# Patient Record
Sex: Male | Born: 1950 | Race: White | Hispanic: No | Marital: Married | Smoking: Never smoker
Health system: Southern US, Community
[De-identification: ages and names within clinical notes are randomized; demographics above are authoritative.]

## PROBLEM LIST (undated history)

## (undated) DIAGNOSIS — G4730 Sleep apnea, unspecified: Secondary | ICD-10-CM

## (undated) DIAGNOSIS — E119 Type 2 diabetes mellitus without complications: Secondary | ICD-10-CM

## (undated) DIAGNOSIS — I2510 Atherosclerotic heart disease of native coronary artery without angina pectoris: Secondary | ICD-10-CM

## (undated) DIAGNOSIS — I10 Essential (primary) hypertension: Secondary | ICD-10-CM

## (undated) DIAGNOSIS — K5790 Diverticulosis of intestine, part unspecified, without perforation or abscess without bleeding: Secondary | ICD-10-CM

## (undated) DIAGNOSIS — Z87442 Personal history of urinary calculi: Secondary | ICD-10-CM

## (undated) DIAGNOSIS — I6529 Occlusion and stenosis of unspecified carotid artery: Secondary | ICD-10-CM

## (undated) DIAGNOSIS — E785 Hyperlipidemia, unspecified: Secondary | ICD-10-CM

## (undated) DIAGNOSIS — K219 Gastro-esophageal reflux disease without esophagitis: Secondary | ICD-10-CM

## (undated) DIAGNOSIS — N2889 Other specified disorders of kidney and ureter: Secondary | ICD-10-CM

## (undated) HISTORY — DX: Type 2 diabetes mellitus without complications: E11.9

## (undated) HISTORY — DX: Occlusion and stenosis of unspecified carotid artery: I65.29

## (undated) HISTORY — PX: EYE SURGERY: SHX253

## (undated) HISTORY — DX: Gastro-esophageal reflux disease without esophagitis: K21.9

## (undated) HISTORY — PX: OTHER SURGICAL HISTORY: SHX169

## (undated) HISTORY — DX: Sleep apnea, unspecified: G47.30

## (undated) HISTORY — DX: Essential (primary) hypertension: I10

## (undated) HISTORY — PX: TONSILLECTOMY: SUR1361

## (undated) HISTORY — DX: Hyperlipidemia, unspecified: E78.5

---

## 2009-11-15 ENCOUNTER — Ambulatory Visit: Payer: Self-pay | Admitting: Specialist

## 2009-11-20 ENCOUNTER — Ambulatory Visit: Payer: Self-pay | Admitting: Family Medicine

## 2014-02-14 DIAGNOSIS — E1165 Type 2 diabetes mellitus with hyperglycemia: Secondary | ICD-10-CM | POA: Insufficient documentation

## 2014-02-14 DIAGNOSIS — IMO0002 Reserved for concepts with insufficient information to code with codable children: Secondary | ICD-10-CM | POA: Insufficient documentation

## 2014-02-14 DIAGNOSIS — E119 Type 2 diabetes mellitus without complications: Secondary | ICD-10-CM | POA: Insufficient documentation

## 2014-02-14 DIAGNOSIS — E785 Hyperlipidemia, unspecified: Secondary | ICD-10-CM | POA: Insufficient documentation

## 2014-02-14 DIAGNOSIS — I10 Essential (primary) hypertension: Secondary | ICD-10-CM | POA: Insufficient documentation

## 2016-05-07 ENCOUNTER — Other Ambulatory Visit: Payer: Self-pay | Admitting: Otolaryngology

## 2016-05-07 ENCOUNTER — Ambulatory Visit
Admission: RE | Admit: 2016-05-07 | Discharge: 2016-05-07 | Disposition: A | Discharge status: Home / Self Care | Payer: Self-pay | Source: Ambulatory Visit | Attending: Otolaryngology | Admitting: Otolaryngology

## 2016-05-07 DIAGNOSIS — R918 Other nonspecific abnormal finding of lung field: Secondary | ICD-10-CM | POA: Insufficient documentation

## 2016-05-07 DIAGNOSIS — R059 Cough, unspecified: Secondary | ICD-10-CM

## 2016-05-07 DIAGNOSIS — R05 Cough: Secondary | ICD-10-CM | POA: Insufficient documentation

## 2017-03-25 ENCOUNTER — Ambulatory Visit (INDEPENDENT_AMBULATORY_CARE_PROVIDER_SITE_OTHER): Payer: Self-pay | Admitting: Urology

## 2017-03-25 ENCOUNTER — Encounter: Payer: Self-pay | Admitting: Urology

## 2017-03-25 VITALS — BP 191/87 | HR 81 | Ht 68.0 in | Wt 174.0 lb

## 2017-03-25 DIAGNOSIS — N50811 Right testicular pain: Secondary | ICD-10-CM

## 2017-03-25 DIAGNOSIS — Z87442 Personal history of urinary calculi: Secondary | ICD-10-CM

## 2017-03-25 DIAGNOSIS — R109 Unspecified abdominal pain: Secondary | ICD-10-CM

## 2017-03-25 LAB — URINALYSIS, COMPLETE
Bilirubin, UA: NEGATIVE
Glucose, UA: NEGATIVE
Ketones, UA: NEGATIVE
LEUKOCYTES UA: NEGATIVE
Nitrite, UA: NEGATIVE
PH UA: 5.5 (ref 5.0–7.5)
PROTEIN UA: NEGATIVE
RBC, UA: NEGATIVE
Specific Gravity, UA: 1.015 (ref 1.005–1.030)
Urobilinogen, Ur: 0.2 mg/dL (ref 0.2–1.0)

## 2017-03-25 NOTE — Progress Notes (Signed)
03/25/2017 4:26 PM   Marcus Huang 12/28/1950 [id number]  Referring provider: Sofie Hartigan, MD [name], [name] [address]  Chief Complaint  Patient presents with  . Nephrolithiasis    New patient    HPI: 66 year old male who presents today with right flank pain. He is a personal history of nephrolithiasis and over the past 6 months, has spontaneously passed 2 stones.  He passed one small stone in January and another one in May. Since stone in May, he continued to have intermittent right lower quadrant pain, and right testicular discomfort which comes and goes. He takes may have another stone.  Prior to this, his last stone episode was approximately 10 years ago. His previously followed by a urologist but never required surgical intervention for stones.  He has no recent imaging available.  He denies any urinary symptoms including no dysuria, gross hematuria, frequency, or urgency. No fevers or chills.  Today, his discomfort is minimal.   PMH: Past Medical History:  Diagnosis Date  . Diabetes ([place])   . GERD (gastroesophageal reflux disease)   . Hypertension   . Sleep apnea     Surgical History: Past Surgical History:  Procedure Laterality Date  . none      Home Medications:  Allergies as of 03/25/2017   Not on File     Medication List       Accurate as of 03/25/17  4:26 PM. Always use your most recent med list.          aspirin EC 81 MG tablet Take by mouth.   traMADol 50 MG tablet Commonly known as:  ULTRAM Take by mouth.   valsartan-hydrochlorothiazide 160-12.5 MG tablet Commonly known as:  DIOVAN-HCT Take by mouth.       Allergies: Allergies not on file  Family History: Family History  Problem Relation Age of Onset  . Prostate cancer Neg Hx   . Bladder Cancer Neg Hx     Social History:  reports that he has never smoked. He has never used smokeless tobacco. He reports that he does not drink alcohol or use  drugs.  ROS: UROLOGY Frequent Urination?: No Hard to postpone urination?: No Burning/pain with urination?: No Get up at night to urinate?: No Leakage of urine?: No Urine stream starts and stops?: No Trouble starting stream?: No Do you have to strain to urinate?: No Blood in urine?: No Urinary tract infection?: No Sexually transmitted disease?: No Injury to kidneys or bladder?: Yes Painful intercourse?: No Weak stream?: No Erection problems?: No Penile pain?: No  Gastrointestinal Nausea?: No Vomiting?: No Indigestion/heartburn?: No Diarrhea?: No Constipation?: No  Constitutional Fever: No Night sweats?: No Weight loss?: No Fatigue?: No  Skin Skin rash/lesions?: No Itching?: No  Eyes Blurred vision?: No Double vision?: No  Ears/Nose/Throat Sore throat?: No Sinus problems?: No  Hematologic/Lymphatic Swollen glands?: No Easy bruising?: No  Cardiovascular Leg swelling?: No Chest pain?: No  Respiratory Cough?: No Shortness of breath?: No  Endocrine Excessive thirst?: No  Musculoskeletal Back pain?: No Joint pain?: No  Neurological Headaches?: No Dizziness?: No  Psychologic Depression?: No Anxiety?: No  Physical Exam: BP (!) 191/87   Pulse 81   Ht 5\' 8"  (1.727 m)   Wt 174 lb (78.9 kg)   BMI 26.46 kg/m   Constitutional:  Alert and oriented, No acute distress. HEENT: [place] AT, moist mucus membranes.  Trachea midline, no masses. Cardiovascular: No clubbing, cyanosis, or edema. Respiratory: Normal respiratory effort, no increased work  of breathing. GI: Abdomen is soft, nontender, nondistended, no abdominal masses GU: No CVA tenderness.  Normal circumcised phallus.  Bilateral descended testicles, no masses, nontender.   Skin: No rashes, bruises or suspicious lesions. Neurologic: Grossly intact, no focal deficits, moving all 4 extremities. Psychiatric: Normal mood and affect.  Laboratory Data:   Urinalysis Results for orders placed or  performed in visit on 03/25/17  Urinalysis, Complete  Result Value Ref Range   Specific Gravity, UA 1.015 1.005 - 1.030   pH, UA 5.5 5.0 - 7.5   Color, UA Yellow Yellow   Appearance Ur Clear Clear   Leukocytes, UA Negative Negative   Protein, UA Negative Negative/Trace   Glucose, UA Negative Negative   Ketones, UA Negative Negative   RBC, UA Negative Negative   Bilirubin, UA Negative Negative   Urobilinogen, Ur 0.2 0.2 - 1.0 mg/dL   Nitrite, UA Negative Negative   Pertinent Imaging: n/a  Assessment & Plan:    1. History of nephrolithiasis Personal history of nephrolithiasis as above - Urinalysis, Complete - CT RENAL STONE STUDY; Future  2. Right flank pain Continued right flank pain with history of nephrolithiasis Suspect possible retained right ureteral stone Recommend cross-sectional imaging in the form of CT stone protocol to assess for any obstructing calculi as well as overall stone burden He is agreeable to this plan, will return in 2 weeks for follow-up study  3. Right testicular pain pathology on exam today, right suspect referred pain   Return in about 2 weeks (around 04/08/2017) for f/u CT scan.  Hollice Espy, MD  [facility] [address] [place], [place] [address] [phone]

## 2017-04-06 ENCOUNTER — Ambulatory Visit
Admission: RE | Admit: 2017-04-06 | Discharge: 2017-04-06 | Disposition: A | Discharge status: Home / Self Care | Payer: Self-pay | Source: Ambulatory Visit | Attending: Urology | Admitting: Urology

## 2017-04-06 DIAGNOSIS — I700 Atherosclerosis of aorta: Secondary | ICD-10-CM | POA: Insufficient documentation

## 2017-04-06 DIAGNOSIS — N200 Calculus of kidney: Secondary | ICD-10-CM | POA: Insufficient documentation

## 2017-04-06 DIAGNOSIS — K5730 Diverticulosis of large intestine without perforation or abscess without bleeding: Secondary | ICD-10-CM | POA: Insufficient documentation

## 2017-04-06 DIAGNOSIS — Z87442 Personal history of urinary calculi: Secondary | ICD-10-CM

## 2017-04-07 ENCOUNTER — Other Ambulatory Visit: Payer: Self-pay | Admitting: Urology

## 2017-04-07 ENCOUNTER — Telehealth: Payer: Self-pay | Admitting: Urology

## 2017-04-07 DIAGNOSIS — K4030 Unilateral inguinal hernia, with obstruction, without gangrene, not specified as recurrent: Secondary | ICD-10-CM

## 2017-04-07 NOTE — Telephone Encounter (Signed)
Pt called office left message on VM stating that he recently had a kidney CT ordered by Dr. Erlene Quan, He has no follow up appt to go over results, Pt is asking to get results before he leaves to go out of town tomorrow due to not having cell service where he is going. Pt wants to know if he can get his results today please.  Please advise. Thanks

## 2017-04-08 ENCOUNTER — Telehealth: Payer: Self-pay

## 2017-04-08 ENCOUNTER — Ambulatory Visit: Payer: Self-pay

## 2017-04-08 ENCOUNTER — Telehealth: Payer: Self-pay | Admitting: Urology

## 2017-04-08 NOTE — Addendum Note (Signed)
Addended by: Hollice Espy on: 04/08/2017 01:32 PM   Modules accepted: Orders

## 2017-04-08 NOTE — Telephone Encounter (Signed)
Dr. Erlene Quan addressed patient's questions on original  telephone encounter. Thanks.

## 2017-04-08 NOTE — Telephone Encounter (Signed)
Pt called and wanted to know if CT results were back.  He is also having symptoms of kidney stones.  Please give pt a call.

## 2017-04-08 NOTE — Telephone Encounter (Signed)
Discuss symptoms again with the patient. He continues to have right sided abdominal pain radiating to his scrotum. On CT scan, there was bilateral inguinal fat-containing hernias. He was offered referral to Gen. surgery for further evaluation. He would like to pursue this. Order platelets today for referral.  Hollice Espy, MD

## 2017-04-08 NOTE — Telephone Encounter (Signed)
Left message on machine. Unfortunately, I was not able to reach the patient yesterday as I was in surgery all day. Findings on the CT scan were discussed, no obvious kidney pathology.  He was advised to call back if he has any questions or concerns.  Hollice Espy, MD

## 2017-04-08 NOTE — Telephone Encounter (Signed)
Called pt. No answer.  Left vmail per DPR.  

## 2017-04-08 NOTE — Telephone Encounter (Signed)
-----   Message from Hollice Espy, MD sent at 04/08/2017  9:27 AM EDT ----- Please review stone analysis with patient.  98% calcium oxalate monohydrate, 2% calcium phosphate.  Hollice Espy, MD

## 2017-04-13 DIAGNOSIS — I6529 Occlusion and stenosis of unspecified carotid artery: Secondary | ICD-10-CM

## 2017-04-15 ENCOUNTER — Ambulatory Visit: Payer: Self-pay

## 2017-04-16 ENCOUNTER — Encounter: Payer: Self-pay | Admitting: Surgery

## 2017-04-16 ENCOUNTER — Ambulatory Visit (INDEPENDENT_AMBULATORY_CARE_PROVIDER_SITE_OTHER): Payer: Self-pay | Admitting: Surgery

## 2017-04-16 VITALS — BP 163/103 | HR 83 | Temp 97.8°F | Ht 68.0 in | Wt 174.2 lb

## 2017-04-16 DIAGNOSIS — K4020 Bilateral inguinal hernia, without obstruction or gangrene, not specified as recurrent: Secondary | ICD-10-CM

## 2017-04-16 DIAGNOSIS — K429 Umbilical hernia without obstruction or gangrene: Secondary | ICD-10-CM

## 2017-04-16 NOTE — Progress Notes (Signed)
Marcus Huang is an 66 y.o. male.   Chief Complaint: Inguinal hernia HPI: Patient had a recent inguinal hernia identified on CT scan. He describes fullness in the suprapubic area and somewhat pressure-like sensation in both testicles he is not noticing any bulges does not describe severe pain. He has never known that he had a hernia before. He has known kidney stones and has had multiple CT scans in the past. He denies nausea vomiting fevers or chills.  Past Medical History:  Diagnosis Date  . Carotid artery thrombosis   . Diabetes ([place])   . GERD (gastroesophageal reflux disease)   . Hyperlipidemia   . Hypertension   . Sleep apnea     Past Surgical History:  Procedure Laterality Date  . none    . TONSILLECTOMY      Family History  Problem Relation Age of Onset  . Hypertension Father   . Stroke Father   . Coronary artery disease Maternal Grandmother   . Stroke Maternal Grandmother   . Diabetes Mother   . Hypertension Mother   . Stroke Mother   . Stroke Paternal Grandmother   . Prostate cancer Neg Hx   . Bladder Cancer Neg Hx    Social History:  reports that he has never smoked. He has never used smokeless tobacco. He reports that he does not drink alcohol or use drugs. 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  Allergies: Not on File   (Not in a hospital admission)   Review of Systems:   Review of Systems  Constitutional: Negative.   HENT: Negative.   Eyes: Negative.   Respiratory: Negative.   Cardiovascular: Negative.   Gastrointestinal: Positive for abdominal pain. Negative for diarrhea, nausea and vomiting.  Genitourinary: Negative.   Musculoskeletal: Negative.   Skin: Negative.   Neurological: Negative.   Endo/Heme/Allergies: Negative.   Psychiatric/Behavioral: Negative.     Physical Exam:  Physical Exam  Constitutional: He is oriented to person, place, and time and well-developed, well-nourished, and in no distress. No distress.   HENT:  Head: Normocephalic and atraumatic.  Eyes: Pupils are equal, round, and reactive to light. Right eye exhibits no discharge. Left eye exhibits no discharge. No scleral icterus.  Neck: Normal range of motion.  Cardiovascular: Normal rate, regular rhythm and normal heart sounds.   Pulmonary/Chest: Effort normal and breath sounds normal. No respiratory distress. He has no wheezes. He has no rales.  Abdominal: Soft. He exhibits no distension. There is no tenderness. There is no rebound and no guarding.  Small nonreducible umbilical hernia  Genitourinary: Penis normal.  Genitourinary Comments: Testicles normal Patient is examined supine and standing and with Valsalva. Bilateral inguinal hernias are noted.  Musculoskeletal: Normal range of motion. He exhibits no edema or tenderness.  Lymphadenopathy:    He has no cervical adenopathy.  Neurological: He is alert and oriented to person, place, and time.  Skin: Skin is warm and dry. No rash noted. He is not diaphoretic. No erythema.  Psychiatric: Mood and affect normal.  Vitals reviewed.   Blood pressure (!) 163/103, pulse 83, temperature 97.8 F (36.6 C), temperature source Oral, height 5\' 8"  (1.727 m), weight 174 lb 3.2 oz (79 kg).    No results found for this or any previous visit (from the past 48 hour(s)). No results found.   Assessment/Plan CT confirms presence of bilateral inguinal hernias and an umbilical hernia.  Patient is overtly hypertensive at this time 163/103  Patient will require laparoscopic  preperitoneal repair of bilateral inguinal hernias for his symptoms and a concomitant umbilical herniorrhaphy. Rationale for offering this surgery was discussed with him as was the options of observation. His hypertension will need to be better controlled. We discussed the risk of bleeding infection conversion to an open procedure ischemic orchitis chronic pain syndrome neuroma and recurrence he understood and agreed with this  plan. Patient will see his primary care physician for adjustments of his medications and then plan surgery.  Florene Glen, MD, FACS

## 2017-04-16 NOTE — Patient Instructions (Addendum)
We have scheduled you for a Laparoscopic Bilateral Inguinal Herina and Umbilical Hernia Repair with Dr. Burt Knack on 05/12/17. Please see your blue sheet for further instructions.  We advised you to see your Primary Care Provider to help get your blood pressure down prior to surgery.

## 2017-04-17 ENCOUNTER — Encounter: Payer: Self-pay | Admitting: Surgery

## 2017-04-17 ENCOUNTER — Telehealth: Payer: Self-pay | Admitting: Surgery

## 2017-04-17 NOTE — Telephone Encounter (Signed)
I have called Patient to advise him of pre op date/time and sx date. No answer. When patient calls back please inform him of information below.   Sx: 05/12/17 with Dr Maeola Sarah bilateral inguinal hernia repair and umbilical hernia repair.  Pre op: 05/01/17 @ 9:45am--Office.   Patient made aware to call [phone], between 1-3:00pm the day before surgery, to find out what time to arrive.

## 2017-04-20 ENCOUNTER — Telehealth: Payer: Self-pay

## 2017-04-20 NOTE — Telephone Encounter (Signed)
Patient has called and all information has been given.   Patient has been advised of the 500.00 deposit for surgery. Patient will call and make payment prior to surgery.

## 2017-04-20 NOTE — Telephone Encounter (Signed)
Vascular Clearance faxed to Dr.Schnier at this time.

## 2017-04-25 ENCOUNTER — Encounter: Payer: Self-pay | Admitting: Surgery

## 2017-04-28 ENCOUNTER — Other Ambulatory Visit (INDEPENDENT_AMBULATORY_CARE_PROVIDER_SITE_OTHER): Payer: Self-pay | Admitting: Vascular Surgery

## 2017-04-28 ENCOUNTER — Ambulatory Visit (INDEPENDENT_AMBULATORY_CARE_PROVIDER_SITE_OTHER): Payer: Self-pay

## 2017-04-28 DIAGNOSIS — I6523 Occlusion and stenosis of bilateral carotid arteries: Secondary | ICD-10-CM

## 2017-05-01 ENCOUNTER — Telehealth (INDEPENDENT_AMBULATORY_CARE_PROVIDER_SITE_OTHER): Payer: Self-pay

## 2017-05-01 ENCOUNTER — Encounter
Admission: RE | Admit: 2017-05-01 | Discharge: 2017-05-01 | Disposition: A | Discharge status: Home / Self Care | Payer: Self-pay | Source: Ambulatory Visit | Attending: Surgery | Admitting: Surgery

## 2017-05-01 DIAGNOSIS — Z01812 Encounter for preprocedural laboratory examination: Secondary | ICD-10-CM | POA: Insufficient documentation

## 2017-05-01 DIAGNOSIS — Z01810 Encounter for preprocedural cardiovascular examination: Secondary | ICD-10-CM | POA: Insufficient documentation

## 2017-05-01 DIAGNOSIS — I10 Essential (primary) hypertension: Secondary | ICD-10-CM | POA: Insufficient documentation

## 2017-05-01 HISTORY — DX: Diverticulosis of intestine, part unspecified, without perforation or abscess without bleeding: K57.90

## 2017-05-01 HISTORY — DX: Personal history of urinary calculi: Z87.442

## 2017-05-01 LAB — COMPREHENSIVE METABOLIC PANEL
ALBUMIN: 4 g/dL (ref 3.5–5.0)
ALT: 15 U/L — AB (ref 17–63)
AST: 17 U/L (ref 15–41)
Alkaline Phosphatase: 54 U/L (ref 38–126)
Anion gap: 8 (ref 5–15)
BUN: 22 mg/dL — AB (ref 6–20)
CHLORIDE: 102 mmol/L (ref 101–111)
CO2: 30 mmol/L (ref 22–32)
CREATININE: 0.95 mg/dL (ref 0.61–1.24)
Calcium: 9.2 mg/dL (ref 8.9–10.3)
GFR calc Af Amer: >60 mL/min (ref >60)
GFR calc non Af Amer: >60 mL/min (ref >60)
GLUCOSE: 93 mg/dL (ref 65–99)
Potassium: 3.9 mmol/L (ref 3.5–5.1)
SODIUM: 140 mmol/L (ref 135–145)
Total Bilirubin: 0.7 mg/dL (ref 0.3–1.2)
Total Protein: 7 g/dL (ref 6.5–8.1)

## 2017-05-01 LAB — CBC WITH DIFFERENTIAL/PLATELET
BASOS ABS: 0 10*3/uL (ref 0–0.1)
Basophils Relative: 1 %
EOS ABS: 0.2 10*3/uL (ref 0–0.7)
EOS PCT: 3 %
HCT: 39.3 % — ABNORMAL LOW (ref 40.0–52.0)
Hemoglobin: 13.7 g/dL (ref 13.0–18.0)
LYMPHS PCT: 28 %
Lymphs Abs: 1.8 10*3/uL (ref 1.0–3.6)
MCH: 31 pg (ref 26.0–34.0)
MCHC: 34.9 g/dL (ref 32.0–36.0)
MCV: 88.7 fL (ref 80.0–100.0)
Monocytes Absolute: 0.5 10*3/uL (ref 0.2–1.0)
Monocytes Relative: 8 %
Neutro Abs: 3.7 10*3/uL (ref 1.4–6.5)
Neutrophils Relative %: 60 %
PLATELETS: 166 10*3/uL (ref 150–440)
RBC: 4.43 MIL/uL (ref 4.40–5.90)
RDW: 13.5 % (ref 11.5–14.5)
WBC: 6.2 10*3/uL (ref 3.8–10.6)

## 2017-05-01 NOTE — Patient Instructions (Signed)
Your procedure is scheduled on: May 12, 2017 [facility]) Report to Same Day Surgery 2nd floor medical mall [facility] Entrance-take elevator on left to 2nd floor.  Check in with surgery information desk.) To find out your arrival time please call [phone] between 1PM - 3PM on May 11, 2017 [facility] ) Remember: Instructions that are not followed completely may result in serious medical risk, up to and including death, or upon the discretion of your surgeon and anesthesiologist your surgery may need to be rescheduled.    _x___ 1. Do not eat food or drink liquids after midnight. No gum chewing or hard candies                                  __x__ 2. No Alcohol for 24 hours before or after surgery.   __x__3. No Smoking for 24 prior to surgery.   ____  4. Bring all medications with you on the day of surgery if instructed.    __x__ 5. Notify your doctor if there is any change in your medical condition     (cold, fever, infections).     Do not wear jewelry, make-up, hairpins, clips or nail polish.  Do not wear lotions, powders, or perfumes.   Do not shave 48 hours prior to surgery. Men may shave face and neck.  Do not bring valuables to the hospital.    [facility] is not responsible for any belongings or valuables.               Contacts, dentures or bridgework may not be worn into surgery.  Leave your suitcase in the car. After surgery it may be brought to your room.  For patients admitted to the hospital, discharge time is determined by your treatment team                    Patients discharged the day of surgery will not be allowed to drive home.  You will need someone to drive you home and stay with you the night of your procedure.    Please read over the following fact sheets that you were given:   [facility] Preparing for Surgery and or MRSA Information   ___ Take anti-hypertensive (unless it includes a diuretic), cardiac, seizure, asthma,     anti-reflux and psychiatric  medicines. These include:  1.   2.  3.  4.  5.  6.  ____Fleets enema or Magnesium Citrate as directed.   _x___ Use CHG Soap or sage wipes as directed on instruction sheet   ____ Use inhalers on the day of surgery and bring to hospital day of surgery  ____ Stop Metformin and Janumet 2 days prior to surgery.    ____ Take 1/2 of usual insulin dose the night before surgery and none on the morning surgery     _x___ Follow recommendations from Cardiologist, Pulmonologist or PCP regarding          stopping Aspirin, Coumadin, Plavix ,Eliquis, Effient, or Pradaxa, and Pletal. (STOP ASPIRIN ONE WEEK PRIOR TO SURGERY )  X____Stop Anti-inflammatories such as Advil, Aleve, Ibuprofen, Motrin, Naproxen, Naprosyn, Goodies powders or aspirin products. OK to take Tylenol OR TRAMADOL FOR PAIN IF NEEDED )   _x___ Stop supplements until after surgery.  But may continue Vitamin D, Vitamin B,and multivitamin  (PATIENT HAS STOPPED SUPPLEMENTS )   .   ____ Bring C-Pap to the hospital. (  NOT CURRENTLY USING C-PAP )

## 2017-05-01 NOTE — Telephone Encounter (Signed)
Patient called asking about his ultrasound results and I left a voicemail informing him that his results will be mail and if the doctor need him to come in we will get in touch to make a appointment

## 2017-05-05 ENCOUNTER — Telehealth: Payer: Self-pay

## 2017-05-06 NOTE — Telephone Encounter (Signed)
Medical clearance obtain and approved. Clearance can be found under media tab.

## 2017-05-12 ENCOUNTER — Ambulatory Visit: Payer: Self-pay | Admitting: Anesthesiology

## 2017-05-12 ENCOUNTER — Ambulatory Visit
Admission: RE | Admit: 2017-05-12 | Discharge: 2017-05-12 | Disposition: A | Discharge status: Home / Self Care | Payer: Self-pay | Source: Ambulatory Visit | Attending: Surgery | Admitting: Surgery

## 2017-05-12 ENCOUNTER — Encounter: Payer: Self-pay | Admitting: *Deleted

## 2017-05-12 ENCOUNTER — Encounter
Admission: RE | Disposition: A | Discharge status: Home / Self Care | Payer: Self-pay | Source: Ambulatory Visit | Attending: Surgery

## 2017-05-12 DIAGNOSIS — Z79899 Other long term (current) drug therapy: Secondary | ICD-10-CM | POA: Insufficient documentation

## 2017-05-12 DIAGNOSIS — K219 Gastro-esophageal reflux disease without esophagitis: Secondary | ICD-10-CM | POA: Insufficient documentation

## 2017-05-12 DIAGNOSIS — K429 Umbilical hernia without obstruction or gangrene: Secondary | ICD-10-CM

## 2017-05-12 DIAGNOSIS — G4730 Sleep apnea, unspecified: Secondary | ICD-10-CM | POA: Insufficient documentation

## 2017-05-12 DIAGNOSIS — K4020 Bilateral inguinal hernia, without obstruction or gangrene, not specified as recurrent: Secondary | ICD-10-CM

## 2017-05-12 DIAGNOSIS — Z7982 Long term (current) use of aspirin: Secondary | ICD-10-CM | POA: Insufficient documentation

## 2017-05-12 DIAGNOSIS — E1151 Type 2 diabetes mellitus with diabetic peripheral angiopathy without gangrene: Secondary | ICD-10-CM | POA: Insufficient documentation

## 2017-05-12 DIAGNOSIS — I10 Essential (primary) hypertension: Secondary | ICD-10-CM | POA: Insufficient documentation

## 2017-05-12 DIAGNOSIS — E785 Hyperlipidemia, unspecified: Secondary | ICD-10-CM | POA: Insufficient documentation

## 2017-05-12 HISTORY — PX: INGUINAL HERNIA REPAIR: SHX194

## 2017-05-12 LAB — GLUCOSE, CAPILLARY
Glucose-Capillary: 81 mg/dL (ref 65–99)
Glucose-Capillary: 95 mg/dL (ref 65–99)

## 2017-05-12 SURGERY — REPAIR, HERNIA, INGUINAL, BILATERAL, LAPAROSCOPIC
Anesthesia: General | Laterality: Bilateral | Wound class: Clean

## 2017-05-12 MED ORDER — SUGAMMADEX SODIUM 200 MG/2ML IV SOLN
INTRAVENOUS | Status: DC | PRN
  Administered 2017-05-12: 151.6 mg via INTRAVENOUS

## 2017-05-12 MED ORDER — ONDANSETRON HCL 4 MG/2ML IJ SOLN: 4.0000 mg | Freq: Once | INTRAMUSCULAR | Status: DC | PRN

## 2017-05-12 MED ORDER — ACETAMINOPHEN 10 MG/ML IV SOLN
INTRAVENOUS | Status: DC | PRN
  Administered 2017-05-12: 1000 mg via INTRAVENOUS

## 2017-05-12 MED ORDER — LACTATED RINGERS IV SOLN
INTRAVENOUS | Status: DC
  Administered 2017-05-12: 07:00:00 via INTRAVENOUS

## 2017-05-12 MED ORDER — HYDROCODONE-ACETAMINOPHEN 5-300 MG PO TABS: 1.0000 | ORAL_TABLET | ORAL | 0 refills | Status: DC | PRN

## 2017-05-12 MED ORDER — SODIUM CHLORIDE 0.9 % IJ SOLN
INTRAMUSCULAR | Status: AC
  Filled 2017-05-12: qty 10

## 2017-05-12 MED ORDER — ACETAMINOPHEN 10 MG/ML IV SOLN
INTRAVENOUS | Status: AC
  Filled 2017-05-12: qty 100

## 2017-05-12 MED ORDER — ROCURONIUM BROMIDE 50 MG/5ML IV SOLN
INTRAVENOUS | Status: AC
  Filled 2017-05-12: qty 1

## 2017-05-12 MED ORDER — FENTANYL CITRATE (PF) 100 MCG/2ML IJ SOLN
INTRAMUSCULAR | Status: AC
  Filled 2017-05-12: qty 2

## 2017-05-12 MED ORDER — PROPOFOL 10 MG/ML IV BOLUS
INTRAVENOUS | Status: AC
  Filled 2017-05-12: qty 20

## 2017-05-12 MED ORDER — BUPIVACAINE-EPINEPHRINE (PF) 0.25% -1:200000 IJ SOLN
INTRAMUSCULAR | Status: AC
  Filled 2017-05-12: qty 30

## 2017-05-12 MED ORDER — LIDOCAINE HCL (CARDIAC) 20 MG/ML IV SOLN
INTRAVENOUS | Status: DC | PRN
  Administered 2017-05-12: 50 mg via INTRAVENOUS

## 2017-05-12 MED ORDER — PROPOFOL 10 MG/ML IV BOLUS
INTRAVENOUS | Status: DC | PRN
  Administered 2017-05-12: 150 mg via INTRAVENOUS

## 2017-05-12 MED ORDER — SUGAMMADEX SODIUM 200 MG/2ML IV SOLN
INTRAVENOUS | Status: AC
  Filled 2017-05-12: qty 2

## 2017-05-12 MED ORDER — CHLORHEXIDINE GLUCONATE CLOTH 2 % EX PADS: 6.0000 | MEDICATED_PAD | Freq: Once | CUTANEOUS | Status: DC

## 2017-05-12 MED ORDER — FENTANYL CITRATE (PF) 100 MCG/2ML IJ SOLN
INTRAMUSCULAR | Status: DC | PRN
  Administered 2017-05-12: 100 ug via INTRAVENOUS
  Administered 2017-05-12: 50 ug via INTRAVENOUS

## 2017-05-12 MED ORDER — GLYCOPYRROLATE 0.2 MG/ML IJ SOLN
INTRAMUSCULAR | Status: AC
  Filled 2017-05-12: qty 1

## 2017-05-12 MED ORDER — HYDROCODONE-ACETAMINOPHEN 5-325 MG PO TABS
1.0000 | ORAL_TABLET | ORAL | Status: DC | PRN
  Administered 2017-05-12: 1 via ORAL

## 2017-05-12 MED ORDER — FENTANYL CITRATE (PF) 100 MCG/2ML IJ SOLN
25.0000 ug | INTRAMUSCULAR | Status: DC | PRN
  Administered 2017-05-12 (×4): 25 ug via INTRAVENOUS

## 2017-05-12 MED ORDER — ONDANSETRON HCL 4 MG/2ML IJ SOLN
INTRAMUSCULAR | Status: AC
  Filled 2017-05-12: qty 2

## 2017-05-12 MED ORDER — FAMOTIDINE 20 MG PO TABS
20.0000 mg | ORAL_TABLET | Freq: Once | ORAL | Status: AC
  Administered 2017-05-12: 20 mg via ORAL

## 2017-05-12 MED ORDER — HEPARIN SODIUM (PORCINE) 5000 UNIT/ML IJ SOLN
5000.0000 [IU] | Freq: Once | INTRAMUSCULAR | Status: AC
  Administered 2017-05-12: 5000 [IU] via SUBCUTANEOUS

## 2017-05-12 MED ORDER — ROCURONIUM BROMIDE 100 MG/10ML IV SOLN
INTRAVENOUS | Status: DC | PRN
  Administered 2017-05-12: 30 mg via INTRAVENOUS
  Administered 2017-05-12: 5 mg via INTRAVENOUS
  Administered 2017-05-12: 10 mg via INTRAVENOUS

## 2017-05-12 MED ORDER — SUCCINYLCHOLINE CHLORIDE 20 MG/ML IJ SOLN
INTRAMUSCULAR | Status: AC
  Filled 2017-05-12: qty 1

## 2017-05-12 MED ORDER — PHENYLEPHRINE HCL 10 MG/ML IJ SOLN
INTRAMUSCULAR | Status: AC
  Filled 2017-05-12: qty 1

## 2017-05-12 MED ORDER — ONDANSETRON HCL 4 MG/2ML IJ SOLN
INTRAMUSCULAR | Status: DC | PRN
  Administered 2017-05-12: 4 mg via INTRAVENOUS

## 2017-05-12 MED ORDER — CEFAZOLIN SODIUM-DEXTROSE 2-4 GM/100ML-% IV SOLN
INTRAVENOUS | Status: AC
  Filled 2017-05-12: qty 100

## 2017-05-12 MED ORDER — BUPIVACAINE-EPINEPHRINE (PF) 0.25% -1:200000 IJ SOLN
INTRAMUSCULAR | Status: DC | PRN
  Administered 2017-05-12: 30 mL via PERINEURAL

## 2017-05-12 MED ORDER — HEPARIN SODIUM (PORCINE) 5000 UNIT/ML IJ SOLN
INTRAMUSCULAR | Status: AC
  Filled 2017-05-12: qty 1

## 2017-05-12 MED ORDER — LIDOCAINE HCL (PF) 2 % IJ SOLN
INTRAMUSCULAR | Status: AC
  Filled 2017-05-12: qty 2

## 2017-05-12 MED ORDER — MIDAZOLAM HCL 2 MG/2ML IJ SOLN
INTRAMUSCULAR | Status: DC | PRN
  Administered 2017-05-12: 2 mg via INTRAVENOUS

## 2017-05-12 MED ORDER — EPHEDRINE SULFATE 50 MG/ML IJ SOLN
INTRAMUSCULAR | Status: AC
  Filled 2017-05-12: qty 1

## 2017-05-12 MED ORDER — HYDROCODONE-ACETAMINOPHEN 5-325 MG PO TABS
ORAL_TABLET | ORAL | Status: AC
  Filled 2017-05-12: qty 1

## 2017-05-12 MED ORDER — SUCCINYLCHOLINE CHLORIDE 20 MG/ML IJ SOLN
INTRAMUSCULAR | Status: DC | PRN
  Administered 2017-05-12: 100 mg via INTRAVENOUS

## 2017-05-12 MED ORDER — CEFAZOLIN SODIUM-DEXTROSE 2-4 GM/100ML-% IV SOLN
2.0000 g | INTRAVENOUS | Status: AC
  Administered 2017-05-12: 2 g via INTRAVENOUS

## 2017-05-12 MED ORDER — MIDAZOLAM HCL 2 MG/2ML IJ SOLN
INTRAMUSCULAR | Status: AC
  Filled 2017-05-12: qty 2

## 2017-05-12 MED ORDER — FENTANYL CITRATE (PF) 100 MCG/2ML IJ SOLN
INTRAMUSCULAR | Status: AC
  Administered 2017-05-12: 25 ug via INTRAVENOUS
  Filled 2017-05-12: qty 2

## 2017-05-12 MED ORDER — GLYCOPYRROLATE 0.2 MG/ML IJ SOLN
INTRAMUSCULAR | Status: DC | PRN
  Administered 2017-05-12: 0.2 mg via INTRAVENOUS

## 2017-05-12 MED ORDER — FAMOTIDINE 20 MG PO TABS
ORAL_TABLET | ORAL | Status: AC
  Filled 2017-05-12: qty 1

## 2017-05-12 SURGICAL SUPPLY — 37 items
ADHESIVE MASTISOL STRL (MISCELLANEOUS) ×3 IMPLANT
BLADE SURG SZ11 CARB STEEL (BLADE) ×3 IMPLANT
CHLORAPREP W/TINT 26ML (MISCELLANEOUS) ×3 IMPLANT
CLOSURE WOUND 1/2 X4 (GAUZE/BANDAGES/DRESSINGS) ×1
DISSECT BALLN SPACEMKR OVL PDB (BALLOONS)
DISSECT BALLN SPACEMKR RND PDB (MISCELLANEOUS)
DISSECTOR BALLN SPCMKR OVL PDB (BALLOONS) IMPLANT
DISSECTOR BALLN SPCMKR RND PDB (MISCELLANEOUS) IMPLANT
GAUZE SPONGE NON-WVN 2X2 STRL (MISCELLANEOUS) ×1 IMPLANT
GLOVE BIO SURGEON STRL SZ8 (GLOVE) ×3 IMPLANT
GOWN STRL REUS W/ TWL LRG LVL3 (GOWN DISPOSABLE) ×2 IMPLANT
GOWN STRL REUS W/TWL LRG LVL3 (GOWN DISPOSABLE) ×4
IRRIGATION STRYKERFLOW (MISCELLANEOUS) IMPLANT
IRRIGATOR STRYKERFLOW (MISCELLANEOUS)
LABEL OR SOLS (LABEL) ×3 IMPLANT
MESH HERNIA 4X6 PROLITE RECT (Mesh General) ×2 IMPLANT
MESH POLY 4X6 (Mesh General) ×4 IMPLANT
NDL SAFETY 22GX1.5 (NEEDLE) ×3 IMPLANT
NS IRRIG 500ML POUR BTL (IV SOLUTION) ×3 IMPLANT
PACK LAP CHOLECYSTECTOMY (MISCELLANEOUS) ×3 IMPLANT
PENCIL ELECTRO HAND CTR (MISCELLANEOUS) ×3 IMPLANT
SPONGE LAP 18X18 5 PK (GAUZE/BANDAGES/DRESSINGS) ×3 IMPLANT
SPONGE VERSALON 2X2 STRL (MISCELLANEOUS) ×2
STRIP CLOSURE SKIN 1/2X4 (GAUZE/BANDAGES/DRESSINGS) ×2 IMPLANT
SURGILUBE 2OZ TUBE FLIPTOP (MISCELLANEOUS) ×3 IMPLANT
SUT ETHIBOND 0 (SUTURE) ×3 IMPLANT
SUT ETHIBOND CT1 BRD #0 30IN (SUTURE) ×6 IMPLANT
SUT MNCRL 4-0 (SUTURE) ×4
SUT MNCRL 4-0 27XMFL (SUTURE) ×2
SUT VICRYL 0 AB UR-6 (SUTURE) ×3 IMPLANT
SUTURE MNCRL 4-0 27XMF (SUTURE) ×2 IMPLANT
TACKER 5MM HERNIA 3.5CML NAB (ENDOMECHANICALS) ×3 IMPLANT
TRAY FOLEY W/METER SILVER 16FR (SET/KITS/TRAYS/PACK) ×3 IMPLANT
TROCAR 5MM SINGLE VERSAONE (TROCAR) ×6 IMPLANT
TROCAR BALLN 10M OMST10SB SPAC (TROCAR) IMPLANT
TUBING INSUFFLATOR HI FLOW (MISCELLANEOUS) ×3 IMPLANT
WATER STERILE IRR 1000ML POUR (IV SOLUTION) ×3 IMPLANT

## 2017-05-12 NOTE — Anesthesia Postprocedure Evaluation (Signed)
Anesthesia Post Note  Patient: Marcus Huang  Procedure(s) Performed: Procedure(s) (LRB): LAPAROSCOPIC BILATERAL INGUINAL HERNIA REPAIR, umbilical hernia repair (Bilateral)  Patient location during evaluation: PACU Anesthesia Type: General Level of consciousness: awake and alert and oriented Pain management: pain level controlled Vital Signs Assessment: post-procedure vital signs reviewed and stable Respiratory status: spontaneous breathing Cardiovascular status: blood pressure returned to baseline Anesthetic complications: no     Last Vitals:  Vitals:   05/12/17 0923 05/12/17 1027  BP: 135/64 139/74  Pulse: (!) 59 (!) 59  Resp: 20 16  Temp: 36.5 C   SpO2: 97% 98%    Last Pain:  Vitals:   05/12/17 1027  TempSrc:   PainSc: 4                  Major [place]

## 2017-05-12 NOTE — Op Note (Signed)
Laparoscopic Inguinal Hernia Repair      Pre-operative Diagnosis: Bilateral Inguinal Hernia, umbilical hernia  Post-operative Diagnosis: Bilateral  Inguinal hernia, umbilical hernia  Procedure: Laparoscopic preperitoneal repair of bilateral inguinal hernia, and the umbilical hernia repair   Surgeon: Jerrol Banana. Burt Knack, MD FACS  Anesthesia: Gen. with endotracheal tube  Assistant: Surgical tech  Procedure Details  The patient was seen again in the Holding Room. The benefits, complications, treatment options, and expected outcomes were discussed with the patient. The risks of bleeding, infection, recurrence of symptoms, failure to resolve symptoms, recurrence of hernia, ischemic orchitis, chronic pain syndrome or neuroma, were discussed again. The likelihood of improving the patient's symptoms with return to their baseline status is good.  The patient and/or family concurred with the proposed plan, giving informed consent.  The patient was taken to Operating Room, identified and the procedure verified as Laparoscopic bilateral Inguinal Hernia Repair and umbilical hernia repair.   A Time Out was held and the above information confirmed.  Prior to the induction of general anesthesia, antibiotic prophylaxis was administered. VTE prophylaxis was in place. General endotracheal anesthesia was then administered and tolerated well. A Foley catheter was placed by the nursing staff. After the induction, the abdomen was prepped with Chloraprep and draped in the sterile fashion. The patient was positioned in the supine position.  Local anesthetic  was injected into the skin near the umbilicus and an incision made. The umbilical hernia was identified and dissected free and the fascial edges were cleaned. Figure-of-eight 0 Ethibonds were utilized to close the umbilical hernia.  An incision was made and dissection down to the right rectus fascia was performed. The fascia was incised and the muscle retracted  laterally. The Covidien dissecting balloon was placed followed by the structural balloon. The preperitoneal space was insufflated and under direct vision 2 midline 5 mm ports were placed.  Dissection was performed to delineate Mieko Kneebone's ligament and the lateral extent of dissection was determined. The nerve on the lateral abdominal wall was identified and kept in view at all times. The cord was skeletonized of the indirect sac and cord lipoma which was retracted cephalad. On the left side was a huge indirect sac which was dissected off of the cord structures and retracted cephalad. On the right was a small indirect sac as well. The floors were fairly strong.  Once this was complete, a laterally scissored Atrium mesh was placed into the preperitoneal space on each side. It was held in place with the Covidien tacking device avoiding the area of the nerve. Once assuring that the hernia was completely repaired and adequately covered, the preperitoneal space was desufflated under direct vision. There was no sign of peritoneal rent and no sign of bowel intrusion towards the   Once assuring that hemostasis was adequate the ports were removed and a figure-of-eight 0 Vicryl suture was placed at the fascial edges. The skin of the umbilicus was tacked to the fascia with figure-of-eight 30 Vicryls. Deep sutures of 30 Vicryls were placed. 4-0 subcuticular Monocryl was used at all skin edges. Steri-Strips and Mastisol and sterile dressings were placed.  Patient tolerated the procedure well. There were no complications. He was taken to the recovery room in stable condition to be discharged to the care of his family and follow-up in 10 days.    Findings: Bilateral inguinal hernias and small sub-centimeter umbilical hernia.  Chelsea Pedretti E. Burt Knack, MD, FACS

## 2017-05-12 NOTE — Anesthesia Preprocedure Evaluation (Signed)
Anesthesia Evaluation  Patient identified by MRN, date of birth, ID band Patient awake    Reviewed: Allergy & Precautions, NPO status , Patient's Chart, lab work & pertinent test results  Airway Mallampati: II  TM Distance: >3 FB     Dental  (+) Teeth Intact   Pulmonary sleep apnea ,    Pulmonary exam normal        Cardiovascular hypertension, Pt. on medications + Peripheral Vascular Disease  Normal cardiovascular exam     Neuro/Psych negative neurological ROS  negative psych ROS   GI/Hepatic Neg liver ROS, GERD  Medicated,  Endo/Other  diabetes, Well Controlled, Type 2  Renal/GU negative Renal ROS  negative genitourinary   Musculoskeletal negative musculoskeletal ROS (+)   Abdominal Normal abdominal exam  (+)   Peds negative pediatric ROS (+)  Hematology negative hematology ROS (+)   Anesthesia Other Findings   Reproductive/Obstetrics                             Anesthesia Physical Anesthesia Plan  ASA: III  Anesthesia Plan: General   Post-op Pain Management:    Induction: Intravenous  PONV Risk Score and Plan: 3 and Ondansetron, Dexamethasone, Midazolam and Propofol infusion  Airway Management Planned: Oral ETT  Additional Equipment:   Intra-op Plan:   Post-operative Plan: Extubation in OR  Informed Consent: I have reviewed the patients History and Physical, chart, labs and discussed the procedure including the risks, benefits and alternatives for the proposed anesthesia with the patient or authorized representative who has indicated his/her understanding and acceptance.   Dental advisory given  Plan Discussed with: CRNA and Surgeon  Anesthesia Plan Comments:         Anesthesia Quick Evaluation

## 2017-05-12 NOTE — Anesthesia Post-op Follow-up Note (Signed)
Anesthesia QCDR form completed.        

## 2017-05-12 NOTE — Progress Notes (Signed)
Preoperative Review   Patient is met in the preoperative holding area. The history is reviewed in the chart and with the patient. I personally reviewed the options and rationale as well as the risks of this procedure that have been previously discussed with the patient. All questions asked by the patient and/or family were answered to their satisfaction.  Patient agrees to proceed with this procedure at this time.  Meklit Cotta E Tyler Robidoux M.D. FACS  

## 2017-05-12 NOTE — Anesthesia Procedure Notes (Signed)
Procedure Name: Intubation Performed by: Lance Muss Pre-anesthesia Checklist: Patient identified, Patient being monitored, Timeout performed, Emergency Drugs available and Suction available Patient Re-evaluated:Patient Re-evaluated prior to induction Oxygen Delivery Method: Circle system utilized Preoxygenation: Pre-oxygenation with 100% oxygen Induction Type: IV induction Ventilation: Mask ventilation without difficulty Laryngoscope Size: Mac and 3 Grade View: Grade I Tube type: Oral Tube size: 7.5 mm Number of attempts: 1 Airway Equipment and Method: Stylet Placement Confirmation: ETT inserted through vocal cords under direct vision,  positive ETCO2 and breath sounds checked- equal and bilateral Secured at: 21 cm Tube secured with: Tape Dental Injury: Teeth and Oropharynx as per pre-operative assessment

## 2017-05-12 NOTE — Discharge Instructions (Signed)
AMBULATORY SURGERY  DISCHARGE INSTRUCTIONS   1) The drugs that you were given will stay in your system until tomorrow so for the next 24 hours you should not:  A) Drive an automobile B) Make any legal decisions C) Drink any alcoholic beverage   2) You may resume regular meals tomorrow.  Today it is better to start with liquids and gradually work up to solid foods.  You may eat anything you prefer, but it is better to start with liquids, then soup and crackers, and gradually work up to solid foods.   3) Please notify your doctor immediately if you have any unusual bleeding, trouble breathing, redness and pain at the surgery site, drainage, fever, or pain not relieved by medication. 4)   5) Additional Instructions:   ice for comfort    Remove dressing in 24 hours. May shower in 24 hours. Leave paper strips in place. Resume all home medications. Follow-up with Dr. Burt Knack in 10 days.

## 2017-05-12 NOTE — Transfer of Care (Signed)
Immediate Anesthesia Transfer of Care Note  Patient: Marcus Huang  Procedure(s) Performed: Procedure(s): LAPAROSCOPIC BILATERAL INGUINAL HERNIA REPAIR, umbilical hernia repair (Bilateral)  Patient Location: PACU  Anesthesia Type:General  Level of Consciousness: sedated and responds to stimulation  Airway & Oxygen Therapy: Patient Spontanous Breathing and Patient connected to face mask oxygen  Post-op Assessment: Report given to RN and Post -op Vital signs reviewed and stable  Post vital signs: Reviewed and stable  Last Vitals:  Vitals:   05/12/17 0616 05/12/17 0831  BP: (!) 170/96 (!) 158/84  Pulse: 74 82  Resp: 16 14  Temp: (!) 36.1 C   SpO2: 100% 100%    Last Pain:  Vitals:   05/12/17 0616  TempSrc: Tympanic  PainSc: 2          Complications: No apparent anesthesia complications

## 2017-05-13 ENCOUNTER — Telehealth: Payer: Self-pay

## 2017-05-13 ENCOUNTER — Encounter: Payer: Self-pay | Admitting: Surgery

## 2017-05-13 NOTE — Telephone Encounter (Signed)
Post-op call made to patient at this time. Spoke with patient. Post-op interview questions below.  1. How are you feeling?good 2. Is your pain controlled? yes  3. What are you doing for the pain? tramadol  4. Are you having any Nausea or Vomiting? no  5. Are you having any Fever or Chills? no  6. Are you having any Constipation or Diarrhea? constipation  7. Is there any Swelling or Bruising you are concerned about? no  8. Do you have any questions or concerns at this time? Discomfort when urinating due to catheter.    Discussion: Instructed to drink plenty of water to flush ureter, If no better or develops increase pain call office. Follow up 05/22/17 @ 9:30 with Dr.Cooper.

## 2017-05-22 ENCOUNTER — Ambulatory Visit (INDEPENDENT_AMBULATORY_CARE_PROVIDER_SITE_OTHER): Payer: Self-pay | Admitting: Surgery

## 2017-05-22 ENCOUNTER — Encounter: Payer: Self-pay | Admitting: Surgery

## 2017-05-22 VITALS — BP 195/105 | HR 94 | Temp 98.4°F | Wt 175.0 lb

## 2017-05-22 DIAGNOSIS — K429 Umbilical hernia without obstruction or gangrene: Secondary | ICD-10-CM

## 2017-05-22 DIAGNOSIS — K4020 Bilateral inguinal hernia, without obstruction or gangrene, not specified as recurrent: Secondary | ICD-10-CM

## 2017-05-22 MED ORDER — HYDROCODONE-ACETAMINOPHEN 5-300 MG PO TABS: 1.0000 | ORAL_TABLET | ORAL | 0 refills | Status: DC | PRN

## 2017-05-22 NOTE — Patient Instructions (Signed)

## 2017-05-22 NOTE — Progress Notes (Signed)
Outpatient postop visit  05/22/2017  Marcus Huang is an 66 y.o. male.    Procedure: Laparoscopic bilateral inguinal hernia repair and umbilical herniorrhaphy  CC: Minimal pain and swelling.  HPI: Patient had a laparoscopic bilateral inguinal hernia repair and umbilical herniorrhaphy concomitantly. Patient is experiencing minimal pain and swelling he states he did not have much bruising and use ice for the first few days. He is much better today than he has been all week. Denies nausea vomiting fevers or chills.  Medications reviewed.    Physical Exam:  There were no vitals taken for this visit.    PE: Minimal resolving ecchymosis minimal swelling nontender Steri-Strips still in place. Minimal eschar at the umbilical area.    Assessment/Plan:  Status post laparoscopic inguinal hernia repair bilateral and umbilical herniorrhaphy. Patient is doing quite well he has asked for a refill of his Vicodin and I will refill #20. He'll follow-up with [place] in 2 weeks.  Florene Glen, MD, FACS

## 2017-06-04 ENCOUNTER — Encounter: Payer: Self-pay | Admitting: Surgery

## 2017-06-04 ENCOUNTER — Ambulatory Visit (INDEPENDENT_AMBULATORY_CARE_PROVIDER_SITE_OTHER): Payer: Self-pay | Admitting: Surgery

## 2017-06-04 VITALS — BP 179/108 | HR 67 | Temp 97.8°F | Ht 68.0 in | Wt 174.6 lb

## 2017-06-04 DIAGNOSIS — K4020 Bilateral inguinal hernia, without obstruction or gangrene, not specified as recurrent: Secondary | ICD-10-CM

## 2017-06-04 DIAGNOSIS — K429 Umbilical hernia without obstruction or gangrene: Secondary | ICD-10-CM

## 2017-06-04 MED ORDER — TRAMADOL HCL 50 MG PO TABS: 50.0000 mg | ORAL_TABLET | Freq: Four times a day (QID) | ORAL | 0 refills | Status: DC | PRN

## 2017-06-04 NOTE — Progress Notes (Signed)
Outpatient postop visit  06/04/2017  Marcus Huang is an 66 y.o. male.    Procedure: Laparoscopic bilateral inguinal hernia repair, umbilical hernia repair  CC: Abdominal pain  HPI: This patient underwent a laparoscopic preperitoneal repair of bilateral inguinal hernias, he's also had an umbilical hernia. Patient describes mostly right groin pain and right testicular pain. He has no nausea vomiting. He is requesting additional pain medication but does not want something as strong as Vicodin and has requested tramadol.  Medications reviewed.    Physical Exam:  BP (!) 179/108   Pulse 67   Temp 97.8 F (36.6 C) (Oral)   Ht 5\' 8"  (1.727 m)   Wt 174 lb 9.6 oz (79.2 kg)   BMI 26.55 kg/m     PE: Resolving ecchymosis minimal tenderness in the right cord and testicle with minimal swelling. Left side is non-tender and umbilical hernia site is healing well without erythema.    Assessment/Plan:  Mild right sided ischemic orchitis as a likely cause of his increased pain on the right side. He states that it is better than it has been but he is requesting additional pain medications but not as strong as the Vicodin. I recommended filling a prescription for tramadol and see him back in 3 weeks. The pathophysiology of wound healing and orchitis was discussed with him.  Florene Glen, MD, FACS

## 2017-06-04 NOTE — Patient Instructions (Signed)
Please see your follow up appointment listed below.  °

## 2017-06-25 ENCOUNTER — Encounter: Payer: Self-pay | Admitting: Surgery

## 2017-06-25 ENCOUNTER — Ambulatory Visit (INDEPENDENT_AMBULATORY_CARE_PROVIDER_SITE_OTHER): Payer: Self-pay | Admitting: Surgery

## 2017-06-25 VITALS — BP 183/94 | HR 69 | Temp 98.0°F | Ht 68.0 in | Wt 174.4 lb

## 2017-06-25 DIAGNOSIS — K4020 Bilateral inguinal hernia, without obstruction or gangrene, not specified as recurrent: Secondary | ICD-10-CM

## 2017-06-25 DIAGNOSIS — K429 Umbilical hernia without obstruction or gangrene: Secondary | ICD-10-CM

## 2017-06-25 NOTE — Progress Notes (Signed)
Surgical Clinic Progress/Follow-up Note   HPI:  66 y.o. Male presents to clinic for post-op follow-up evaluation 6 weeks s/p laparoscopic repair of B/L inguinal hernias with mesh and umbilical hernia. Patient reports his Left groin and umbilicus never experienced pain, but he has continued to have Right groin pain and what he describes as a Right groin palpable "cord", both of which have slowly improved with his pain initially requiring frequent narcotics, followed by less frequent and pain more easily controlled with Tramadol, which he has not taken in > 1 week with resolution of Right testicular soreness. He otherwise denies fever/chills or burning with urination.  Review of Systems:  Constitutional: denies any other weight loss, fever, chills, or sweats  Eyes: denies any other vision changes, history of eye injury  ENT: denies sore throat, hearing problems  Respiratory: denies shortness of breath, wheezing  Cardiovascular: denies chest pain, palpitations  Gastrointestinal: abdominal pain, N/V, and bowel function as per HPI Musculoskeletal: denies any other joint pains or cramps  Skin: Denies any other rashes or skin discolorations  Neurological: denies any other headache, dizziness, weakness  Psychiatric: denies any other depression, anxiety  All other review of systems: otherwise negative   Vital Signs:  BP (!) 183/94   Pulse 69   Temp 98 F (36.7 C) (Oral)   Ht 5\' 8"  (1.727 m)   Wt 174 lb 6.4 oz (79.1 kg)   BMI 26.52 kg/m    Physical Exam:  Constitutional:  -- Normal body habitus  -- Awake, alert, and oriented x3  Eyes:  -- Pupils equally round and reactive to light  -- No scleral icterus  Ear, nose, throat:  -- No jugular venous distension  -- No nasal drainage, bleeding Pulmonary:  -- No crackles -- Equal breath sounds bilaterally -- Breathing non-labored at rest Cardiovascular:  -- S1, S2 present  -- No pericardial rubs  Gastrointestinal:  -- Soft, nontender,  non-distended, no guarding/rebound -- B/L testicles easily palpated in scrotum and NT with no erythema or echymosis, mildly tender Right groin fibrotic cord more prominent than NT Left spermatic cord, laparoscopic port sites well-healed/healing without erythema or drainage -- No other abdominal masses appreciated, pulsatile or otherwise  Musculoskeletal / Integumentary:  -- Wounds or skin discoloration: None appreciated except healed/healing post-surgical abdominal laparoscopic port sites  -- Extremities: B/L UE and LE FROM, hands and feet warm, no edema  Neurologic:  -- Motor function: intact and symmetric  -- Sensation: intact and symmetric   Assessment:  66 y.o. yo Male with a problem list including...  Patient Active Problem List   Diagnosis Date Noted  . Non-recurrent bilateral inguinal hernia without obstruction or gangrene   . Umbilical hernia without obstruction and without gangrene   . Carotid artery thrombosis   . Diabetes type 2, uncontrolled ([place]) 02/14/2014  . Hyperlipidemia, unspecified 02/14/2014  . Hypertension, essential, benign 02/14/2014    presents to clinic for post-op follow-up evaluation with Right groin pain continuing to slowly improve 6 weeks s/p laparoscopic repair of B/L inguinal hernias with mesh and umbilical hernia.  Plan:   - okay to gradually resume activities with decreasing restrictions, allow pain/discomfort to guide  - offered additional follow-up to confirm further improvement, patient expresses preference for prn  - return to clinic as needed, instructed to call office if any questions or concerns  All of the above recommendations were discussed with the patient, and all of patient's questions were answered to his expressed satisfaction.  -- Marilynne Drivers Rosana Hoes,  MD, [place]: [facility] Partner[facility] for exceptional care. Office: [phone]

## 2017-06-25 NOTE — Patient Instructions (Signed)
Please ive our office a call if you have any further questions or concerns that we may assist with.

## 2017-08-17 ENCOUNTER — Other Ambulatory Visit: Payer: Self-pay | Admitting: Surgery

## 2018-04-01 DIAGNOSIS — E7800 Pure hypercholesterolemia, unspecified: Secondary | ICD-10-CM | POA: Diagnosis not present

## 2018-04-01 DIAGNOSIS — E119 Type 2 diabetes mellitus without complications: Secondary | ICD-10-CM | POA: Diagnosis not present

## 2018-04-08 DIAGNOSIS — G4733 Obstructive sleep apnea (adult) (pediatric): Secondary | ICD-10-CM | POA: Diagnosis not present

## 2018-04-08 DIAGNOSIS — M5442 Lumbago with sciatica, left side: Secondary | ICD-10-CM | POA: Diagnosis not present

## 2018-04-08 DIAGNOSIS — E7800 Pure hypercholesterolemia, unspecified: Secondary | ICD-10-CM | POA: Diagnosis not present

## 2018-04-08 DIAGNOSIS — G8929 Other chronic pain: Secondary | ICD-10-CM | POA: Diagnosis not present

## 2018-04-08 DIAGNOSIS — E1165 Type 2 diabetes mellitus with hyperglycemia: Secondary | ICD-10-CM | POA: Diagnosis not present

## 2018-04-08 DIAGNOSIS — I10 Essential (primary) hypertension: Secondary | ICD-10-CM | POA: Diagnosis not present

## 2018-06-30 DIAGNOSIS — E7800 Pure hypercholesterolemia, unspecified: Secondary | ICD-10-CM | POA: Diagnosis not present

## 2018-06-30 DIAGNOSIS — E1165 Type 2 diabetes mellitus with hyperglycemia: Secondary | ICD-10-CM | POA: Diagnosis not present

## 2018-07-09 DIAGNOSIS — I10 Essential (primary) hypertension: Secondary | ICD-10-CM | POA: Diagnosis not present

## 2018-07-09 DIAGNOSIS — G4733 Obstructive sleep apnea (adult) (pediatric): Secondary | ICD-10-CM | POA: Diagnosis not present

## 2018-07-09 DIAGNOSIS — E7800 Pure hypercholesterolemia, unspecified: Secondary | ICD-10-CM | POA: Diagnosis not present

## 2018-07-09 DIAGNOSIS — Z0000 Encounter for general adult medical examination without abnormal findings: Secondary | ICD-10-CM | POA: Diagnosis not present

## 2018-07-09 DIAGNOSIS — M5442 Lumbago with sciatica, left side: Secondary | ICD-10-CM | POA: Diagnosis not present

## 2018-07-09 DIAGNOSIS — G8929 Other chronic pain: Secondary | ICD-10-CM | POA: Diagnosis not present

## 2018-07-09 DIAGNOSIS — E1165 Type 2 diabetes mellitus with hyperglycemia: Secondary | ICD-10-CM | POA: Diagnosis not present

## 2018-08-25 DIAGNOSIS — H6691 Otitis media, unspecified, right ear: Secondary | ICD-10-CM | POA: Diagnosis not present

## 2018-10-06 DIAGNOSIS — E7800 Pure hypercholesterolemia, unspecified: Secondary | ICD-10-CM | POA: Diagnosis not present

## 2018-10-06 DIAGNOSIS — E1165 Type 2 diabetes mellitus with hyperglycemia: Secondary | ICD-10-CM | POA: Diagnosis not present

## 2018-10-13 DIAGNOSIS — I10 Essential (primary) hypertension: Secondary | ICD-10-CM | POA: Diagnosis not present

## 2018-10-13 DIAGNOSIS — M5442 Lumbago with sciatica, left side: Secondary | ICD-10-CM | POA: Diagnosis not present

## 2018-10-13 DIAGNOSIS — E119 Type 2 diabetes mellitus without complications: Secondary | ICD-10-CM | POA: Diagnosis not present

## 2018-10-13 DIAGNOSIS — G8929 Other chronic pain: Secondary | ICD-10-CM | POA: Diagnosis not present

## 2018-10-13 DIAGNOSIS — G4733 Obstructive sleep apnea (adult) (pediatric): Secondary | ICD-10-CM | POA: Diagnosis not present

## 2018-10-13 DIAGNOSIS — E7800 Pure hypercholesterolemia, unspecified: Secondary | ICD-10-CM | POA: Diagnosis not present

## 2019-05-30 ENCOUNTER — Emergency Department: Payer: PPO

## 2019-05-30 ENCOUNTER — Other Ambulatory Visit: Payer: Self-pay

## 2019-05-30 ENCOUNTER — Observation Stay
Admission: EM | Admit: 2019-05-30 | Discharge: 2019-06-01 | Disposition: A | Discharge status: Home / Self Care | Payer: PPO | Attending: Internal Medicine | Admitting: Internal Medicine

## 2019-05-30 ENCOUNTER — Encounter: Payer: Self-pay | Admitting: Emergency Medicine

## 2019-05-30 DIAGNOSIS — I160 Hypertensive urgency: Secondary | ICD-10-CM | POA: Diagnosis not present

## 2019-05-30 DIAGNOSIS — R9431 Abnormal electrocardiogram [ECG] [EKG]: Secondary | ICD-10-CM | POA: Diagnosis not present

## 2019-05-30 DIAGNOSIS — K219 Gastro-esophageal reflux disease without esophagitis: Secondary | ICD-10-CM | POA: Insufficient documentation

## 2019-05-30 DIAGNOSIS — I6529 Occlusion and stenosis of unspecified carotid artery: Secondary | ICD-10-CM

## 2019-05-30 DIAGNOSIS — E785 Hyperlipidemia, unspecified: Secondary | ICD-10-CM | POA: Insufficient documentation

## 2019-05-30 DIAGNOSIS — Z20828 Contact with and (suspected) exposure to other viral communicable diseases: Secondary | ICD-10-CM | POA: Diagnosis not present

## 2019-05-30 DIAGNOSIS — Z8249 Family history of ischemic heart disease and other diseases of the circulatory system: Secondary | ICD-10-CM | POA: Diagnosis not present

## 2019-05-30 DIAGNOSIS — Z79899 Other long term (current) drug therapy: Secondary | ICD-10-CM | POA: Insufficient documentation

## 2019-05-30 DIAGNOSIS — E1151 Type 2 diabetes mellitus with diabetic peripheral angiopathy without gangrene: Secondary | ICD-10-CM | POA: Insufficient documentation

## 2019-05-30 DIAGNOSIS — Z7982 Long term (current) use of aspirin: Secondary | ICD-10-CM | POA: Diagnosis not present

## 2019-05-30 DIAGNOSIS — G4730 Sleep apnea, unspecified: Secondary | ICD-10-CM | POA: Insufficient documentation

## 2019-05-30 DIAGNOSIS — Z03818 Encounter for observation for suspected exposure to other biological agents ruled out: Secondary | ICD-10-CM | POA: Diagnosis not present

## 2019-05-30 DIAGNOSIS — I10 Essential (primary) hypertension: Secondary | ICD-10-CM | POA: Diagnosis not present

## 2019-05-30 DIAGNOSIS — R4020 Unspecified coma: Secondary | ICD-10-CM | POA: Diagnosis not present

## 2019-05-30 DIAGNOSIS — R002 Palpitations: Secondary | ICD-10-CM | POA: Diagnosis not present

## 2019-05-30 LAB — CBC
HCT: 44.3 % (ref 39.0–52.0)
Hemoglobin: 15.4 g/dL (ref 13.0–17.0)
MCH: 30.4 pg (ref 26.0–34.0)
MCHC: 34.8 g/dL (ref 30.0–36.0)
MCV: 87.4 fL (ref 80.0–100.0)
Platelets: 210 10*3/uL (ref 150–400)
RBC: 5.07 MIL/uL (ref 4.22–5.81)
RDW: 13.1 % (ref 11.5–15.5)
WBC: 6.3 10*3/uL (ref 4.0–10.5)
nRBC: 0 % (ref 0.0–0.2)

## 2019-05-30 LAB — BASIC METABOLIC PANEL
Anion gap: 13 (ref 5–15)
BUN: 20 mg/dL (ref 8–23)
CO2: 25 mmol/L (ref 22–32)
Calcium: 9.5 mg/dL (ref 8.9–10.3)
Chloride: 101 mmol/L (ref 98–111)
Creatinine, Ser: 1.04 mg/dL (ref 0.61–1.24)
GFR calc Af Amer: >60 mL/min (ref >60)
GFR calc non Af Amer: >60 mL/min (ref >60)
Glucose, Bld: 110 mg/dL — ABNORMAL HIGH (ref 70–99)
Potassium: 4 mmol/L (ref 3.5–5.1)
Sodium: 139 mmol/L (ref 135–145)

## 2019-05-30 LAB — TROPONIN I (HIGH SENSITIVITY)
Troponin I (High Sensitivity): 7 ng/L (ref <18)
Troponin I (High Sensitivity): 7 ng/L (ref <18)

## 2019-05-30 MED ORDER — LABETALOL HCL 5 MG/ML IV SOLN
5.0000 mg | Freq: Once | INTRAVENOUS | Status: AC
  Administered 2019-05-30: 21:00:00 5 mg via INTRAVENOUS
  Filled 2019-05-30: qty 4

## 2019-05-30 MED ORDER — AMLODIPINE BESYLATE 5 MG PO TABS
5.0000 mg | ORAL_TABLET | Freq: Once | ORAL | Status: AC
  Administered 2019-05-30: 5 mg via ORAL
  Filled 2019-05-30: qty 1

## 2019-05-30 MED ORDER — LABETALOL HCL 5 MG/ML IV SOLN
5.0000 mg | Freq: Once | INTRAVENOUS | Status: AC
  Administered 2019-05-30: 5 mg via INTRAVENOUS
  Filled 2019-05-30: qty 4

## 2019-05-30 MED ORDER — LABETALOL HCL 5 MG/ML IV SOLN
5.0000 mg | Freq: Once | INTRAVENOUS | Status: AC
  Administered 2019-05-30: 23:00:00 5 mg via INTRAVENOUS

## 2019-05-30 MED ORDER — LABETALOL HCL 5 MG/ML IV SOLN
10.0000 mg | Freq: Once | INTRAVENOUS | Status: AC
  Administered 2019-05-30: 10 mg via INTRAVENOUS
  Filled 2019-05-30: qty 4

## 2019-05-30 MED ORDER — HYDRALAZINE HCL 20 MG/ML IJ SOLN
10.0000 mg | Freq: Once | INTRAMUSCULAR | Status: AC
  Administered 2019-05-30: 10 mg via INTRAVENOUS
  Filled 2019-05-30: qty 1

## 2019-05-30 MED ORDER — ASPIRIN 81 MG PO CHEW
324.0000 mg | CHEWABLE_TABLET | Freq: Once | ORAL | Status: AC
Start: 2019-05-30 — End: 2019-05-30
  Administered 2019-05-30: 324 mg via ORAL
  Filled 2019-05-30: qty 4

## 2019-05-30 NOTE — ED Notes (Addendum)
Pt laying in bed speaking with this RN in NAD, A&Ox4. Reports feeling like his heart was racing starting last night. States this does not feel like palpitations, just fast HR. No chest pain, feels like BP is high

## 2019-05-30 NOTE — ED Notes (Signed)
Dr Robinson at bedside 

## 2019-05-30 NOTE — ED Triage Notes (Signed)
Here for feeling like heart beating fast.  Started last night and eased up but then started again this AM.  Unlabored. Ambulatory. Feels like heart beating "hard".  Has been taking his HTN meds.  Denies [facility]

## 2019-05-30 NOTE — ED Notes (Signed)
Pt ambulated to toilet without assistance, gait steady 

## 2019-05-30 NOTE — ED Provider Notes (Signed)
[facility] Emergency Department Provider Note    First MD Initiated Contact with Patient 05/30/19 2039     (approximate)  I have reviewed the triage vital signs and the nursing notes.   HISTORY  Chief Complaint Palpitations    HPI BON REASON is a 68 y.o. male close past medical history presents to the ER for evaluation of elevated blood pressure and general feeling of uneasiness.  Has not made any recent medication changes.  Denies any specific chest pain or pressure.  Does feel some discomfort in his head and throbbing.  No specific pain.  No blurry vision.  Is never felt like this before.    Past Medical History:  Diagnosis Date  . Carotid artery thrombosis    Left Carotid Artery  . Diabetes ([place])   . Diverticulosis   . GERD (gastroesophageal reflux disease)   . History of kidney stones   . Hyperlipidemia   . Hypertension   . Sleep apnea    DOES NOT USE C-PAP   Family History  Problem Relation Age of Onset  . Hypertension Father   . Stroke Father   . Coronary artery disease Maternal Grandmother   . Stroke Maternal Grandmother   . Diabetes Mother   . Hypertension Mother   . Stroke Mother   . Stroke Paternal Grandmother   . Prostate cancer Neg Hx   . Bladder Cancer Neg Hx    Past Surgical History:  Procedure Laterality Date  . EYE SURGERY Bilateral    LASIK EYE SURGERY  . INGUINAL HERNIA REPAIR Bilateral 05/12/2017   Procedure: LAPAROSCOPIC BILATERAL INGUINAL HERNIA REPAIR, umbilical hernia repair;  Surgeon: Florene Glen, MD;  Location: [facility] ORS;  Service: General;  Laterality: Bilateral;  . none    . TONSILLECTOMY     Patient Active Problem List   Diagnosis Date Noted  . Non-recurrent bilateral inguinal hernia without obstruction or gangrene   . Umbilical hernia without obstruction and without gangrene   . Carotid artery thrombosis   . Diabetes type 2, uncontrolled ([place]) 02/14/2014  . Hyperlipidemia, unspecified  02/14/2014  . Hypertension, essential, benign 02/14/2014      Prior to Admission medications   Medication Sig Start Date End Date Taking? Authorizing Provider  aspirin EC 81 MG tablet Take 81 mg by mouth daily at 12 noon.     [provider]  traMADol (ULTRAM) 50 MG tablet Take 1 tablet (50 mg total) by mouth every 6 (six) hours as needed. Patient not taking: Reported on 06/25/2017 06/04/17   Florene Glen, MD  valsartan-hydrochlorothiazide (DIOVAN-HCT) 160-12.5 MG tablet Take 1 tablet by mouth daily.  04/03/15   [provider]    Allergies Patient has no known allergies.    Social History Social History   Tobacco Use  . Smoking status: Never Smoker  . Smokeless tobacco: Never Used  Substance Use Topics  . Alcohol use: No  . Drug use: No    Review of Systems Patient denies headaches, rhinorrhea, blurry vision, numbness, shortness of breath, chest pain, edema, cough, abdominal pain, nausea, vomiting, diarrhea, dysuria, fevers, rashes or hallucinations unless otherwise stated above in HPI. ____________________________________________   PHYSICAL EXAM:  VITAL SIGNS: Vitals:   05/30/19 2200 05/30/19 2215  BP: (!) 181/93 (!) 177/91  Pulse: 69 70  Resp:    Temp:    SpO2: 97% 97%    Constitutional: Alert and oriented.  Eyes: Conjunctivae are normal.  Head: Atraumatic. Nose: No congestion/rhinnorhea.  Mouth/Throat: Mucous membranes are moist.   Neck: No stridor. Painless ROM.  Cardiovascular: Normal rate, regular rhythm. Grossly normal heart sounds.  Good peripheral circulation. Respiratory: Normal respiratory effort.  No retractions. Lungs CTAB. Gastrointestinal: Soft and nontender. No distention. No abdominal bruits. No CVA tenderness. Genitourinary:  Musculoskeletal: No lower extremity tenderness nor edema.  No joint effusions. Neurologic:  Normal speech and language. No gross focal neurologic deficits are appreciated. No facial droop Skin:   Skin is warm, dry and intact. No rash noted. Psychiatric: Mood and affect are normal. Speech and behavior are normal.  ____________________________________________   LABS (all labs ordered are listed, but only abnormal results are displayed)  Results for orders placed or performed during the hospital encounter of 05/30/19 (from the past 24 hour(s))  Basic metabolic panel     Status: Abnormal   Collection Time: 05/30/19  6:58 PM  Result Value Ref Range   Sodium 139 135 - 145 mmol/L   Potassium 4.0 3.5 - 5.1 mmol/L   Chloride 101 98 - 111 mmol/L   CO2 25 22 - 32 mmol/L   Glucose, Bld 110 (H) 70 - 99 mg/dL   BUN 20 8 - 23 mg/dL   Creatinine, Ser 1.04 0.61 - 1.24 mg/dL   Calcium 9.5 8.9 - 10.3 mg/dL   GFR calc non Af Amer >60 >60 mL/min   GFR calc Af Amer >60 >60 mL/min   Anion gap 13 5 - 15  CBC     Status: None   Collection Time: 05/30/19  6:58 PM  Result Value Ref Range   WBC 6.3 4.0 - 10.5 K/uL   RBC 5.07 4.22 - 5.81 MIL/uL   Hemoglobin 15.4 13.0 - 17.0 g/dL   HCT 44.3 39.0 - 52.0 %   MCV 87.4 80.0 - 100.0 fL   MCH 30.4 26.0 - 34.0 pg   MCHC 34.8 30.0 - 36.0 g/dL   RDW 13.1 11.5 - 15.5 %   Platelets 210 150 - 400 K/uL   nRBC 0.0 0.0 - 0.2 %  Troponin I (High Sensitivity)     Status: None   Collection Time: 05/30/19  6:58 PM  Result Value Ref Range   Troponin I (High Sensitivity) 7 <18 ng/L  Troponin I (High Sensitivity)     Status: None   Collection Time: 05/30/19  9:09 PM  Result Value Ref Range   Troponin I (High Sensitivity) 7 <18 ng/L   ____________________________________________  EKG My review and personal interpretation at Time: 18:55   Indication: palpitations  Rate: 99  Rhythm: sinus Axis: normal Other: nonspecific st abn, no stemi criteria ____________________________________________  RADIOLOGY  I personally reviewed all radiographic images ordered to evaluate for the above acute complaints and reviewed radiology reports and findings.  These findings  were personally discussed with the patient.  Please see medical record for radiology report.  ____________________________________________   PROCEDURES  Procedure(s) performed:  Procedures    Critical Care performed: no ____________________________________________   INITIAL IMPRESSION / ASSESSMENT AND PLAN / ED COURSE  Pertinent labs & imaging results that were available during my care of the patient were reviewed by me and considered in my medical decision making (see chart for details).   DDX: Hypertensive urgency, ACS, CHF, bleed, malignant hypertension, anxiety  RENNIE GALINATO is a 68 y.o. who presents to the ED with symptoms as described above.  Patient afebrile but very hypertensive.  Will give oral and IV antihypertensive medications.  Will order blood work for the  above differential.  The patient will be placed on continuous pulse oximetry and telemetry for monitoring.  Laboratory evaluation will be sent to evaluate for the above complaints.     Clinical Course as of May 29 2318  Mon May 30, 2019  2317 Patient with persistently elevated blood pressure.  CT imaging is reassuring.  Troponins are stable.  He is requiring several doses of IV antihypertensive medications given his age and risk factors will discuss with hospitalist for admission for further medical management.  Have discussed with the patient and available family all diagnostics and treatments performed thus far and all questions were answered to the best of my ability. The patient demonstrates understanding and agreement with plan.    [PR]    Clinical Course User Index [PR] Merlyn Lot, MD    The patient was evaluated in Emergency Department today for the symptoms described in the history of present illness. He/she was evaluated in the context of the global COVID-19 pandemic, which necessitated consideration that the patient might be at risk for infection with the SARS-CoV-2 virus that causes COVID-19.  Institutional protocols and algorithms that pertain to the evaluation of patients at risk for COVID-19 are in a state of rapid change based on information released by regulatory bodies including the CDC and federal and state organizations. These policies and algorithms were followed during the patient's care in the ED.  As part of my medical decision making, I reviewed the following data within the [facility] notes reviewed and incorporated, Labs reviewed, notes from prior ED visits and [place] Controlled Substance Database   ____________________________________________   FINAL CLINICAL IMPRESSION(S) / ED DIAGNOSES  Final diagnoses:  Hypertensive urgency      NEW MEDICATIONS STARTED DURING THIS VISIT:  New Prescriptions   No medications on file     Note:  This document was prepared using Dragon voice recognition software and may include unintentional dictation errors.    Merlyn Lot, MD 05/30/19 [phone]

## 2019-05-31 ENCOUNTER — Observation Stay: Payer: PPO

## 2019-05-31 ENCOUNTER — Other Ambulatory Visit: Payer: Self-pay

## 2019-05-31 DIAGNOSIS — E119 Type 2 diabetes mellitus without complications: Secondary | ICD-10-CM | POA: Diagnosis not present

## 2019-05-31 DIAGNOSIS — I160 Hypertensive urgency: Secondary | ICD-10-CM | POA: Diagnosis present

## 2019-05-31 DIAGNOSIS — I779 Disorder of arteries and arterioles, unspecified: Secondary | ICD-10-CM | POA: Diagnosis not present

## 2019-05-31 DIAGNOSIS — I739 Peripheral vascular disease, unspecified: Secondary | ICD-10-CM | POA: Diagnosis not present

## 2019-05-31 DIAGNOSIS — E785 Hyperlipidemia, unspecified: Secondary | ICD-10-CM | POA: Diagnosis not present

## 2019-05-31 LAB — SARS CORONAVIRUS 2 (TAT 6-24 HRS): SARS Coronavirus 2: NEGATIVE

## 2019-05-31 LAB — CBC
HCT: 40.8 % (ref 39.0–52.0)
Hemoglobin: 14.2 g/dL (ref 13.0–17.0)
MCH: 30 pg (ref 26.0–34.0)
MCHC: 34.8 g/dL (ref 30.0–36.0)
MCV: 86.3 fL (ref 80.0–100.0)
Platelets: 191 10*3/uL (ref 150–400)
RBC: 4.73 MIL/uL (ref 4.22–5.81)
RDW: 12.9 % (ref 11.5–15.5)
WBC: 6.9 10*3/uL (ref 4.0–10.5)
nRBC: 0 % (ref 0.0–0.2)

## 2019-05-31 LAB — BASIC METABOLIC PANEL
Anion gap: 12 (ref 5–15)
BUN: 17 mg/dL (ref 8–23)
CO2: 23 mmol/L (ref 22–32)
Calcium: 9 mg/dL (ref 8.9–10.3)
Chloride: 103 mmol/L (ref 98–111)
Creatinine, Ser: 0.89 mg/dL (ref 0.61–1.24)
GFR calc Af Amer: >60 mL/min (ref >60)
GFR calc non Af Amer: >60 mL/min (ref >60)
Glucose, Bld: 109 mg/dL — ABNORMAL HIGH (ref 70–99)
Potassium: 3.2 mmol/L — ABNORMAL LOW (ref 3.5–5.1)
Sodium: 138 mmol/L (ref 135–145)

## 2019-05-31 LAB — GLUCOSE, CAPILLARY
Glucose-Capillary: 122 mg/dL — ABNORMAL HIGH (ref 70–99)
Glucose-Capillary: 99 mg/dL (ref 70–99)

## 2019-05-31 MED ORDER — SODIUM CHLORIDE 0.9% FLUSH: 3.0000 mL | INTRAVENOUS | Status: DC | PRN

## 2019-05-31 MED ORDER — VALSARTAN-HYDROCHLOROTHIAZIDE 160-12.5 MG PO TABS: 1.0000 | ORAL_TABLET | Freq: Every day | ORAL | Status: DC

## 2019-05-31 MED ORDER — ACETAMINOPHEN 325 MG PO TABS: 650.0000 mg | ORAL_TABLET | Freq: Four times a day (QID) | ORAL | Status: DC | PRN

## 2019-05-31 MED ORDER — ROSUVASTATIN CALCIUM 5 MG PO TABS
5.0000 mg | ORAL_TABLET | Freq: Every day | ORAL | Status: DC
  Administered 2019-05-31 – 2019-06-01 (×2): 5 mg via ORAL
  Filled 2019-05-31 (×2): qty 1

## 2019-05-31 MED ORDER — HYDROCHLOROTHIAZIDE 12.5 MG PO CAPS
12.5000 mg | ORAL_CAPSULE | Freq: Every day | ORAL | Status: DC
  Administered 2019-05-31 – 2019-06-01 (×2): 12.5 mg via ORAL
  Filled 2019-05-31 (×2): qty 1

## 2019-05-31 MED ORDER — LABETALOL HCL 5 MG/ML IV SOLN
20.0000 mg | INTRAVENOUS | Status: DC | PRN
  Filled 2019-05-31: qty 4

## 2019-05-31 MED ORDER — MAGNESIUM HYDROXIDE 400 MG/5ML PO SUSP: 30.0000 mL | Freq: Every day | ORAL | Status: DC | PRN

## 2019-05-31 MED ORDER — LORAZEPAM 2 MG/ML IJ SOLN
INTRAMUSCULAR | Status: AC
  Filled 2019-05-31: qty 1

## 2019-05-31 MED ORDER — SODIUM CHLORIDE 0.9 % IV SOLN: 250.0000 mL | INTRAVENOUS | Status: DC | PRN

## 2019-05-31 MED ORDER — IRBESARTAN 150 MG PO TABS
150.0000 mg | ORAL_TABLET | Freq: Every day | ORAL | Status: DC
  Administered 2019-05-31 – 2019-06-01 (×2): 150 mg via ORAL
  Filled 2019-05-31 (×2): qty 1

## 2019-05-31 MED ORDER — ONDANSETRON HCL 4 MG/2ML IJ SOLN: 4.0000 mg | Freq: Four times a day (QID) | INTRAMUSCULAR | Status: DC | PRN

## 2019-05-31 MED ORDER — ONDANSETRON HCL 4 MG PO TABS
4.0000 mg | ORAL_TABLET | Freq: Four times a day (QID) | ORAL | Status: DC | PRN
  Filled 2019-05-31: qty 1

## 2019-05-31 MED ORDER — ACETAMINOPHEN 650 MG RE SUPP
650.0000 mg | Freq: Four times a day (QID) | RECTAL | Status: DC | PRN
  Filled 2019-05-31: qty 1

## 2019-05-31 MED ORDER — HYDRALAZINE HCL 20 MG/ML IJ SOLN
10.0000 mg | Freq: Four times a day (QID) | INTRAMUSCULAR | Status: DC | PRN
  Filled 2019-05-31: qty 0.5

## 2019-05-31 MED ORDER — ASPIRIN EC 81 MG PO TBEC
81.0000 mg | DELAYED_RELEASE_TABLET | Freq: Every day | ORAL | Status: DC
  Administered 2019-05-31 – 2019-06-01 (×2): 81 mg via ORAL
  Filled 2019-05-31 (×3): qty 1

## 2019-05-31 MED ORDER — TRAZODONE HCL 50 MG PO TABS
25.0000 mg | ORAL_TABLET | Freq: Every evening | ORAL | Status: DC | PRN
  Administered 2019-05-31: 25 mg via ORAL
  Filled 2019-05-31: qty 0.5
  Filled 2019-05-31: qty 1

## 2019-05-31 MED ORDER — LABETALOL HCL 5 MG/ML IV SOLN: 10.0000 mg | INTRAVENOUS | Status: DC | PRN

## 2019-05-31 MED ORDER — SODIUM CHLORIDE 0.9% FLUSH
3.0000 mL | Freq: Two times a day (BID) | INTRAVENOUS | Status: DC
  Administered 2019-05-31 – 2019-06-01 (×3): 3 mL via INTRAVENOUS

## 2019-05-31 MED ORDER — ENOXAPARIN SODIUM 40 MG/0.4ML ~~LOC~~ SOLN
40.0000 mg | Freq: Every day | SUBCUTANEOUS | Status: DC
  Administered 2019-05-31: 40 mg via SUBCUTANEOUS
  Filled 2019-05-31 (×2): qty 0.4

## 2019-05-31 MED ORDER — LORAZEPAM 2 MG/ML IJ SOLN
0.5000 mg | Freq: Once | INTRAMUSCULAR | Status: AC
  Administered 2019-05-31: 0.5 mg via INTRAVENOUS

## 2019-05-31 NOTE — ED Notes (Signed)
This RN to bedside at this time, pt resting in bed playing on phone. Introduced self to patient, apolgoized for and explained delay. Pt states understanding. Will continue to monitor for further patient needs. Call bell remains within reach, pt states understanding in use. C/o continued feeling of throbbing in his ears, Pt noted to have HR 83, NSR on monitor. Urinal emptied for patient at this time.

## 2019-05-31 NOTE — ED Notes (Signed)
This RN spoke with Dr. Posey Pronto regarding BP, per Dr. Posey Pronto give home meds first then reassess to give PRN BP meds. Labetolol not given due to patient's HR 63 when this RN secure chat with MD. PRN order for hydralazine also noted.

## 2019-05-31 NOTE — Care Management Obs Status (Signed)
[facility] NOTIFICATION   Patient Details  Name: Marcus Huang MRN: [id number] Date of Birth: 12/02/1950   Medicare Observation Status Notification Given:  Yes    Shade Flood, LCSW 05/31/2019, 2:29 PM

## 2019-05-31 NOTE — ED Notes (Signed)
Pt provided with breakfast tray at this time, hand sanitizer provided to patient to clean his hands prior to eating. Pt denies further needs. Will continue to monitor for further patient needs.

## 2019-05-31 NOTE — Progress Notes (Signed)
[place] [facility] [place] NAME: Marcus Huang    MR#:  [id number]  DATE OF BIRTH:  [birth year]  SUBJECTIVE:  patient came in with symptoms of palpitation on and off. He himself of his blood pressure meds for last six months. Denies any headache however has some woozy feeling when his blood pressure goes up. On admission his blood pressure was 213 over 118  feels much better now. No chest pain or shortness of breath.  REVIEW OF SYSTEMS:   Review of Systems  Constitutional: Negative for chills, fever and weight loss.  HENT: Negative for ear discharge, ear pain and nosebleeds.   Eyes: Negative for blurred vision, pain and discharge.  Respiratory: Negative for sputum production, shortness of breath, wheezing and stridor.   Cardiovascular: Negative for chest pain, palpitations, orthopnea and PND.  Gastrointestinal: Negative for abdominal pain, diarrhea, nausea and vomiting.  Genitourinary: Negative for frequency and urgency.  Musculoskeletal: Negative for back pain and joint pain.  Neurological: Negative for sensory change, speech change, focal weakness and weakness.  Psychiatric/Behavioral: Negative for depression and hallucinations. The patient is not nervous/anxious.    Tolerating Diet:yesTolerating PT: not needed  DRUG ALLERGIES:  No Known Allergies  VITALS:  Blood pressure (!) 157/76, pulse 72, temperature 97.8 F (36.6 C), temperature source Oral, resp. rate 18, height 5\' 8"  (1.727 m), weight 77 kg, SpO2 99 %.  PHYSICAL EXAMINATION:   Physical Exam  GENERAL:  68 y.o.-year-old patient lying in the bed with no acute distress.  EYES: Pupils equal, round, reactive to light and accommodation. No scleral icterus. Extraocular muscles intact.  HEENT: Head atraumatic, normocephalic. Oropharynx and nasopharynx clear.  NECK:  Supple, no jugular venous distention. No thyroid enlargement, no tenderness.  LUNGS: Normal breath sounds bilaterally,  no wheezing, rales, rhonchi. No use of accessory muscles of respiration.  CARDIOVASCULAR: S1, S2 normal. No murmurs, rubs, or gallops.  ABDOMEN: Soft, nontender, nondistended. Bowel sounds present. No organomegaly or mass.  EXTREMITIES: No cyanosis, clubbing or edema b/l.    NEUROLOGIC: Cranial nerves II through XII are intact. No focal Motor or sensory deficits b/l.   PSYCHIATRIC:  patient is alert and oriented x 3.  SKIN: No obvious rash, lesion, or ulcer.   LABORATORY PANEL:  CBC Recent Labs  Lab 05/31/19 0511  WBC 6.9  HGB 14.2  HCT 40.8  PLT 191    Chemistries  Recent Labs  Lab 05/31/19 0511  NA 138  K 3.2*  CL 103  CO2 23  GLUCOSE 109*  BUN 17  CREATININE 0.89  CALCIUM 9.0   Cardiac Enzymes No results for input(s): TROPONINI in the last 168 hours. RADIOLOGY:  Dg Chest 2 View  Result Date: 05/30/2019 CLINICAL DATA:  Palpitations and tachycardia. EXAM: CHEST - 2 VIEW COMPARISON:  None. FINDINGS: The heart size and mediastinal contours are within normal limits. Mild scarring is again seen in the right middle lobe. No evidence of pulmonary infiltrate or edema. No evidence of pleural effusion. The visualized skeletal structures are unremarkable. IMPRESSION: Stable exam.  No active cardiopulmonary disease. Electronically Signed   By: Marlaine Hind M.D.   On: 05/30/2019 19:17   Ct Head Wo Contrast  Result Date: 05/30/2019 CLINICAL DATA:  Altered LOC EXAM: CT HEAD WITHOUT CONTRAST TECHNIQUE: Contiguous axial images were obtained from the base of the skull through the vertex without intravenous contrast. COMPARISON:  None. FINDINGS: Brain: No acute territorial infarction, hemorrhage or intracranial mass. The ventricles are of  normal size Vascular: No hyperdense vessel or unexpected calcification. Skull: Normal. Negative for fracture or focal lesion. Sinuses/Orbits: No acute finding. Other: None IMPRESSION: Negative non contrasted CT appearance of the brain Electronically Signed    By: Donavan Foil M.D.   On: 05/30/2019 22:53   ASSESSMENT AND PLAN:   Marcus Huang  is a 68 y.o. Caucasian male with a known history of hypertension and PAD, who came to the ER with palpitations and chills and a feeling of fighting something. No paresthesias or focal muscle weakness or headache or nausea or vomiting or abdominal pain. No chest pain, dyspnea, cough or wheezing or sick exposure.He ran out of blood pressure medication for a month.  1. Hypertensive urgency. - will resumed on Diovan/hct -  on prn IV Hydralazine and Labetalol. -Blood pressure much better. CT head negative  2. Peripheral arterial disease.  - Continue aspirin and statin therapy.  3. Dyslipidemia.  -Statin will be resumed.  4. Type 2 diabetes mellitus. - Supplemental Novolog and hold metformin.  5. DVT prophylaxsis. SQ Lovenox.  Discussed with patient and wife patient discharge tomorrow if blood pressure continues to be stable  Case discussed with Care Management/Social Worker. Management plans discussed with the patient, family and they are in agreement.  CODE STATUS: full  DVT Prophylaxis: lovenox  TOTAL TIME TAKING CARE OF THIS PATIENT: *35* minutes.  >50% time spent on counselling and coordination of care  POSSIBLE D/C IN *1-2* DAYS, DEPENDING ON CLINICAL CONDITION.  Note: This dictation was prepared with Dragon dictation along with smaller phrase technology. Any transcriptional errors that result from this process are unintentional.  [name] M.D on 05/31/2019 at 3:15 PM  Between 7am to 6pm - Pager - [phone]  After 6pm go to [email or web address] - password [email or web address] [facility]  Office  [phone]  CC: Primary care physician; Sofie Hartigan, MDPatient ID: Marcus Huang, male   DOB: [birth year], 68 y.o.   MRN: [id number]

## 2019-05-31 NOTE — TOC Progression Note (Signed)
Received CM consult for medication needs. Reviewed pt's H&P where MD indicates pt was without his BP medication for a month. Met with pt today to assess for medication related needs. Pt informs this LCSW that he does not have any difficulty obtaining medications or with cost. Per pt, he has his BP medication at home but he wasn't taking it consistently. He states that he has a lot of the medicine left at home. Pt states that he is not concerned with being able to obtain/afford any new medicine that might be prescribed at dc.   Will clear the CM consult. Pt is not anticipating any other TOC needs for dc.

## 2019-05-31 NOTE — H&P (Signed)
[place] at [place] NAME: Marcus Huang    MR#:  [id number]  DATE OF BIRTH:  [birth year]  DATE OF ADMISSION:  05/30/2019  PRIMARY CARE PHYSICIAN: Sofie Hartigan, MD   REQUESTING/REFERRING PHYSICIAN: Merlyn Lot, MD  CHIEF COMPLAINT:   Chief Complaint  Patient presents with  . Palpitations    HISTORY OF PRESENT ILLNESS:  Marcus Huang  is a 69 y.o. Caucasian male with a known history of hypertension and PAD, who came to the ER with palpitations and chills and a feeling of fighting something. No paresthesias or focal muscle weakness or headache or nausea or vomiting or abdominal pain. No chest pain, dyspnea, cough or wheezing or sick exposure.He ran out of blood pressure medication for a month.  In the ER blood pressure was elevated  Up to 213/120. He was given Labetalol 5mg   IV x 3 and amlodipine 5 mg and 4 baby aspirin. Portable CXR and head CT scan were negative. EKG showed Normal sinus rhythm with T wave inversion inferiorly.  The patient was given IV Hydralazine and will be placed in observation in a medically monitored bed for further management.  PAST MEDICAL HISTORY:   Past Medical History:  Diagnosis Date  . Carotid artery thrombosis    Left Carotid Artery  . Diabetes ([place])   . Diverticulosis   . GERD (gastroesophageal reflux disease)   . History of kidney stones   . Hyperlipidemia   . Hypertension   . Sleep apnea    DOES NOT USE C-PAP    PAST SURGICAL HISTORY:   Past Surgical History:  Procedure Laterality Date  . EYE SURGERY Bilateral    LASIK EYE SURGERY  . INGUINAL HERNIA REPAIR Bilateral 05/12/2017   Procedure: LAPAROSCOPIC BILATERAL INGUINAL HERNIA REPAIR, umbilical hernia repair;  Surgeon: Florene Glen, MD;  Location: [facility] ORS;  Service: General;  Laterality: Bilateral;  . none    . TONSILLECTOMY      SOCIAL HISTORY:   Social History   Tobacco Use  . Smoking status: Never Smoker  .  Smokeless tobacco: Never Used  Substance Use Topics  . Alcohol use: No    FAMILY HISTORY:   Family History  Problem Relation Age of Onset  . Hypertension Father   . Stroke Father   . Coronary artery disease Maternal Grandmother   . Stroke Maternal Grandmother   . Diabetes Mother   . Hypertension Mother   . Stroke Mother   . Stroke Paternal Grandmother   . Prostate cancer Neg Hx   . Bladder Cancer Neg Hx     DRUG ALLERGIES:  No Known Allergies  REVIEW OF SYSTEMS:   ROS As per history of present illness. All pertinent systems were reviewed above. Constitutional,  HEENT, cardiovascular, respiratory, GI, GU, musculoskeletal, neuro, psychiatric, endocrine,  integumentary and hematologic systems were reviewed and are otherwise  negative/unremarkable except for positive findings mentioned above in the HPI.   MEDICATIONS AT HOME:   Prior to Admission medications   Medication Sig Start Date End Date Taking? Authorizing Provider  aspirin EC 81 MG tablet Take 81 mg by mouth daily at 12 noon.    Yes [provider]  losartan-hydrochlorothiazide (HYZAAR) 100-12.5 MG tablet Take 1 tablet by mouth daily. 10/13/18 10/13/19 Yes [provider]  rosuvastatin (CRESTOR) 5 MG tablet Take 1 tablet by mouth daily. 03/04/19   [provider]  traMADol (ULTRAM) 50 MG tablet Take 1 tablet (50 mg total)  by mouth every 6 (six) hours as needed. Patient not taking: Reported on 06/25/2017 06/04/17   Florene Glen, MD  valsartan-hydrochlorothiazide (DIOVAN-HCT) 160-12.5 MG tablet Take 1 tablet by mouth daily.  04/03/15   [provider]      VITAL SIGNS:  Blood pressure (!) 179/94, pulse 70, temperature 98.4 F (36.9 C), temperature source Oral, resp. rate 18, height 5\' 8"  (1.727 m), weight 76.7 kg, SpO2 99 %.  PHYSICAL EXAMINATION:  Physical Exam  GENERAL:  68 y.o.-year-old Caucasian patient lying in the bed with no acute distress.  EYES: Pupils equal, round,  reactive to light and accommodation. No scleral icterus. Extraocular muscles intact.  HEENT: Head atraumatic, normocephalic. Oropharynx and nasopharynx clear.  NECK:  Supple, no jugular venous distention. No thyroid enlargement, no tenderness.  LUNGS: Normal breath sounds bilaterally, no wheezing, rales,rhonchi or crepitation. No use of accessory muscles of respiration.  CARDIOVASCULAR: Regular rate and rhythm, S1, S2 normal. No murmurs, rubs, or gallops.  ABDOMEN: Soft, nondistended, nontender. Bowel sounds present. No organomegaly or mass.  EXTREMITIES: No pedal edema, cyanosis, or clubbing.  NEUROLOGIC: Cranial nerves II through XII are intact. Muscle strength 5/5 in all extremities. Sensation intact. Gait not checked.  PSYCHIATRIC: The patient is alert and oriented x 3.  Normal affect and good eye contact. SKIN: No obvious rash, lesion, or ulcer.   LABORATORY PANEL:   CBC Recent Labs  Lab 05/30/19 1858  WBC 6.3  HGB 15.4  HCT 44.3  PLT 210   ------------------------------------------------------------------------------------------------------------------  Chemistries  Recent Labs  Lab 05/30/19 1858  NA 139  K 4.0  CL 101  CO2 25  GLUCOSE 110*  BUN 20  CREATININE 1.04  CALCIUM 9.5   ------------------------------------------------------------------------------------------------------------------  Cardiac Enzymes No results for input(s): TROPONINI in the last 168 hours. ------------------------------------------------------------------------------------------------------------------  RADIOLOGY:  Dg Chest 2 View  Result Date: 05/30/2019 CLINICAL DATA:  Palpitations and tachycardia. EXAM: CHEST - 2 VIEW COMPARISON:  None. FINDINGS: The heart size and mediastinal contours are within normal limits. Mild scarring is again seen in the right middle lobe. No evidence of pulmonary infiltrate or edema. No evidence of pleural effusion. The visualized skeletal structures are  unremarkable. IMPRESSION: Stable exam.  No active cardiopulmonary disease. Electronically Signed   By: Marlaine Hind M.D.   On: 05/30/2019 19:17   Ct Head Wo Contrast  Result Date: 05/30/2019 CLINICAL DATA:  Altered LOC EXAM: CT HEAD WITHOUT CONTRAST TECHNIQUE: Contiguous axial images were obtained from the base of the skull through the vertex without intravenous contrast. COMPARISON:  None. FINDINGS: Brain: No acute territorial infarction, hemorrhage or intracranial mass. The ventricles are of normal size Vascular: No hyperdense vessel or unexpected calcification. Skull: Normal. Negative for fracture or focal lesion. Sinuses/Orbits: No acute finding. Other: None IMPRESSION: Negative non contrasted CT appearance of the brain Electronically Signed   By: Donavan Foil M.D.   On: 05/30/2019 22:53      IMPRESSION AND PLAN:   1. Hypertensive urgency. - Will admit to observation monitored bed. - He will resumed on Diovan/hct - Will place him on prn IV Hydralazine and Labetalol.  2. Peripheral arterial disease.  - Continue aspirin and statin therapy.  3. Dyslipidemia.  -Statin will be resumed.  4. Type 2 diabetes mellitus. - Supplemental Novolog and hold metformin.  5. DVT prophylaxsis. SQ Lovenox.    All the records are reviewed and case discussed with ED provider. The plan of care was discussed in details with the patient (and family). I  answered all questions. The patient agreed to proceed with the above mentioned plan. Further management will depend upon hospital course.   CODE STATUS: Full code  TOTAL TIME TAKING CARE OF THIS PATIENT: 50 minutes.    Christel Mormon M.D on 05/31/2019 at 12:31 AM  Pager - [phone]  After 6pm go to [email or web address] - Proofreader  [facility]  Office  [phone]  CC: Primary care physician; Sofie Hartigan, MD   Note: This dictation was prepared with Dragon dictation along with smaller phrase technology.  Any transcriptional errors that result from this process are unintentional.

## 2019-06-01 ENCOUNTER — Telehealth (INDEPENDENT_AMBULATORY_CARE_PROVIDER_SITE_OTHER): Payer: Self-pay | Admitting: Vascular Surgery

## 2019-06-01 DIAGNOSIS — I739 Peripheral vascular disease, unspecified: Secondary | ICD-10-CM | POA: Diagnosis not present

## 2019-06-01 DIAGNOSIS — E785 Hyperlipidemia, unspecified: Secondary | ICD-10-CM | POA: Diagnosis not present

## 2019-06-01 DIAGNOSIS — E119 Type 2 diabetes mellitus without complications: Secondary | ICD-10-CM | POA: Diagnosis not present

## 2019-06-01 DIAGNOSIS — I160 Hypertensive urgency: Secondary | ICD-10-CM | POA: Diagnosis not present

## 2019-06-01 LAB — BASIC METABOLIC PANEL
Anion gap: 11 (ref 5–15)
BUN: 19 mg/dL (ref 8–23)
CO2: 26 mmol/L (ref 22–32)
Calcium: 9.2 mg/dL (ref 8.9–10.3)
Chloride: 101 mmol/L (ref 98–111)
Creatinine, Ser: 1.03 mg/dL (ref 0.61–1.24)
GFR calc Af Amer: >60 mL/min (ref >60)
GFR calc non Af Amer: >60 mL/min (ref >60)
Glucose, Bld: 104 mg/dL — ABNORMAL HIGH (ref 70–99)
Potassium: 3.5 mmol/L (ref 3.5–5.1)
Sodium: 138 mmol/L (ref 135–145)

## 2019-06-01 LAB — HIV ANTIBODY (ROUTINE TESTING W REFLEX): HIV Screen 4th Generation wRfx: NONREACTIVE

## 2019-06-01 MED ORDER — IRBESARTAN 150 MG PO TABS: 150.0000 mg | ORAL_TABLET | Freq: Every day | ORAL | Status: DC

## 2019-06-01 MED ORDER — VALSARTAN-HYDROCHLOROTHIAZIDE 160-25 MG PO TABS: 1.0000 | ORAL_TABLET | Freq: Every day | ORAL | 11 refills | Status: DC

## 2019-06-01 MED ORDER — HYDROCHLOROTHIAZIDE 25 MG PO TABS: 25.0000 mg | ORAL_TABLET | Freq: Every day | ORAL | Status: DC

## 2019-06-01 NOTE — Progress Notes (Signed)
Discharge instructions explained to pt/ verbalized an understanding/ iv and tele removed/ will transport off unit when ride arrives.  

## 2019-06-01 NOTE — Telephone Encounter (Signed)
Patient had ultrasounds in our office in 2018 but did not see a provider. He has not been seen in our office by a provider since 2017.  Please advise. AS, CMA

## 2019-06-01 NOTE — Telephone Encounter (Signed)
Based on the ultrasound in 2018 he had minimal stenosis, it was 1-39%, so very little.  The ultrasound at the hospital had very similar numbers so it is likely that it is still minimal.  We would typically follow this patient every two years with follow up ultrasound.  At this time there is nothing to do based on the studies at the hospital.  If the patient has any further questions he can be scheduled to come in for an office visit.  No studies necessary.

## 2019-06-01 NOTE — Discharge Instructions (Signed)
Keep log of you BP a home

## 2019-06-01 NOTE — Discharge Summary (Signed)
[facility] NAME: Marcus Huang    MR#:  [id number]  DATE OF BIRTH:  Sep 11, 1951  DATE OF ADMISSION:  05/30/2019 ADMITTING PHYSICIAN: Christel Mormon, MD  DATE OF DISCHARGE: 06/01/2019  PRIMARY CARE PHYSICIAN: Sofie Hartigan, MD    ADMISSION DIAGNOSIS:  Hypertensive urgency [I16.0]  DISCHARGE DIAGNOSIS:  Hypertensive urgency--Resolved  SECONDARY DIAGNOSIS:   Past Medical History:  Diagnosis Date  . Carotid artery thrombosis    Left Carotid Artery  . Diabetes ([place])   . Diverticulosis   . GERD (gastroesophageal reflux disease)   . History of kidney stones   . Hyperlipidemia   . Hypertension   . Sleep apnea    DOES NOT USE C-PAP    HOSPITAL COURSE:  Cinnamon a68 y.o.Caucasianmalewith a known history of hypertension and PAD, who came to the ER with palpitations and chills and a feeling of fighting something. No paresthesias or focal muscle weakness or headache or nausea or vomiting or abdominal pain. No chest pain, dyspnea, cough or wheezing or sick exposure.He ran out of blood pressure medication for a month.  1. Hypertensive urgency. - will resumed on Diovan/hct 160-25 mg qd -pt will keep log of BP at home and low salt diet.  -  on prn IV Hydralazine -Blood pressure much better. CT head negative  2. Peripheral arterial disease.  - Continue aspirin and statin therapy. -US carotid doppler ok--pt follows with Dr Delana Meyer  3. Dyslipidemia.  -Statin will be resumed.  4. Type 2 diabetes mellitus. - Supplemental Novolog and hold metformin--since sugars are well controlled and last A1c per patient was 5.5%  5. DVT prophylaxsis. SQ Lovenox.  D/c home  CONSULTS OBTAINED:    DRUG ALLERGIES:  No Known Allergies  DISCHARGE MEDICATIONS:   Allergies as of 06/01/2019   No Known Allergies     Medication List    STOP taking these medications   losartan-hydrochlorothiazide 100-12.5 MG  tablet Commonly known as: HYZAAR   traMADol 50 MG tablet Commonly known as: Ultram   valsartan-hydrochlorothiazide 160-12.5 MG tablet Commonly known as: DIOVAN-HCT Replaced by: valsartan-hydrochlorothiazide 160-25 MG tablet     TAKE these medications   aspirin EC 81 MG tablet Take 81 mg by mouth daily at 12 noon.   rosuvastatin 5 MG tablet Commonly known as: CRESTOR Take 1 tablet by mouth daily.   valsartan-hydrochlorothiazide 160-25 MG tablet Commonly known as: DIOVAN-HCT Take 1 tablet by mouth daily. Replaces: valsartan-hydrochlorothiazide 160-12.5 MG tablet       If you experience worsening of your admission symptoms, develop shortness of breath, life threatening emergency, suicidal or homicidal thoughts you must seek medical attention immediately by calling 911 or calling your MD immediately  if symptoms less severe.  You Must read complete instructions/literature along with all the possible adverse reactions/side effects for all the Medicines you take and that have been prescribed to you. Take any new Medicines after you have completely understood and accept all the possible adverse reactions/side effects.   Please note  You were cared for by a hospitalist during your hospital stay. If you have any questions about your discharge medications or the care you received while you were in the hospital after you are discharged, you can call the unit and asked to speak with the hospitalist on call if the hospitalist that took care of you is not available. Once you are discharged, your primary care physician will handle any further medical issues. Please note that  NO REFILLS for any discharge medications will be authorized once you are discharged, as it is imperative that you return to your primary care physician (or establish a relationship with a primary care physician if you do not have one) for your aftercare needs so that they can reassess your need for medications and monitor your  lab values. Today   SUBJECTIVE   Feels good  VITAL SIGNS:  Blood pressure (!) 157/84, pulse 77, temperature 97.6 F (36.4 C), temperature source Oral, resp. rate 20, height 5\' 8"  (1.727 m), weight 77 kg, SpO2 98 %.  I/O:    Intake/Output Summary (Last 24 hours) at 06/01/2019 1035 Last data filed at 06/01/2019 0935 Gross per 24 hour  Intake 243 ml  Output 400 ml  Net -157 ml    PHYSICAL EXAMINATION:  GENERAL:  68 y.o.-year-old patient lying in the bed with no acute distress.  EYES: Pupils equal, round, reactive to light and accommodation. No scleral icterus. Extraocular muscles intact.  HEENT: Head atraumatic, normocephalic. Oropharynx and nasopharynx clear.  NECK:  Supple, no jugular venous distention. No thyroid enlargement, no tenderness.  LUNGS: Normal breath sounds bilaterally, no wheezing, rales,rhonchi or crepitation. No use of accessory muscles of respiration.  CARDIOVASCULAR: S1, S2 normal. No murmurs, rubs, or gallops.  ABDOMEN: Soft, non-tender, non-distended. Bowel sounds present. No organomegaly or mass.  EXTREMITIES: No pedal edema, cyanosis, or clubbing.  NEUROLOGIC: Cranial nerves II through XII are intact. Muscle strength 5/5 in all extremities. Sensation intact. Gait not checked.  PSYCHIATRIC: The patient is alert and oriented x 3.  SKIN: No obvious rash, lesion, or ulcer.   DATA REVIEW:   CBC  Recent Labs  Lab 05/31/19 0511  WBC 6.9  HGB 14.2  HCT 40.8  PLT 191    Chemistries  Recent Labs  Lab 06/01/19 0822  NA 138  K 3.5  CL 101  CO2 26  GLUCOSE 104*  BUN 19  CREATININE 1.03  CALCIUM 9.2    Microbiology Results   Recent Results (from the past 240 hour(s))  SARS CORONAVIRUS 2 (TAT 6-24 HRS) Nasopharyngeal Nasopharyngeal Swab     Status: None   Collection Time: 05/30/19 10:51 PM   Specimen: Nasopharyngeal Swab  Result Value Ref Range Status   SARS Coronavirus 2 NEGATIVE NEGATIVE Final    Comment: (NOTE) SARS-CoV-2 target nucleic  acids are NOT DETECTED. The SARS-CoV-2 RNA is generally detectable in upper and lower respiratory specimens during the acute phase of infection. Negative results do not preclude SARS-CoV-2 infection, do not rule out co-infections with other pathogens, and should not be used as the sole basis for treatment or other patient management decisions. Negative results must be combined with clinical observations, patient history, and epidemiological information. The expected result is Negative. Fact Sheet for Patients: SugarRoll.be Fact Sheet for Healthcare Providers: https://www.woods-mathews.com/ This test is not yet approved or cleared by the [place] FDA and  has been authorized for detection and/or diagnosis of SARS-CoV-2 by FDA under an Emergency Use Authorization (EUA). This EUA will remain  in effect (meaning this test can be used) for the duration of the COVID-19 declaration under Section 56 4(b)(1) of the Act, 21 U.S.C. section 360bbb-3(b)(1), unless the authorization is terminated or revoked sooner. Performed at [facility], [place] [address] [place], [place] 01/02/1951     RADIOLOGY:  Dg Chest 2 View  Result Date: 05/30/2019 CLINICAL DATA:  Palpitations and tachycardia. EXAM: CHEST - 2 VIEW COMPARISON:  None. FINDINGS: The heart  size and mediastinal contours are within normal limits. Mild scarring is again seen in the right middle lobe. No evidence of pulmonary infiltrate or edema. No evidence of pleural effusion. The visualized skeletal structures are unremarkable. IMPRESSION: Stable exam.  No active cardiopulmonary disease. Electronically Signed   By: Marlaine Hind M.D.   On: 05/30/2019 19:17   Ct Head Wo Contrast  Result Date: 05/30/2019 CLINICAL DATA:  Altered LOC EXAM: CT HEAD WITHOUT CONTRAST TECHNIQUE: Contiguous axial images were obtained from the base of the skull through the vertex without intravenous contrast. COMPARISON:   None. FINDINGS: Brain: No acute territorial infarction, hemorrhage or intracranial mass. The ventricles are of normal size Vascular: No hyperdense vessel or unexpected calcification. Skull: Normal. Negative for fracture or focal lesion. Sinuses/Orbits: No acute finding. Other: None IMPRESSION: Negative non contrasted CT appearance of the brain Electronically Signed   By: Donavan Foil M.D.   On: 05/30/2019 22:53   US Carotid Bilateral  Result Date: 05/31/2019 CLINICAL DATA:  68 year old male with carotid artery disease. EXAM: BILATERAL CAROTID DUPLEX ULTRASOUND TECHNIQUE: Pearline Cables scale imaging, color Doppler and duplex ultrasound were performed of bilateral carotid and vertebral arteries in the neck. COMPARISON:  None. FINDINGS: Criteria: Quantification of carotid stenosis is based on velocity parameters that correlate the residual internal carotid diameter with NASCET-based stenosis levels, using the diameter of the distal internal carotid lumen as the denominator for stenosis measurement. The following velocity measurements were obtained: RIGHT ICA: 122/33 cm/sec CCA: 123456 cm/sec SYSTOLIC ICA/CCA RATIO:  1.3 ECA:  150 cm/sec LEFT ICA: 125/29 cm/sec CCA: 0000000 cm/sec SYSTOLIC ICA/CCA RATIO:  1.0 ECA:  142 cm/sec RIGHT CAROTID ARTERY: No significant atherosclerotic plaque or evidence of stenosis. RIGHT VERTEBRAL ARTERY:  Patent with normal antegrade flow. LEFT CAROTID ARTERY: No significant atherosclerotic plaque or stenosis. LEFT VERTEBRAL ARTERY:  Patent with normal antegrade flow. IMPRESSION: No significant atherosclerotic plaque or stenosis in either internal carotid artery. Signed, Criselda Peaches, MD, [facility] Electronically Signed   By: Jacqulynn Cadet M.D.   On: 05/31/2019 18:46     CODE STATUS:     Code Status Orders  (From admission, onward)         Start     Ordered   05/31/19 0031  Full code  Continuous     05/31/19  0030        Code Status History    This patient has a current code status but no historical code status.   Advance Care Planning Activity    Advance Directive Documentation     Most Recent Value  Type of Advance Directive  Healthcare Power of Attorney  Pre-existing out of facility DNR order (yellow form or pink MOST form)  -  "MOST" Form in Place?  -      TOTAL TIME TAKING CARE OF THIS PATIENT: *40* minutes.    [name] M.D on 06/01/2019 at 10:35 AM  Between 7am to 6pm - Pager - [phone] After 6pm go to [email or web address] - password [email or web address] [facility]  Office  [phone]  CC: Primary care physician; Sofie Hartigan, MD

## 2019-06-02 DIAGNOSIS — Z09 Encounter for follow-up examination after completed treatment for conditions other than malignant neoplasm: Secondary | ICD-10-CM | POA: Diagnosis not present

## 2019-06-02 DIAGNOSIS — Z125 Encounter for screening for malignant neoplasm of prostate: Secondary | ICD-10-CM | POA: Diagnosis not present

## 2019-06-02 DIAGNOSIS — E119 Type 2 diabetes mellitus without complications: Secondary | ICD-10-CM | POA: Diagnosis not present

## 2019-06-02 DIAGNOSIS — E785 Hyperlipidemia, unspecified: Secondary | ICD-10-CM | POA: Diagnosis not present

## 2019-06-02 DIAGNOSIS — K429 Umbilical hernia without obstruction or gangrene: Secondary | ICD-10-CM

## 2019-06-02 DIAGNOSIS — I700 Atherosclerosis of aorta: Secondary | ICD-10-CM | POA: Insufficient documentation

## 2019-06-02 DIAGNOSIS — I160 Hypertensive urgency: Secondary | ICD-10-CM | POA: Diagnosis not present

## 2019-06-02 DIAGNOSIS — I6529 Occlusion and stenosis of unspecified carotid artery: Secondary | ICD-10-CM | POA: Insufficient documentation

## 2019-06-02 DIAGNOSIS — I10 Essential (primary) hypertension: Secondary | ICD-10-CM | POA: Diagnosis not present

## 2019-06-02 NOTE — Telephone Encounter (Signed)
Patient has been made aware of the below and stated he would call if he needed any further apts. AS, CMA

## 2019-06-06 DIAGNOSIS — I160 Hypertensive urgency: Secondary | ICD-10-CM | POA: Diagnosis not present

## 2019-06-07 ENCOUNTER — Other Ambulatory Visit: Payer: Self-pay | Admitting: Gerontology

## 2019-06-07 DIAGNOSIS — I160 Hypertensive urgency: Secondary | ICD-10-CM

## 2019-06-10 ENCOUNTER — Ambulatory Visit
Admission: RE | Admit: 2019-06-10 | Discharge: 2019-06-10 | Disposition: A | Discharge status: Home / Self Care | Payer: PPO | Source: Ambulatory Visit | Attending: Gerontology | Admitting: Gerontology

## 2019-06-10 ENCOUNTER — Other Ambulatory Visit: Payer: Self-pay

## 2019-06-10 DIAGNOSIS — I701 Atherosclerosis of renal artery: Secondary | ICD-10-CM | POA: Insufficient documentation

## 2019-06-10 DIAGNOSIS — Z7982 Long term (current) use of aspirin: Secondary | ICD-10-CM | POA: Insufficient documentation

## 2019-06-10 DIAGNOSIS — E785 Hyperlipidemia, unspecified: Secondary | ICD-10-CM | POA: Diagnosis not present

## 2019-06-10 DIAGNOSIS — Z87442 Personal history of urinary calculi: Secondary | ICD-10-CM | POA: Insufficient documentation

## 2019-06-10 DIAGNOSIS — I10 Essential (primary) hypertension: Secondary | ICD-10-CM | POA: Insufficient documentation

## 2019-06-10 DIAGNOSIS — I160 Hypertensive urgency: Secondary | ICD-10-CM | POA: Insufficient documentation

## 2019-06-10 DIAGNOSIS — Z79899 Other long term (current) drug therapy: Secondary | ICD-10-CM | POA: Insufficient documentation

## 2019-06-10 DIAGNOSIS — E1151 Type 2 diabetes mellitus with diabetic peripheral angiopathy without gangrene: Secondary | ICD-10-CM | POA: Insufficient documentation

## 2019-06-13 DIAGNOSIS — R0602 Shortness of breath: Secondary | ICD-10-CM | POA: Diagnosis not present

## 2019-06-13 DIAGNOSIS — R002 Palpitations: Secondary | ICD-10-CM | POA: Diagnosis not present

## 2019-06-13 DIAGNOSIS — I10 Essential (primary) hypertension: Secondary | ICD-10-CM | POA: Diagnosis not present

## 2019-06-21 DIAGNOSIS — R0602 Shortness of breath: Secondary | ICD-10-CM | POA: Diagnosis not present

## 2019-06-23 ENCOUNTER — Ambulatory Visit (INDEPENDENT_AMBULATORY_CARE_PROVIDER_SITE_OTHER): Payer: PPO | Admitting: Vascular Surgery

## 2019-06-23 ENCOUNTER — Other Ambulatory Visit: Payer: Self-pay

## 2019-06-23 ENCOUNTER — Encounter (INDEPENDENT_AMBULATORY_CARE_PROVIDER_SITE_OTHER): Payer: Self-pay | Admitting: Vascular Surgery

## 2019-06-23 VITALS — BP 165/78 | HR 80 | Resp 12 | Ht 68.0 in | Wt 175.0 lb

## 2019-06-23 DIAGNOSIS — E119 Type 2 diabetes mellitus without complications: Secondary | ICD-10-CM

## 2019-06-23 DIAGNOSIS — R0989 Other specified symptoms and signs involving the circulatory and respiratory systems: Secondary | ICD-10-CM | POA: Diagnosis not present

## 2019-06-23 DIAGNOSIS — E782 Mixed hyperlipidemia: Secondary | ICD-10-CM | POA: Diagnosis not present

## 2019-06-23 DIAGNOSIS — I10 Essential (primary) hypertension: Secondary | ICD-10-CM | POA: Diagnosis not present

## 2019-06-23 NOTE — Progress Notes (Signed)
MRN : [id number]  Marcus Huang is a 68 y.o. (April 19, 1951) male who presents with chief complaint of  Chief Complaint  Patient presents with   Follow-up  .  History of Present Illness:  The patient returns to the office for followup and review of the noninvasive studies regarding renal vascular hypertension and renal artery stenosis. There have been no interval changes in the patient's blood pressure control.  He denies any major changes in is medications.  The patient denies headache or flushing.  No flank or unusual back pain.    There have been no significant changes to the patient's overall health care.  No interval shortening of the patient's walking distance or new symptoms consistent with claudication.  The patient denies the  development of rest pain symptoms. No new ulcers or wounds have occurred since the last visit.  The patient denies amaurosis fugax or recent TIA symptoms. There are no recent neurological changes noted. The patient denies history of DVT, PE or superficial thrombophlebitis. The patient denies recent episodes of angina or shortness of breath.     Current Meds  Medication Sig   azelastine (ASTELIN) 0.1 % nasal spray Place into the nose. PRN   cloNIDine (CATAPRES) 0.1 MG tablet Take by mouth.    Past Medical History:  Diagnosis Date   Carotid artery thrombosis    Left Carotid Artery   Diabetes (HCC)    Diverticulosis    GERD (gastroesophageal reflux disease)    History of kidney stones    Hyperlipidemia    Hypertension    Sleep apnea    DOES NOT USE C-PAP    Past Surgical History:  Procedure Laterality Date   EYE SURGERY Bilateral    LASIK EYE SURGERY   INGUINAL HERNIA REPAIR Bilateral 05/12/2017   Procedure: LAPAROSCOPIC BILATERAL INGUINAL HERNIA REPAIR, umbilical hernia repair;  Surgeon: Florene Glen, MD;  Location: [facility] ORS;  Service: General;  Laterality: Bilateral;   none     TONSILLECTOMY      Social  History Social History   Tobacco Use   Smoking status: Never Smoker   Smokeless tobacco: Never Used  Substance Use Topics   Alcohol use: No   Drug use: No    Family History Family History  Problem Relation Age of Onset   Hypertension Father    Stroke Father    Coronary artery disease Maternal Grandmother    Stroke Maternal Grandmother    Diabetes Mother    Hypertension Mother    Stroke Mother    Stroke Paternal Grandmother    Prostate cancer Neg Hx    Bladder Cancer Neg Hx     No Known Allergies   REVIEW OF SYSTEMS (Negative unless checked)  Constitutional: [] Weight loss  [] Fever  [] Chills Cardiac: [] Chest pain   [] Chest pressure   [] Palpitations   [] Shortness of breath when laying flat   [] Shortness of breath with exertion. Vascular:  [] Pain in legs with walking   [] Pain in legs at rest  [] History of DVT   [] Phlebitis   [] Swelling in legs   [] Varicose veins   [] Non-healing ulcers Pulmonary:   [] Uses home oxygen   [] Productive cough   [] Hemoptysis   [] Wheeze  [] COPD   [] Asthma Neurologic:  [] Dizziness   [] Seizures   [] History of stroke   [] History of TIA  [] Aphasia   [] Vissual changes   [] Weakness or numbness in arm   [] Weakness or numbness in leg Musculoskeletal:   [] Joint swelling   []   Joint pain   [] Low back pain Hematologic:  [] Easy bruising  [] Easy bleeding   [] Hypercoagulable state   [] Anemic Gastrointestinal:  [] Diarrhea   [] Vomiting  [] Gastroesophageal reflux/heartburn   [] Difficulty swallowing. Genitourinary:  [] Chronic kidney disease   [] Difficult urination  [] Frequent urination   [] Blood in urine Skin:  [] Rashes   [] Ulcers  Psychological:  [] History of anxiety   []  History of major depression.  Physical Examination  Vitals:   06/23/19 1504  BP: (!) 165/78  Pulse: 80  Resp: 12  Weight: 175 lb (79.4 kg)  Height: 5\' 8"  (1.727 m)   Body mass index is 26.61 kg/m. Gen: WD/WN, NAD Head: [place]/AT, No temporalis wasting.  Ear/Nose/Throat: Hearing  grossly intact, nares w/o erythema or drainage Eyes: PER, EOMI, sclera nonicteric.  Neck: Supple, no large masses.   Pulmonary:  Good air movement, no audible wheezing bilaterally, no use of accessory muscles.  Cardiac: RRR, no JVD Vascular:  Vessel Right Left  Radial Palpable Palpable  Gastrointestinal: Non-distended. No guarding/no peritoneal signs.  Musculoskeletal: M/S 5/5 throughout.  No deformity or atrophy.  Neurologic: CN 2-12 intact. Symmetrical.  Speech is fluent. Motor exam as listed above. Psychiatric: Judgment intact, Mood & affect appropriate for pt's clinical situation. Dermatologic: No rashes or ulcers noted.  No changes consistent with cellulitis. Lymph : No lichenification or skin changes of chronic lymphedema.  CBC Lab Results  Component Value Date   WBC 6.9 05/31/2019   HGB 14.2 05/31/2019   HCT 40.8 05/31/2019   MCV 86.3 05/31/2019   PLT 191 05/31/2019    BMET    Component Value Date/Time   NA 138 06/01/2019 0822   K 3.5 06/01/2019 0822   CL 101 06/01/2019 0822   CO2 26 06/01/2019 0822   GLUCOSE 104 (H) 06/01/2019 0822   BUN 19 06/01/2019 0822   CREATININE 1.03 06/01/2019 0822   CALCIUM 9.2 06/01/2019 0822   GFRNONAA >60 06/01/2019 0822   GFRAA >60 06/01/2019 0822   CrCl cannot be calculated (Patient's most recent lab result is older than the maximum 21 days allowed.).  COAG No results found for: INR, PROTIME  Radiology Dg Chest 2 View  Result Date: 05/30/2019 CLINICAL DATA:  Palpitations and tachycardia. EXAM: CHEST - 2 VIEW COMPARISON:  None. FINDINGS: The heart size and mediastinal contours are within normal limits. Mild scarring is again seen in the right middle lobe. No evidence of pulmonary infiltrate or edema. No evidence of pleural effusion. The visualized skeletal structures are unremarkable. IMPRESSION: Stable exam.  No active cardiopulmonary disease. Electronically Signed   By: Marlaine Hind M.D.   On: 05/30/2019 19:17   Ct Head Wo  Contrast  Result Date: 05/30/2019 CLINICAL DATA:  Altered LOC EXAM: CT HEAD WITHOUT CONTRAST TECHNIQUE: Contiguous axial images were obtained from the base of the skull through the vertex without intravenous contrast. COMPARISON:  None. FINDINGS: Brain: No acute territorial infarction, hemorrhage or intracranial mass. The ventricles are of normal size Vascular: No hyperdense vessel or unexpected calcification. Skull: Normal. Negative for fracture or focal lesion. Sinuses/Orbits: No acute finding. Other: None IMPRESSION: Negative non contrasted CT appearance of the brain Electronically Signed   By: Donavan Foil M.D.   On: 05/30/2019 22:53   US Carotid Bilateral  Result Date: 05/31/2019 CLINICAL DATA:  68 year old male with carotid artery disease. EXAM: BILATERAL CAROTID DUPLEX ULTRASOUND TECHNIQUE: Pearline Cables scale imaging, color Doppler and duplex ultrasound were performed of bilateral carotid and vertebral arteries in the neck. COMPARISON:  None. FINDINGS: Criteria:  Quantification of carotid stenosis is based on velocity parameters that correlate the residual internal carotid diameter with NASCET-based stenosis levels, using the diameter of the distal internal carotid lumen as the denominator for stenosis measurement. The following velocity measurements were obtained: RIGHT ICA: 122/33 cm/sec CCA: 123456 cm/sec SYSTOLIC ICA/CCA RATIO:  1.3 ECA:  150 cm/sec LEFT ICA: 125/29 cm/sec CCA: 0000000 cm/sec SYSTOLIC ICA/CCA RATIO:  1.0 ECA:  142 cm/sec RIGHT CAROTID ARTERY: No significant atherosclerotic plaque or evidence of stenosis. RIGHT VERTEBRAL ARTERY:  Patent with normal antegrade flow. LEFT CAROTID ARTERY: No significant atherosclerotic plaque or stenosis. LEFT VERTEBRAL ARTERY:  Patent with normal antegrade flow. IMPRESSION: No significant atherosclerotic plaque or stenosis in either internal carotid artery. Signed, Criselda Peaches, MD, [place] [facility]  Radiology Electronically Signed   By: Jacqulynn Cadet M.D.   On: 05/31/2019 18:46   US Renal Artery Duplex Complete  Result Date: 06/10/2019 CLINICAL DATA:  68 year old male with a history of hypertensive urgency EXAM: RENAL/URINARY TRACT ULTRASOUND RENAL DUPLEX DOPPLER ULTRASOUND COMPARISON:  CT April 06, 2017 FINDINGS: Right Kidney: Length: 10.3 cm. Echogenicity within normal limits. No mass or hydronephrosis visualized. Left Kidney: Length: 10.9 cm. Echogenicity within normal limits. No mass or hydronephrosis visualized. Bladder: Urinary bladder unremarkable with bilateral jets visualized RENAL DUPLEX ULTRASOUND Right Renal Artery Velocities: Origin:  156 cm/sec Mid:  140 cm/sec [place]:  141 cm/sec Interlobar:  28 cm/sec Arcuate:  23 cm/sec Left Renal Artery Velocities: Origin:  245 cm/sec Mid:  174 cm/sec Hilum:  159 cm/sec Interlobar:  23 cm/sec Arcuate:  15 cm/sec Aortic Velocity:  160 cm/sec Right Renal-Aortic Ratios: Origin: 1.0 Mid:  0.9 Hilum: 0.9 Interlobar: 0.2 Arcuate: 0.1 Left Renal-Aortic Ratios: Origin: 1.5 Mid: 1.1 Hilum: 1.0 Interlobar: 0.1 Arcuate: 0.1 IMPRESSION: No evidence of hydronephrosis. Directed duplex of the right renal artery demonstrates no evidence of significant stenosis. Directed duplex of the left renal artery demonstrates evidence of developing stenosis at the origin. Signed, Dulcy Fanny. Dellia Nims, RPVI [facility] Radiology Electronically Signed   By: Corrie Mckusick D.O.   On: 06/10/2019 16:28     Assessment/Plan 1. Malignant hypertension Given the poor control of his hypertension the Patient should undergo CT angiography of the carotid arteries to define the degree of stenosis of the renal arteries bilaterally and the anatomic suitability for surgery vs. intervention.  If the patient does indeed need surgery cardiac clearance will be required, once cleared the patient will be scheduled for surgery.  The risks, benefits  and alternative therapies were reviewed in detail with the patient.  All questions were answered.  The patient agrees to proceed with imaging.  Continue antiplatelet therapy as prescribed. Continue management of CAD, HTN and Hyperlipidemia. Healthy heart diet, encouraged exercise at least 4 times per week.    2. Type 2 diabetes mellitus without complication, without long-term current use of insulin (HCC) Continue hypoglycemic medications as already ordered, these medications have been reviewed and there are no changes at this time.  Hgb A1C to be monitored as already arranged by primary service   3. Mixed hyperlipidemia Continue statin as ordered and reviewed, no changes at this time   4. Other specified symptoms and signs involving the circulatory and respiratory systems See #1 - CT Angio Abd/Pel w/ and/or w/o; Future  Hortencia Pilar, MD  06/23/2019 3:12 PM

## 2019-06-27 DIAGNOSIS — R002 Palpitations: Secondary | ICD-10-CM | POA: Diagnosis not present

## 2019-07-01 ENCOUNTER — Other Ambulatory Visit: Payer: Self-pay

## 2019-07-01 ENCOUNTER — Telehealth (INDEPENDENT_AMBULATORY_CARE_PROVIDER_SITE_OTHER): Payer: Self-pay | Admitting: Vascular Surgery

## 2019-07-01 ENCOUNTER — Ambulatory Visit
Admission: RE | Admit: 2019-07-01 | Discharge: 2019-07-01 | Disposition: A | Discharge status: Home / Self Care | Payer: PPO | Source: Ambulatory Visit | Attending: Vascular Surgery | Admitting: Vascular Surgery

## 2019-07-01 DIAGNOSIS — N2889 Other specified disorders of kidney and ureter: Secondary | ICD-10-CM | POA: Diagnosis not present

## 2019-07-01 DIAGNOSIS — R0989 Other specified symptoms and signs involving the circulatory and respiratory systems: Secondary | ICD-10-CM | POA: Diagnosis not present

## 2019-07-01 DIAGNOSIS — I10 Essential (primary) hypertension: Secondary | ICD-10-CM | POA: Diagnosis not present

## 2019-07-01 DIAGNOSIS — I700 Atherosclerosis of aorta: Secondary | ICD-10-CM | POA: Diagnosis not present

## 2019-07-01 MED ORDER — IOHEXOL 350 MG/ML SOLN
80.0000 mL | Freq: Once | INTRAVENOUS | Status: AC | PRN
  Administered 2019-07-01: 10:00:00 80 mL via INTRAVENOUS

## 2019-07-01 NOTE — Telephone Encounter (Signed)
Can you please contact patient to schedule follow up visit with [facility] for review of CT results-per fallon. AS, CMA

## 2019-07-04 DIAGNOSIS — G4733 Obstructive sleep apnea (adult) (pediatric): Secondary | ICD-10-CM | POA: Diagnosis not present

## 2019-07-04 DIAGNOSIS — E119 Type 2 diabetes mellitus without complications: Secondary | ICD-10-CM | POA: Diagnosis not present

## 2019-07-04 DIAGNOSIS — G8929 Other chronic pain: Secondary | ICD-10-CM | POA: Diagnosis not present

## 2019-07-04 DIAGNOSIS — M5442 Lumbago with sciatica, left side: Secondary | ICD-10-CM | POA: Diagnosis not present

## 2019-07-04 DIAGNOSIS — E785 Hyperlipidemia, unspecified: Secondary | ICD-10-CM | POA: Diagnosis not present

## 2019-07-04 DIAGNOSIS — Z0000 Encounter for general adult medical examination without abnormal findings: Secondary | ICD-10-CM | POA: Diagnosis not present

## 2019-07-04 DIAGNOSIS — Z1211 Encounter for screening for malignant neoplasm of colon: Secondary | ICD-10-CM | POA: Diagnosis not present

## 2019-07-04 DIAGNOSIS — I10 Essential (primary) hypertension: Secondary | ICD-10-CM | POA: Diagnosis not present

## 2019-07-05 DIAGNOSIS — G4733 Obstructive sleep apnea (adult) (pediatric): Secondary | ICD-10-CM | POA: Diagnosis not present

## 2019-07-06 ENCOUNTER — Telehealth: Payer: Self-pay | Admitting: Gastroenterology

## 2019-07-06 DIAGNOSIS — Z1211 Encounter for screening for malignant neoplasm of colon: Secondary | ICD-10-CM

## 2019-07-06 NOTE — Progress Notes (Signed)
MRN : [id number]  Marcus Huang is a 68 y.o. (Jan 07, 1951) male who presents with chief complaint of No chief complaint on file. Marland Kitchen  History of Present Illness:   The patient is seen for follow up evaluation of renal artery stenosis status post CT angiogram. CT scan was done 07/01/2019. Patient reports that the test went well with no problems or complications.   The patient denies interval abdominal pain or symptoms.   The patient is taking enteric-coated aspirin 81 mg daily.  There is no history of migraine headaches. There is no history of seizures.  The patient has a history of coronary artery disease, no recent episodes of angina or shortness of breath. The patient denies PAD or claudication symptoms. There is a history of hyperlipidemia which is being treated with a statin.    CT angiogram is reviewed by me personally and shows the renal arteries are widely patent with minimal calcified plaque.  No aneurysm identified.  Left renal mass is noted    No outpatient medications have been marked as taking for the 07/07/19 encounter (Appointment) with Delana Meyer, Dolores Lory, MD.    Past Medical History:  Diagnosis Date  . Carotid artery thrombosis    Left Carotid Artery  . Diabetes ([place])   . Diverticulosis   . GERD (gastroesophageal reflux disease)   . History of kidney stones   . Hyperlipidemia   . Hypertension   . Sleep apnea    DOES NOT USE C-PAP    Past Surgical History:  Procedure Laterality Date  . EYE SURGERY Bilateral    LASIK EYE SURGERY  . INGUINAL HERNIA REPAIR Bilateral 05/12/2017   Procedure: LAPAROSCOPIC BILATERAL INGUINAL HERNIA REPAIR, umbilical hernia repair;  Surgeon: Florene Glen, MD;  Location: [facility] ORS;  Service: General;  Laterality: Bilateral;  . none    . TONSILLECTOMY      Social History Social History   Tobacco Use  . Smoking status: Never Smoker  . Smokeless tobacco: Never Used  Substance Use Topics  . Alcohol use: No  . Drug use: No     Family History Family History  Problem Relation Age of Onset  . Hypertension Father   . Stroke Father   . Coronary artery disease Maternal Grandmother   . Stroke Maternal Grandmother   . Diabetes Mother   . Hypertension Mother   . Stroke Mother   . Stroke Paternal Grandmother   . Prostate cancer Neg Hx   . Bladder Cancer Neg Hx     No Known Allergies   REVIEW OF SYSTEMS (Negative unless checked)  Constitutional: [] Weight loss  [] Fever  [] Chills Cardiac: [] Chest pain   [] Chest pressure   [] Palpitations   [] Shortness of breath when laying flat   [] Shortness of breath with exertion. Vascular:  [] Pain in legs with walking   [] Pain in legs at rest  [] History of DVT   [] Phlebitis   [] Swelling in legs   [] Varicose veins   [] Non-healing ulcers Pulmonary:   [] Uses home oxygen   [] Productive cough   [] Hemoptysis   [] Wheeze  [] COPD   [] Asthma Neurologic:  [] Dizziness   [] Seizures   [] History of stroke   [] History of TIA  [] Aphasia   [] Vissual changes   [] Weakness or numbness in arm   [] Weakness or numbness in leg Musculoskeletal:   [] Joint swelling   [] Joint pain   [] Low back pain Hematologic:  [] Easy bruising  [] Easy bleeding   [] Hypercoagulable state   [] Anemic Gastrointestinal:  [] Diarrhea   []   Vomiting  [] Gastroesophageal reflux/heartburn   [] Difficulty swallowing. Genitourinary:  [] Chronic kidney disease   [] Difficult urination  [] Frequent urination   [] Blood in urine Skin:  [] Rashes   [] Ulcers  Psychological:  [] History of anxiety   []  History of major depression.  Physical Examination  There were no vitals filed for this visit. There is no height or weight on file to calculate BMI. Gen: WD/WN, NAD Head: [place]/AT, No temporalis wasting.  Ear/Nose/Throat: Hearing grossly intact, nares w/o erythema or drainage Eyes: PER, EOMI, sclera nonicteric.  Neck: Supple, no large masses.   Pulmonary:  Good air movement, no audible wheezing bilaterally, no use of accessory muscles.  Cardiac:  RRR, no JVD Vascular:  No abdominal bruits Vessel Right Left  Radial Palpable Palpable  Gastrointestinal: Non-distended. No guarding/no peritoneal signs.  Musculoskeletal: M/S 5/5 throughout.  No deformity or atrophy.  Neurologic: CN 2-12 intact. Symmetrical.  Speech is fluent. Motor exam as listed above. Psychiatric: Judgment intact, Mood & affect appropriate for pt's clinical situation. Dermatologic: No rashes or ulcers noted.  No changes consistent with cellulitis. Lymph : No lichenification or skin changes of chronic lymphedema.  CBC Lab Results  Component Value Date   WBC 6.9 05/31/2019   HGB 14.2 05/31/2019   HCT 40.8 05/31/2019   MCV 86.3 05/31/2019   PLT 191 05/31/2019    BMET    Component Value Date/Time   NA 138 06/01/2019 0822   K 3.5 06/01/2019 0822   CL 101 06/01/2019 0822   CO2 26 06/01/2019 0822   GLUCOSE 104 (H) 06/01/2019 0822   BUN 19 06/01/2019 0822   CREATININE 1.03 06/01/2019 0822   CALCIUM 9.2 06/01/2019 0822   GFRNONAA >60 06/01/2019 0822   GFRAA >60 06/01/2019 0822   CrCl cannot be calculated (Patient's most recent lab result is older than the maximum 21 days allowed.).  COAG No results found for: INR, PROTIME  Radiology US Renal Artery Duplex Complete  Result Date: 06/10/2019 CLINICAL DATA:  68 year old male with a history of hypertensive urgency EXAM: RENAL/URINARY TRACT ULTRASOUND RENAL DUPLEX DOPPLER ULTRASOUND COMPARISON:  CT April 06, 2017 FINDINGS: Right Kidney: Length: 10.3 cm. Echogenicity within normal limits. No mass or hydronephrosis visualized. Left Kidney: Length: 10.9 cm. Echogenicity within normal limits. No mass or hydronephrosis visualized. Bladder: Urinary bladder unremarkable with bilateral jets visualized RENAL DUPLEX ULTRASOUND Right Renal Artery Velocities: Origin:  156 cm/sec Mid:  140 cm/sec Hilum:  141 cm/sec Interlobar:  28 cm/sec Arcuate:  23 cm/sec Left Renal Artery Velocities: Origin:  245 cm/sec Mid:  174 cm/sec Hilum:   159 cm/sec Interlobar:  23 cm/sec Arcuate:  15 cm/sec Aortic Velocity:  160 cm/sec Right Renal-Aortic Ratios: Origin: 1.0 Mid:  0.9 Hilum: 0.9 Interlobar: 0.2 Arcuate: 0.1 Left Renal-Aortic Ratios: Origin: 1.5 Mid: 1.1 Hilum: 1.0 Interlobar: 0.1 Arcuate: 0.1 IMPRESSION: No evidence of hydronephrosis. Directed duplex of the right renal artery demonstrates no evidence of significant stenosis. Directed duplex of the left renal artery demonstrates evidence of developing stenosis at the origin. Signed, Dulcy Fanny. Dellia Nims, RPVI [facility] Radiology Electronically Signed   By: Corrie Mckusick D.O.   On: 06/10/2019 16:28   Ct Angio Abd/pel W/ And/or W/o  Result Date: 07/01/2019 CLINICAL DATA:  Malignant hypertension. Suspected left-sided renal artery stenosis on renal Doppler ultrasound performed 06/10/2019 EXAM: CTA ABDOMEN AND PELVIS WITHOUT AND WITH CONTRAST TECHNIQUE: Multidetector CT imaging of the abdomen and pelvis was performed using the standard protocol during bolus administration of intravenous  contrast. Multiplanar reconstructed images and MIPs were obtained and reviewed to evaluate the vascular anatomy. CONTRAST:  2mL OMNIPAQUE IOHEXOL 350 MG/ML SOLN COMPARISON:  Renal Doppler ultrasound-06/10/2019; noncontrast CT abdomen and pelvis - 04/06/2017; 11/15/2009 FINDINGS: VASCULAR Aorta: Minimal amount of mixed calcified and noncalcified atherosclerotic plaque within the normal caliber abdominal aorta, not resulting in a hemodynamically significant stenosis. No evidence of abdominal aortic dissection or periaortic stranding. Celiac: Widely patent without hemodynamically significant narrowing. Conventional branching pattern. SMA: Widely patent without hemodynamically significant narrowing. Conventional branching pattern. The distal tributaries the SMA appear widely patent without discrete intraluminal filling defect suggest distal embolism. Renals: Solitary  bilaterally. There is a minimal amount of mixed calcified and noncalcified atherosclerotic plaque involving the undersurface of the left renal artery (coronal image 84, series 9), not resulting in a hemodynamically significant stenosis. The right renal artery is widely patent without hemodynamically significant narrowing. No discrete areas of vessel irregularity to suggest FMD. IMA: Remains patent. Inflow: The bilateral common and external iliac arteries are tortuous though of normal caliber and widely patent without hemodynamically significant narrowing. The bilateral internal iliac arteries are of normal caliber and widely patent. Proximal Outflow: The bilateral common and imaged portions of the bilateral deep and superficial femoral arteries are widely patent without hemodynamically significant narrowing. Veins: The IVC and pelvic venous systems appear widely patent. Review of the MIP images confirms the above findings. _________________________________________________________ NON-VASCULAR Lower chest: Limited visualization of the lower thorax demonstrates minimal right basilar ground-glass atelectasis. Minimal subsegmental axis within the inferior segment of the lingula. No discrete focal airspace opacities. No pleural effusion. Normal heart size.  No pericardial effusion. Hepatobiliary: Normal hepatic contour. There is a punctate (subcentimeter) hypoattenuating lesion within the subcapsular aspect of the liver adjacent to the gallbladder fossa (image 24, series 11), which is too small to accurately characterize though favored to represent a hepatic cyst. No discrete worrisome hepatic lesions. Normal appearance of the gallbladder given degree distention. No radiopaque gallstones. No intra or extrahepatic biliary ductal dilatation. Note is made of a small periampullary duodenal diverticulum. No ascites. Pancreas: Normal appearance of the pancreas. Spleen: Normal appearance of the spleen. Adrenals/Urinary Tract:  There is symmetric enhancement of the bilateral kidneys. No definite renal stones on this postcontrast examination. Note is made of an approximately 1.7 x 1.5 x 1.5 cm complex potentially enhancing lesion within the posteroinferior pole of the left kidney (coronal image 86, series 7; image 87, series 13; sagittal image 142, series 14), worrisome for a small renal cell carcinoma. No discrete left-sided renal lesions. There is a minimal amount of grossly symmetric likely age and body habitus related perinephric stranding. No urine obstruction. Normal appearance the bilateral adrenal glands. Normal appearance of the urinary bladder given degree of distention. Stomach/Bowel: The sigmoid colon is noted to be redundant. Scattered minimal colonic diverticulosis without evidence of superimposed acute diverticulitis. Moderate colonic stool burden without evidence of enteric obstruction. Normal appearance of the terminal ileum and appendix. Note is made of a small periampullary duodenal diverticulum. No discrete areas of bowel wall thickening. No hiatal hernia. No pneumoperitoneum, pneumatosis or portal venous gas. Lymphatic: Scattered mesenteric lymph nodes clustered within the right lower abdominal quadrant are numerous though individually not enlarged by size criteria with index pericecal lymph node measuring 0.5 cm in greatest short axis diameter (image 68, series 4)., presumably reactive in etiology. No bulky retroperitoneal mesenteric, pelvic or inguinal lymphadenopathy. Reproductive: Borderline prostatomegaly. No free fluid the pelvic cul-de-sac. Other: Post bilateral inguinal hernia repair without  definitive evidence of recurrence. Musculoskeletal: No acute or aggressive osseous abnormalities. Stigmata of DISH within the thoracic spine. Mild-to-moderate the level lumbar spine DDD, worse at L2-L3 with disc space height loss, endplate irregularity and sclerosis. Mild degenerative change the bilateral hips with joint  space loss, subchondral sclerosis osteophytosis. IMPRESSION: VASCULAR 1. Minimal amount of atherosclerotic plaque within a normal caliber abdominal aorta. Aortic Atherosclerosis (ICD10-I70.0). 2. No morphologic evidence of renal artery stenosis with special attention paid to the left renal artery. No evidence of asymmetric renal atrophy or delayed enhancement. NON-VASCULAR 1. Approximately 1.7 cm potentially enhancing lesion involving the posteroinferior aspect of the left kidney (not seen on preceding renal Doppler ultrasound or remote noncontrast abdominal CT scans) which cannot be characterized as a simple renal cyst and appears worrisome for a renal cell carcinoma. Further evaluation with abdominal MRI is advised. These results will be called to the ordering clinician or representative by the Radiologist Assistant, and communication documented in the PACS or zVision Dashboard. Electronically Signed   By: Sandi Mariscal M.D.   On: 07/01/2019 10:06     Assessment/Plan 1. Hypertensive urgency Continue antihypertensive medications as already ordered, these medications have been reviewed and there are no changes at this time.  There is no evidence of renal artery stenosis.  2. Aortic atherosclerosis ([place]) Recommend:  I do not find evidence of life style limiting vascular disease. The patient specifically denies life style limitation.  Previous noninvasive studies including ABI's of the legs do not identify critical vascular problems.  The patient should continue walking and begin a more formal exercise program. The patient should continue his antiplatelet therapy and aggressive treatment of the lipid abnormalities.  The patient should begin wearing graduated compression socks 15-20 mmHg strength to control her mild edema.  Patient will follow-up with me on a PRN basis   3. Renal mass I discussed this with the patient and have set up a Urology consult  4. Uncontrolled type 2 diabetes mellitus  with hyperglycemia ([place]) Continue hypoglycemic medications as already ordered, these medications have been reviewed and there are no changes at this time.  Hgb A1C to be monitored as already arranged by primary service   5. Mixed hyperlipidemia Continue statin as ordered and reviewed, no changes at this time     Hortencia Pilar, MD  07/06/2019 8:33 PM

## 2019-07-06 NOTE — Telephone Encounter (Signed)
Patient returning call to [facility] to schedule a colonoscopy.

## 2019-07-07 ENCOUNTER — Ambulatory Visit (INDEPENDENT_AMBULATORY_CARE_PROVIDER_SITE_OTHER): Payer: PPO | Admitting: Vascular Surgery

## 2019-07-07 ENCOUNTER — Other Ambulatory Visit: Payer: Self-pay

## 2019-07-07 ENCOUNTER — Encounter (INDEPENDENT_AMBULATORY_CARE_PROVIDER_SITE_OTHER): Payer: Self-pay | Admitting: Vascular Surgery

## 2019-07-07 VITALS — BP 177/84 | HR 60 | Resp 16 | Wt 176.0 lb

## 2019-07-07 DIAGNOSIS — I160 Hypertensive urgency: Secondary | ICD-10-CM

## 2019-07-07 DIAGNOSIS — N2889 Other specified disorders of kidney and ureter: Secondary | ICD-10-CM

## 2019-07-07 DIAGNOSIS — Z1211 Encounter for screening for malignant neoplasm of colon: Secondary | ICD-10-CM

## 2019-07-07 DIAGNOSIS — E1165 Type 2 diabetes mellitus with hyperglycemia: Secondary | ICD-10-CM | POA: Diagnosis not present

## 2019-07-07 DIAGNOSIS — I700 Atherosclerosis of aorta: Secondary | ICD-10-CM

## 2019-07-07 DIAGNOSIS — E782 Mixed hyperlipidemia: Secondary | ICD-10-CM | POA: Diagnosis not present

## 2019-07-07 MED ORDER — NA SULFATE-K SULFATE-MG SULF 17.5-3.13-1.6 GM/177ML PO SOLN: 1.0000 | Freq: Once | ORAL | 0 refills | Status: AC

## 2019-07-07 NOTE — Telephone Encounter (Signed)
Gastroenterology Pre-Procedure Review  Request Date: Monday 08/08/19 Requesting Physician: Dr. Allen Norris  PATIENT REVIEW QUESTIONS: The patient responded to the following health history questions as indicated:    1. Are you having any GI issues? yes (Constipation-pt states med induced) 2. Do you have a personal history of Polyps? no 3. Do you have a family history of Colon Cancer or Polyps? no 4. Diabetes Mellitus? yes (controlled with diet) 5. Joint replacements in the past 12 months?no 6. Major health problems in the past 3 months?hypertensive episode Sept 14th 7. Any artificial heart valves, MVP, or defibrillator?no    MEDICATIONS & ALLERGIES:    Patient reports the following regarding taking any anticoagulation/antiplatelet therapy:   Plavix, Coumadin, Eliquis, Xarelto, Lovenox, Pradaxa, Brilinta, or Effient? no Aspirin? yes (81mg  daily)  Patient confirms/reports the following medications:  Current Outpatient Medications  Medication Sig Dispense Refill  . aspirin EC 81 MG tablet Take 81 mg by mouth daily at 12 noon.     Marland Kitchen azelastine (ASTELIN) 0.1 % nasal spray Place into the nose. PRN    . cloNIDine (CATAPRES) 0.1 MG tablet Take by mouth.    . diltiazem (CARDIZEM CD) 120 MG 24 hr capsule     . rosuvastatin (CRESTOR) 5 MG tablet Take 1 tablet by mouth daily.    . valsartan-hydrochlorothiazide (DIOVAN-HCT) 160-25 MG tablet Take 1 tablet by mouth daily. 30 tablet 11   No current facility-administered medications for this visit.     Patient confirms/reports the following allergies:  No Known Allergies  No orders of the defined types were placed in this encounter.   AUTHORIZATION INFORMATION Primary Insurance: 1D#: Group #:  Secondary Insurance: 1D#: Group #:  SCHEDULE INFORMATION: Date: Monday 08/08/19 Time: Location:[facility]

## 2019-07-08 ENCOUNTER — Encounter (INDEPENDENT_AMBULATORY_CARE_PROVIDER_SITE_OTHER): Payer: Self-pay | Admitting: Vascular Surgery

## 2019-07-10 ENCOUNTER — Encounter: Payer: Self-pay | Admitting: Urology

## 2019-07-10 DIAGNOSIS — N2889 Other specified disorders of kidney and ureter: Secondary | ICD-10-CM | POA: Insufficient documentation

## 2019-07-12 DIAGNOSIS — N2889 Other specified disorders of kidney and ureter: Secondary | ICD-10-CM | POA: Diagnosis not present

## 2019-07-12 DIAGNOSIS — E785 Hyperlipidemia, unspecified: Secondary | ICD-10-CM | POA: Diagnosis not present

## 2019-07-12 DIAGNOSIS — R002 Palpitations: Secondary | ICD-10-CM | POA: Diagnosis not present

## 2019-07-12 DIAGNOSIS — I160 Hypertensive urgency: Secondary | ICD-10-CM | POA: Diagnosis not present

## 2019-07-12 DIAGNOSIS — E119 Type 2 diabetes mellitus without complications: Secondary | ICD-10-CM | POA: Diagnosis not present

## 2019-07-15 ENCOUNTER — Ambulatory Visit (INDEPENDENT_AMBULATORY_CARE_PROVIDER_SITE_OTHER): Payer: PPO | Admitting: Urology

## 2019-07-15 ENCOUNTER — Encounter: Payer: Self-pay | Admitting: Urology

## 2019-07-15 ENCOUNTER — Other Ambulatory Visit: Payer: Self-pay

## 2019-07-15 VITALS — BP 207/93 | HR 79 | Ht 68.0 in | Wt 172.3 lb

## 2019-07-15 DIAGNOSIS — N486 Induration penis plastica: Secondary | ICD-10-CM | POA: Diagnosis not present

## 2019-07-15 DIAGNOSIS — N2889 Other specified disorders of kidney and ureter: Secondary | ICD-10-CM | POA: Diagnosis not present

## 2019-07-16 ENCOUNTER — Encounter: Payer: Self-pay | Admitting: Urology

## 2019-07-16 DIAGNOSIS — N486 Induration penis plastica: Secondary | ICD-10-CM | POA: Insufficient documentation

## 2019-07-16 NOTE — Progress Notes (Signed)
07/15/2019 12:54 PM   [name] [birth year] [id number]  Referring provider: Sofie Hartigan, MD [place],  Marcus Huang [id number]  Chief Complaint  Patient presents with   Establish Care    penile growth     HPI: Marcus Huang is a 68 y.o. male seen in consultation at the request of Dr. Ellison Hughs for an abnormality of the penile shaft.  Approximately 2 months ago he noted a firm area at the base of his penis.  It is not tender and he denies pain or curvature with erections.  The area has not changed in size.  He has no voiding complaints.  Denies prior history of urologic problems.  He was also having labile hypertension at the time of the referral and was briefly hospitalized in mid September.  Duplex ultrasound of the renal arteries showed no evidence of stenosis.  He had a follow-up with Dr. Delana Meyer who ordered a CT angio abdomen/pelvis which incidentally discovered a 1.7 x 1.5 x 1.5 cm complex lesion of the left inferior pole posteriorly that was potentially enhancing.  PMH: Past Medical History:  Diagnosis Date   Carotid artery thrombosis    Left Carotid Artery   Diabetes ([facility])    Diverticulosis    GERD (gastroesophageal reflux disease)    History of kidney stones    Hyperlipidemia    Hypertension    Sleep apnea    DOES NOT USE C-PAP    Surgical History: Past Surgical History:  Procedure Laterality Date   EYE SURGERY Bilateral    LASIK EYE SURGERY   INGUINAL HERNIA REPAIR Bilateral 05/12/2017   Procedure: LAPAROSCOPIC BILATERAL INGUINAL HERNIA REPAIR, umbilical hernia repair;  Surgeon: Florene Glen, MD;  Location: [facility] ORS;  Service: General;  Laterality: Bilateral;   none     TONSILLECTOMY      Home Medications:  Allergies as of 07/15/2019   No Known Allergies     Medication List       Accurate as of July 15, 2019 11:59 PM. If you have any questions, ask your nurse or doctor.        aspirin EC 81 MG tablet Take 81 mg  by mouth daily at 12 noon.   azelastine 0.1 % nasal spray Commonly known as: ASTELIN Place into the nose. PRN   cloNIDine 0.1 MG tablet Commonly known as: CATAPRES Take by mouth.   diltiazem 120 MG 24 hr capsule Commonly known as: CARDIZEM CD   losartan-hydrochlorothiazide 100-12.5 MG tablet Commonly known as: HYZAAR Take by mouth.   rosuvastatin 5 MG tablet Commonly known as: CRESTOR Take 1 tablet by mouth daily.   Suprep Bowel Prep Kit 17.5-3.13-1.6 GM/177ML Soln Generic drug: Na Sulfate-K Sulfate-Mg Sulf   valsartan-hydrochlorothiazide 160-25 MG tablet Commonly known as: DIOVAN-HCT Take 1 tablet by mouth daily.       Allergies: No Known Allergies  Family History: Family History  Problem Relation Age of Onset   Hypertension Father    Stroke Father    Coronary artery disease Maternal Grandmother    Stroke Maternal Grandmother    Diabetes Mother    Hypertension Mother    Stroke Mother    Stroke Paternal Grandmother    Prostate cancer Neg Hx    Bladder Cancer Neg Hx     Social History:  reports that he has never smoked. He has never used smokeless tobacco. He reports that he does not drink alcohol or use drugs.  ROS: UROLOGY Frequent Urination?:  No Hard to postpone urination?: No Burning/pain with urination?: No Get up at night to urinate?: No Leakage of urine?: No Urine stream starts and stops?: No Trouble starting stream?: No Do you have to strain to urinate?: No Blood in urine?: No Urinary tract infection?: No Sexually transmitted disease?: No Injury to kidneys or bladder?: No Painful intercourse?: No Weak stream?: No Erection problems?: No Penile pain?: No  Gastrointestinal Nausea?: No Vomiting?: No Indigestion/heartburn?: No Diarrhea?: No Constipation?: No  Constitutional Fever: No Night sweats?: No Weight loss?: No Fatigue?: No  Skin Skin rash/lesions?: No Itching?: No  Eyes Blurred vision?: No Double vision?:  No  Ears/Nose/Throat Sore throat?: No Sinus problems?: No  Hematologic/Lymphatic Swollen glands?: No Easy bruising?: No  Cardiovascular Leg swelling?: No Chest pain?: No  Respiratory Cough?: No Shortness of breath?: No  Endocrine Excessive thirst?: No  Musculoskeletal Back pain?: No Joint pain?: No  Neurological Headaches?: No Dizziness?: No  Psychologic Depression?: No Anxiety?: No  Physical Exam: BP (!) 207/93 (BP Location: Left Arm, Patient Position: Sitting, Cuff Size: Normal)    Pulse 79    Ht 5' 8" (1.727 m)    Wt 172 lb 4.8 oz (78.2 kg)    BMI 26.20 kg/m   Constitutional:  Alert and oriented, No acute distress. HEENT: [place] AT, moist mucus membranes.  Trachea midline, no masses. Cardiovascular: No clubbing, cyanosis, or edema. Respiratory: Normal respiratory effort, no increased work of breathing. GI: Abdomen is soft, nontender, nondistended, no abdominal masses GU: Phallus circumcised.  There is an ~1 cm left dorsal plaque at the proximal shaft.  Nontender. Lymph: No cervical or inguinal lymphadenopathy. Skin: No rashes, bruises or suspicious lesions. Neurologic: Grossly intact, no focal deficits, moving all 4 extremities. Psychiatric: Normal mood and affect.   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  He is presently asymptomatic and will monitor.  - Incidental left renal mass Suspicious for a small enhancing renal mass statistically a probable renal cell carcinoma.  We discussed current management of small renal masses including surveillance, surgical excision and percutaneous ablation as well as renal mass biopsy for risk stratification.  A renal mass protocol MRI was ordered and he will be notified with results and further discussion of management options.   Abbie Sons, [place] [address], [place], [place] [address] [phone]

## 2019-07-18 DIAGNOSIS — G4733 Obstructive sleep apnea (adult) (pediatric): Secondary | ICD-10-CM | POA: Diagnosis not present

## 2019-07-18 DIAGNOSIS — I10 Essential (primary) hypertension: Secondary | ICD-10-CM | POA: Diagnosis not present

## 2019-07-18 DIAGNOSIS — E669 Obesity, unspecified: Secondary | ICD-10-CM | POA: Diagnosis not present

## 2019-07-20 ENCOUNTER — Telehealth: Payer: Self-pay

## 2019-07-20 NOTE — Telephone Encounter (Signed)
Returned patients call regarding colonoscopy date.  LVM informing him that from what I see he is still on for Monday 11/30 at [facility] with Dr. Allen Norris.    Thanks [employer]

## 2019-07-22 DIAGNOSIS — H2513 Age-related nuclear cataract, bilateral: Secondary | ICD-10-CM | POA: Diagnosis not present

## 2019-07-28 ENCOUNTER — Other Ambulatory Visit: Payer: Self-pay

## 2019-07-28 ENCOUNTER — Ambulatory Visit
Admission: RE | Admit: 2019-07-28 | Discharge: 2019-07-28 | Disposition: A | Discharge status: Home / Self Care | Payer: PPO | Source: Ambulatory Visit | Attending: Urology | Admitting: Urology

## 2019-07-28 DIAGNOSIS — N2889 Other specified disorders of kidney and ureter: Secondary | ICD-10-CM | POA: Diagnosis not present

## 2019-07-28 DIAGNOSIS — N289 Disorder of kidney and ureter, unspecified: Secondary | ICD-10-CM | POA: Diagnosis not present

## 2019-07-28 LAB — POCT I-STAT CREATININE: Creatinine, Ser: 1 mg/dL (ref 0.61–1.24)

## 2019-07-28 MED ORDER — GADOBUTROL 1 MMOL/ML IV SOLN
7.0000 mL | Freq: Once | INTRAVENOUS | Status: AC | PRN
  Administered 2019-07-28: 09:00:00 7 mL via INTRAVENOUS

## 2019-08-02 ENCOUNTER — Encounter: Payer: Self-pay | Admitting: Urology

## 2019-08-04 ENCOUNTER — Other Ambulatory Visit: Payer: Self-pay | Admitting: Urology

## 2019-08-04 DIAGNOSIS — N2889 Other specified disorders of kidney and ureter: Secondary | ICD-10-CM

## 2019-08-08 ENCOUNTER — Other Ambulatory Visit: Payer: Self-pay

## 2019-08-08 ENCOUNTER — Encounter: Payer: Self-pay | Admitting: *Deleted

## 2019-08-10 NOTE — Discharge Instructions (Signed)

## 2019-08-12 ENCOUNTER — Other Ambulatory Visit
Admission: RE | Admit: 2019-08-12 | Discharge: 2019-08-12 | Disposition: A | Discharge status: Home / Self Care | Payer: PPO | Source: Ambulatory Visit | Attending: Gastroenterology | Admitting: Gastroenterology

## 2019-08-12 DIAGNOSIS — Z01812 Encounter for preprocedural laboratory examination: Secondary | ICD-10-CM | POA: Insufficient documentation

## 2019-08-12 DIAGNOSIS — Z20828 Contact with and (suspected) exposure to other viral communicable diseases: Secondary | ICD-10-CM | POA: Diagnosis not present

## 2019-08-13 LAB — SARS CORONAVIRUS 2 (TAT 6-24 HRS): SARS Coronavirus 2: NEGATIVE

## 2019-08-15 ENCOUNTER — Ambulatory Visit: Payer: PPO | Admitting: Anesthesiology

## 2019-08-15 ENCOUNTER — Other Ambulatory Visit: Payer: Self-pay

## 2019-08-15 ENCOUNTER — Ambulatory Visit
Admission: RE | Admit: 2019-08-15 | Discharge: 2019-08-15 | Disposition: A | Discharge status: Home / Self Care | Payer: PPO | Attending: Gastroenterology | Admitting: Gastroenterology

## 2019-08-15 ENCOUNTER — Encounter
Admission: RE | Disposition: A | Discharge status: Home / Self Care | Payer: Self-pay | Source: Home / Self Care | Attending: Gastroenterology

## 2019-08-15 DIAGNOSIS — G4730 Sleep apnea, unspecified: Secondary | ICD-10-CM | POA: Insufficient documentation

## 2019-08-15 DIAGNOSIS — Z1211 Encounter for screening for malignant neoplasm of colon: Secondary | ICD-10-CM | POA: Diagnosis not present

## 2019-08-15 DIAGNOSIS — Z79899 Other long term (current) drug therapy: Secondary | ICD-10-CM | POA: Insufficient documentation

## 2019-08-15 DIAGNOSIS — Z7982 Long term (current) use of aspirin: Secondary | ICD-10-CM | POA: Insufficient documentation

## 2019-08-15 DIAGNOSIS — I10 Essential (primary) hypertension: Secondary | ICD-10-CM | POA: Diagnosis not present

## 2019-08-15 DIAGNOSIS — E785 Hyperlipidemia, unspecified: Secondary | ICD-10-CM | POA: Insufficient documentation

## 2019-08-15 DIAGNOSIS — Z8249 Family history of ischemic heart disease and other diseases of the circulatory system: Secondary | ICD-10-CM | POA: Diagnosis not present

## 2019-08-15 DIAGNOSIS — E119 Type 2 diabetes mellitus without complications: Secondary | ICD-10-CM | POA: Insufficient documentation

## 2019-08-15 HISTORY — PX: COLONOSCOPY WITH PROPOFOL: SHX5780

## 2019-08-15 HISTORY — DX: Other specified disorders of kidney and ureter: N28.89

## 2019-08-15 SURGERY — COLONOSCOPY WITH PROPOFOL
Anesthesia: General | Site: Rectum

## 2019-08-15 MED ORDER — LIDOCAINE HCL (CARDIAC) PF 100 MG/5ML IV SOSY
PREFILLED_SYRINGE | INTRAVENOUS | Status: DC | PRN
  Administered 2019-08-15: 30 mg via INTRAVENOUS

## 2019-08-15 MED ORDER — STERILE WATER FOR IRRIGATION IR SOLN
Status: DC | PRN
  Administered 2019-08-15: 50 mL

## 2019-08-15 MED ORDER — LACTATED RINGERS IV SOLN
10.0000 mL/h | INTRAVENOUS | Status: DC
  Administered 2019-08-15: 10 mL/h via INTRAVENOUS

## 2019-08-15 MED ORDER — PROPOFOL 10 MG/ML IV BOLUS
INTRAVENOUS | Status: DC | PRN
  Administered 2019-08-15: 50 mg via INTRAVENOUS
  Administered 2019-08-15: 100 mg via INTRAVENOUS
  Administered 2019-08-15 (×4): 50 mg via INTRAVENOUS

## 2019-08-15 MED ORDER — ACETAMINOPHEN 325 MG PO TABS: 325.0000 mg | ORAL_TABLET | Freq: Once | ORAL | Status: DC

## 2019-08-15 MED ORDER — ACETAMINOPHEN 160 MG/5ML PO SOLN: 325.0000 mg | Freq: Once | ORAL | Status: DC

## 2019-08-15 SURGICAL SUPPLY — 5 items
CANISTER SUCT 1200ML W/VALVE (MISCELLANEOUS) ×3 IMPLANT
GOWN CVR UNV OPN BCK APRN NK (MISCELLANEOUS) ×2 IMPLANT
GOWN ISOL THUMB LOOP REG UNIV (MISCELLANEOUS) ×4
KIT ENDO PROCEDURE OLY (KITS) ×3 IMPLANT
WATER STERILE IRR 250ML POUR (IV SOLUTION) ×3 IMPLANT

## 2019-08-15 NOTE — Op Note (Signed)
[facility] Gastroenterology Patient Name: Marcus Huang Procedure Date: 08/15/2019 10:47 AM MRN: [id number] Account #: [id number] Date of Birth: [birth year] Admit Type: Outpatient Age: 68 Room: [facility] OR ROOM 01 Gender: Male Note Status: Finalized Procedure:             Colonoscopy Indications:           Screening for colorectal malignant neoplasm Providers:             Lucilla Lame MD, MD Referring MD:          Sofie Hartigan (Referring MD) Medicines:             Propofol per Anesthesia Complications:         No immediate complications. Procedure:             Pre-Anesthesia Assessment:                        - Prior to the procedure, a History and Physical was                         performed, and patient medications and allergies were                         reviewed. The patient's tolerance of previous                         anesthesia was also reviewed. The risks and benefits                         of the procedure and the sedation options and risks                         were discussed with the patient. All questions were                         answered, and informed consent was obtained. Prior                         Anticoagulants: The patient has taken no previous                         anticoagulant or antiplatelet agents. ASA Grade                         Assessment: II - A patient with mild systemic disease.                         After reviewing the risks and benefits, the patient                         was deemed in satisfactory condition to undergo the                         procedure.                        After obtaining informed consent, the colonoscope was  passed under direct vision. Throughout the procedure,                         the patient's blood pressure, pulse, and oxygen                         saturations were monitored continuously. The was                         introduced through the anus and  advanced to the the                         cecum, identified by appendiceal orifice and ileocecal                         valve. The colonoscopy was performed without                         difficulty. The patient tolerated the procedure well.                         The quality of the bowel preparation was excellent. Findings:      The perianal and digital rectal examinations were normal.      The entire examined colon appeared normal. Impression:            - The entire examined colon is normal.                        - No specimens collected. Recommendation:        - Discharge patient to home.                        - Resume previous diet.                        - Continue present medications. Procedure Code(s):     --- Professional ---                        [phone], Colonoscopy, flexible; diagnostic, including                         collection of specimen(s) by brushing or washing, when                         performed (separate procedure) Diagnosis Code(s):     --- Professional ---                        Z12.11, Encounter for screening for malignant neoplasm                         of colon CPT copyright 2019 American Medical Association. All rights reserved. The codes documented in this report are preliminary and upon coder review may  be revised to meet current compliance requirements. Lucilla Lame MD, MD 08/15/2019 11:07:49 AM This report has been signed electronically. Number of Addenda: 0 Note Initiated On: 08/15/2019 10:47 AM Scope Withdrawal Time: 0 hours 6 minutes 16 seconds  Total Procedure Duration: 0 hours 10 minutes 12 seconds  Estimated Blood  Loss:  Estimated blood loss: none. Estimated blood loss: none.      [facility]

## 2019-08-15 NOTE — Anesthesia Preprocedure Evaluation (Signed)
Anesthesia Evaluation  Patient identified by MRN, date of birth, ID band Patient awake    Reviewed: Allergy & Precautions, H&P , NPO status , Patient's Chart, lab work & pertinent test results  Airway Mallampati: II  TM Distance: >3 FB Neck ROM: full    Dental no notable dental hx.    Pulmonary sleep apnea and Continuous Positive Airway Pressure Ventilation ,    Pulmonary exam normal breath sounds clear to auscultation       Cardiovascular hypertension, Normal cardiovascular exam Rhythm:regular Rate:Normal     Neuro/Psych    GI/Hepatic GERD  ,  Endo/Other  diabetes  Renal/GU      Musculoskeletal   Abdominal   Peds  Hematology   Anesthesia Other Findings   Reproductive/Obstetrics                             Anesthesia Physical Anesthesia Plan  ASA: II  Anesthesia Plan: General   Post-op Pain Management:    Induction: Intravenous  PONV Risk Score and Plan: 2 and Propofol infusion, Treatment may vary due to age or medical condition and TIVA  Airway Management Planned: Natural Airway  Additional Equipment:   Intra-op Plan:   Post-operative Plan:   Informed Consent: I have reviewed the patients History and Physical, chart, labs and discussed the procedure including the risks, benefits and alternatives for the proposed anesthesia with the patient or authorized representative who has indicated his/her understanding and acceptance.       Plan Discussed with: CRNA  Anesthesia Plan Comments:         Anesthesia Quick Evaluation

## 2019-08-15 NOTE — H&P (Signed)
Lucilla Lame, MD [address] [place],  [address] Phone: [phone] Fax : [phone]  Primary Care Physician:  Sofie Hartigan, MD Primary Gastroenterologist:  Dr. Allen Norris  Pre-Procedure History & Physical: HPI:  Marcus Huang is a 68 y.o. male is here for a screening colonoscopy.   Past Medical History:  Diagnosis Date  . Carotid artery thrombosis    Left Carotid Artery  . Diabetes ([place])   . Diverticulosis   . GERD (gastroesophageal reflux disease)   . History of kidney stones   . Hyperlipidemia   . Hypertension   . Renal mass   . Sleep apnea    C-PAP    Past Surgical History:  Procedure Laterality Date  . EYE SURGERY Bilateral    LASIK EYE SURGERY  . INGUINAL HERNIA REPAIR Bilateral 05/12/2017   Procedure: LAPAROSCOPIC BILATERAL INGUINAL HERNIA REPAIR, umbilical hernia repair;  Surgeon: Florene Glen, MD;  Location: [facility] ORS;  Service: General;  Laterality: Bilateral;  . none    . TONSILLECTOMY      Prior to Admission medications   Medication Sig Start Date End Date Taking? Authorizing Provider  aspirin EC 81 MG tablet Take 81 mg by mouth daily at 12 noon.    Yes [provider]  cloNIDine (CATAPRES) 0.1 MG tablet Take by mouth. 06/03/19 06/02/20 Yes [provider]  diltiazem (CARDIZEM CD) 120 MG 24 hr capsule  06/08/19  Yes [provider]  losartan-hydrochlorothiazide (HYZAAR) 100-12.5 MG tablet Take by mouth. 07/12/19 07/11/20 Yes [provider]  Multiple Vitamin (MULTIVITAMIN) tablet Take 1 tablet by mouth daily.   Yes [provider]  rosuvastatin (CRESTOR) 5 MG tablet Take 1 tablet by mouth daily. 03/04/19  Yes [provider]  [place] KIT 17.5-3.13-1.6 GM/177ML SOLN  07/07/19  Yes [provider]  VITAMIN D PO Take by mouth daily.   Yes [provider]  azelastine (ASTELIN) 0.1 % nasal spray Place into the nose. PRN 08/04/18   [provider]   valsartan-hydrochlorothiazide (DIOVAN-HCT) 160-25 MG tablet Take 1 tablet by mouth daily. Patient not taking: Reported on 08/08/2019 06/01/19 05/31/20  [name], MD    Allergies as of 07/07/2019  . (No Known Allergies)    Family History  Problem Relation Age of Onset  . Hypertension Father   . Stroke Father   . Coronary artery disease Maternal Grandmother   . Stroke Maternal Grandmother   . Diabetes Mother   . Hypertension Mother   . Stroke Mother   . Stroke Paternal Grandmother   . Prostate cancer Neg Hx   . Bladder Cancer Neg Hx     Social History   Socioeconomic History  . Marital status: Married    Spouse name: Not on file  . Number of children: Not on file  . Years of education: Not on file  . Highest education level: Not on file  Occupational History  . Not on file  Social Needs  . Financial resource strain: Not on file  . Food insecurity    Worry: Not on file    Inability: Not on file  . Transportation needs    Medical: Not on file    Non-medical: Not on file  Tobacco Use  . Smoking status: Never Smoker  . Smokeless tobacco: Never Used  Substance and Sexual Activity  . Alcohol use: No  . Drug use: No  . Sexual activity: Yes    Birth control/protection: None  Lifestyle  .  Physical activity    Days per week: Not on file    Minutes per session: Not on file  . Stress: Not on file  Relationships  . Social Herbalist on phone: Not on file    Gets together: Not on file    Attends religious service: Not on file    Active member of club or organization: Not on file    Attends meetings of clubs or organizations: Not on file    Relationship status: Not on file  . Intimate partner violence    Fear of current or ex partner: Not on file    Emotionally abused: Not on file    Physically abused: Not on file    Forced sexual activity: Not on file  Other Topics Concern  . Not on file  Social History Narrative  . Not on file    Review of  Systems: See HPI, otherwise negative ROS  Physical Exam: BP (!) 179/90   Pulse 84   Temp 97.9 F (36.6 C) (Temporal)   Resp 16   Ht 5' 8" (1.727 m)   Wt 77 kg   SpO2 100%   BMI 25.82 kg/m  General:   Alert,  pleasant and cooperative in NAD Head:  Normocephalic and atraumatic. Neck:  Supple; no masses or thyromegaly. Lungs:  Clear throughout to auscultation.    Heart:  Regular rate and rhythm. Abdomen:  Soft, nontender and nondistended. Normal bowel sounds, without guarding, and without rebound.   Neurologic:  Alert and  oriented x4;  grossly normal neurologically.  Impression/Plan: Marcus Huang is now here to undergo a screening colonoscopy.  Risks, benefits, and alternatives regarding colonoscopy have been reviewed with the patient.  Questions have been answered.  All parties agreeable.

## 2019-08-15 NOTE — Transfer of Care (Signed)
Immediate Anesthesia Transfer of Care Note  Patient: Marcus Huang  Procedure(s) Performed: COLONOSCOPY WITH PROPOFOL (N/A Rectum)  Patient Location: PACU  Anesthesia Type: General  Level of Consciousness: awake, alert  and patient cooperative  Airway and Oxygen Therapy: Patient Spontanous Breathing and Patient connected to supplemental oxygen  Post-op Assessment: Post-op Vital signs reviewed, Patient's Cardiovascular Status Stable, Respiratory Function Stable, Patent Airway and No signs of Nausea or vomiting  Post-op Vital Signs: Reviewed and stable  Complications: No apparent anesthesia complications

## 2019-08-15 NOTE — Anesthesia Postprocedure Evaluation (Signed)
Anesthesia Post Note  Patient: Marcus Huang  Procedure(s) Performed: COLONOSCOPY WITH PROPOFOL (N/A Rectum)     Patient location during evaluation: PACU Anesthesia Type: General Level of consciousness: awake and alert and oriented Pain management: satisfactory to patient Vital Signs Assessment: post-procedure vital signs reviewed and stable Respiratory status: spontaneous breathing, nonlabored ventilation and respiratory function stable Cardiovascular status: blood pressure returned to baseline and stable Postop Assessment: Adequate PO intake and No signs of nausea or vomiting Anesthetic complications: no    Raliegh Ip

## 2019-08-22 DIAGNOSIS — I10 Essential (primary) hypertension: Secondary | ICD-10-CM | POA: Diagnosis not present

## 2019-08-25 DIAGNOSIS — N2889 Other specified disorders of kidney and ureter: Secondary | ICD-10-CM | POA: Diagnosis not present

## 2019-08-25 DIAGNOSIS — I10 Essential (primary) hypertension: Secondary | ICD-10-CM | POA: Diagnosis not present

## 2019-10-11 DIAGNOSIS — G4733 Obstructive sleep apnea (adult) (pediatric): Secondary | ICD-10-CM | POA: Diagnosis not present

## 2019-11-11 DIAGNOSIS — G4733 Obstructive sleep apnea (adult) (pediatric): Secondary | ICD-10-CM | POA: Diagnosis not present

## 2019-12-01 ENCOUNTER — Other Ambulatory Visit: Payer: Self-pay

## 2019-12-01 ENCOUNTER — Ambulatory Visit
Admission: RE | Admit: 2019-12-01 | Discharge: 2019-12-01 | Disposition: A | Discharge status: Home / Self Care | Payer: PPO | Source: Ambulatory Visit | Attending: Urology | Admitting: Urology

## 2019-12-01 DIAGNOSIS — N2889 Other specified disorders of kidney and ureter: Secondary | ICD-10-CM | POA: Insufficient documentation

## 2019-12-01 DIAGNOSIS — K7689 Other specified diseases of liver: Secondary | ICD-10-CM | POA: Diagnosis not present

## 2019-12-01 LAB — POCT I-STAT CREATININE: Creatinine, Ser: 0.9 mg/dL (ref 0.61–1.24)

## 2019-12-01 MED ORDER — GADOBUTROL 1 MMOL/ML IV SOLN
7.0000 mL | Freq: Once | INTRAVENOUS | Status: AC | PRN
  Administered 2019-12-01: 7 mL via INTRAVENOUS

## 2019-12-02 ENCOUNTER — Telehealth: Payer: Self-pay | Admitting: Urology

## 2019-12-02 ENCOUNTER — Ambulatory Visit: Payer: PPO

## 2019-12-02 DIAGNOSIS — N2889 Other specified disorders of kidney and ureter: Secondary | ICD-10-CM

## 2019-12-02 NOTE — Telephone Encounter (Signed)
Marcus Huang has a 88mm left lower pole enhancing cystic renal mass and has elected active surveillance.  MRI performed yesterday shows stable appearance at 16 mm.  We discussed these results and again reviewed the options of continued surveillance versus surgical management.  He would like to continue surveillance for now.  We will plan on a follow-up MRI with office visit in 6 months

## 2019-12-09 DIAGNOSIS — G4733 Obstructive sleep apnea (adult) (pediatric): Secondary | ICD-10-CM | POA: Diagnosis not present

## 2019-12-29 DIAGNOSIS — E785 Hyperlipidemia, unspecified: Secondary | ICD-10-CM | POA: Diagnosis not present

## 2019-12-29 DIAGNOSIS — I10 Essential (primary) hypertension: Secondary | ICD-10-CM | POA: Diagnosis not present

## 2019-12-29 DIAGNOSIS — E119 Type 2 diabetes mellitus without complications: Secondary | ICD-10-CM | POA: Diagnosis not present

## 2020-01-04 DIAGNOSIS — G4733 Obstructive sleep apnea (adult) (pediatric): Secondary | ICD-10-CM | POA: Diagnosis not present

## 2020-01-05 DIAGNOSIS — E119 Type 2 diabetes mellitus without complications: Secondary | ICD-10-CM | POA: Diagnosis not present

## 2020-01-05 DIAGNOSIS — M5442 Lumbago with sciatica, left side: Secondary | ICD-10-CM | POA: Diagnosis not present

## 2020-01-05 DIAGNOSIS — E785 Hyperlipidemia, unspecified: Secondary | ICD-10-CM | POA: Diagnosis not present

## 2020-01-05 DIAGNOSIS — I10 Essential (primary) hypertension: Secondary | ICD-10-CM | POA: Diagnosis not present

## 2020-01-05 DIAGNOSIS — I700 Atherosclerosis of aorta: Secondary | ICD-10-CM | POA: Diagnosis not present

## 2020-01-05 DIAGNOSIS — G4733 Obstructive sleep apnea (adult) (pediatric): Secondary | ICD-10-CM | POA: Diagnosis not present

## 2020-01-05 DIAGNOSIS — G8929 Other chronic pain: Secondary | ICD-10-CM | POA: Diagnosis not present

## 2020-01-09 DIAGNOSIS — G4733 Obstructive sleep apnea (adult) (pediatric): Secondary | ICD-10-CM | POA: Diagnosis not present

## 2020-02-08 DIAGNOSIS — G4733 Obstructive sleep apnea (adult) (pediatric): Secondary | ICD-10-CM | POA: Diagnosis not present

## 2020-03-10 DIAGNOSIS — G4733 Obstructive sleep apnea (adult) (pediatric): Secondary | ICD-10-CM | POA: Diagnosis not present

## 2020-03-14 DIAGNOSIS — I6529 Occlusion and stenosis of unspecified carotid artery: Secondary | ICD-10-CM | POA: Diagnosis not present

## 2020-03-14 DIAGNOSIS — I160 Hypertensive urgency: Secondary | ICD-10-CM | POA: Diagnosis not present

## 2020-03-14 DIAGNOSIS — Z1211 Encounter for screening for malignant neoplasm of colon: Secondary | ICD-10-CM | POA: Insufficient documentation

## 2020-03-14 DIAGNOSIS — I10 Essential (primary) hypertension: Secondary | ICD-10-CM | POA: Diagnosis not present

## 2020-03-14 DIAGNOSIS — I700 Atherosclerosis of aorta: Secondary | ICD-10-CM | POA: Diagnosis not present

## 2020-03-14 DIAGNOSIS — R002 Palpitations: Secondary | ICD-10-CM | POA: Diagnosis not present

## 2020-03-14 DIAGNOSIS — E785 Hyperlipidemia, unspecified: Secondary | ICD-10-CM | POA: Diagnosis not present

## 2020-04-02 DIAGNOSIS — R002 Palpitations: Secondary | ICD-10-CM | POA: Diagnosis not present

## 2020-04-09 DIAGNOSIS — G4733 Obstructive sleep apnea (adult) (pediatric): Secondary | ICD-10-CM | POA: Diagnosis not present

## 2020-04-18 IMAGING — MR MR ABDOMEN WO/W CM
17 series · 48 of 48 positions shown · IV contrast (7ml Gadavist)
Comparison: CT 07/01/2019

CLINICAL DATA: Indeterminate renal lesion identified on CT. MRI
recommended further evaluation.

EXAM:
MRI ABDOMEN WITHOUT AND WITH CONTRAST
TECHNIQUE: Multiplanar multisequence MR imaging of the abdomen was performed
both before and after the administration of intravenous contrast.
CONTRAST:  7mL GADAVIST GADOBUTROL 1 MMOL/ML IV SOLN

[Series 3: T2 · coronal · 6.0mm · 1.19mm/px · 2 of 33 slices shown (1 of 2)]
[im 1/33]
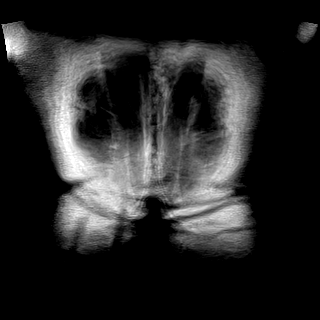
[im 33/33]
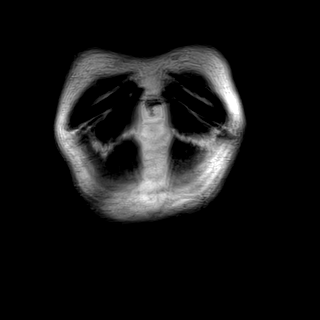

[Series 5: T2 · axial · 6.5mm · 1.19mm/px · z∈[-106,+167]mm · 2 of 36 slices shown (2 of 2)]
[im 1/36]
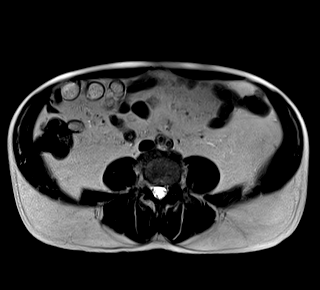
[im 36/36]
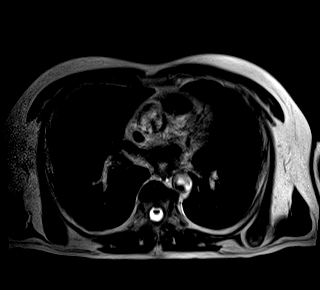

[Series 7: T2 fat-sat · axial · 6.5mm · 1.19mm/px · z∈[-106,+167]mm · 2 of 36 slices shown]
[im 1/36]
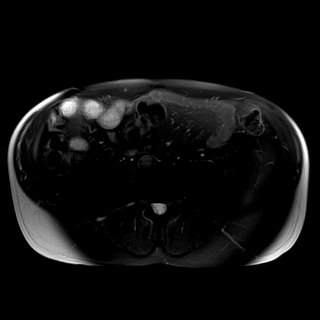
[im 36/36]
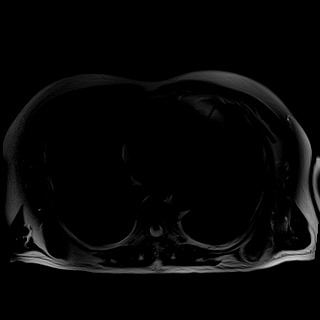

[Series 8: ax dwi_tracew · axial · 6.5mm · 1.42mm/px · z∈[-106,+167]mm · 5 of 108 slices shown]
[im 1/108]
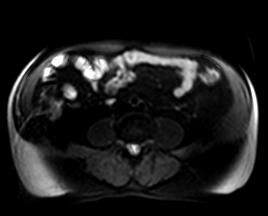
[im 27/108]
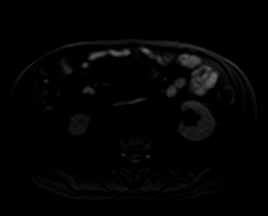
[im 54/108]
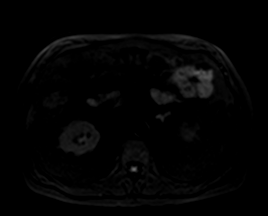
[im 81/108]
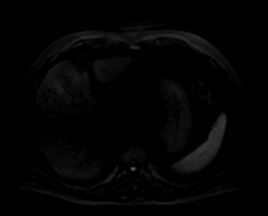
[im 108/108]
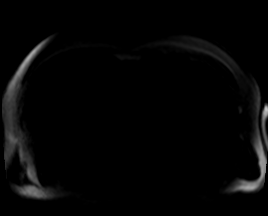

[Series 9: ax dwi_adc · axial · 6.5mm · 1.42mm/px · z∈[-106,+167]mm · 2 of 36 slices shown]
[im 1/36]
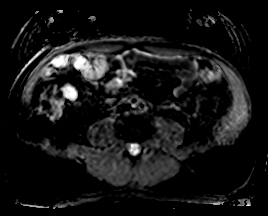
[im 36/36]
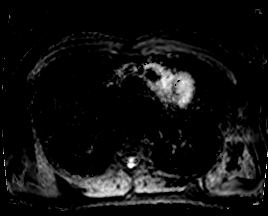

[Series 11: T1 · axial · 6.5mm · 0.74mm/px · z∈[-106,+167]mm · 4 of 72 slices shown]
[im 1/72]
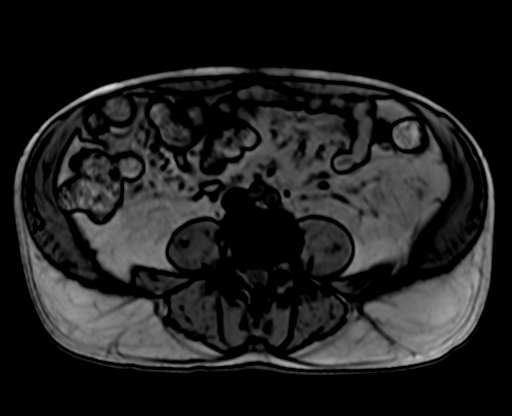
[im 24/72]
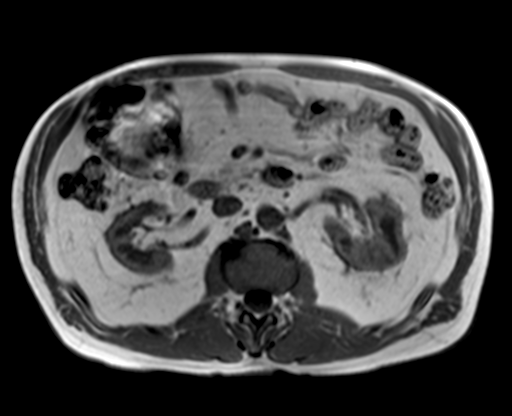
[im 48/72]
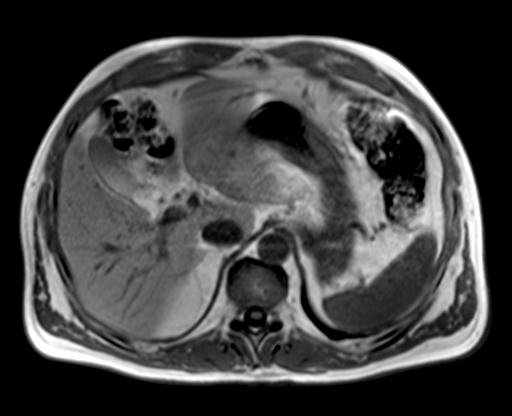
[im 72/72]
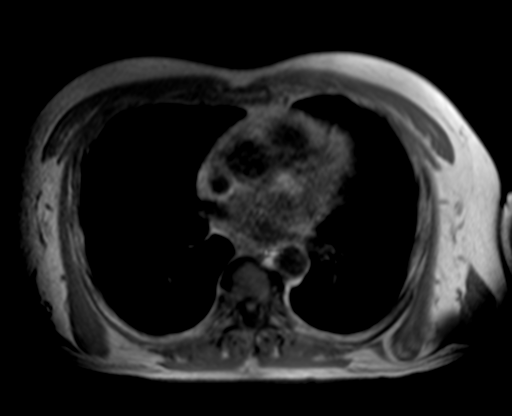

[Series 12: bSSFP · axial · 6.5mm · 0.74mm/px · 1 of 36 slices shown]
[im 1/36]
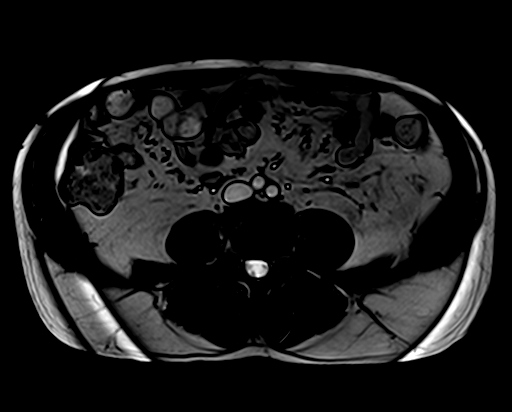

[Series 13: T1 dynamic fat-sat · axial · non-contrast · 3.0mm · 1.19mm/px · z∈[-92,+169]mm · 3 of 88 slices shown (1 of 5)]
[im 1/88]
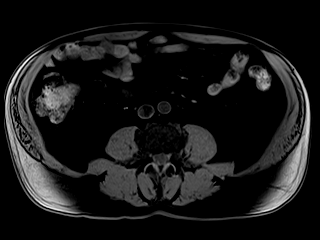
[im 44/88]
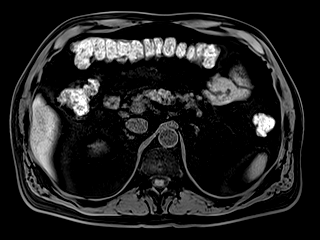
[im 88/88]
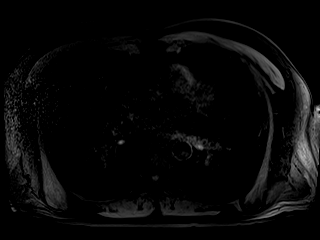

[Series 14: T1 dynamic fat-sat post-contrast · axial · 3.0mm · 1.19mm/px · z∈[-92,+169]mm · 3 of 88 slices shown (1 of 4)]
[im 1/88]
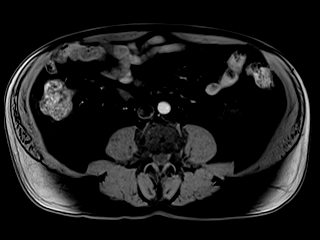
[im 44/88]
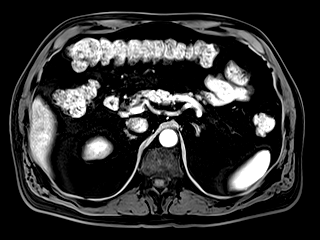
[im 88/88]
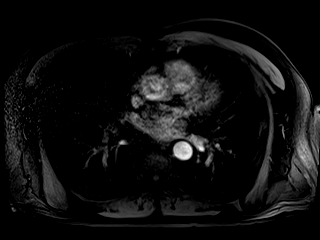

[Series 15: T1 dynamic fat-sat · axial · 3.0mm · 1.19mm/px · z∈[-92,+169]mm · 3 of 88 slices shown (2 of 5)]
[im 1/88]
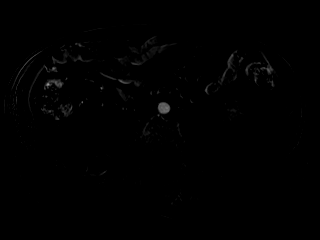
[im 44/88]
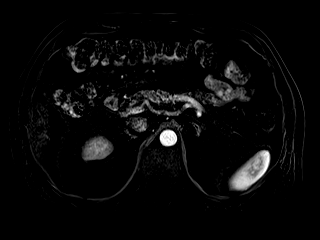
[im 88/88]
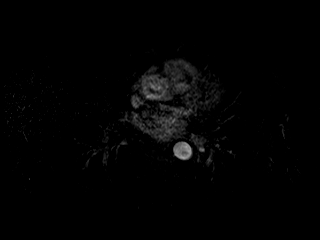

[Series 16: T1 dynamic fat-sat post-contrast · axial · 3.0mm · 1.19mm/px · z∈[-92,+169]mm · 3 of 88 slices shown (2 of 4)]
[im 1/88]
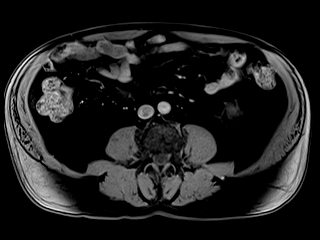
[im 44/88]
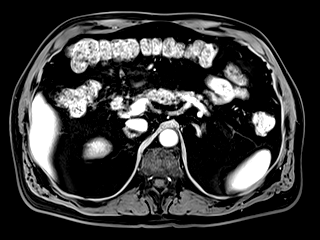
[im 88/88]
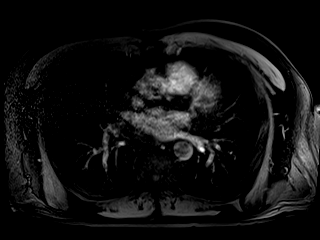

[Series 17: T1 dynamic fat-sat · axial · 3.0mm · 1.19mm/px · z∈[-92,+169]mm · 3 of 88 slices shown (3 of 5)]
[im 1/88]
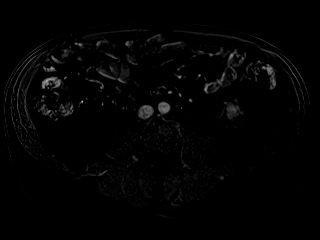
[im 44/88]
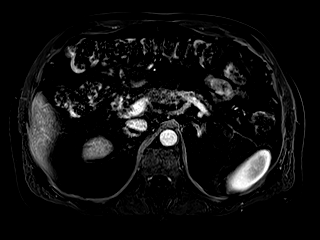
[im 88/88]
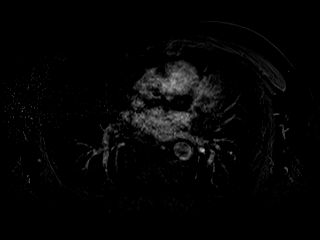

[Series 18: T1 dynamic fat-sat post-contrast · axial · 3.0mm · 1.19mm/px · z∈[-92,+169]mm · 3 of 88 slices shown (3 of 4)]
[im 1/88]
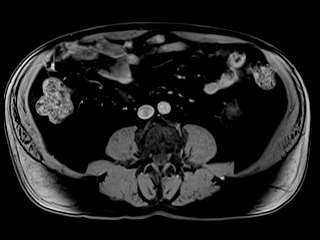
[im 44/88]
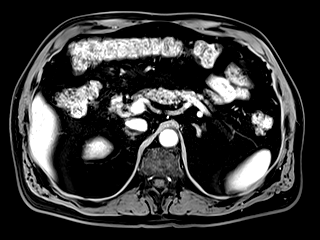
[im 88/88]
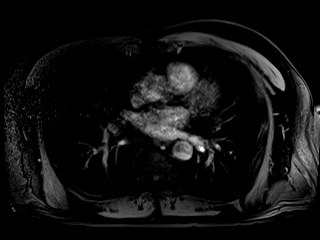

[Series 19: T1 dynamic fat-sat · axial · 3.0mm · 1.19mm/px · z∈[-92,+169]mm · 3 of 88 slices shown (4 of 5)]
[im 1/88]
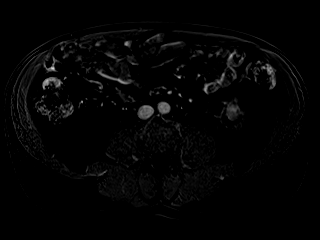
[im 44/88]
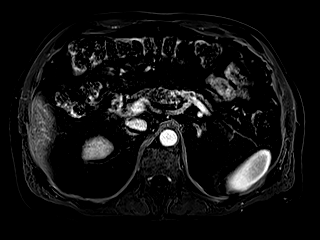
[im 88/88]
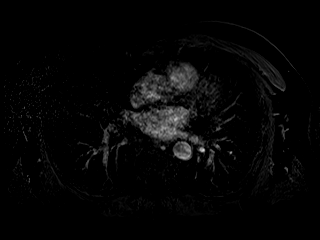

[Series 20: T1 dynamic post-contrast · coronal · 3.0mm · 1.31mm/px · 3 of 72 slices shown]
[im 1/72]
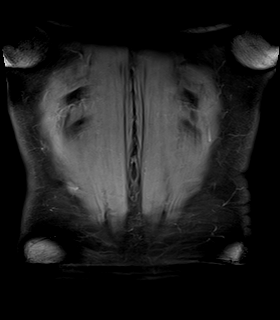
[im 36/72]
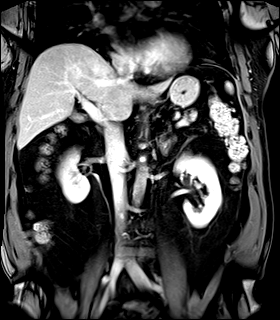
[im 72/72]
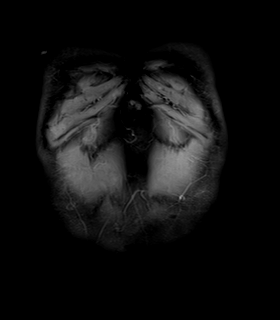

[Series 21: T1 dynamic fat-sat post-contrast · axial · 3.0mm · 1.19mm/px · z∈[-92,+169]mm · 3 of 88 slices shown (4 of 4)]
[im 1/88]
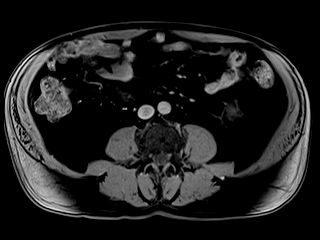
[im 44/88]
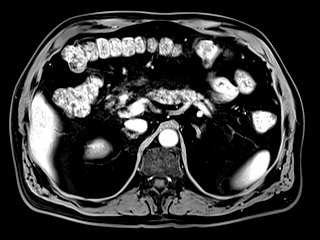
[im 88/88]
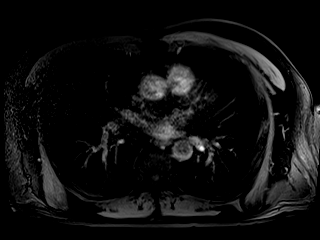

[Series 22: T1 dynamic fat-sat · axial · 3.0mm · 1.19mm/px · z∈[-92,+169]mm · 3 of 88 slices shown (5 of 5)]
[im 1/88]
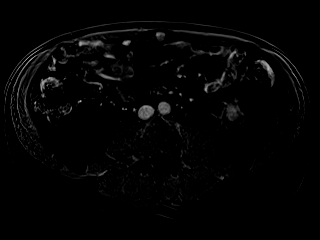
[im 44/88]
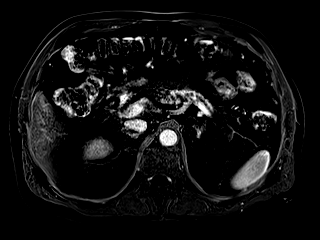
[im 88/88]
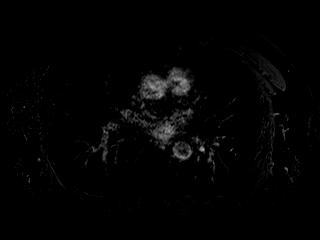

[48 of 48 positions shown; findings below may reference images not displayed]

FINDINGS: Lower chest:  Lung bases are clear.

Hepatobiliary: No focal hepatic lesion. Gallbladder are normal.
Common bile duct normal.

Pancreas: Normal pancreatic parenchymal intensity. No ductal
dilatation or inflammation.

Spleen: Normal spleen.

Adrenals/urinary tract: Adrenal glands are normal.

Partially exophytic from the lower pole of the LEFT kidney is a
round lesion which is mixed signal intensity on T2 weighted imaging
measuring 1.2 x 1.7 cm (image 30/series 5). This lesion is
hypointense on precontrast T1 weighted imaging and demonstrates a
septated enhancement on early postcontrast series (series 14).

No additional renal lesions are present

Stomach/Bowel: Stomach and limited of the small bowel is
unremarkable

Vascular/Lymphatic: Abdominal aortic normal caliber. No
retroperitoneal periportal lymphadenopathy.

Musculoskeletal: No aggressive osseous lesion
IMPRESSION: 1. Enhancing renal lesion partially exophytic from the lower pole of
the LEFT kidney has imaging characteristics consistent with renal
neoplasm. Findings suggestive of clear cell renal cell carcinoma.
2. Normal MRI of the abdomen otherwise.

## 2020-04-27 DIAGNOSIS — E119 Type 2 diabetes mellitus without complications: Secondary | ICD-10-CM | POA: Diagnosis not present

## 2020-04-27 DIAGNOSIS — I700 Atherosclerosis of aorta: Secondary | ICD-10-CM | POA: Diagnosis not present

## 2020-04-27 DIAGNOSIS — I10 Essential (primary) hypertension: Secondary | ICD-10-CM | POA: Diagnosis not present

## 2020-04-27 DIAGNOSIS — R002 Palpitations: Secondary | ICD-10-CM | POA: Diagnosis not present

## 2020-04-27 DIAGNOSIS — E785 Hyperlipidemia, unspecified: Secondary | ICD-10-CM | POA: Diagnosis not present

## 2020-05-10 DIAGNOSIS — G4733 Obstructive sleep apnea (adult) (pediatric): Secondary | ICD-10-CM | POA: Diagnosis not present

## 2020-05-16 ENCOUNTER — Encounter: Payer: Self-pay | Admitting: Urology

## 2020-05-22 ENCOUNTER — Other Ambulatory Visit: Payer: Self-pay | Admitting: Urology

## 2020-06-10 DIAGNOSIS — G4733 Obstructive sleep apnea (adult) (pediatric): Secondary | ICD-10-CM | POA: Diagnosis not present

## 2020-06-13 ENCOUNTER — Ambulatory Visit: Payer: PPO | Admitting: Urology

## 2020-06-20 ENCOUNTER — Ambulatory Visit: Payer: PPO | Admitting: Urology

## 2020-07-03 DIAGNOSIS — E119 Type 2 diabetes mellitus without complications: Secondary | ICD-10-CM | POA: Diagnosis not present

## 2020-07-03 DIAGNOSIS — E785 Hyperlipidemia, unspecified: Secondary | ICD-10-CM | POA: Diagnosis not present

## 2020-07-06 DIAGNOSIS — E119 Type 2 diabetes mellitus without complications: Secondary | ICD-10-CM | POA: Diagnosis not present

## 2020-07-06 DIAGNOSIS — G4733 Obstructive sleep apnea (adult) (pediatric): Secondary | ICD-10-CM | POA: Diagnosis not present

## 2020-07-06 DIAGNOSIS — E785 Hyperlipidemia, unspecified: Secondary | ICD-10-CM | POA: Diagnosis not present

## 2020-07-06 DIAGNOSIS — G8929 Other chronic pain: Secondary | ICD-10-CM | POA: Diagnosis not present

## 2020-07-06 DIAGNOSIS — M5442 Lumbago with sciatica, left side: Secondary | ICD-10-CM | POA: Diagnosis not present

## 2020-07-06 DIAGNOSIS — I700 Atherosclerosis of aorta: Secondary | ICD-10-CM | POA: Diagnosis not present

## 2020-07-06 DIAGNOSIS — I10 Essential (primary) hypertension: Secondary | ICD-10-CM | POA: Diagnosis not present

## 2020-07-10 DIAGNOSIS — G4733 Obstructive sleep apnea (adult) (pediatric): Secondary | ICD-10-CM | POA: Diagnosis not present

## 2020-07-12 ENCOUNTER — Other Ambulatory Visit: Payer: Self-pay

## 2020-07-12 ENCOUNTER — Ambulatory Visit
Admission: RE | Admit: 2020-07-12 | Discharge: 2020-07-12 | Disposition: A | Discharge status: Home / Self Care | Payer: PPO | Source: Ambulatory Visit | Attending: Urology | Admitting: Urology

## 2020-07-12 DIAGNOSIS — D49512 Neoplasm of unspecified behavior of left kidney: Secondary | ICD-10-CM | POA: Diagnosis not present

## 2020-07-12 DIAGNOSIS — N2889 Other specified disorders of kidney and ureter: Secondary | ICD-10-CM | POA: Insufficient documentation

## 2020-07-12 DIAGNOSIS — N281 Cyst of kidney, acquired: Secondary | ICD-10-CM | POA: Diagnosis not present

## 2020-07-18 NOTE — Progress Notes (Addendum)
07/19/2020 12:58 PM   Marcus Huang 1950/10/27 [id number]  Referring provider: Sofie Hartigan, MD [address] [place],  [place] [address] Chief Complaint  Patient presents with  . Other    Urologic history: 1.  Incidental left renal mass  -1.7 x 1.5 x 1.5 cm lesion left inferior pole on CT 06/2019 -MRI 07/2020.2 x 1.7 cm enhancing lesion -Elected surveillance  2.  Peyronie's disease -Palpable plaque, minimal curvature  3.  History nephrolithiasis   HPI: Marcus Huang is a 69 y.o. male who returns for a 1 year follow up of an incidental left renal mass.   -Since last visit doing well and denies flank, abdominal pain -No dysuria or gross hematuria -Noncontrast MRI 07/12/2020 with stable 1.7 x 1.5 x 1.4 cm left lower pole renal mass   -No penile curvature, notes occasional mild pain with achieving an erection -Denies ED  -Since last visit he notes occasional right hemiscrotal pain; he has chronic right back pain and notes the scrotal pain is worse when his back is hurting  PMH: Past Medical History:  Diagnosis Date  . Carotid artery thrombosis    Left Carotid Artery  . Diabetes ([place])   . Diverticulosis   . GERD (gastroesophageal reflux disease)   . History of kidney stones   . Hyperlipidemia   . Hypertension   . Renal mass   . Sleep apnea    C-PAP    Surgical History: Past Surgical History:  Procedure Laterality Date  . COLONOSCOPY WITH PROPOFOL N/A 08/15/2019   Procedure: COLONOSCOPY WITH PROPOFOL;  Surgeon: Lucilla Lame, MD;  Location: [place];  Service: Endoscopy;  Laterality: N/A;  sleep apnea  . EYE SURGERY Bilateral    LASIK EYE SURGERY  . INGUINAL HERNIA REPAIR Bilateral 05/12/2017   Procedure: LAPAROSCOPIC BILATERAL INGUINAL HERNIA REPAIR, umbilical hernia repair;  Surgeon: Florene Glen, MD;  Location: [facility] ORS;  Service: General;  Laterality: Bilateral;  . none    . TONSILLECTOMY      Home Medications:  Allergies as of  07/19/2020   No Known Allergies     Medication List       Accurate as of July 19, 2020 12:58 PM. If you have any questions, ask your nurse or doctor.        aspirin EC 81 MG tablet Take 81 mg by mouth daily at 12 noon.   azelastine 0.1 % nasal spray Commonly known as: ASTELIN Place into the nose. PRN   cloNIDine 0.1 MG tablet Commonly known as: CATAPRES Take by mouth.   diltiazem 120 MG 24 hr capsule Commonly known as: CARDIZEM CD   multivitamin tablet Take 1 tablet by mouth daily.   rosuvastatin 5 MG tablet Commonly known as: CRESTOR Take 1 tablet by mouth daily.   Suprep Bowel Prep Kit 17.5-3.13-1.6 GM/177ML Soln Generic drug: Na Sulfate-K Sulfate-Mg Sulf   VITAMIN D PO Take by mouth daily.       Allergies: No Known Allergies  Family History: Family History  Problem Relation Age of Onset  . Hypertension Father   . Stroke Father   . Coronary artery disease Maternal Grandmother   . Stroke Maternal Grandmother   . Diabetes Mother   . Hypertension Mother   . Stroke Mother   . Stroke Paternal Grandmother   . Prostate cancer Neg Hx   . Bladder Cancer Neg Hx     Social History:  reports that he has never smoked. He has never  used smokeless tobacco. He reports that he does not drink alcohol and does not use drugs.   Physical Exam: BP (!) 199/96   Pulse 76   Ht 5' 8"  (1.727 m)   Wt 175 lb (79.4 kg)   BMI 26.61 kg/m   Constitutional:  Alert and oriented, No acute distress. HEENT: [place] AT, moist mucus membranes.  Trachea midline, no masses. Cardiovascular: No clubbing, cyanosis, or edema. Respiratory: Normal respiratory effort, no increased work of breathing. GU: Testes descended bilaterally without masses or tenderness.  No palpable hernia Skin: No rashes, bruises or suspicious lesions. Neurologic: Grossly intact, no focal deficits, moving all 4 extremities. Psychiatric: Normal mood and affect.  Laboratory Data:  Lab Results  Component Value  Date   CREATININE 0.90 12/01/2019    Pertinent Imaging: CLINICAL DATA:  69 year old male with history of left renal mass. Follow-up study.  EXAM: MRI ABDOMEN WITHOUT CONTRAST  TECHNIQUE: Multiplanar multisequence MR imaging was performed without the administration of intravenous contrast.  COMPARISON:  Abdominal MRI 12/01/2019.  FINDINGS: Lower chest: Unremarkable.  Hepatobiliary: A few scattered subcentimeter T1 hypointense, T2 hyperintense lesions are again noted in the liver, incompletely characterized on today's noncontrast examination, but statistically likely to represent tiny cysts and/or biliary hamartomas. No other larger more suspicious hepatic lesions are noted on today's noncontrast examination. No intra or extrahepatic biliary ductal dilatation. Gallbladder is normal in appearance.  Pancreas: No pancreatic mass. No pancreatic ductal dilatation. No pancreatic or peripancreatic fluid collections or inflammatory changes.  Spleen:  Unremarkable.  Adrenals/Urinary Tract: In the lower pole of the left kidney posteriorly (axial image 31 of series 13 and coronal image 12 of series 2) there is a 1.7 x 1.5 x 1.4 cm T1 hypointense, heterogeneously T2 hyperintense lesion with internal septations, grossly stable in size compared to the prior examination. Right kidney and bilateral adrenal glands are otherwise normal in appearance. No hydroureteronephrosis in the visualized portions of the abdomen.  Stomach/Bowel: Visualized portions are unremarkable.  Vascular/Lymphatic: No aneurysm identified in the visualized abdominal vasculature. No lymphadenopathy noted in the abdomen.  Other: No significant volume of ascites noted in the visualized portions of the peritoneal cavity.  Musculoskeletal: No aggressive appearing osseous lesions are noted in the visualized portions of the skeleton.  IMPRESSION: 1. Small cystic lesion with internal septations in the  lower pole of the left kidney, previously characterized as a cystic neoplasm, grossly stable on today's noncontrast examination. No definite signs of metastatic disease in the abdomen.   Electronically Signed   By: Vinnie Langton M.D.   On: 07/12/2020 08:28  I have personally reviewed the images.     Assessment & Plan:    1. Incidental left renal mass   No significant change  Desires to continue surveillance  Will schedule renal ultrasound 9 months     2. Peyronie's disease  Mild pain in curvature  3.  Right hemiscrotal pain  New problem  No abnormalities on exam  Pain worse with exacerbation of his right low back pain and this most likely is neuropathic in etiology  4. Prostate cancer screening  We discussed AUA prostate cancer screening recommendations of annually between the ages of 51-69  I offered to draw his PSA today however he states he is due to have blood work drawn with his PCP in January and will request it be done at that time   [place] [address] [place], [place] [address] [phone]  I, Selena Batten, am  acting as a scribe for Dr. Nicki Reaper C. Messiah Rovira,  I have reviewed the above documentation for accuracy and completeness, and I agree with the above.    Abbie Sons, MD

## 2020-07-19 ENCOUNTER — Encounter: Payer: Self-pay | Admitting: Urology

## 2020-07-19 ENCOUNTER — Ambulatory Visit: Payer: PPO | Admitting: Urology

## 2020-07-19 ENCOUNTER — Other Ambulatory Visit: Payer: Self-pay

## 2020-07-19 VITALS — BP 199/96 | HR 76 | Ht 68.0 in | Wt 175.0 lb

## 2020-07-19 DIAGNOSIS — N486 Induration penis plastica: Secondary | ICD-10-CM | POA: Diagnosis not present

## 2020-07-19 DIAGNOSIS — N5082 Scrotal pain: Secondary | ICD-10-CM

## 2020-07-19 DIAGNOSIS — N2889 Other specified disorders of kidney and ureter: Secondary | ICD-10-CM | POA: Diagnosis not present

## 2020-08-10 DIAGNOSIS — G4733 Obstructive sleep apnea (adult) (pediatric): Secondary | ICD-10-CM | POA: Diagnosis not present

## 2020-08-16 DIAGNOSIS — M1812 Unilateral primary osteoarthritis of first carpometacarpal joint, left hand: Secondary | ICD-10-CM | POA: Diagnosis not present

## 2020-08-16 DIAGNOSIS — M25532 Pain in left wrist: Secondary | ICD-10-CM | POA: Diagnosis not present

## 2020-08-16 DIAGNOSIS — M654 Radial styloid tenosynovitis [de Quervain]: Secondary | ICD-10-CM | POA: Diagnosis not present

## 2020-09-09 DIAGNOSIS — G4733 Obstructive sleep apnea (adult) (pediatric): Secondary | ICD-10-CM | POA: Diagnosis not present

## 2020-10-08 DIAGNOSIS — E119 Type 2 diabetes mellitus without complications: Secondary | ICD-10-CM | POA: Diagnosis not present

## 2020-10-08 DIAGNOSIS — E785 Hyperlipidemia, unspecified: Secondary | ICD-10-CM | POA: Diagnosis not present

## 2020-10-08 DIAGNOSIS — I160 Hypertensive urgency: Secondary | ICD-10-CM | POA: Diagnosis not present

## 2020-10-08 DIAGNOSIS — Z125 Encounter for screening for malignant neoplasm of prostate: Secondary | ICD-10-CM | POA: Diagnosis not present

## 2020-10-10 DIAGNOSIS — G4733 Obstructive sleep apnea (adult) (pediatric): Secondary | ICD-10-CM | POA: Diagnosis not present

## 2020-10-13 ENCOUNTER — Encounter: Payer: Self-pay | Admitting: Urology

## 2020-10-16 ENCOUNTER — Other Ambulatory Visit: Payer: Self-pay | Admitting: Urology

## 2020-10-16 DIAGNOSIS — N5082 Scrotal pain: Secondary | ICD-10-CM

## 2020-11-02 DIAGNOSIS — I700 Atherosclerosis of aorta: Secondary | ICD-10-CM | POA: Diagnosis not present

## 2020-11-02 DIAGNOSIS — I10 Essential (primary) hypertension: Secondary | ICD-10-CM | POA: Diagnosis not present

## 2020-11-02 DIAGNOSIS — R002 Palpitations: Secondary | ICD-10-CM | POA: Diagnosis not present

## 2020-11-02 DIAGNOSIS — E785 Hyperlipidemia, unspecified: Secondary | ICD-10-CM | POA: Diagnosis not present

## 2020-11-10 DIAGNOSIS — G4733 Obstructive sleep apnea (adult) (pediatric): Secondary | ICD-10-CM | POA: Diagnosis not present

## 2021-01-07 DIAGNOSIS — E119 Type 2 diabetes mellitus without complications: Secondary | ICD-10-CM | POA: Diagnosis not present

## 2021-01-07 DIAGNOSIS — I10 Essential (primary) hypertension: Secondary | ICD-10-CM | POA: Diagnosis not present

## 2021-01-08 DIAGNOSIS — I700 Atherosclerosis of aorta: Secondary | ICD-10-CM | POA: Diagnosis not present

## 2021-01-08 DIAGNOSIS — E1165 Type 2 diabetes mellitus with hyperglycemia: Secondary | ICD-10-CM | POA: Diagnosis not present

## 2021-01-08 DIAGNOSIS — G8929 Other chronic pain: Secondary | ICD-10-CM | POA: Diagnosis not present

## 2021-01-08 DIAGNOSIS — M5442 Lumbago with sciatica, left side: Secondary | ICD-10-CM | POA: Diagnosis not present

## 2021-01-08 DIAGNOSIS — G4733 Obstructive sleep apnea (adult) (pediatric): Secondary | ICD-10-CM | POA: Diagnosis not present

## 2021-01-08 DIAGNOSIS — Z0000 Encounter for general adult medical examination without abnormal findings: Secondary | ICD-10-CM | POA: Diagnosis not present

## 2021-01-08 DIAGNOSIS — E785 Hyperlipidemia, unspecified: Secondary | ICD-10-CM | POA: Diagnosis not present

## 2021-01-08 DIAGNOSIS — I10 Essential (primary) hypertension: Secondary | ICD-10-CM | POA: Diagnosis not present

## 2021-01-08 DIAGNOSIS — Z23 Encounter for immunization: Secondary | ICD-10-CM | POA: Diagnosis not present

## 2021-04-12 ENCOUNTER — Other Ambulatory Visit: Payer: Self-pay

## 2021-04-12 ENCOUNTER — Ambulatory Visit
Admission: RE | Admit: 2021-04-12 | Discharge: 2021-04-12 | Disposition: A | Discharge status: Home / Self Care | Payer: PPO | Source: Ambulatory Visit | Attending: Urology | Admitting: Urology

## 2021-04-12 DIAGNOSIS — N2889 Other specified disorders of kidney and ureter: Secondary | ICD-10-CM

## 2021-04-16 ENCOUNTER — Encounter: Payer: Self-pay | Admitting: Urology

## 2021-04-17 ENCOUNTER — Other Ambulatory Visit: Payer: Self-pay | Admitting: Urology

## 2021-04-17 DIAGNOSIS — N2889 Other specified disorders of kidney and ureter: Secondary | ICD-10-CM

## 2021-04-18 ENCOUNTER — Ambulatory Visit: Payer: Self-pay | Admitting: Urology

## 2021-06-12 DIAGNOSIS — E785 Hyperlipidemia, unspecified: Secondary | ICD-10-CM | POA: Diagnosis not present

## 2021-06-12 DIAGNOSIS — E1165 Type 2 diabetes mellitus with hyperglycemia: Secondary | ICD-10-CM | POA: Diagnosis not present

## 2021-06-19 DIAGNOSIS — G4733 Obstructive sleep apnea (adult) (pediatric): Secondary | ICD-10-CM | POA: Diagnosis not present

## 2021-06-19 DIAGNOSIS — I700 Atherosclerosis of aorta: Secondary | ICD-10-CM | POA: Diagnosis not present

## 2021-06-19 DIAGNOSIS — E785 Hyperlipidemia, unspecified: Secondary | ICD-10-CM | POA: Diagnosis not present

## 2021-06-19 DIAGNOSIS — M5442 Lumbago with sciatica, left side: Secondary | ICD-10-CM | POA: Diagnosis not present

## 2021-06-19 DIAGNOSIS — G8929 Other chronic pain: Secondary | ICD-10-CM | POA: Diagnosis not present

## 2021-06-19 DIAGNOSIS — I10 Essential (primary) hypertension: Secondary | ICD-10-CM | POA: Diagnosis not present

## 2021-06-19 DIAGNOSIS — E1165 Type 2 diabetes mellitus with hyperglycemia: Secondary | ICD-10-CM | POA: Diagnosis not present

## 2021-07-25 ENCOUNTER — Ambulatory Visit
Admission: RE | Admit: 2021-07-25 | Discharge: 2021-07-25 | Disposition: A | Discharge status: Home / Self Care | Payer: PPO | Source: Ambulatory Visit | Attending: Urology | Admitting: Urology

## 2021-07-25 DIAGNOSIS — N2889 Other specified disorders of kidney and ureter: Secondary | ICD-10-CM | POA: Insufficient documentation

## 2021-07-25 DIAGNOSIS — I700 Atherosclerosis of aorta: Secondary | ICD-10-CM | POA: Diagnosis not present

## 2021-07-25 DIAGNOSIS — K314 Gastric diverticulum: Secondary | ICD-10-CM | POA: Diagnosis not present

## 2021-07-25 MED ORDER — GADOBUTROL 1 MMOL/ML IV SOLN
7.0000 mL | Freq: Once | INTRAVENOUS | Status: AC | PRN
  Administered 2021-07-25: 7 mL via INTRAVENOUS

## 2021-07-26 ENCOUNTER — Telehealth: Payer: Self-pay | Admitting: Urology

## 2021-07-26 ENCOUNTER — Encounter: Payer: Self-pay | Admitting: *Deleted

## 2021-07-26 DIAGNOSIS — N2889 Other specified disorders of kidney and ureter: Secondary | ICD-10-CM

## 2021-07-26 NOTE — Telephone Encounter (Signed)
No significant increased change of the left lower pole cystic mass.  Recommend 1 year follow-up office visit with MRI prior.  Order was entered

## 2021-07-29 ENCOUNTER — Other Ambulatory Visit: Payer: Self-pay | Admitting: Urology

## 2021-07-29 DIAGNOSIS — N281 Cyst of kidney, acquired: Secondary | ICD-10-CM

## 2021-11-14 DIAGNOSIS — E1165 Type 2 diabetes mellitus with hyperglycemia: Secondary | ICD-10-CM | POA: Diagnosis not present

## 2021-11-14 DIAGNOSIS — E785 Hyperlipidemia, unspecified: Secondary | ICD-10-CM | POA: Diagnosis not present

## 2021-11-21 DIAGNOSIS — I700 Atherosclerosis of aorta: Secondary | ICD-10-CM | POA: Diagnosis not present

## 2021-11-21 DIAGNOSIS — Z125 Encounter for screening for malignant neoplasm of prostate: Secondary | ICD-10-CM | POA: Diagnosis not present

## 2021-11-21 DIAGNOSIS — G4733 Obstructive sleep apnea (adult) (pediatric): Secondary | ICD-10-CM | POA: Diagnosis not present

## 2021-11-21 DIAGNOSIS — E1165 Type 2 diabetes mellitus with hyperglycemia: Secondary | ICD-10-CM | POA: Diagnosis not present

## 2021-11-21 DIAGNOSIS — G8929 Other chronic pain: Secondary | ICD-10-CM | POA: Diagnosis not present

## 2021-11-21 DIAGNOSIS — I10 Essential (primary) hypertension: Secondary | ICD-10-CM | POA: Diagnosis not present

## 2021-11-21 DIAGNOSIS — E785 Hyperlipidemia, unspecified: Secondary | ICD-10-CM | POA: Diagnosis not present

## 2021-11-21 DIAGNOSIS — M5442 Lumbago with sciatica, left side: Secondary | ICD-10-CM | POA: Diagnosis not present

## 2021-12-27 ENCOUNTER — Other Ambulatory Visit: Payer: Self-pay

## 2021-12-27 ENCOUNTER — Emergency Department
Admission: EM | Admit: 2021-12-27 | Discharge: 2021-12-27 | Disposition: A | Discharge status: Home / Self Care | Payer: PPO | Attending: Orthopedic Surgery | Admitting: Orthopedic Surgery

## 2021-12-27 DIAGNOSIS — S61214A Laceration without foreign body of right ring finger without damage to nail, initial encounter: Secondary | ICD-10-CM | POA: Diagnosis not present

## 2021-12-27 DIAGNOSIS — Z7982 Long term (current) use of aspirin: Secondary | ICD-10-CM | POA: Diagnosis not present

## 2021-12-27 DIAGNOSIS — W312XXA Contact with powered woodworking and forming machines, initial encounter: Secondary | ICD-10-CM | POA: Diagnosis not present

## 2021-12-27 DIAGNOSIS — S6991XA Unspecified injury of right wrist, hand and finger(s), initial encounter: Secondary | ICD-10-CM | POA: Diagnosis present

## 2021-12-27 DIAGNOSIS — Z23 Encounter for immunization: Secondary | ICD-10-CM | POA: Insufficient documentation

## 2021-12-27 MED ORDER — LIDOCAINE HCL (PF) 1 % IJ SOLN
5.0000 mL | Freq: Once | INTRAMUSCULAR | Status: AC
  Administered 2021-12-27: 5 mL via INTRADERMAL
  Filled 2021-12-27: qty 5

## 2021-12-27 MED ORDER — TETANUS-DIPHTH-ACELL PERTUSSIS 5-2.5-18.5 LF-MCG/0.5 IM SUSY
0.5000 mL | PREFILLED_SYRINGE | Freq: Once | INTRAMUSCULAR | Status: AC
  Administered 2021-12-27: 0.5 mL via INTRAMUSCULAR
  Filled 2021-12-27: qty 0.5

## 2021-12-27 NOTE — ED Triage Notes (Signed)
Pt with laceration noted to mid right ring finger from wood. Pt appears in no acute distress, bleeding controlled, cms intact.  ?

## 2021-12-27 NOTE — ED Provider Notes (Addendum)
?[facility] ?Provider Note ? ? ?CSN: [id number] ?Arrival date & time: 12/27/21  2005 ? ?  ? ?History ? ?Chief Complaint  ?Patient presents with  ? Finger Injury  ? ? ?Marcus Huang is a 71 y.o. male.  Presents to the emergency department for evaluation of right ring finger injury.  Patient suffered a laceration to the dorsal aspect of the right ring finger proximal phalanx, he is able to perform full flexion extension.  No pain.  He feels confident that there is no bony injury only soft tissue injury as he is not having any pain to touch along the proximal phalanx.  His tetanus is not up-to-date, will update today.  Patient states laceration occurred from a piece of wood kicking back on a table saw.  Injury occurred just prior to arrival. ? ?HPI ? ?  ? ?Home Medications ?Prior to Admission medications   ?Medication Sig Start Date End Date Taking? Authorizing Provider  ?aspirin EC 81 MG tablet Take 81 mg by mouth daily at 12 noon.     [provider]  ?azelastine (ASTELIN) 0.1 % nasal spray Place into the nose. PRN 08/04/18   [provider]  ?Multiple Vitamin (MULTIVITAMIN) tablet Take 1 tablet by mouth daily.    [provider]  ?rosuvastatin (CRESTOR) 5 MG tablet Take 1 tablet by mouth daily. 03/04/19   [provider]  ?[place] KIT 17.5-3.13-1.6 GM/177ML SOLN  07/07/19   [provider]  ?VITAMIN D PO Take by mouth daily.    [provider]  ?   ? ?Allergies    ?Patient has no known allergies.   ? ?Review of Systems   ?Review of Systems ? ?Physical Exam ?Updated Vital Signs ?BP (!) 180/100 (BP Location: Left Arm)   Pulse 95   Temp 98.6 ?F (37 ?C) (Oral)   Resp 16   Ht 5' 8"  (1.727 m)   Wt 81.6 kg   SpO2 100%   BMI 27.37 kg/m?  ?Physical Exam ?Constitutional:   ?   Appearance: He is well-developed.  ?HENT:  ?   Head: Normocephalic and atraumatic.  ?Eyes:  ?   Conjunctiva/sclera: Conjunctivae normal.   ?Cardiovascular:  ?   Rate and Rhythm: Normal rate.  ?Pulmonary:  ?   Effort: Pulmonary effort is normal. No respiratory distress.  ?Musculoskeletal:     ?   General: Normal range of motion.  ?   Cervical back: Normal range of motion.  ?   Comments: Right ring finger with full active flexion and extension.  2 cm laceration transverse along the proximal phalanx dorsally, no visible palpable foreign body or tendon deficit.  ?Skin: ?   General: Skin is warm.  ?   Findings: No rash.  ?Neurological:  ?   Mental Status: He is alert and oriented to person, place, and time.  ?Psychiatric:     ?   Mood and Affect: Mood normal.     ?   Behavior: Behavior normal.     ?   Thought Content: Thought content normal.     ?   Judgment: Judgment normal.  ? ? ?ED Results / Procedures / Treatments   ?Labs ?(all labs ordered are listed, but only abnormal results are displayed) ?Labs Reviewed - No data to display ? ?EKG ?None ? ?Radiology ?No results found. ? ?Procedures ?Marland Kitchen.Laceration Repair ? ?Date/Time: 12/27/2021 11:07 PM ?Performed by: Duanne Guess, PA-C ?Authorized by: Duanne Guess, PA-C  ? ?  Consent:  ?  Consent obtained:  Verbal ?  Consent given by:  Patient ?Universal protocol:  ?  Patient identity confirmed:  Verbally with patient ?Anesthesia:  ?  Anesthesia method:  Nerve block ?  Block needle gauge:  25 G ?  Block anesthetic:  Lidocaine 1% w/o epi ?  Block injection procedure:  Anatomic landmarks identified and introduced needle ?  Block outcome:  Anesthesia achieved ?Laceration details:  ?  Location:  Finger ?  Finger location:  R ring finger ?  Length (cm):  2 ?  Depth (mm):  3 ?Pre-procedure details:  ?  Preparation:  Patient was prepped and draped in usual sterile fashion ?Treatment:  ?  Area cleansed with:  Saline and povidone-iodine ?  Amount of cleaning:  Standard ?  Irrigation method:  Pressure wash ?Skin repair:  ?  Repair method:  Sutures ?  Suture size:  5-0 ?  Suture material:  Nylon ?  Suture technique:   Simple interrupted ?  Number of sutures:  4 ?Approximation:  ?  Approximation:  Close ?Repair type:  ?  Repair type:  Simple ?Post-procedure details:  ?  Dressing:  Adhesive bandage ?  Procedure completion:  Tolerated well, no immediate complications  ? ? ?Medications Ordered in ED ?Medications  ?lidocaine (PF) (XYLOCAINE) 1 % injection 5 mL (has no administration in time range)  ?Tdap (BOOSTRIX) injection 0.5 mL (0.5 mLs Intramuscular Given 12/27/21 2202)  ? ? ?ED Course/ Medical Decision Making/ A&P ?  ?                        ?Medical Decision Making ?Risk ?Prescription drug management. ? ? ?71 year old male with laceration to the dorsal aspect of the right ring finger proximal phalanx.  No visible or palpable foreign body.  No tendon deficits noted.  No bony tenderness to palpation, patient feels confident that there is no fracture so we elected not to proceed with any x-rays today.  Tetanus is updated.  Laceration was thoroughly irrigated cleansed and repaired with number four 5-0 nylon sutures.  Patient will follow-up in 12 days for suture ?Final Clinical Impression(s) / ED Diagnoses ?Final diagnoses:  ?Laceration of right ring finger without foreign body without damage to nail, initial encounter  ? ? ?Rx / DC Orders ?ED Discharge Orders   ? ? None  ? ?  ? ? ?  ?Duanne Guess, PA-C ?12/27/21 2309 ? ?  ?Duanne Guess, PA-C ?12/27/21 2309 ? ?  ?Blake Divine, MD ?12/31/21 1612 ? ?

## 2021-12-27 NOTE — Discharge Instructions (Signed)
Please keep laceration site clean.  You may shower and get wet but do not submerge underwater.  Cleanse laceration daily with alcohol and keep covered with a Band-Aid during the day.  Follow-up with primary care provider, walk-in clinic and 12 to 14 days for suture removal.  Return for any increasing pain swelling redness or drainage ?

## 2021-12-27 NOTE — ED Notes (Signed)
See triage note. Pt in with lac to finger on R hand from wood that flew back and hit him while he was using a table saw. Dried blood noted at site. Pt can move injured finger appropriately and finger is warm.  ?

## 2022-02-14 DIAGNOSIS — E1165 Type 2 diabetes mellitus with hyperglycemia: Secondary | ICD-10-CM | POA: Diagnosis not present

## 2022-02-14 DIAGNOSIS — Z125 Encounter for screening for malignant neoplasm of prostate: Secondary | ICD-10-CM | POA: Diagnosis not present

## 2022-02-14 DIAGNOSIS — E785 Hyperlipidemia, unspecified: Secondary | ICD-10-CM | POA: Diagnosis not present

## 2022-02-24 DIAGNOSIS — E1165 Type 2 diabetes mellitus with hyperglycemia: Secondary | ICD-10-CM | POA: Diagnosis not present

## 2022-02-24 DIAGNOSIS — G8929 Other chronic pain: Secondary | ICD-10-CM | POA: Diagnosis not present

## 2022-02-24 DIAGNOSIS — M5442 Lumbago with sciatica, left side: Secondary | ICD-10-CM | POA: Diagnosis not present

## 2022-02-24 DIAGNOSIS — I700 Atherosclerosis of aorta: Secondary | ICD-10-CM | POA: Diagnosis not present

## 2022-02-24 DIAGNOSIS — Z0000 Encounter for general adult medical examination without abnormal findings: Secondary | ICD-10-CM | POA: Diagnosis not present

## 2022-02-24 DIAGNOSIS — E785 Hyperlipidemia, unspecified: Secondary | ICD-10-CM | POA: Diagnosis not present

## 2022-02-24 DIAGNOSIS — I10 Essential (primary) hypertension: Secondary | ICD-10-CM | POA: Diagnosis not present

## 2022-02-24 DIAGNOSIS — G4733 Obstructive sleep apnea (adult) (pediatric): Secondary | ICD-10-CM | POA: Diagnosis not present

## 2022-03-31 DIAGNOSIS — E1165 Type 2 diabetes mellitus with hyperglycemia: Secondary | ICD-10-CM | POA: Diagnosis not present

## 2022-03-31 DIAGNOSIS — I700 Atherosclerosis of aorta: Secondary | ICD-10-CM | POA: Diagnosis not present

## 2022-03-31 DIAGNOSIS — I10 Essential (primary) hypertension: Secondary | ICD-10-CM | POA: Diagnosis not present

## 2022-03-31 DIAGNOSIS — G4733 Obstructive sleep apnea (adult) (pediatric): Secondary | ICD-10-CM | POA: Diagnosis not present

## 2022-03-31 DIAGNOSIS — E785 Hyperlipidemia, unspecified: Secondary | ICD-10-CM | POA: Diagnosis not present

## 2022-06-27 DIAGNOSIS — E1165 Type 2 diabetes mellitus with hyperglycemia: Secondary | ICD-10-CM | POA: Diagnosis not present

## 2022-06-27 DIAGNOSIS — E785 Hyperlipidemia, unspecified: Secondary | ICD-10-CM | POA: Diagnosis not present

## 2022-07-03 DIAGNOSIS — E785 Hyperlipidemia, unspecified: Secondary | ICD-10-CM | POA: Diagnosis not present

## 2022-07-03 DIAGNOSIS — E1165 Type 2 diabetes mellitus with hyperglycemia: Secondary | ICD-10-CM | POA: Diagnosis not present

## 2022-07-03 DIAGNOSIS — M5442 Lumbago with sciatica, left side: Secondary | ICD-10-CM | POA: Diagnosis not present

## 2022-07-03 DIAGNOSIS — I700 Atherosclerosis of aorta: Secondary | ICD-10-CM | POA: Diagnosis not present

## 2022-07-03 DIAGNOSIS — G8929 Other chronic pain: Secondary | ICD-10-CM | POA: Diagnosis not present

## 2022-07-03 DIAGNOSIS — G4733 Obstructive sleep apnea (adult) (pediatric): Secondary | ICD-10-CM | POA: Diagnosis not present

## 2022-07-03 DIAGNOSIS — I10 Essential (primary) hypertension: Secondary | ICD-10-CM | POA: Diagnosis not present

## 2022-07-15 ENCOUNTER — Emergency Department: Payer: PPO

## 2022-07-15 ENCOUNTER — Emergency Department
Admission: EM | Admit: 2022-07-15 | Discharge: 2022-07-15 | Disposition: A | Discharge status: Home / Self Care | Payer: PPO | Attending: Emergency Medicine | Admitting: Emergency Medicine

## 2022-07-15 ENCOUNTER — Encounter: Payer: Self-pay | Admitting: Emergency Medicine

## 2022-07-15 ENCOUNTER — Other Ambulatory Visit: Payer: Self-pay

## 2022-07-15 DIAGNOSIS — I10 Essential (primary) hypertension: Secondary | ICD-10-CM | POA: Insufficient documentation

## 2022-07-15 DIAGNOSIS — I6602 Occlusion and stenosis of left middle cerebral artery: Secondary | ICD-10-CM | POA: Insufficient documentation

## 2022-07-15 DIAGNOSIS — I639 Cerebral infarction, unspecified: Secondary | ICD-10-CM | POA: Diagnosis not present

## 2022-07-15 DIAGNOSIS — Z7902 Long term (current) use of antithrombotics/antiplatelets: Secondary | ICD-10-CM | POA: Diagnosis not present

## 2022-07-15 DIAGNOSIS — E119 Type 2 diabetes mellitus without complications: Secondary | ICD-10-CM | POA: Diagnosis not present

## 2022-07-15 DIAGNOSIS — R519 Headache, unspecified: Secondary | ICD-10-CM | POA: Diagnosis present

## 2022-07-15 DIAGNOSIS — Z7982 Long term (current) use of aspirin: Secondary | ICD-10-CM | POA: Insufficient documentation

## 2022-07-15 DIAGNOSIS — R002 Palpitations: Secondary | ICD-10-CM | POA: Diagnosis not present

## 2022-07-15 DIAGNOSIS — I6523 Occlusion and stenosis of bilateral carotid arteries: Secondary | ICD-10-CM | POA: Diagnosis not present

## 2022-07-15 LAB — CBC
HCT: 42.5 % (ref 39.0–52.0)
Hemoglobin: 14.8 g/dL (ref 13.0–17.0)
MCH: 29.9 pg (ref 26.0–34.0)
MCHC: 34.8 g/dL (ref 30.0–36.0)
MCV: 85.9 fL (ref 80.0–100.0)
Platelets: 189 10*3/uL (ref 150–400)
RBC: 4.95 MIL/uL (ref 4.22–5.81)
RDW: 12.7 % (ref 11.5–15.5)
WBC: 5.3 10*3/uL (ref 4.0–10.5)
nRBC: 0 % (ref 0.0–0.2)

## 2022-07-15 LAB — BASIC METABOLIC PANEL
Anion gap: 7 (ref 5–15)
BUN: 24 mg/dL — ABNORMAL HIGH (ref 8–23)
CO2: 29 mmol/L (ref 22–32)
Calcium: 8.9 mg/dL (ref 8.9–10.3)
Chloride: 103 mmol/L (ref 98–111)
Creatinine, Ser: 1.06 mg/dL (ref 0.61–1.24)
GFR, Estimated: >60 mL/min (ref >60)
Glucose, Bld: 122 mg/dL — ABNORMAL HIGH (ref 70–99)
Potassium: 3.3 mmol/L — ABNORMAL LOW (ref 3.5–5.1)
Sodium: 139 mmol/L (ref 135–145)

## 2022-07-15 LAB — TROPONIN I (HIGH SENSITIVITY): Troponin I (High Sensitivity): 9 ng/L (ref <18)

## 2022-07-15 MED ORDER — ASPIRIN EC 81 MG PO TBEC: 81.0000 mg | DELAYED_RELEASE_TABLET | Freq: Every day | ORAL | 2 refills | Status: DC

## 2022-07-15 MED ORDER — ROSUVASTATIN CALCIUM 40 MG PO TABS: 40.0000 mg | ORAL_TABLET | Freq: Every day | ORAL | 1 refills | Status: DC

## 2022-07-15 MED ORDER — IOHEXOL 350 MG/ML SOLN
75.0000 mL | Freq: Once | INTRAVENOUS | Status: AC | PRN
Start: 2022-07-15 — End: 2022-07-15
  Administered 2022-07-15: 75 mL via INTRAVENOUS

## 2022-07-15 MED ORDER — ATORVASTATIN CALCIUM 40 MG PO TABS: 40.0000 mg | ORAL_TABLET | Freq: Every day | ORAL | 11 refills | Status: DC

## 2022-07-15 MED ORDER — CLOPIDOGREL BISULFATE 75 MG PO TABS: 75.0000 mg | ORAL_TABLET | Freq: Every day | ORAL | 11 refills | Status: AC

## 2022-07-15 NOTE — ED Provider Notes (Signed)
[facility] Provider Note    Event Date/Time   First MD Initiated Contact with Patient 07/15/22 [phone]     (approximate)   History   Palpitations   HPI  Marcus Huang is a 71 y.o. male with a history of diabetes, carotid artery thrombosis, hyperlipidemia, hypertension, sleep apnea who presents with complaints of palpitations, and pulsation in the left side of his neck and head.  Patient reports over the years he has had intermittent palpitations when he wakes up, he has worn Holter monitor before which demonstrated PVCs and PACs per review of medical records from August 2021.  He reports what is a new today is that he is having pulsating sensation in the left side of his neck and in his head.  He states that he can hear his blood "whooshing "in his ear.  He reports he is never had this before.  Does have a history of high blood pressure as well.  Complains of mild headache.  Does report a history of a carotid artery thrombosis     Physical Exam   Triage Vital Signs: ED Triage Vitals  Enc Vitals Group     BP 07/15/22 0629 (!) 203/104     Pulse Rate 07/15/22 0629 87     Resp 07/15/22 0629 18     Temp 07/15/22 0629 97.9 F (36.6 C)     Temp Source 07/15/22 0629 Oral     SpO2 07/15/22 0629 97 %     Weight 07/15/22 0630 79.8 kg (176 lb)     Height 07/15/22 0630 1.727 m ('5\' 8"'$ )     Head Circumference --      Peak Flow --      Pain Score --      Pain Loc --      Pain Edu? --      Excl. in [place]? --     Most recent vital signs: Vitals:   07/15/22 0642 07/15/22 0936  BP: (!) 178/110 (!) 170/100  Pulse:  80  Resp:  18  Temp:    SpO2:  98%     General: Awake, no distress.  CV:  Good peripheral perfusion.  Regular rate and rhythm, no new murmurs Resp:  Normal effort.  Abd:  No distention.  Other:  No bruit heard over the left carotid.  Neurologically intact   ED Results / Procedures / Treatments   Labs (all labs ordered are listed, but only  abnormal results are displayed) Labs Reviewed  BASIC METABOLIC PANEL - Abnormal; Notable for the following components:      Result Value   Potassium 3.3 (*)    Glucose, Bld 122 (*)    BUN 24 (*)    All other components within normal limits  CBC  TROPONIN I (HIGH SENSITIVITY)     EKG  ED ECG REPORT I, Lavonia Drafts, the attending physician, personally viewed and interpreted this ECG.  Date: 07/15/2022  Rhythm: normal sinus rhythm QRS Axis: normal Intervals: normal ST/T Wave abnormalities: normal Narrative Interpretation: no evidence of acute ischemia    RADIOLOGY CT angio of the head and neck MRI negative for acute stroke   PROCEDURES:  Critical Care performed:   Procedures   MEDICATIONS ORDERED IN ED: Medications  iohexol (OMNIPAQUE) 350 MG/ML injection 75 mL (75 mLs Intravenous Contrast Given 07/15/22 0758)     IMPRESSION / MDM / ASSESSMENT AND PLAN / ED COURSE  I reviewed the triage vital signs and the nursing  notes. Patient's presentation is most consistent with acute presentation with potential threat to life or bodily function.  Patient presents with reports of palpitations, and headache with pulsation as described above.  Differential includes arrhythmia, PVCs, PACs, V. tach.  EKG is overall quite reassuring, this appears to be in line with what he has had before  However the pulsation is new.  Given his history of carotid artery stenosis, will obtain CT angio of the head and neck  Contacted by radiology and notified of borderline critical stenosis.  Discussed with Dr. Leonel Ramsay of neurology who notes likely need for increased Crestor, dual platelet agents but also recommends getting an MRI to evaluate for possible CVA distally  Discussed MRI results with Dr. Leonel Ramsay of neurology, no acute infarcts, he recommends dual platelets, increasing statin, outpatient follow-up with neurology, discussed this with patient he agrees with this plan.       FINAL CLINICAL IMPRESSION(S) / ED DIAGNOSES   Final diagnoses:  Middle cerebral artery stenosis, left     Rx / DC Orders   ED Discharge Orders          Ordered    aspirin EC 81 MG tablet  Daily        07/15/22 0953    clopidogrel (PLAVIX) 75 MG tablet  Daily        07/15/22 0953    rosuvastatin (CRESTOR) 40 MG tablet  Daily,   Status:  Discontinued        07/15/22 0953    atorvastatin (LIPITOR) 40 MG tablet  Daily        07/15/22 1000             Note:  This document was prepared using Dragon voice recognition software and may include unintentional dictation errors.   Lavonia Drafts, MD 07/15/22 1124

## 2022-07-15 NOTE — ED Triage Notes (Signed)
Patient ambulatory to triage with steady gait, without difficulty or distress noted; pt reports awakening with palpitations, heart racing; st last several days has had sensation of same; denies any accomp symptoms

## 2022-07-18 DIAGNOSIS — R002 Palpitations: Secondary | ICD-10-CM | POA: Diagnosis not present

## 2022-07-18 DIAGNOSIS — I10 Essential (primary) hypertension: Secondary | ICD-10-CM | POA: Diagnosis not present

## 2022-07-18 DIAGNOSIS — E119 Type 2 diabetes mellitus without complications: Secondary | ICD-10-CM | POA: Diagnosis not present

## 2022-07-18 DIAGNOSIS — I700 Atherosclerosis of aorta: Secondary | ICD-10-CM | POA: Diagnosis not present

## 2022-07-18 DIAGNOSIS — I160 Hypertensive urgency: Secondary | ICD-10-CM | POA: Diagnosis not present

## 2022-07-18 DIAGNOSIS — I6522 Occlusion and stenosis of left carotid artery: Secondary | ICD-10-CM | POA: Diagnosis not present

## 2022-07-22 DIAGNOSIS — E785 Hyperlipidemia, unspecified: Secondary | ICD-10-CM | POA: Diagnosis not present

## 2022-07-22 DIAGNOSIS — I10 Essential (primary) hypertension: Secondary | ICD-10-CM | POA: Diagnosis not present

## 2022-07-22 DIAGNOSIS — R002 Palpitations: Secondary | ICD-10-CM | POA: Diagnosis not present

## 2022-07-22 DIAGNOSIS — E119 Type 2 diabetes mellitus without complications: Secondary | ICD-10-CM | POA: Diagnosis not present

## 2022-07-23 ENCOUNTER — Other Ambulatory Visit: Payer: Self-pay | Admitting: Cardiology

## 2022-07-23 DIAGNOSIS — R002 Palpitations: Secondary | ICD-10-CM

## 2022-07-24 ENCOUNTER — Encounter (HOSPITAL_COMMUNITY): Payer: Self-pay

## 2022-07-24 ENCOUNTER — Other Ambulatory Visit (HOSPITAL_COMMUNITY): Payer: Self-pay | Admitting: *Deleted

## 2022-07-24 ENCOUNTER — Telehealth (HOSPITAL_COMMUNITY): Payer: Self-pay | Admitting: *Deleted

## 2022-07-24 MED ORDER — METOPROLOL TARTRATE 100 MG PO TABS: ORAL_TABLET | ORAL | 0 refills | Status: DC

## 2022-07-24 NOTE — Telephone Encounter (Signed)
Attempted to call patient regarding upcoming cardiac CT appointment. °Left message on voicemail with name and callback number ° °Fidel Caggiano RN Navigator Cardiac Imaging °[facility] °[phone] Office °[phone] Cell ° °

## 2022-07-24 NOTE — Telephone Encounter (Signed)
Patient returning call regarding upcoming cardiac imaging study; pt verbalizes understanding of appt date/time, parking situation and where to check in,  medications ordered, and verified current allergies; name and call back number provided for further questions should they arise  Marcus Clement RN Navigator Cardiac Imaging [facility] [phone] office [phone] cell  Patient to take '100mg'$  metoprolol tartrate two hours prior to his cardiac CT scan.

## 2022-07-25 ENCOUNTER — Telehealth: Payer: Self-pay | Admitting: Family Medicine

## 2022-07-25 DIAGNOSIS — N281 Cyst of kidney, acquired: Secondary | ICD-10-CM

## 2022-07-25 DIAGNOSIS — E785 Hyperlipidemia, unspecified: Secondary | ICD-10-CM | POA: Diagnosis not present

## 2022-07-25 DIAGNOSIS — G8929 Other chronic pain: Secondary | ICD-10-CM | POA: Diagnosis not present

## 2022-07-25 DIAGNOSIS — I10 Essential (primary) hypertension: Secondary | ICD-10-CM | POA: Diagnosis not present

## 2022-07-25 DIAGNOSIS — I700 Atherosclerosis of aorta: Secondary | ICD-10-CM | POA: Diagnosis not present

## 2022-07-25 DIAGNOSIS — E1165 Type 2 diabetes mellitus with hyperglycemia: Secondary | ICD-10-CM | POA: Diagnosis not present

## 2022-07-25 NOTE — Telephone Encounter (Signed)
I called patient to cancel his appointment because he has not done the MRI and he called imaging and they cannot get him in before the order expires. He will need a new order.

## 2022-07-28 ENCOUNTER — Ambulatory Visit: Payer: PPO | Admitting: Urology

## 2022-07-28 ENCOUNTER — Other Ambulatory Visit: Payer: Self-pay | Admitting: Cardiology

## 2022-07-28 ENCOUNTER — Ambulatory Visit
Admission: RE | Admit: 2022-07-28 | Discharge: 2022-07-28 | Disposition: A | Discharge status: Home / Self Care | Payer: PPO | Source: Ambulatory Visit | Attending: Cardiology | Admitting: Cardiology

## 2022-07-28 DIAGNOSIS — R002 Palpitations: Secondary | ICD-10-CM | POA: Diagnosis not present

## 2022-07-28 DIAGNOSIS — I700 Atherosclerosis of aorta: Secondary | ICD-10-CM | POA: Insufficient documentation

## 2022-07-28 DIAGNOSIS — I2510 Atherosclerotic heart disease of native coronary artery without angina pectoris: Secondary | ICD-10-CM | POA: Insufficient documentation

## 2022-07-28 DIAGNOSIS — E222 Syndrome of inappropriate secretion of antidiuretic hormone: Secondary | ICD-10-CM | POA: Diagnosis not present

## 2022-07-28 DIAGNOSIS — R931 Abnormal findings on diagnostic imaging of heart and coronary circulation: Secondary | ICD-10-CM | POA: Insufficient documentation

## 2022-07-28 MED ORDER — NITROGLYCERIN 0.4 MG SL SUBL
SUBLINGUAL_TABLET | SUBLINGUAL | Status: AC
  Filled 2022-07-28: qty 1

## 2022-07-28 MED ORDER — NITROGLYCERIN 0.4 MG SL SUBL
0.8000 mg | SUBLINGUAL_TABLET | Freq: Once | SUBLINGUAL | Status: AC
  Administered 2022-07-28: 0.8 mg via SUBLINGUAL

## 2022-07-28 MED ORDER — IOHEXOL 350 MG/ML SOLN
100.0000 mL | Freq: Once | INTRAVENOUS | Status: AC | PRN
  Administered 2022-07-28: 100 mL via INTRAVENOUS

## 2022-07-28 NOTE — Progress Notes (Signed)
Patient tolerated procedure well. Ambulate w/o difficulty. Denies any lightheadedness or being dizzy. Pt denies any pain at this time. Sitting in chair, drinking water provided. P is encouraged to drink additional water throughout the day and reason explained to patient. Patient verbalized understanding and all questions answered. ABC intact. No further needs at this time. Discharge from procedure area w/o issues.  °

## 2022-07-29 ENCOUNTER — Telehealth: Payer: Self-pay

## 2022-07-29 NOTE — Telephone Encounter (Signed)
     Patient  visit on 10/31  at [facility]  Have you been able to follow up with your primary care physician? Yes   The patient was or was not able to obtain any needed medicine or equipment. Yes   Are there diet recommendations that you are having difficulty following? Na   Patient expresses understanding of discharge instructions and education provided has no other needs at this time.   Yes     [place], [facility], Care Management  [phone] [address], [place], [place] [address] Phone: [phone] Email: [email or web address]

## 2022-08-04 ENCOUNTER — Ambulatory Visit (INDEPENDENT_AMBULATORY_CARE_PROVIDER_SITE_OTHER): Payer: PPO | Admitting: Internal Medicine

## 2022-08-04 VITALS — BP 158/85 | HR 62 | Resp 14 | Ht 68.0 in | Wt 175.0 lb

## 2022-08-04 DIAGNOSIS — Z7189 Other specified counseling: Secondary | ICD-10-CM | POA: Diagnosis not present

## 2022-08-04 DIAGNOSIS — G4733 Obstructive sleep apnea (adult) (pediatric): Secondary | ICD-10-CM

## 2022-08-04 NOTE — Patient Instructions (Signed)

## 2022-08-04 NOTE — Progress Notes (Signed)
[facility] [place], [place] [address]  Pulmonary Sleep Medicine   Office Visit Note  Patient Name: Marcus Huang DOB: 10-Mar-1951 MRN [id number]    Chief Complaint: Obstructive Sleep Apnea visit  Brief History:  Marcus Huang is seen today for an initial evaluation for sleep apnea on CPAP at 6 cmh20. The patient has a 7 year history of sleep apnea. Patient is not using PAP nightly.  He has been sleeping in a recliner with a mouth piece.  The patient feels not rested after sleeping with PAP.  The patient reports not benefiting from PAP use. Reported sleepiness is  improved and the Epworth Sleepiness Score is 6 out of 24. The patient does take naps daily for about an hour. The patient complains of the following: feels like the CPAP is not working properly, he stated that "it does not wake him up anymore".  He has had some issue with dryness and mask leak.  The compliance download shows 48% compliance with an average use time of 3 hours 16 minutes. The AHI is 6.4.  The patient does not complain of limb movements disrupting sleep. He is undergoing cardiac evaluation for coronary artery disease and palpitations.    ROS  General: (-) fever, (-) chills, (-) night sweat Nose and Sinuses: (-) nasal stuffiness or itchiness, (-) postnasal drip, (-) nosebleeds, (-) sinus trouble. Mouth and Throat: (-) sore throat, (-) hoarseness. Neck: (-) swollen glands, (-) enlarged thyroid, (-) neck pain. Respiratory: - cough, - shortness of breath, - wheezing. Neurologic: - numbness, - tingling. Psychiatric: - anxiety, - depression   Current Medication: Outpatient Encounter Medications as of 08/04/2022  Medication Sig   amLODipine (NORVASC) 2.5 MG tablet Take by mouth.   losartan-hydrochlorothiazide (HYZAAR) 50-12.5 MG tablet Take 1 tablet by mouth 2 (two) times daily.   pioglitazone (ACTOS) 30 MG tablet Take by mouth.   pravastatin (PRAVACHOL) 40 MG tablet Take 1 tablet by mouth at  bedtime.   PREVIDENT 5000 SENSITIVE 1.1-5 % GEL Place onto teeth as directed.   traMADol (ULTRAM) 50 MG tablet TAKE 1 TABLET(50 MG) BY MOUTH EVERY 6 HOURS AS NEEDED FOR PAIN   aspirin EC 81 MG tablet Take 1 tablet (81 mg total) by mouth daily at 12 noon.   azelastine (ASTELIN) 0.1 % nasal spray Place into the nose. PRN   clopidogrel (PLAVIX) 75 MG tablet Take 1 tablet (75 mg total) by mouth daily.   Multiple Vitamin (MULTIVITAMIN) tablet Take 1 tablet by mouth daily.   VITAMIN D PO Take by mouth daily.   [DISCONTINUED] metoprolol tartrate (LOPRESSOR) 100 MG tablet Take tablet (147m) TWO hours prior to your cardiac CT scan.   [DISCONTINUED] SUPREP BOWEL PREP KIT 17.5-3.13-1.6 GM/177ML SOLN    No facility-administered encounter medications on file as of 08/04/2022.    Surgical History: Past Surgical History:  Procedure Laterality Date   COLONOSCOPY WITH PROPOFOL N/A 08/15/2019   Procedure: COLONOSCOPY WITH PROPOFOL;  Surgeon: WLucilla Lame MD;  [facility]  Service: Endoscopy;  Laterality: N/A;  sleep apnea   EYE SURGERY Bilateral    LASIK EYE SURGERY   INGUINAL HERNIA REPAIR Bilateral 05/12/2017   Procedure: LAPAROSCOPIC BILATERAL INGUINAL HERNIA REPAIR, umbilical hernia repair;  Surgeon: CFlorene Glen MD;  Location: [facility];  Service: General;  Laterality: Bilateral;   none     TONSILLECTOMY      Medical History: Past Medical History:  Diagnosis Date   Carotid artery thrombosis  Left Carotid Artery   Diabetes (HCC)    Diverticulosis    GERD (gastroesophageal reflux disease)    History of kidney stones    Hyperlipidemia    Hypertension    Renal mass    Sleep apnea    C-PAP    Family History: Non contributory to the present illness  Social History: Social History   Socioeconomic History   Marital status: Married    Spouse name: Not on file   Number of children: Not on file   Years of education: Not on file   Highest education level: Not  on file  Occupational History   Not on file  Tobacco Use   Smoking status: Never   Smokeless tobacco: Never  Vaping Use   Vaping Use: Never used  Substance and Sexual Activity   Alcohol use: No   Drug use: No   Sexual activity: Yes    Birth control/protection: None  Other Topics Concern   Not on file  Social History Narrative   Not on file   Social Determinants of Health   Financial Resource Strain: Not on file  Food Insecurity: Not on file  Transportation Needs: Not on file  Physical Activity: Not on file  Stress: Not on file  Social Connections: Not on file  Intimate Partner Violence: Not on file    Vital Signs: Blood pressure (!) 158/85, pulse 62, resp. rate 14, height _0  (1.727 m), weight 175 lb (79.4 kg), SpO2 97 %. Body mass index is 26.61 kg/m.    Examination: General Appearance: The patient is well-developed, well-nourished, and in no distress. Neck Circumference: 40 cm Skin: Gross inspection of skin unremarkable. Head: normocephalic, no gross deformities. Eyes: no gross deformities noted. ENT: ears appear grossly normal Neurologic: Alert and oriented. No involuntary movements.  STOP BANG RISK ASSESSMENT S (snore) Have you been told that you snore?     NO   T (tired) Are you often tired, fatigued, or sleepy during the day?   YES  O (obstruction) Do you stop breathing, choke, or gasp during sleep? NO   P (pressure) Do you have or are you being treated for high blood pressure? YES   B (BMI) Is your body index greater than 35 kg/m? NO   A (age) Are you 30 years old or older? YES   N (neck) Do you have a neck circumference greater than 16 inches?   YES   G (gender) Are you a male? YES   TOTAL STOP/BANG "YES" ANSWERS 5       A STOP-Bang score of 2 or less is considered low risk, and a score of 5 or more is high risk for having either moderate or severe OSA. For people who score 3 or 4, doctors may need to perform further assessment to determine  how likely they are to have OSA.         EPWORTH SLEEPINESS SCALE:  Scale:  (0)= no chance of dozing; (1)= slight chance of dozing; (2)= moderate chance of dozing; (3)= high chance of dozing  Chance  Situtation    Sitting and reading: 1    Watching TV: 0    Sitting Inactive in public: 0    As a passenger in car: 1      Lying down to rest: 3    Sitting and talking: 0    Sitting quielty after lunch: 1    In a car, stopped in traffic: 0   TOTAL SCORE:  6 out of 24    SLEEP STUDIES:  HST (06/01/15)  AHI 20.2, min SPO2 67%   CPAP COMPLIANCE DATA:  Date Range: 07/30/21 - 07/29/22  Average Daily Use: 3 hours 16 minutes  Median Use: 5 hours 21 minutes  Compliance for > 4 Hours: 177 days  AHI: 6.4 respiratory events per hour  Days Used: 253/365  Mask Leak: 5.0  95th Percentile Pressure: 6 cmh20         LABS: Recent Results (from the past 2160 hour(s))  CBC     Status: None   Collection Time: 07/15/22  6:37 AM  Result Value Ref Range   WBC 5.3 4.0 - 10.5 K/uL   RBC 4.95 4.22 - 5.81 MIL/uL   Hemoglobin 14.8 13.0 - 17.0 g/dL   HCT 42.5 39.0 - 52.0 %   MCV 85.9 80.0 - 100.0 fL   MCH 29.9 26.0 - 34.0 pg   MCHC 34.8 30.0 - 36.0 g/dL   RDW 12.7 11.5 - 15.5 %   Platelets 189 150 - 400 K/uL   nRBC 0.0 0.0 - 0.2 %    Comment: Performed at [facility], [address] [place], [place] [address]  Basic metabolic panel     Status: Abnormal   Collection Time: 07/15/22  6:37 AM  Result Value Ref Range   Sodium 139 135 - 145 mmol/L   Potassium 3.3 (L) 3.5 - 5.1 mmol/L   Chloride 103 98 - 111 mmol/L   CO2 29 22 - 32 mmol/L   Glucose, Bld 122 (H) 70 - 99 mg/dL    Comment: Glucose reference range applies only to samples taken after fasting for at least 8 hours.   BUN 24 (H) 8 - 23 mg/dL   Creatinine, Ser 1.06 0.61 - 1.24 mg/dL   Calcium 8.9 8.9 - 10.3 mg/dL   GFR, Estimated >60 >60 mL/min    Comment: (NOTE) Calculated using the CKD-EPI  Creatinine Equation (2021)    Anion gap 7 5 - 15    Comment: Performed at [facility], [place], [place] [address]  Troponin I (High Sensitivity)     Status: None   Collection Time: 07/15/22  6:37 AM  Result Value Ref Range   Troponin I (High Sensitivity) 9 <18 ng/L    Comment: (NOTE) Elevated high sensitivity troponin I (hsTnI) values and significant  changes across serial measurements may suggest ACS but many other  chronic and acute conditions are known to elevate hsTnI results.  Refer to the "Links" section for chest pain algorithms and additional  guidance. Performed at [facility], [address] [place], [place] [address]     Radiology: CT CORONARY FFR DATA PREP & FLUID ANALYSIS  Result Date: 07/29/2022 EXAM: CT FFR ANALYSIS CLINICAL DATA:  Abnormal CCTA FINDINGS: FFRct analysis was performed on the original cardiac CT angiogram dataset. Diagrammatic representation of the FFRct analysis is provided in a separate PDF document in PACS. This dictation was created using the PDF document and an interactive 3D model of the results. 3D model is not available in the EMR/PACS. Normal FFR range is >0.80. 1. Left Main:  No significant stenosis. 2. LAD: significant stenosis in the distal LAD.  FFRct 0.64 3. LCX: significant stenosis in the proximal LCx.  FFRct 0.73 4. RCA: significant stenosis in the distal RCA/proximal PDA. FFRct 0.68 IMPRESSION: 1. CT FFR analysis showed significant stenosis in the distal LAD, RCA and proximal LCx. 2.  Cardiac catheterization recommended.  Electronically Signed   By: Kate Sable M.D.   On: 07/29/2022 08:26   CT CORONARY MORPH W/CTA COR W/SCORE W/CA W/CM &/OR WO/CM  Addendum Date: 07/28/2022   ADDENDUM REPORT: 07/28/2022 16:31 EXAM: OVER-READ INTERPRETATION  CT CHEST The following report is an over-read performed by radiologist Dr. Nettie Elm [facility] on 07/28/2022. This over-read does not  include interpretation of cardiac or coronary anatomy or pathology. The coronary calcium score/coronary CTA interpretation by the cardiologist is attached. COMPARISON:  None. FINDINGS: Vascular: There are no significant non-cardiac vascular findings. Mild atherosclerotic calcification of the descending thoracic aorta. Mediastinum/Nodes: There are no enlarged lymph nodes.The visualized esophagus demonstrates no significant findings. Lungs/Pleura: Clear lungs. No pneumothorax or pleural effusion. Upper abdomen: No acute abnormality. Musculoskeletal/Chest wall: No chest wall abnormality. No acute or significant osseous findings. IMPRESSION: 1. No significant extracardiac findings. 2.  Aortic Atherosclerosis (ICD10-I70.0). Electronically Signed   By: Titus Dubin M.D.   On: 07/28/2022 16:31   Result Date: 07/28/2022 CLINICAL DATA:  Chest pain EXAM: Cardiac/Coronary  CTA TECHNIQUE: The patient was scanned on a Siemens Somatom go.Top scanner. : A prospective scan was triggered in the ascending thoracic aorta. Axial non-contrast 3 mm slices were carried out through the heart. The data set was analyzed on a dedicated work station and scored using the [place]. Gantry rotation speed was 330 msecs and collimation was .6 mm. 171m of metoprolol and 0.8 mg of sl NTG was given. The 3D data set was reconstructed in 5% intervals of the 60-95 % of the R-R cycle. Diastolic phases were analyzed on a dedicated work station using MPR, MIP and VRT modes. The patient received 100 cc of contrast. FINDINGS: Aorta: Normal size. Mild descending aorta calcifications. No dissection. Aortic Valve:  Trileaflet.  No calcifications. Coronary Arteries:  Normal coronary origin.  Right dominance. RCA is a dominant artery that gives rise to PDA and PLA. There is non calcified plaque in the mid segment causing severe stenosis (>70%). Left main gives rise to LAD and LCX arteries. LAD has non calcified plaque distally causing severe stenosis  (>75%). There is non calcified plaque proximally causing mild stenosis (25-49%). LCX is a non-dominant artery that gives rise to two branches. There is calcified and noncalcified plaque proximally causing mild stenosis (25-49%). Other findings: Normal pulmonary vein drainage into the left atrium. Normal left atrial appendage without a thrombus. Normal size of the pulmonary artery. IMPRESSION: 1. Coronary calcium score of 218. This was 59th percentile for age and sex matched control. 2. Normal coronary origin with right dominance. 3. Severe stenosis in the mid RCA and distal LAD (>70%). 4. Mild stenosis in the proximal LAD and LCx (25-49%). 5. CAD-RADS 4 Severe stenosis. (70-99% or > 50% left main). Cardiac catheterization is recommended. Consider symptom-guided anti-ischemic pharmacotherapy as well as risk factor modification per guideline directed care. 6. Additional analysis with CT FFR will be submitted and reported separately. Electronically Signed: By: BKate SableM.D. On: 07/28/2022 15:39    No results found.  CT CORONARY FFR DATA PREP & FLUID ANALYSIS  Result Date: 07/29/2022 EXAM: CT FFR ANALYSIS CLINICAL DATA:  Abnormal CCTA FINDINGS: FFRct analysis was performed on the original cardiac CT angiogram dataset. Diagrammatic representation of the FFRct analysis is provided in a separate PDF document in PACS. This dictation was created using the PDF document and an interactive 3D model of the results. 3D model is not available in the EMR/PACS. Normal FFR range is >0.80. 1. Left Main:  No significant stenosis. 2. LAD: significant stenosis in the distal LAD.  FFRct 0.64 3. LCX: significant stenosis in the proximal LCx.  FFRct 0.73 4. RCA: significant stenosis in the distal RCA/proximal PDA. FFRct 0.68 IMPRESSION: 1. CT FFR analysis showed significant stenosis in the distal LAD, RCA and proximal LCx. 2.  Cardiac catheterization recommended. Electronically Signed   By: Kate Sable M.D.   On:  07/29/2022 08:26   CT CORONARY MORPH W/CTA COR W/SCORE W/CA W/CM &/OR WO/CM  Addendum Date: 07/28/2022   ADDENDUM REPORT: 07/28/2022 16:31 EXAM: OVER-READ INTERPRETATION  CT CHEST The following report is an over-read performed by radiologist Dr. Nettie Elm [facility] on 07/28/2022. This over-read does not include interpretation of cardiac or coronary anatomy or pathology. The coronary calcium score/coronary CTA interpretation by the cardiologist is attached. COMPARISON:  None. FINDINGS: Vascular: There are no significant non-cardiac vascular findings. Mild atherosclerotic calcification of the descending thoracic aorta. Mediastinum/Nodes: There are no enlarged lymph nodes.The visualized esophagus demonstrates no significant findings. Lungs/Pleura: Clear lungs. No pneumothorax or pleural effusion. Upper abdomen: No acute abnormality. Musculoskeletal/Chest wall: No chest wall abnormality. No acute or significant osseous findings. IMPRESSION: 1. No significant extracardiac findings. 2.  Aortic Atherosclerosis (ICD10-I70.0). Electronically Signed   By: Titus Dubin M.D.   On: 07/28/2022 16:31   Result Date: 07/28/2022 CLINICAL DATA:  Chest pain EXAM: Cardiac/Coronary  CTA TECHNIQUE: The patient was scanned on a Siemens Somatom go.Top scanner. : A prospective scan was triggered in the ascending thoracic aorta. Axial non-contrast 3 mm slices were carried out through the heart. The data set was analyzed on a dedicated work station and scored using the [place]. Gantry rotation speed was 330 msecs and collimation was .6 mm. 142m of metoprolol and 0.8 mg of sl NTG was given. The 3D data set was reconstructed in 5% intervals of the 60-95 % of the R-R cycle. Diastolic phases were analyzed on a dedicated work station using MPR, MIP and VRT modes. The patient received 100 cc of contrast. FINDINGS: Aorta: Normal size. Mild descending aorta calcifications. No dissection. Aortic Valve:   Trileaflet.  No calcifications. Coronary Arteries:  Normal coronary origin.  Right dominance. RCA is a dominant artery that gives rise to PDA and PLA. There is non calcified plaque in the mid segment causing severe stenosis (>70%). Left main gives rise to LAD and LCX arteries. LAD has non calcified plaque distally causing severe stenosis (>75%). There is non calcified plaque proximally causing mild stenosis (25-49%). LCX is a non-dominant artery that gives rise to two branches. There is calcified and noncalcified plaque proximally causing mild stenosis (25-49%). Other findings: Normal pulmonary vein drainage into the left atrium. Normal left atrial appendage without a thrombus. Normal size of the pulmonary artery. IMPRESSION: 1. Coronary calcium score of 218. This was 59th percentile for age and sex matched control. 2. Normal coronary origin with right dominance. 3. Severe stenosis in the mid RCA and distal LAD (>70%). 4. Mild stenosis in the proximal LAD and LCx (25-49%). 5. CAD-RADS 4 Severe stenosis. (70-99% or > 50% left main). Cardiac catheterization is recommended. Consider symptom-guided anti-ischemic pharmacotherapy as well as risk factor modification per guideline directed care. 6. Additional analysis with CT FFR will be submitted and reported separately. Electronically Signed: By: BKate SableM.D. On: 07/28/2022 15:39   MR BRAIN WO CONTRAST  Addendum Date: 07/15/2022   ADDENDUM REPORT: 07/15/2022 09:49 ADDENDUM: On further review, the DWI surrounding the right perforator infarct does not have  definite ADC correlate. Therefore this might relate to artifact (T2 shine through) or subacute infarct. This was discussed with Dr. Leonel Ramsay via telephone at the time of addendum. Electronically Signed   By: Margaretha Sheffield M.D.   On: 07/15/2022 09:49   Result Date: 07/15/2022 CLINICAL DATA:  Carotid artery stenosis evaluate for cva given m1 stenosis EXAM: MRI HEAD WITHOUT CONTRAST TECHNIQUE:  Multiplanar, multiecho pulse sequences of the brain and surrounding structures were obtained without intravenous contrast. COMPARISON:  CTA head from the same day. 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 Mild surrounding restricted diffusion is suggestive of superimposed acute or subacute peri-infarct ischemia. Additional remote infarct in the left basal ganglia. No mass lesion, acute hemorrhage or hydrocephalus. No extra-axial fluid collection. Vascular: Major arterial flow voids are maintained at the skull base. Skull and upper cervical spine: Normal marrow signal. Sinuses/Orbits: Posterior left ethmoid air cell opacification left maxillary sinus retention cyst. No acute orbital findings. Other: No mastoid effusions. 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 Mild surrounding restricted diffusion is suggestive of superimposed acute or subacute peri-infarct ischemia. Electronically Signed: By: Margaretha Sheffield M.D. On: 07/15/2022 09:35   CT ANGIO HEAD NECK W WO CM  Result Date: 07/15/2022 CLINICAL DATA:  Dizziness, persistent/recurrent, cardiac or vascular cause suspected EXAM: CT ANGIOGRAPHY HEAD AND NECK TECHNIQUE: Multidetector CT imaging of the head and neck was performed using the standard protocol during bolus administration of intravenous contrast. Multiplanar CT image reconstructions and MIPs were obtained to evaluate the vascular anatomy. Carotid stenosis measurements (when applicable) are obtained utilizing NASCET criteria, using the distal internal carotid diameter as the denominator. RADIATION DOSE REDUCTION: This exam was performed according to the departmental dose-optimization program which includes automated exposure control, adjustment of the mA and/or kV according to patient size and/or use of iterative reconstruction technique. CONTRAST:  6m OMNIPAQUE IOHEXOL 350 MG/ML SOLN COMPARISON:  CTA Head 05/30/2019. FINDINGS: CT HEAD FINDINGS  Brain: Remote appearing bilateral basal ganglia infarcts, new since 2020. no evidence of acute large vascular territory infarct, acute hemorrhage, mass lesion or midline shift. Vascular: No hyperdense vessel identified. Skull: No acute fracture. Sinuses/Orbits: Opacified left posterior ethmoid air cells, otherwise, clear sinuses. No acute orbital findings. Other: No mastoid effusions. Review of the MIP images confirms the above findings CTA NECK FINDINGS Aortic arch: Great vessel origins are patent without significant stenosis. Right carotid system: No evidence of dissection, stenosis (50% or greater), or occlusion. Left carotid system: No evidence of dissection, stenosis (50% or greater), or occlusion. Atherosclerosis without greater than 50% stenosis. Vertebral arteries: Codominant. No evidence of dissection, stenosis (50% or greater), or occlusion. Skeleton: No acute findings.  Multilevel degenerative change Other neck: No acute findings. Upper chest: Visualized lung apices are clear. Review of the MIP images confirms the above findings CTA HEAD FINDINGS Anterior circulation: Bilateral intracranial ICAs are patent with mild-to-moderate bilateral paraclinoid ICA stenosis. Age indeterminate critical stenosis (nearly occlusive) of the left M1 MCA with asymmetrically diminished/thready more distal left MCA vessel opacification. Posterior circulation: Bilateral intradural vertebral arteries and basilar artery are patent. 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 Right MCA and bilateral ACAs are patent. Hypoplastic right A1 ACA, likely congenital. Moderate right P2 PCA. Venous sinuses: As permitted by contrast timing, patent. Anatomic variants: Detailed above. Review of the MIP images confirms the above findings IMPRESSION: CT head: 1. No evidence of acute intracranial abnormality. An MRI could provide more sensitive evaluation for acute infarct. 2. Remote  appearing bilateral basal ganglia infarcts, new  since 2020. CTA: 1. Age indeterminate critical stenosis (nearly occlusive) of the left M1 MCA with asymmetrically diminished/thready more distal left MCA vessel opacification. 2. Moderate to severe distal basilar artery. 3. Moderate right P2 PCA stenosis. 4. Mild-to-moderate bilateral paraclinoid ICA stenosis. Findings discussed with Dr. Corky Downs via telephone at 8:21 AM. Electronically Signed   By: Margaretha Sheffield M.D.   On: 07/15/2022 08:24      Assessment and Plan: Patient Active Problem List   Diagnosis Date Noted   Special screening for malignant neoplasms, colon    Peyronie's disease 07/16/2019   Renal mass 07/10/2019   Heart palpitations 06/27/2019   Carotid artery stenosis 06/07/3006   Umbilical hernia without obstruction and without gangrene 06/02/2019   Aortic atherosclerosis ([place]) 06/02/2019   Hypertensive urgency 05/31/2019   Non-recurrent bilateral inguinal hernia without obstruction or gangrene    Diabetes type 2, uncontrolled 02/14/2014   Hyperlipidemia, unspecified 02/14/2014   Malignant hypertension 02/14/2014   Type 2 diabetes mellitus without complication, without long-term current use of insulin ([place]) 02/14/2014   Hyperlipidemia 02/14/2014   1. OSA on CPAP The patient does tolerate PAP and reports some  benefit from PAP use. He is having poorly controlled apnea recently- his AHI is elevated to 6.4 over the last year, and patient reports that he does not feel the same as he had in the past on treatment. He finds himself awakening with palpitations. I have recommended cpap titration. The patient was reminded how to clean equipment and advised to replace supplies routinely. The patient was also counselled on weight loss. The compliance is poor. The AHI is 6.4.   OSA on cpap- not controlled. Recommend cpap titration. F/u 30 d after setup.    2. CPAP use counseling CPAP Counseling: had a lengthy discussion with the patient regarding the importance of PAP therapy in  management of the sleep apnea. Patient appears to understand the risk factor reduction and also understands the risks associated with untreated sleep apnea. Patient will try to make a good faith effort to remain compliant with therapy. Also instructed the patient on proper cleaning of the device including the water must be changed daily if possible and use of distilled water is preferred. Patient understands that the machine should be regularly cleaned with appropriate recommended cleaning solutions that do not damage the PAP machine for example given white vinegar and water rinses. Other methods such as ozone treatment may not be as good as these simple methods to achieve cleaning.     .   General Counseling: I have discussed the findings of the evaluation and examination with Marcus Huang.  I have also discussed any further diagnostic evaluation thatmay be needed or ordered today. Marcus Huang verbalizes understanding of the findings of todays visit. We also reviewed his medications today and discussed drug interactions and side effects including but not limited excessive drowsiness and altered mental states. We also discussed that there is always a risk not just to him but also people around him. he has been encouraged to call the office with any questions or concerns that should arise related to todays visit.  No orders of the defined types were placed in this encounter.       I have personally obtained a history, examined the patient, evaluated laboratory and imaging results, formulated the assessment and plan and placed orders. This patient was seen today by Tressie Ellis, PA-C in collaboration with Dr. Devona Konig.   Allyne Gee,  MD [facility] Diplomate ABMS Pulmonary Critical Care Medicine and Sleep Medicine

## 2022-08-05 ENCOUNTER — Encounter: Payer: Self-pay | Admitting: Cardiology

## 2022-08-05 ENCOUNTER — Other Ambulatory Visit: Payer: Self-pay

## 2022-08-05 ENCOUNTER — Observation Stay
Admission: RE | Admit: 2022-08-05 | Discharge: 2022-08-06 | Disposition: A | Discharge status: Home / Self Care | Payer: PPO | Attending: Cardiology | Admitting: Cardiology

## 2022-08-05 ENCOUNTER — Encounter
Admission: RE | Disposition: A | Discharge status: Home / Self Care | Payer: Self-pay | Source: Home / Self Care | Attending: Cardiology

## 2022-08-05 DIAGNOSIS — I2510 Atherosclerotic heart disease of native coronary artery without angina pectoris: Principal | ICD-10-CM | POA: Insufficient documentation

## 2022-08-05 DIAGNOSIS — Z7982 Long term (current) use of aspirin: Secondary | ICD-10-CM | POA: Diagnosis not present

## 2022-08-05 DIAGNOSIS — I25119 Atherosclerotic heart disease of native coronary artery with unspecified angina pectoris: Secondary | ICD-10-CM | POA: Diagnosis not present

## 2022-08-05 DIAGNOSIS — Z01818 Encounter for other preprocedural examination: Secondary | ICD-10-CM

## 2022-08-05 DIAGNOSIS — Z79899 Other long term (current) drug therapy: Secondary | ICD-10-CM | POA: Diagnosis not present

## 2022-08-05 DIAGNOSIS — E119 Type 2 diabetes mellitus without complications: Secondary | ICD-10-CM | POA: Diagnosis not present

## 2022-08-05 DIAGNOSIS — I10 Essential (primary) hypertension: Secondary | ICD-10-CM | POA: Insufficient documentation

## 2022-08-05 DIAGNOSIS — R002 Palpitations: Secondary | ICD-10-CM | POA: Diagnosis present

## 2022-08-05 DIAGNOSIS — Z7902 Long term (current) use of antithrombotics/antiplatelets: Secondary | ICD-10-CM | POA: Diagnosis not present

## 2022-08-05 DIAGNOSIS — R9389 Abnormal findings on diagnostic imaging of other specified body structures: Secondary | ICD-10-CM

## 2022-08-05 HISTORY — PX: CORONARY STENT INTERVENTION: CATH118234

## 2022-08-05 HISTORY — PX: LEFT HEART CATH AND CORONARY ANGIOGRAPHY: CATH118249

## 2022-08-05 LAB — POCT ACTIVATED CLOTTING TIME
Activated Clotting Time: 0 seconds
Activated Clotting Time: 444 seconds
Activated Clotting Time: 347 seconds

## 2022-08-05 LAB — GLUCOSE, CAPILLARY: Glucose-Capillary: 120 mg/dL — ABNORMAL HIGH (ref 70–99)

## 2022-08-05 SURGERY — LEFT HEART CATH AND CORONARY ANGIOGRAPHY
Anesthesia: Moderate Sedation

## 2022-08-05 MED ORDER — HEPARIN SODIUM (PORCINE) 1000 UNIT/ML IJ SOLN
INTRAMUSCULAR | Status: AC
  Filled 2022-08-05: qty 10

## 2022-08-05 MED ORDER — HEPARIN (PORCINE) IN NACL 1000-0.9 UT/500ML-% IV SOLN
INTRAVENOUS | Status: DC | PRN
  Administered 2022-08-05 (×3): 500 mL

## 2022-08-05 MED ORDER — SODIUM CHLORIDE 0.9 % IV SOLN: 250.0000 mL | INTRAVENOUS | Status: DC | PRN

## 2022-08-05 MED ORDER — METOPROLOL TARTRATE 5 MG/5ML IV SOLN
INTRAVENOUS | Status: DC | PRN
  Administered 2022-08-05 (×3): 2.5 mg via INTRAVENOUS

## 2022-08-05 MED ORDER — VERAPAMIL HCL 2.5 MG/ML IV SOLN
INTRAVENOUS | Status: AC
  Filled 2022-08-05: qty 2

## 2022-08-05 MED ORDER — CLOPIDOGREL BISULFATE 75 MG PO TABS
ORAL_TABLET | ORAL | Status: DC | PRN
  Administered 2022-08-05: 600 mg via ORAL

## 2022-08-05 MED ORDER — HEPARIN SODIUM (PORCINE) 1000 UNIT/ML IJ SOLN
INTRAMUSCULAR | Status: DC | PRN
  Administered 2022-08-05: 4000 [IU] via INTRAVENOUS
  Administered 2022-08-05: 8000 [IU] via INTRAVENOUS

## 2022-08-05 MED ORDER — ATORVASTATIN CALCIUM 80 MG PO TABS: 80.0000 mg | ORAL_TABLET | Freq: Every day | ORAL | Status: DC

## 2022-08-05 MED ORDER — HYDROCHLOROTHIAZIDE 12.5 MG PO TABS: 12.5000 mg | ORAL_TABLET | Freq: Every day | ORAL | Status: DC

## 2022-08-05 MED ORDER — SODIUM CHLORIDE 0.9% FLUSH
3.0000 mL | Freq: Two times a day (BID) | INTRAVENOUS | Status: DC
  Administered 2022-08-05: 3 mL via INTRAVENOUS

## 2022-08-05 MED ORDER — LABETALOL HCL 5 MG/ML IV SOLN: 10.0000 mg | INTRAVENOUS | Status: AC | PRN

## 2022-08-05 MED ORDER — METOPROLOL TARTRATE 5 MG/5ML IV SOLN
INTRAVENOUS | Status: AC
  Filled 2022-08-05: qty 5

## 2022-08-05 MED ORDER — MIDAZOLAM HCL 2 MG/2ML IJ SOLN
INTRAMUSCULAR | Status: DC | PRN
  Administered 2022-08-05 (×2): 1 mg via INTRAVENOUS

## 2022-08-05 MED ORDER — FENTANYL CITRATE (PF) 100 MCG/2ML IJ SOLN
INTRAMUSCULAR | Status: DC | PRN
  Administered 2022-08-05 (×2): 25 ug via INTRAVENOUS

## 2022-08-05 MED ORDER — PIOGLITAZONE HCL 30 MG PO TABS
30.0000 mg | ORAL_TABLET | Freq: Every day | ORAL | Status: DC
  Administered 2022-08-05: 30 mg via ORAL
  Filled 2022-08-05 (×2): qty 1

## 2022-08-05 MED ORDER — METOPROLOL TARTRATE 50 MG PO TABS
25.0000 mg | ORAL_TABLET | Freq: Two times a day (BID) | ORAL | Status: DC
  Administered 2022-08-05: 25 mg via ORAL
  Filled 2022-08-05 (×2): qty 1

## 2022-08-05 MED ORDER — ONDANSETRON HCL 4 MG/2ML IJ SOLN: 4.0000 mg | Freq: Four times a day (QID) | INTRAMUSCULAR | Status: DC | PRN

## 2022-08-05 MED ORDER — SODIUM CHLORIDE 0.9% FLUSH: 3.0000 mL | INTRAVENOUS | Status: DC | PRN

## 2022-08-05 MED ORDER — ASPIRIN 81 MG PO CHEW: 81.0000 mg | CHEWABLE_TABLET | ORAL | Status: DC

## 2022-08-05 MED ORDER — HYDROCHLOROTHIAZIDE 25 MG PO TABS
25.0000 mg | ORAL_TABLET | Freq: Every day | ORAL | Status: DC
  Administered 2022-08-05: 25 mg via ORAL
  Filled 2022-08-05: qty 1

## 2022-08-05 MED ORDER — SODIUM CHLORIDE 0.9 % WEIGHT BASED INFUSION
1.0000 mL/kg/h | INTRAVENOUS | Status: AC
  Administered 2022-08-05: 1 mL/kg/h via INTRAVENOUS

## 2022-08-05 MED ORDER — SODIUM CHLORIDE 0.9 % WEIGHT BASED INFUSION
3.0000 mL/kg/h | INTRAVENOUS | Status: AC
  Administered 2022-08-05: 3 mL/kg/h via INTRAVENOUS

## 2022-08-05 MED ORDER — ASPIRIN 81 MG PO CHEW
81.0000 mg | CHEWABLE_TABLET | Freq: Every day | ORAL | Status: DC
  Administered 2022-08-06: 81 mg via ORAL
  Filled 2022-08-05: qty 1

## 2022-08-05 MED ORDER — NITROGLYCERIN 1 MG/10 ML FOR IR/CATH LAB
INTRA_ARTERIAL | Status: DC | PRN
  Administered 2022-08-05: 200 ug via INTRACORONARY

## 2022-08-05 MED ORDER — SIMVASTATIN 40 MG PO TABS: 40.0000 mg | ORAL_TABLET | Freq: Every day | ORAL | Status: DC

## 2022-08-05 MED ORDER — FENTANYL CITRATE (PF) 100 MCG/2ML IJ SOLN
INTRAMUSCULAR | Status: AC
  Filled 2022-08-05: qty 2

## 2022-08-05 MED ORDER — IOHEXOL 300 MG/ML  SOLN
INTRAMUSCULAR | Status: DC | PRN
  Administered 2022-08-05: 272 mL

## 2022-08-05 MED ORDER — CLOPIDOGREL BISULFATE 75 MG PO TABS
75.0000 mg | ORAL_TABLET | Freq: Every day | ORAL | Status: DC
  Administered 2022-08-06: 75 mg via ORAL
  Filled 2022-08-05: qty 1

## 2022-08-05 MED ORDER — HYDRALAZINE HCL 20 MG/ML IJ SOLN: 10.0000 mg | INTRAMUSCULAR | Status: AC | PRN

## 2022-08-05 MED ORDER — SODIUM CHLORIDE 0.9% FLUSH
3.0000 mL | Freq: Two times a day (BID) | INTRAVENOUS | Status: DC
  Administered 2022-08-05 (×2): 3 mL via INTRAVENOUS

## 2022-08-05 MED ORDER — ASPIRIN 81 MG PO CHEW
CHEWABLE_TABLET | ORAL | Status: AC
  Filled 2022-08-05: qty 3

## 2022-08-05 MED ORDER — ACETAMINOPHEN 325 MG PO TABS: 650.0000 mg | ORAL_TABLET | ORAL | Status: DC | PRN

## 2022-08-05 MED ORDER — MIDAZOLAM HCL 2 MG/2ML IJ SOLN
INTRAMUSCULAR | Status: AC
  Filled 2022-08-05: qty 2

## 2022-08-05 MED ORDER — ATORVASTATIN CALCIUM 80 MG PO TABS
80.0000 mg | ORAL_TABLET | Freq: Every day | ORAL | Status: DC
  Administered 2022-08-05: 80 mg via ORAL
  Filled 2022-08-05: qty 1

## 2022-08-05 MED ORDER — AMLODIPINE BESYLATE 5 MG PO TABS: 5.0000 mg | ORAL_TABLET | Freq: Every day | ORAL | Status: DC

## 2022-08-05 MED ORDER — CLOPIDOGREL BISULFATE 75 MG PO TABS
ORAL_TABLET | ORAL | Status: AC
  Filled 2022-08-05: qty 8

## 2022-08-05 MED ORDER — VERAPAMIL HCL 2.5 MG/ML IV SOLN
INTRAVENOUS | Status: DC | PRN
  Administered 2022-08-05 (×2): 2.5 mg via INTRA_ARTERIAL

## 2022-08-05 MED ORDER — ASPIRIN 81 MG PO CHEW
CHEWABLE_TABLET | ORAL | Status: DC | PRN
  Administered 2022-08-05: 243 mg via ORAL

## 2022-08-05 MED ORDER — AMLODIPINE BESYLATE 5 MG PO TABS
2.5000 mg | ORAL_TABLET | Freq: Two times a day (BID) | ORAL | Status: DC
  Administered 2022-08-05 – 2022-08-06 (×2): 2.5 mg via ORAL
  Filled 2022-08-05 (×2): qty 1

## 2022-08-05 MED ORDER — HEPARIN (PORCINE) IN NACL 1000-0.9 UT/500ML-% IV SOLN
INTRAVENOUS | Status: AC
  Filled 2022-08-05: qty 1000

## 2022-08-05 MED ORDER — LOSARTAN POTASSIUM 50 MG PO TABS: 50.0000 mg | ORAL_TABLET | Freq: Every day | ORAL | Status: DC

## 2022-08-05 MED ORDER — SODIUM CHLORIDE 0.9 % WEIGHT BASED INFUSION
1.0000 mL/kg/h | INTRAVENOUS | Status: DC
  Administered 2022-08-05: 1 mL/kg/h via INTRAVENOUS

## 2022-08-05 SURGICAL SUPPLY — 26 items
BALLN TREK RX 2.25X15 (BALLOONS) ×1
BALLN ~~LOC~~ TREK NEO RX 2.5X12 (BALLOONS) ×1
BALLN ~~LOC~~ TREK NEO RX 3.0X15 (BALLOONS) ×1
BALLOON TREK RX 2.25X15 (BALLOONS) IMPLANT
BALLOON ~~LOC~~ TREK NEO RX 2.5X12 (BALLOONS) IMPLANT
BALLOON ~~LOC~~ TREK NEO RX 3.0X15 (BALLOONS) IMPLANT
CATH 5FR JL3.5 JR4 ANG PIG MP (CATHETERS) IMPLANT
CATH VISTA GUIDE 6FR JR4 (CATHETERS) IMPLANT
CATH VISTA GUIDE 6FR JR4 SH (CATHETERS) IMPLANT
DEVICE RAD TR BAND REGULAR (VASCULAR PRODUCTS) IMPLANT
DRAPE BRACHIAL (DRAPES) IMPLANT
GLIDESHEATH SLEND SS 6F .021 (SHEATH) IMPLANT
GUIDEWIRE INQWIRE 1.5J.035X260 (WIRE) IMPLANT
INQWIRE 1.5J .035X260CM (WIRE) ×1
KIT ENCORE 26 ADVANTAGE (KITS) IMPLANT
KIT SYRINGE INJ CVI SPIKEX1 (MISCELLANEOUS) IMPLANT
PACK CARDIAC CATH (CUSTOM PROCEDURE TRAY) ×1 IMPLANT
PROTECTION STATION PRESSURIZED (MISCELLANEOUS) ×1
SET ATX SIMPLICITY (MISCELLANEOUS) IMPLANT
STATION PROTECTION PRESSURIZED (MISCELLANEOUS) IMPLANT
STENT ONYX FRONTIER 2.25X15 (Permanent Stent) IMPLANT
STENT ONYX FRONTIER 2.75X26 (Permanent Stent) IMPLANT
TUBING CIL FLEX 10 FLL-RA (TUBING) IMPLANT
WIRE ASAHI PROWATER 180CM (WIRE) IMPLANT
WIRE HITORQ VERSACORE ST 145CM (WIRE) IMPLANT
WIRE RUNTHROUGH .014X180CM (WIRE) IMPLANT

## 2022-08-05 NOTE — Progress Notes (Signed)
Pt reported left eye blurry vision, no other deficits detected, (and palpitations - but no arrhythmia's noted) MD and team messaged, at 1130 pt reports blurriness resolving --- team messaged  Fam at bedside

## 2022-08-05 NOTE — Progress Notes (Signed)
Pt arrived to room 255 via bed from specials post-cath. Received report from [place], Therapist, sports. Daughter, Dorian Pod, at bedside. VSS. Will continue to monitor.

## 2022-08-05 NOTE — Progress Notes (Signed)
Fam at bedside, pt eating and drinking

## 2022-08-05 NOTE — Progress Notes (Signed)
Pt c/o "feeling like my heart is flipping". States he had this before "and that's when all of the tests on my heart started". Pt on telemetry NSR HR in 80s. BP 173/79. No PRNs available. Isaias Cowman, MD aware.

## 2022-08-06 ENCOUNTER — Encounter: Payer: Self-pay | Admitting: Cardiology

## 2022-08-06 DIAGNOSIS — I2510 Atherosclerotic heart disease of native coronary artery without angina pectoris: Secondary | ICD-10-CM | POA: Diagnosis not present

## 2022-08-06 LAB — CBC
HCT: 38 % — ABNORMAL LOW (ref 39.0–52.0)
Hemoglobin: 13.4 g/dL (ref 13.0–17.0)
MCH: 30.2 pg (ref 26.0–34.0)
MCHC: 35.3 g/dL (ref 30.0–36.0)
MCV: 85.6 fL (ref 80.0–100.0)
Platelets: 198 10*3/uL (ref 150–400)
RBC: 4.44 MIL/uL (ref 4.22–5.81)
RDW: 12.9 % (ref 11.5–15.5)
WBC: 7.3 10*3/uL (ref 4.0–10.5)
nRBC: 0 % (ref 0.0–0.2)

## 2022-08-06 LAB — BASIC METABOLIC PANEL
Anion gap: 7 (ref 5–15)
BUN: 20 mg/dL (ref 8–23)
CO2: 28 mmol/L (ref 22–32)
Calcium: 9 mg/dL (ref 8.9–10.3)
Chloride: 102 mmol/L (ref 98–111)
Creatinine, Ser: 0.94 mg/dL (ref 0.61–1.24)
GFR, Estimated: >60 mL/min (ref >60)
Glucose, Bld: 119 mg/dL — ABNORMAL HIGH (ref 70–99)
Potassium: 3.3 mmol/L — ABNORMAL LOW (ref 3.5–5.1)
Sodium: 137 mmol/L (ref 135–145)

## 2022-08-06 MED ORDER — ATORVASTATIN CALCIUM 80 MG PO TABS: 80.0000 mg | ORAL_TABLET | Freq: Every day | ORAL | 11 refills | Status: DC

## 2022-08-06 MED ORDER — METOPROLOL TARTRATE 25 MG PO TABS: 12.5000 mg | ORAL_TABLET | Freq: Two times a day (BID) | ORAL | 11 refills | Status: DC

## 2022-08-06 MED ORDER — METOPROLOL TARTRATE 25 MG PO TABS
12.5000 mg | ORAL_TABLET | Freq: Two times a day (BID) | ORAL | Status: DC
  Administered 2022-08-06: 12.5 mg via ORAL
  Filled 2022-08-06: qty 1

## 2022-08-06 MED ORDER — HYDROCHLOROTHIAZIDE 25 MG PO TABS: 25.0000 mg | ORAL_TABLET | Freq: Every day | ORAL | 11 refills | Status: DC

## 2022-08-06 MED ORDER — AMLODIPINE BESYLATE 2.5 MG PO TABS: 2.5000 mg | ORAL_TABLET | Freq: Two times a day (BID) | ORAL | 11 refills | Status: DC

## 2022-08-06 NOTE — Discharge Summary (Signed)
Physician Discharge Summary      Patient ID: Marcus Huang MRN: [id number] DOB/AGE: August 30, 1951 71 y.o.  Admit date: 08/05/2022 Discharge date: 08/06/2022  Primary Discharge Diagnosis coronary artery disease Secondary Discharge Diagnosis same  Significant Diagnostic Studies: yes  Consults: None  Hospital Course: 71 year old gentleman with recent cardiac CTA revealing multivessel disease who presented for scheduled cardiac catheterization on 08/05/2022, which revealed two-vessel coronary artery disease with 90% stenosis proximal and distal RCA and 95% stenosis distal LAD with normal left ventricular function. He underwent successful DES x 2 to proximal and distal RCA. Aggressive risk factor modification recommended, as well as possible stage PCI of distal LAD stenosis in near future. The patient experienced palpitations a few hours after procedure, but nurse reported telemetry showed NSR without arrhythmia. At this time, the patient reports feeling well, though generally weak. He is eager to be discharged. He denies chest pain, palpitations, or shortness of breath at this time.    Discharge Exam: Blood pressure 132/69, pulse 72, temperature 97.6 F (36.4 C), temperature source Oral, resp. rate 18, height _0  (1.727 m), weight 77.1 kg, SpO2 97 %.   General appearance: alert, cooperative, appears stated age, and no distress Head: Normocephalic, without obvious abnormality, atraumatic Eyes: negative Resp: clear to auscultation bilaterally Cardio: regular rate and rhythm, S1, S2 normal, no murmur, click, rub or gallop Extremities: extremities normal, atraumatic, no cyanosis or edema Pulses: right radial 2+ Skin: Skin color, texture, turgor normal. No rashes or lesions Neurologic: Grossly normal Incision/Wound: no oozing, edema, or erythema at right radial incision site Labs:   Lab Results  Component Value Date   WBC 7.3 08/06/2022   HGB 13.4 08/06/2022   HCT 38.0 (L)  08/06/2022   MCV 85.6 08/06/2022   PLT 198 08/06/2022    Recent Labs  Lab 08/06/22 0510  NA 137  K 3.3*  CL 102  CO2 28  BUN 20  CREATININE 0.94  CALCIUM 9.0  GLUCOSE 119*      Radiology: none EKG: normal sinus rhythm  FOLLOW UP PLANS AND APPOINTMENTS Discharge Instructions     AMB Referral to Cardiac Rehabilitation - Phase II   Complete by: As directed    Diagnosis: Coronary Stents   After initial evaluation and assessments completed: Virtual Based Care may be provided alone or in conjunction with Phase 2 Cardiac Rehab based on patient barriers.: Yes   Intensive Cardiac Rehabilitation (ICR) [place] location only OR Traditional Cardiac Rehabilitation (TCR) *If criteria for ICR are not met will enroll in TCR [facility] only): Yes      Allergies as of 08/06/2022       Reactions   Metformin Other (See Comments)   Stomach problems   Rosuvastatin Other (See Comments)   fatigue        Medication List     STOP taking these medications    losartan-hydrochlorothiazide 50-12.5 MG tablet Commonly known as: HYZAAR   pravastatin 40 MG tablet Commonly known as: PRAVACHOL       TAKE these medications    amLODipine 2.5 MG tablet Commonly known as: NORVASC Take 1 tablet (2.5 mg total) by mouth in the morning and at bedtime. What changed: how much to take   aspirin EC 81 MG tablet Take 1 tablet (81 mg total) by mouth daily at 12 noon.   atorvastatin 80 MG tablet Commonly known as: LIPITOR Take 1 tablet (80 mg total) by mouth at bedtime.   azelastine 0.1 % nasal Huang Commonly  known as: ASTELIN Place into the nose. PRN   clopidogrel 75 MG tablet Commonly known as: Plavix Take 1 tablet (75 mg total) by mouth daily.   hydrochlorothiazide 25 MG tablet Commonly known as: HYDRODIURIL Take 1 tablet (25 mg total) by mouth daily.   metoprolol tartrate 25 MG tablet Commonly known as: LOPRESSOR Take 0.5 tablets (12.5 mg total) by mouth 2 (two) times daily.    multivitamin tablet Take 1 tablet by mouth daily.   pioglitazone 30 MG tablet Commonly known as: ACTOS Take 30 mg by mouth daily.   PreviDent 5000 Sensitive 1.1-5 % Gel Generic drug: Sod Fluoride-Potassium Nitrate Place onto teeth as directed.   traMADol 50 MG tablet Commonly known as: ULTRAM TAKE 1 TABLET(50 MG) BY MOUTH EVERY 6 HOURS AS NEEDED FOR PAIN   VITAMIN D PO Take by mouth daily.        Follow-up Information     Marcus Spray, MD. Go in 1 week(s).   Specialty: Cardiology Why: Appointment on Wednesday, 08/20/2022 at 1:30pm. Contact information: Marcus Huang [id number] [phone]                 BRING ALL MEDICATIONS WITH YOU TO FOLLOW UP APPOINTMENTS  Time spent with patient to include physician time: 30 minutes Signed:  Clabe Seal PA-C 08/06/2022, 9:02 AM

## 2022-08-07 LAB — LIPOPROTEIN A (LPA): Lipoprotein (a): 42.8 nmol/L — ABNORMAL HIGH (ref <75.0)

## 2022-08-15 DIAGNOSIS — E785 Hyperlipidemia, unspecified: Secondary | ICD-10-CM | POA: Diagnosis not present

## 2022-08-15 DIAGNOSIS — R002 Palpitations: Secondary | ICD-10-CM | POA: Diagnosis not present

## 2022-08-15 DIAGNOSIS — E7800 Pure hypercholesterolemia, unspecified: Secondary | ICD-10-CM | POA: Diagnosis not present

## 2022-08-15 DIAGNOSIS — E119 Type 2 diabetes mellitus without complications: Secondary | ICD-10-CM | POA: Diagnosis not present

## 2022-08-15 DIAGNOSIS — I700 Atherosclerosis of aorta: Secondary | ICD-10-CM | POA: Diagnosis not present

## 2022-08-15 DIAGNOSIS — I10 Essential (primary) hypertension: Secondary | ICD-10-CM | POA: Diagnosis not present

## 2022-08-15 DIAGNOSIS — Z955 Presence of coronary angioplasty implant and graft: Secondary | ICD-10-CM | POA: Diagnosis not present

## 2022-08-15 DIAGNOSIS — Z79899 Other long term (current) drug therapy: Secondary | ICD-10-CM | POA: Diagnosis not present

## 2022-08-19 ENCOUNTER — Ambulatory Visit: Payer: PPO

## 2022-08-20 NOTE — Progress Notes (Signed)
MRN : [id number]  Marcus Huang is a 71 y.o. (May 28, 1951) male who presents with chief complaint of check circulation.  History of Present Illness:  Patient is seen for evaluation of leg pain and leg swelling. The patient first noticed the swelling remotely. The swelling is associated with pain and discoloration. The pain and swelling worsens with prolonged dependency and improves with elevation. The pain is variable and sometimes seems related to activity.  The patient notes that in the morning the legs are significantly improved but they steadily worsened throughout the course of the day. The patient also notes a steady worsening of the discoloration in the ankle and shin area.   The patient describes some claudication symptoms.  The patient denies symptoms consistent with rest pain.  The patient denies and extensive history of DJD and LS spine disease.  The patient has no had any past angiography, interventions or vascular surgery.  Elevation makes the leg symptoms better, dependency makes them much worse. There is no history of ulcerations. The patient denies any recent changes in medications.  The patient has not been wearing graduated compression on a daily basis but has used compression in the past.  The patient denies a history of DVT or PE. There is no prior history of phlebitis. There is no history of primary lymphedema.  No history of malignancies. No history of trauma or groin or pelvic surgery. There is no history of radiation treatment to the groin or pelvis  The patient is also seen for the evaluation of Raynaud's changes to the fingers and toes. The patient notes the fingers and toes turned pale and not usually blue and then become red and that is when the pins and needle sensation occurs.  Exposure to cold environments makes the symptoms much worse. The changes have been going on for years and seemed to be much worse lately. There is no history of trauma or  repetitive injury. The patient does note some peeling of the skin of the fingers in the palms and the skin of the toes on the soles.  The patient has been taking Norvasc to treat hypertension with little benefit.  The patient is also noting problems with hypertension which has been very difficult to control. The patient has a long history of hypertension which recently has become increasingly difficult to control utilizing medical therapy.   The patient does have family history of hypertension.   There is no prior documented abdominal bruit. The patient occasionally has flushing symptoms but denies palpitations. No episodes of syncope.There is no history of headache. There is no history of flash pulmonary edema.  The patient denies a history of renal disease.  The patient does have some documented carotid stenosis but denies amaurosis fugax or recent TIA symptoms. There are no recent neurological changes noted.  No recent episodes of angina or shortness of breath documented.   There is no history of malignancy or autoimmune disease.   No outpatient medications have been marked as taking for the 08/21/22 encounter (Appointment) with Delana Meyer, Dolores Lory, MD.    Past Medical History:  Diagnosis Date   Carotid artery thrombosis    Left Carotid Artery   Diabetes ([place])    Diverticulosis    GERD (gastroesophageal reflux disease)    History of kidney stones    Hyperlipidemia    Hypertension    Renal mass    Sleep apnea  C-PAP    Past Surgical History:  Procedure Laterality Date   COLONOSCOPY WITH PROPOFOL N/A 08/15/2019   Procedure: COLONOSCOPY WITH PROPOFOL;  Surgeon: Lucilla Lame, MD;  Location: [facility];  Service: Endoscopy;  Laterality: N/A;  sleep apnea   CORONARY STENT INTERVENTION N/A 08/05/2022   Procedure: CORONARY STENT INTERVENTION;  Surgeon: Isaias Cowman, MD;  Location: [place] [facility];  Service: Cardiovascular;  Laterality: N/A;   EYE SURGERY  Bilateral    LASIK EYE SURGERY   INGUINAL HERNIA REPAIR Bilateral 05/12/2017   Procedure: LAPAROSCOPIC BILATERAL INGUINAL HERNIA REPAIR, umbilical hernia repair;  Surgeon: Florene Glen, MD;  Location: [facility];  Service: General;  Laterality: Bilateral;   LEFT HEART CATH AND CORONARY ANGIOGRAPHY N/A 08/05/2022   Procedure: LEFT HEART CATH AND CORONARY ANGIOGRAPHY;  Surgeon: Isaias Cowman, MD;  Location: [place] [facility];  Service: Cardiovascular;  Laterality: N/A;   none     TONSILLECTOMY      Social History Social History   Tobacco Use   Smoking status: Never   Smokeless tobacco: Never  Vaping Use   Vaping Use: Never used  Substance Use Topics   Alcohol use: No   Drug use: No    Family History Family History  Problem Relation Age of Onset   Hypertension Father    Stroke Father    Coronary artery disease Maternal Grandmother    Stroke Maternal Grandmother    Diabetes Mother    Hypertension Mother    Stroke Mother    Stroke Paternal Grandmother    Prostate cancer Neg Hx    Bladder Cancer Neg Hx     Allergies  Allergen Reactions   Metformin Other (See Comments)    Stomach problems   Rosuvastatin Other (See Comments)    fatigue     REVIEW OF SYSTEMS (Negative unless checked)  Constitutional: '[]'$ Weight loss  '[]'$ Fever  '[]'$ Chills Cardiac: '[]'$ Chest pain   '[]'$ Chest pressure   '[]'$ Palpitations   '[]'$ Shortness of breath when laying flat   '[]'$ Shortness of breath with exertion. Vascular:  '[x]'$ Pain in legs with walking   '[]'$ Pain in legs at rest  '[]'$ History of DVT   '[]'$ Phlebitis   '[x]'$ Swelling in legs   '[]'$ Varicose veins   '[]'$ Non-healing ulcers Pulmonary:   '[]'$ Uses home oxygen   '[]'$ Productive cough   '[]'$ Hemoptysis   '[]'$ Wheeze  '[]'$ COPD   '[]'$ Asthma Neurologic:  '[]'$ Dizziness   '[]'$ Seizures   '[]'$ History of stroke   '[]'$ History of TIA  '[]'$ Aphasia   '[]'$ Vissual changes   '[]'$ Weakness or numbness in arm   '[]'$ Weakness or numbness in leg Musculoskeletal:   '[]'$ Joint swelling   '[]'$ Joint pain   '[]'$ Low back  pain Hematologic:  '[]'$ Easy bruising  '[]'$ Easy bleeding   '[]'$ Hypercoagulable state   '[]'$ Anemic Gastrointestinal:  '[]'$ Diarrhea   '[]'$ Vomiting  '[]'$ Gastroesophageal reflux/heartburn   '[]'$ Difficulty swallowing. Genitourinary:  '[]'$ Chronic kidney disease   '[]'$ Difficult urination  '[]'$ Frequent urination   '[]'$ Blood in urine Skin:  '[]'$ Rashes   '[]'$ Ulcers  Psychological:  '[]'$ History of anxiety   '[]'$  History of major depression.  Physical Examination  There were no vitals filed for this visit. There is no height or weight on file to calculate BMI. Gen: WD/WN, NAD Head: [place]/AT, No temporalis wasting.  Ear/Nose/Throat: Hearing grossly intact, nares w/o erythema or drainage Eyes: PER, EOMI, sclera nonicteric.  Neck: Supple, no masses.  No bruit or JVD.  Pulmonary:  Good air movement, no audible wheezing, no use of accessory muscles.  Cardiac: RRR, normal S1, S2, no Murmurs. Vascular:  carotid bruit noted, no Raynaud's changes today; 2-3+ soft edema bilateral ankles with mild venous changes no ulcers Vessel Right Left  Radial Palpable Palpable  PT Not Palpable Not Palpable  DP Not Palpable Not Palpable  Gastrointestinal: soft, non-distended. No guarding/no peritoneal signs.  Musculoskeletal: M/S 5/5 throughout.  No visible deformity.  Neurologic: CN 2-12 intact. Pain and light touch intact in extremities.  Symmetrical.  Speech is fluent. Motor exam as listed above. Psychiatric: Judgment intact, Mood & affect appropriate for pt's clinical situation. Dermatologic: No rashes or ulcers noted.  No changes consistent with cellulitis.   CBC Lab Results  Component Value Date   WBC 7.3 08/06/2022   HGB 13.4 08/06/2022   HCT 38.0 (L) 08/06/2022   MCV 85.6 08/06/2022   PLT 198 08/06/2022    BMET    Component Value Date/Time   NA 137 08/06/2022 0510   K 3.3 (L) 08/06/2022 0510   CL 102 08/06/2022 0510   CO2 28 08/06/2022 0510   GLUCOSE 119 (H) 08/06/2022 0510   BUN 20 08/06/2022 0510   CREATININE 0.94 08/06/2022  0510   CALCIUM 9.0 08/06/2022 0510   GFRNONAA >60 08/06/2022 0510   GFRAA >60 06/01/2019 0822   CrCl cannot be calculated (Unknown ideal weight.).  COAG No results found for: "INR", "PROTIME"  Radiology CARDIAC CATHETERIZATION  Result Date: 08/05/2022   Prox RCA lesion is 90% stenosed.   Dist RCA lesion is 90% stenosed.   Dist LAD lesion is 95% stenosed.   A drug-eluting stent was successfully placed using a [place] 2.25X15.   A drug-eluting stent was successfully placed using a STENT ONYX FRONTIER 2.75X26.   Post intervention, there is a 0% residual stenosis.   Post intervention, there is a 0% residual stenosis.   The left ventricular systolic function is normal.   LV end diastolic pressure is mildly elevated.   The left ventricular ejection fraction is 55-65% by visual estimate. 1.  Two-vessel coronary artery disease with 90% stenosis proximal and distal RCA, and 95% stenosis distal LAD 2.  Normal left ventricular function 3.  Successful PCI with DES to proximal and distal RCA Recommendations 1.  Continue dual antiplatelet therapy uninterrupted for 1 year 2.  Add metoprolol to tartrate 25 mg twice daily 3.  Aggressive risk factor modification 4.  Enroll in cardiac rehabilitation 5.  Consider staged PCI of distal LAD stenosis   CT CORONARY FFR DATA PREP & FLUID ANALYSIS  Result Date: 07/29/2022 EXAM: CT FFR ANALYSIS CLINICAL DATA:  Abnormal CCTA FINDINGS: FFRct analysis was performed on the original cardiac CT angiogram dataset. Diagrammatic representation of the FFRct analysis is provided in a separate PDF document in PACS. This dictation was created using the PDF document and an interactive 3D model of the results. 3D model is not available in the EMR/PACS. Normal FFR range is >0.80. 1. Left Main:  No significant stenosis. 2. LAD: significant stenosis in the distal LAD.  FFRct 0.64 3. LCX: significant stenosis in the proximal LCx.  FFRct 0.73 4. RCA: significant stenosis in the  distal RCA/proximal PDA. FFRct 0.68 IMPRESSION: 1. CT FFR analysis showed significant stenosis in the distal LAD, RCA and proximal LCx. 2.  Cardiac catheterization recommended. Electronically Signed   By: Kate Sable M.D.   On: 07/29/2022 08:26   CT CORONARY MORPH W/CTA COR W/SCORE W/CA W/CM &/OR WO/CM  Addendum Date: 07/28/2022   ADDENDUM REPORT: 07/28/2022 16:31 EXAM: OVER-READ INTERPRETATION  CT CHEST The following report is an  over-read performed by radiologist Dr. Nettie [facility] on 07/28/2022. This over-read does not include interpretation of cardiac or coronary anatomy or pathology. The coronary calcium score/coronary CTA interpretation by the cardiologist is attached. COMPARISON:  None. FINDINGS: Vascular: There are no significant non-cardiac vascular findings. Mild atherosclerotic calcification of the descending thoracic aorta. Mediastinum/Nodes: There are no enlarged lymph nodes.The visualized esophagus demonstrates no significant findings. Lungs/Pleura: Clear lungs. No pneumothorax or pleural effusion. Upper abdomen: No acute abnormality. Musculoskeletal/Chest wall: No chest wall abnormality. No acute or significant osseous findings. IMPRESSION: 1. No significant extracardiac findings. 2.  Aortic Atherosclerosis (ICD10-I70.0). Electronically Signed   By: Titus Dubin M.D.   On: 07/28/2022 16:31   Result Date: 07/28/2022 CLINICAL DATA:  Chest pain EXAM: Cardiac/Coronary  CTA TECHNIQUE: The patient was scanned on a Siemens Somatom go.Top scanner. : A prospective scan was triggered in the ascending thoracic aorta. Axial non-contrast 3 mm slices were carried out through the heart. The data set was analyzed on a dedicated work station and scored using the [place]. Gantry rotation speed was 330 msecs and collimation was .6 mm. '100mg'$  of metoprolol and 0.8 mg of sl NTG was given. The 3D data set was reconstructed in 5% intervals of the 60-95 % of the R-R cycle.  Diastolic phases were analyzed on a dedicated work station using MPR, MIP and VRT modes. The patient received 100 cc of contrast. FINDINGS: Aorta: Normal size. Mild descending aorta calcifications. No dissection. Aortic Valve:  Trileaflet.  No calcifications. Coronary Arteries:  Normal coronary origin.  Right dominance. RCA is a dominant artery that gives rise to PDA and PLA. There is non calcified plaque in the mid segment causing severe stenosis (>70%). Left main gives rise to LAD and LCX arteries. LAD has non calcified plaque distally causing severe stenosis (>75%). There is non calcified plaque proximally causing mild stenosis (25-49%). LCX is a non-dominant artery that gives rise to two branches. There is calcified and noncalcified plaque proximally causing mild stenosis (25-49%). Other findings: Normal pulmonary vein drainage into the left atrium. Normal left atrial appendage without a thrombus. Normal size of the pulmonary artery. IMPRESSION: 1. Coronary calcium score of 218. This was 59th percentile for age and sex matched control. 2. Normal coronary origin with right dominance. 3. Severe stenosis in the mid RCA and distal LAD (>70%). 4. Mild stenosis in the proximal LAD and LCx (25-49%). 5. CAD-RADS 4 Severe stenosis. (70-99% or > 50% left main). Cardiac catheterization is recommended. Consider symptom-guided anti-ischemic pharmacotherapy as well as risk factor modification per guideline directed care. 6. Additional analysis with CT FFR will be submitted and reported separately. Electronically Signed: By: Kate Sable M.D. On: 07/28/2022 15:39     Assessment/Plan 1. Lymphedema Recommend:  I have had a long discussion with the patient regarding swelling and why it  causes symptoms.  Patient will begin wearing graduated compression on a daily basis a prescription was given. The patient will  wear the stockings first thing in the morning and removing them in the evening. The patient is instructed  specifically not to sleep in the stockings.   In addition, behavioral modification will be initiated.  This will include frequent elevation, use of over the counter pain medications and exercise such as walking.  Consideration for a lymph pump will also be made based upon the effectiveness of conservative therapy.  This would help to improve the edema control and prevent sequela such as ulcers and infections   Depending  on the results of conservative treatment the patient should undergo duplex ultrasound of the venous system to ensure that DVT or reflux is not present.   2. Raynaud's phenomenon without gangrene Recommend:  The patient is currently tolerating the Raynaud's changes fairly well. Lengthy discussion regarding keeping the feet and the hands warm; gloves, washing with only warm water (especially avoiding cold water immersion) and using wool socks, specifically Smartwool was recommended.  Possibility of using Norvasc was discussed but it was decided to hold off for now until the benefits of conservative therapy is assessed.  The use of Pletal was also reviewed as well as the fact that it is an off label use which is typically reserved for failures of Norvasc and conservative therapy.     3. Malignant hypertension Recommend:  Given patient's arterial disease optimal control of the patient's hypertension is important.  BP is not acceptable today  The patient's vital signs and noninvasive studies suggest a significant possibility for renal artery stenosis.  Noninvasive studies are indicated at this time and a renal artery duplex will be ordered.  The patient will continue the current antihypertensive medications, no changes at this time.  The primary medical service will continue aggressive antihypertensive therapy as per the AHA guidelines  - VAS US RENAL ARTERY DUPLEX; Future  4. PAD (peripheral artery disease) ([place]) Recommend:  Patient should undergo ABI's to evaluate  the degree of PAD.  The patient states they are having increased pain and a marked decrease in the distance that they can walk.  The risks and benefits as well as the alternatives were discussed in detail with the patient.  All questions were answered.  Patient agrees to proceed and understands this could be a prelude to angiography and intervention.  The patient will follow up with me in the office to review the studies.  - VAS [place] ABI WITH/WO TBI; Future  5. Stenosis of carotid artery, unspecified laterality Recommend:  Given the patient's asymptomatic subcritical stenosis no further invasive testing or surgery at this time.  Continue antiplatelet therapy as prescribed Continue management of CAD, HTN and Hyperlipidemia Healthy heart diet,  encouraged exercise at least 4 times per week Follow up in 6 months with duplex ultrasound and physical exam     Hortencia Pilar, MD  08/20/2022 5:20 PM

## 2022-08-21 ENCOUNTER — Encounter (INDEPENDENT_AMBULATORY_CARE_PROVIDER_SITE_OTHER): Payer: Self-pay | Admitting: Vascular Surgery

## 2022-08-21 ENCOUNTER — Ambulatory Visit (INDEPENDENT_AMBULATORY_CARE_PROVIDER_SITE_OTHER): Payer: PPO | Admitting: Vascular Surgery

## 2022-08-21 VITALS — BP 213/100 | HR 111 | Resp 18 | Ht 68.0 in | Wt 175.0 lb

## 2022-08-21 DIAGNOSIS — I739 Peripheral vascular disease, unspecified: Secondary | ICD-10-CM

## 2022-08-21 DIAGNOSIS — I6529 Occlusion and stenosis of unspecified carotid artery: Secondary | ICD-10-CM

## 2022-08-21 DIAGNOSIS — I7300 Raynaud's syndrome without gangrene: Secondary | ICD-10-CM

## 2022-08-21 DIAGNOSIS — I10 Essential (primary) hypertension: Secondary | ICD-10-CM | POA: Diagnosis not present

## 2022-08-21 DIAGNOSIS — I890 Lymphedema, not elsewhere classified: Secondary | ICD-10-CM

## 2022-08-24 ENCOUNTER — Encounter (INDEPENDENT_AMBULATORY_CARE_PROVIDER_SITE_OTHER): Payer: Self-pay | Admitting: Vascular Surgery

## 2022-08-24 DIAGNOSIS — I890 Lymphedema, not elsewhere classified: Secondary | ICD-10-CM | POA: Insufficient documentation

## 2022-08-24 DIAGNOSIS — I739 Peripheral vascular disease, unspecified: Secondary | ICD-10-CM | POA: Insufficient documentation

## 2022-08-24 DIAGNOSIS — I7300 Raynaud's syndrome without gangrene: Secondary | ICD-10-CM | POA: Insufficient documentation

## 2022-08-28 ENCOUNTER — Encounter: Payer: Self-pay | Admitting: Cardiology

## 2022-08-28 ENCOUNTER — Other Ambulatory Visit: Payer: Self-pay

## 2022-08-28 ENCOUNTER — Encounter
Admission: RE | Disposition: A | Discharge status: Home / Self Care | Payer: Self-pay | Source: Home / Self Care | Attending: Cardiology

## 2022-08-28 ENCOUNTER — Observation Stay
Admission: RE | Admit: 2022-08-28 | Discharge: 2022-08-28 | Disposition: A | Discharge status: Home / Self Care | Payer: PPO | Attending: Cardiology | Admitting: Cardiology

## 2022-08-28 DIAGNOSIS — I2510 Atherosclerotic heart disease of native coronary artery without angina pectoris: Principal | ICD-10-CM | POA: Diagnosis present

## 2022-08-28 DIAGNOSIS — Z955 Presence of coronary angioplasty implant and graft: Secondary | ICD-10-CM | POA: Insufficient documentation

## 2022-08-28 DIAGNOSIS — I70219 Atherosclerosis of native arteries of extremities with intermittent claudication, unspecified extremity: Secondary | ICD-10-CM | POA: Diagnosis not present

## 2022-08-28 DIAGNOSIS — I25119 Atherosclerotic heart disease of native coronary artery with unspecified angina pectoris: Secondary | ICD-10-CM | POA: Diagnosis not present

## 2022-08-28 DIAGNOSIS — R079 Chest pain, unspecified: Secondary | ICD-10-CM

## 2022-08-28 DIAGNOSIS — Z01818 Encounter for other preprocedural examination: Secondary | ICD-10-CM

## 2022-08-28 HISTORY — PX: CORONARY STENT INTERVENTION: CATH118234

## 2022-08-28 HISTORY — DX: Atherosclerotic heart disease of native coronary artery without angina pectoris: I25.10

## 2022-08-28 LAB — POCT ACTIVATED CLOTTING TIME: Activated Clotting Time: 412 seconds

## 2022-08-28 LAB — GLUCOSE, CAPILLARY: Glucose-Capillary: 81 mg/dL (ref 70–99)

## 2022-08-28 SURGERY — CORONARY STENT INTERVENTION
Anesthesia: Moderate Sedation | Laterality: Left

## 2022-08-28 MED ORDER — SODIUM CHLORIDE 0.9 % IV SOLN: 250.0000 mL | INTRAVENOUS | Status: DC | PRN

## 2022-08-28 MED ORDER — AMLODIPINE BESYLATE 5 MG PO TABS: 2.5000 mg | ORAL_TABLET | Freq: Two times a day (BID) | ORAL | Status: DC

## 2022-08-28 MED ORDER — ASPIRIN 81 MG PO CHEW
CHEWABLE_TABLET | ORAL | Status: AC
  Filled 2022-08-28: qty 1

## 2022-08-28 MED ORDER — ASPIRIN 81 MG PO CHEW
81.0000 mg | CHEWABLE_TABLET | ORAL | Status: AC
  Administered 2022-08-28: 81 mg via ORAL

## 2022-08-28 MED ORDER — SODIUM CHLORIDE 0.9 % WEIGHT BASED INFUSION
3.0000 mL/kg/h | INTRAVENOUS | Status: AC
  Administered 2022-08-28: 3 mL/kg/h via INTRAVENOUS

## 2022-08-28 MED ORDER — MIDAZOLAM HCL 2 MG/2ML IJ SOLN
INTRAMUSCULAR | Status: AC
  Filled 2022-08-28: qty 2

## 2022-08-28 MED ORDER — METOPROLOL TARTRATE 25 MG PO TABS: 12.5000 mg | ORAL_TABLET | Freq: Two times a day (BID) | ORAL | Status: DC

## 2022-08-28 MED ORDER — SODIUM CHLORIDE 0.9% FLUSH: 3.0000 mL | Freq: Two times a day (BID) | INTRAVENOUS | Status: DC

## 2022-08-28 MED ORDER — TRAZODONE HCL 50 MG PO TABS: 50.0000 mg | ORAL_TABLET | Freq: Every day | ORAL | Status: DC

## 2022-08-28 MED ORDER — FENTANYL CITRATE (PF) 100 MCG/2ML IJ SOLN
INTRAMUSCULAR | Status: AC
  Filled 2022-08-28: qty 2

## 2022-08-28 MED ORDER — VERAPAMIL HCL 2.5 MG/ML IV SOLN
INTRAVENOUS | Status: AC
  Filled 2022-08-28: qty 2

## 2022-08-28 MED ORDER — HEPARIN (PORCINE) IN NACL 1000-0.9 UT/500ML-% IV SOLN
INTRAVENOUS | Status: AC
  Filled 2022-08-28: qty 1000

## 2022-08-28 MED ORDER — IOHEXOL 300 MG/ML  SOLN
INTRAMUSCULAR | Status: DC | PRN
  Administered 2022-08-28: 115 mL

## 2022-08-28 MED ORDER — CLOPIDOGREL BISULFATE 75 MG PO TABS
ORAL_TABLET | ORAL | Status: AC
  Filled 2022-08-28: qty 4

## 2022-08-28 MED ORDER — METOPROLOL TARTRATE 25 MG PO TABS
12.5000 mg | ORAL_TABLET | Freq: Two times a day (BID) | ORAL | Status: DC
  Filled 2022-08-28 (×2): qty 0.5

## 2022-08-28 MED ORDER — FENTANYL CITRATE (PF) 100 MCG/2ML IJ SOLN
INTRAMUSCULAR | Status: DC | PRN
  Administered 2022-08-28 (×2): 25 ug via INTRAVENOUS

## 2022-08-28 MED ORDER — HYDRALAZINE HCL 20 MG/ML IJ SOLN: 10.0000 mg | INTRAMUSCULAR | Status: DC | PRN

## 2022-08-28 MED ORDER — ATORVASTATIN CALCIUM 80 MG PO TABS: 80.0000 mg | ORAL_TABLET | Freq: Every evening | ORAL | Status: DC

## 2022-08-28 MED ORDER — HEPARIN SODIUM (PORCINE) 1000 UNIT/ML IJ SOLN
INTRAMUSCULAR | Status: AC
  Filled 2022-08-28: qty 10

## 2022-08-28 MED ORDER — CLOPIDOGREL BISULFATE 75 MG PO TABS
ORAL_TABLET | ORAL | Status: DC | PRN
  Administered 2022-08-28: 300 mg via ORAL

## 2022-08-28 MED ORDER — METOPROLOL TARTRATE 5 MG/5ML IV SOLN
INTRAVENOUS | Status: DC | PRN
  Administered 2022-08-28: 5 mg via INTRAVENOUS

## 2022-08-28 MED ORDER — ACETAMINOPHEN 325 MG PO TABS: 650.0000 mg | ORAL_TABLET | ORAL | Status: DC | PRN

## 2022-08-28 MED ORDER — PIOGLITAZONE HCL 30 MG PO TABS: 30.0000 mg | ORAL_TABLET | Freq: Every day | ORAL | Status: DC

## 2022-08-28 MED ORDER — LIDOCAINE HCL (PF) 1 % IJ SOLN
INTRAMUSCULAR | Status: DC | PRN
  Administered 2022-08-28: 2 mL

## 2022-08-28 MED ORDER — HEPARIN SODIUM (PORCINE) 1000 UNIT/ML IJ SOLN
INTRAMUSCULAR | Status: DC | PRN
  Administered 2022-08-28: 10000 [IU] via INTRAVENOUS

## 2022-08-28 MED ORDER — SODIUM CHLORIDE 0.9 % WEIGHT BASED INFUSION: 1.0000 mL/kg/h | INTRAVENOUS | Status: DC

## 2022-08-28 MED ORDER — ONDANSETRON HCL 4 MG/2ML IJ SOLN: 4.0000 mg | Freq: Four times a day (QID) | INTRAMUSCULAR | Status: DC | PRN

## 2022-08-28 MED ORDER — HYDROCHLOROTHIAZIDE 12.5 MG PO TABS: 12.5000 mg | ORAL_TABLET | Freq: Every day | ORAL | Status: DC

## 2022-08-28 MED ORDER — HEPARIN (PORCINE) IN NACL 1000-0.9 UT/500ML-% IV SOLN
INTRAVENOUS | Status: DC | PRN
  Administered 2022-08-28: 1000 mL

## 2022-08-28 MED ORDER — MIDAZOLAM HCL 2 MG/2ML IJ SOLN
INTRAMUSCULAR | Status: DC | PRN
  Administered 2022-08-28 (×2): 1 mg via INTRAVENOUS

## 2022-08-28 MED ORDER — VERAPAMIL HCL 2.5 MG/ML IV SOLN
INTRAVENOUS | Status: DC | PRN
  Administered 2022-08-28: 2.5 mg via INTRA_ARTERIAL

## 2022-08-28 MED ORDER — ASPIRIN 81 MG PO CHEW: 81.0000 mg | CHEWABLE_TABLET | Freq: Every day | ORAL | Status: DC

## 2022-08-28 MED ORDER — SODIUM CHLORIDE 0.9% FLUSH: 3.0000 mL | INTRAVENOUS | Status: DC | PRN

## 2022-08-28 MED ORDER — NITROGLYCERIN 1 MG/10 ML FOR IR/CATH LAB
INTRA_ARTERIAL | Status: AC
  Filled 2022-08-28: qty 10

## 2022-08-28 MED ORDER — NITROGLYCERIN 1 MG/10 ML FOR IR/CATH LAB
INTRA_ARTERIAL | Status: DC | PRN
  Administered 2022-08-28 (×2): 200 ug via INTRACORONARY

## 2022-08-28 MED ORDER — TRAMADOL HCL 50 MG PO TABS: 50.0000 mg | ORAL_TABLET | Freq: Four times a day (QID) | ORAL | Status: DC | PRN

## 2022-08-28 MED ORDER — CLOPIDOGREL BISULFATE 75 MG PO TABS: 75.0000 mg | ORAL_TABLET | Freq: Every day | ORAL | Status: DC

## 2022-08-28 MED ORDER — LABETALOL HCL 5 MG/ML IV SOLN: 10.0000 mg | INTRAVENOUS | Status: DC | PRN

## 2022-08-28 MED ORDER — LIDOCAINE HCL 1 % IJ SOLN
INTRAMUSCULAR | Status: AC
  Filled 2022-08-28: qty 20

## 2022-08-28 MED ORDER — METOPROLOL TARTRATE 5 MG/5ML IV SOLN
INTRAVENOUS | Status: AC
  Filled 2022-08-28: qty 5

## 2022-08-28 SURGICAL SUPPLY — 19 items
BALLN TREK RX 2.25X12 (BALLOONS) ×1
BALLOON TREK RX 2.25X12 (BALLOONS) IMPLANT
CATH INFINITI JR4 5F (CATHETERS) IMPLANT
CATH LAUNCHER 6FR EBU3.5 (CATHETERS) IMPLANT
DEVICE RAD TR BAND REGULAR (VASCULAR PRODUCTS) IMPLANT
DRAPE BRACHIAL (DRAPES) IMPLANT
GLIDESHEATH SLEND SS 6F .021 (SHEATH) IMPLANT
GUIDEWIRE INQWIRE 1.5J.035X260 (WIRE) IMPLANT
INQWIRE 1.5J .035X260CM (WIRE) ×1
KIT ENCORE 26 ADVANTAGE (KITS) IMPLANT
KIT SYRINGE INJ CVI SPIKEX1 (MISCELLANEOUS) IMPLANT
PACK CARDIAC CATH (CUSTOM PROCEDURE TRAY) ×1 IMPLANT
PROTECTION STATION PRESSURIZED (MISCELLANEOUS) ×1
SET ATX SIMPLICITY (MISCELLANEOUS) IMPLANT
STATION PROTECTION PRESSURIZED (MISCELLANEOUS) IMPLANT
STENT ONYX FRONTIER 2.25X15 (Permanent Stent) IMPLANT
TUBING CIL FLEX 10 FLL-RA (TUBING) IMPLANT
WIRE ASAHI PROWATER 180CM (WIRE) IMPLANT
WIRE HITORQ VERSACORE ST 145CM (WIRE) IMPLANT

## 2022-08-28 NOTE — Discharge Summary (Addendum)
Discharge Summary      Patient ID: Marcus Huang MRN: [id number] DOB/AGE: February 25, 1951 71 y.o.  Admit date: 08/28/2022 Discharge date: 08/28/2022  Primary Discharge Diagnosis CAD  Secondary Discharge Diagnosis s/p coronary stent   Significant Diagnostic Studies:   LHC and coronary angiography with PCI - Dr. Saralyn Pilar 08/28/22   Dist LAD lesion is 95% stenosed.   Non-stenotic Prox RCA lesion was previously treated.   Non-stenotic Dist RCA lesion was previously treated.   A drug-eluting stent was successfully placed using a Poydras 2.25X15.   Post intervention, there is a 0% residual stenosis.   1.  Patent stents proximal and distal RCA 2.  95% stenosis distal LAD 3.  Successful PCI with 2.25 x 15 mm Onyx frontier DES distal LAD   Recommendations   1.  Dual antiplatelet therapy uninterrupted x 1 year 2.  Enroll in cardiac rehabilitation 3.  Follow-up in 1 week  Consults: none  Hospital Course: Marcus Huang was brought to the cardiac cath lab and underwent left heart catheterization and coronary angiography with plan for staged intervention of his distal LAD stenosis with Dr. Isaias Cowman on 08/28/2022. The patient tolerated with procedure well without complications.  The patient received 1 stent to the distal LAD, previously placed stents (2) in the proximal and distal RCA were patent. Later the same day, the patient's right wrist arteriotomy site was examined and found to be without significant erythema, tenderness to palpation, or apparent aneurysm. Hospital course was overall uneventful, on day of discharge the patient was ambulatory, chest pain free, and eager to go home.  Discussed cardiac rehab and prescription medications in detail. The patient was given aftercare instructions and ER return precautions. Will arrange for follow up in office in 1 week, or sooner if needed.     Discharge Exam: Blood pressure (!) 147/75, pulse 65, temperature 97.8 F  (36.6 C), temperature source Oral, resp. rate 13, height 5' 8" (1.727 m), weight 76.2 kg, SpO2 97 %.   PHYSICAL EXAM General: Pleasant caucasian male, well nourished, in no acute distress. Sitting upright in bed in specials. HEENT:  Normocephalic and atraumatic. Neck:   No JVD.  Lungs: Normal respiratory effort on room air.  Clear to ascultation bilaterally. Heart: HRRR . Normal S1 and S2 without gallops or murmurs. Radial and DP pulses 2+ bilaterally. Abdomen: non-distended appearing.  Msk: Normal strength and tone for age. Extremities: No clubbing, cyanosis, edema. R wrist with gauze and tegaderm in place without bleeding, mild tenderness to palpation. Radial pulse 2+ proximally and distal to arteriotomy. R brachial pulse 2+ Neuro: Alert and oriented x3 Psych:  mood appropriate for situation.    Labs:   Lab Results  Component Value Date   WBC 7.3 08/06/2022   HGB 13.4 08/06/2022   HCT 38.0 (L) 08/06/2022   MCV 85.6 08/06/2022   PLT 198 08/06/2022    EKG: NSR rate 83, no acute changes  FOLLOW UP PLANS AND APPOINTMENTS Discharge Instructions     AMB Referral to Cardiac Rehabilitation - Phase II   Complete by: As directed    Diagnosis: Coronary Stents   After initial evaluation and assessments completed: Virtual Based Care may be provided alone or in conjunction with Phase 2 Cardiac Rehab based on patient barriers.: Yes   Intensive Cardiac Rehabilitation (ICR) [place] location only OR Traditional Cardiac Rehabilitation (TCR) *If criteria for ICR are not met will enroll in TCR [facility] only): Yes      Allergies  as of 08/28/2022       Reactions   Metformin Other (See Comments)   Stomach problems   Rosuvastatin Other (See Comments)   fatigue        Medication List     TAKE these medications    amLODipine 2.5 MG tablet Commonly known as: NORVASC Take 1 tablet (2.5 mg total) by mouth in the morning and at bedtime.   aspirin EC 81 MG tablet Take 1 tablet (81 mg total) by  mouth daily at 12 noon.   atorvastatin 80 MG tablet Commonly known as: LIPITOR Take 1 tablet (80 mg total) by mouth at bedtime.   azelastine 0.1 % nasal spray Commonly known as: ASTELIN Place into the nose. PRN   clopidogrel 75 MG tablet Commonly known as: Plavix Take 1 tablet (75 mg total) by mouth daily.   hydrochlorothiazide 25 MG tablet Commonly known as: HYDRODIURIL Take 1 tablet (25 mg total) by mouth daily.   metoprolol tartrate 25 MG tablet Commonly known as: LOPRESSOR Take 0.5 tablets (12.5 mg total) by mouth 2 (two) times daily.   multivitamin tablet Take 1 tablet by mouth daily.   pioglitazone 30 MG tablet Commonly known as: ACTOS Take 30 mg by mouth daily.   PreviDent 5000 Sensitive 1.1-5 % Gel Generic drug: Sod Fluoride-Potassium Nitrate Place onto teeth as directed.   traMADol 50 MG tablet Commonly known as: ULTRAM TAKE 1 TABLET(50 MG) BY MOUTH EVERY 6 HOURS AS NEEDED FOR PAIN   VITAMIN D PO Take 5,000 Units by mouth daily.        Follow-up Information     Isaias Cowman, MD. Go on 09/04/2022.   Specialty: Cardiology Why: at 3pm Contact information: [facility] [place] [id number] [phone]                 [place] [facility] APPOINTMENTS  Time spent with patient: >30 mins Signed:  Tristan Schroeder PA-C 08/28/2022, 3:13 PM [facility]

## 2022-08-29 ENCOUNTER — Encounter: Payer: Self-pay | Admitting: Cardiology

## 2022-09-03 DIAGNOSIS — I10 Essential (primary) hypertension: Secondary | ICD-10-CM | POA: Diagnosis not present

## 2022-09-03 DIAGNOSIS — E785 Hyperlipidemia, unspecified: Secondary | ICD-10-CM | POA: Diagnosis not present

## 2022-09-03 DIAGNOSIS — G8929 Other chronic pain: Secondary | ICD-10-CM | POA: Diagnosis not present

## 2022-09-03 DIAGNOSIS — I700 Atherosclerosis of aorta: Secondary | ICD-10-CM | POA: Diagnosis not present

## 2022-09-03 DIAGNOSIS — E1165 Type 2 diabetes mellitus with hyperglycemia: Secondary | ICD-10-CM | POA: Diagnosis not present

## 2022-09-04 DIAGNOSIS — I10 Essential (primary) hypertension: Secondary | ICD-10-CM | POA: Diagnosis not present

## 2022-09-04 DIAGNOSIS — E119 Type 2 diabetes mellitus without complications: Secondary | ICD-10-CM | POA: Diagnosis not present

## 2022-09-04 DIAGNOSIS — I700 Atherosclerosis of aorta: Secondary | ICD-10-CM | POA: Diagnosis not present

## 2022-09-04 DIAGNOSIS — E7800 Pure hypercholesterolemia, unspecified: Secondary | ICD-10-CM | POA: Diagnosis not present

## 2022-09-04 DIAGNOSIS — R002 Palpitations: Secondary | ICD-10-CM | POA: Diagnosis not present

## 2022-09-04 DIAGNOSIS — Z955 Presence of coronary angioplasty implant and graft: Secondary | ICD-10-CM | POA: Diagnosis not present

## 2022-09-09 ENCOUNTER — Encounter: Payer: PPO | Attending: Cardiology | Admitting: *Deleted

## 2022-09-09 DIAGNOSIS — Z955 Presence of coronary angioplasty implant and graft: Secondary | ICD-10-CM

## 2022-09-09 NOTE — Progress Notes (Signed)
Initial phone call completed. Diagnosis can be found in [facility] 12/14. EP Orientation scheduled for Wednesday 1/3 at 10:30.

## 2022-09-10 ENCOUNTER — Ambulatory Visit: Payer: PPO

## 2022-09-17 ENCOUNTER — Encounter: Payer: PPO | Attending: Cardiology | Admitting: *Deleted

## 2022-09-17 VITALS — Ht 68.0 in | Wt 172.0 lb

## 2022-09-17 DIAGNOSIS — Z955 Presence of coronary angioplasty implant and graft: Secondary | ICD-10-CM | POA: Diagnosis not present

## 2022-09-17 NOTE — Progress Notes (Signed)
Cardiac Individual Treatment Plan  Patient Details  Name: Marcus Huang MRN: [id number] Date of Birth: [birth year] Referring Provider:   Flowsheet Row Cardiac Rehab from 09/17/2022 in [facility] Cardiac and Pulmonary Rehab  Referring Provider Paraschos/Fath       Initial Encounter Date:  Flowsheet Row Cardiac Rehab from 09/17/2022 in [facility] Cardiac and Pulmonary Rehab  Date 09/17/22       Visit Diagnosis: Status post coronary artery stent placement  Patient's Home Medications on Admission:  Current Outpatient Medications:    amLODipine (NORVASC) 2.5 MG tablet, Take 1 tablet (2.5 mg total) by mouth in the morning and at bedtime., Disp: 60 tablet, Rfl: 11   aspirin EC 81 MG tablet, Take 1 tablet (81 mg total) by mouth daily at 12 noon., Disp: 30 tablet, Rfl: 2   atorvastatin (LIPITOR) 80 MG tablet, Take 1 tablet (80 mg total) by mouth at bedtime., Disp: 30 tablet, Rfl: 11   azelastine (ASTELIN) 0.1 % nasal spray, Place into the nose. PRN, Disp: , Rfl:    clopidogrel (PLAVIX) 75 MG tablet, Take 1 tablet (75 mg total) by mouth daily., Disp: 30 tablet, Rfl: 11   hydrochlorothiazide (HYDRODIURIL) 25 MG tablet, Take 1 tablet (25 mg total) by mouth daily., Disp: 30 tablet, Rfl: 11   metoprolol tartrate (LOPRESSOR) 25 MG tablet, Take 0.5 tablets (12.5 mg total) by mouth 2 (two) times daily., Disp: 30 tablet, Rfl: 11   Multiple Vitamin (MULTIVITAMIN) tablet, Take 1 tablet by mouth daily., Disp: , Rfl:    pioglitazone (ACTOS) 30 MG tablet, Take 30 mg by mouth daily., Disp: , Rfl:    PREVIDENT 5000 SENSITIVE 1.1-5 % GEL, Place onto teeth as directed., Disp: , Rfl:    traMADol (ULTRAM) 50 MG tablet, TAKE 1 TABLET(50 MG) BY MOUTH EVERY 6 HOURS AS NEEDED FOR PAIN, Disp: , Rfl:    VITAMIN D PO, Take 5,000 Units by mouth daily., Disp: , Rfl:   Past Medical History: Past Medical History:  Diagnosis Date   Carotid artery thrombosis    Left Carotid Artery   Coronary artery disease    Diabetes (HCC)     Diverticulosis    GERD (gastroesophageal reflux disease)    History of kidney stones    Hyperlipidemia    Hypertension    Renal mass    Sleep apnea    C-PAP    Tobacco Use: Social History   Tobacco Use  Smoking Status Never  Smokeless Tobacco Never    Labs: Review Flowsheet        No data to display           Exercise Target Goals: Exercise Program Goal: Individual exercise prescription set using results from initial 6 min walk test and THRR while considering  patient's activity barriers and safety.   Exercise Prescription Goal: Initial exercise prescription builds to 30-45 minutes a day of aerobic activity, 2-3 days per week.  Home exercise guidelines will be given to patient during program as part of exercise prescription that the participant will acknowledge.   Education: Aerobic Exercise: - Group verbal and visual presentation on the components of exercise prescription. Introduces F.I.T.T principle from ACSM for exercise prescriptions.  Reviews F.I.T.T. principles of aerobic exercise including progression. Written material given at graduation.   Education: Resistance Exercise: - Group verbal and visual presentation on the components of exercise prescription. Introduces F.I.T.T principle from ACSM for exercise prescriptions  Reviews F.I.T.T. principles of resistance exercise including progression. Written material given at graduation.  Education: Exercise & Equipment Safety: - Individual verbal instruction and demonstration of equipment use and safety with use of the equipment. Flowsheet Row Cardiac Rehab from 09/17/2022 in [facility] Cardiac and Pulmonary Rehab  Date 09/17/22  Educator [facility]  Instruction Review Code 1- Verbalizes Understanding       Education: Exercise Physiology & General Exercise Guidelines: - Group verbal and written instruction with models to review the exercise physiology of the cardiovascular system and associated critical values. Provides  general exercise guidelines with specific guidelines to those with heart or lung disease.    Education: Flexibility, Balance, Mind/Body Relaxation: - Group verbal and visual presentation with interactive activity on the components of exercise prescription. Introduces F.I.T.T principle from ACSM for exercise prescriptions. Reviews F.I.T.T. principles of flexibility and balance exercise training including progression. Also discusses the mind body connection.  Reviews various relaxation techniques to help reduce and manage stress (i.e. Deep breathing, progressive muscle relaxation, and visualization). Balance handout provided to take home. Written material given at graduation.   Activity Barriers & Risk Stratification:  Activity Barriers & Cardiac Risk Stratification - 09/17/22 1248       Activity Barriers & Cardiac Risk Stratification   Activity Barriers None    Cardiac Risk Stratification Moderate             6 Minute Walk:  6 Minute Walk     Row Name 09/17/22 1246         6 Minute Walk   Phase Initial     Distance 1300 feet     Walk Time 6 minutes     # of Rest Breaks 0     MPH 2.46     METS 3.14     RPE 11     Perceived Dyspnea  0     VO2 Peak 10.98     Symptoms No     Resting HR 62 bpm     Resting BP 148/82     Resting Oxygen Saturation  99 %     Exercise Oxygen Saturation  during 6 min walk 98 %     Max Ex. HR 103 bpm     Max Ex. BP 138/62     2 Minute Post BP 124/72              Oxygen Initial Assessment:   Oxygen Re-Evaluation:   Oxygen Discharge (Final Oxygen Re-Evaluation):   Initial Exercise Prescription:  Initial Exercise Prescription - 09/17/22 1200       Date of Initial Exercise RX and Referring Provider   Date 09/17/22    Referring Provider Paraschos/Fath      Oxygen   Maintain Oxygen Saturation 88% or higher      Treadmill   MPH 2.5    Grade 1    Minutes 15    METs 3.26      Elliptical   Level 1    Speed 3    Minutes 1     METs 3.14      Prescription Details   Frequency (times per week) 2    Duration Progress to 30 minutes of continuous aerobic without signs/symptoms of physical distress      Intensity   THRR 40-80% of Max Heartrate 97-133    Ratings of Perceived Exertion 11-13    Perceived Dyspnea 0-4      Progression   Progression Continue to progress workloads to maintain intensity without signs/symptoms of physical distress.      Resistance Training  Training Prescription Yes    Weight 5    Reps 10-15             Perform Capillary Blood Glucose checks as needed.  Exercise Prescription Changes:   Exercise Prescription Changes     Row Name 09/17/22 1200             Response to Exercise   Blood Pressure (Admit) 148/82       Blood Pressure (Exercise) 138/62       Blood Pressure (Exit) 124/72       Heart Rate (Admit) 62 bpm       Heart Rate (Exercise) 103 bpm       Heart Rate (Exit) 64 bpm       Oxygen Saturation (Admit) 99 %       Oxygen Saturation (Exercise) 98 %       Oxygen Saturation (Exit) 98 %       Rating of Perceived Exertion (Exercise) 11       Perceived Dyspnea (Exercise) 0       Symptoms none       Comments 6 MWT results                Exercise Comments:   Exercise Goals and Review:   Exercise Goals     Row Name 09/17/22 1252             Exercise Goals   Increase Physical Activity Yes       Intervention Provide advice, education, support and counseling about physical activity/exercise needs.;Develop an individualized exercise prescription for aerobic and resistive training based on initial evaluation findings, risk stratification, comorbidities and participant's personal goals.       Expected Outcomes Short Term: Attend rehab on a regular basis to increase amount of physical activity.;Long Term: Add in home exercise to make exercise part of routine and to increase amount of physical activity.;Long Term: Exercising regularly at least 3-5 days a week.        Increase Strength and Stamina Yes       Intervention Provide advice, education, support and counseling about physical activity/exercise needs.;Develop an individualized exercise prescription for aerobic and resistive training based on initial evaluation findings, risk stratification, comorbidities and participant's personal goals.       Expected Outcomes Short Term: Increase workloads from initial exercise prescription for resistance, speed, and METs.;Short Term: Perform resistance training exercises routinely during rehab and add in resistance training at home;Long Term: Improve cardiorespiratory fitness, muscular endurance and strength as measured by increased METs and functional capacity (6MWT)       Able to understand and use rate of perceived exertion (RPE) scale Yes       Intervention Provide education and explanation on how to use RPE scale       Expected Outcomes Short Term: Able to use RPE daily in rehab to express subjective intensity level;Long Term:  Able to use RPE to guide intensity level when exercising independently       Able to understand and use Dyspnea scale Yes       Intervention Provide education and explanation on how to use Dyspnea scale       Expected Outcomes Short Term: Able to use Dyspnea scale daily in rehab to express subjective sense of shortness of breath during exertion;Long Term: Able to use Dyspnea scale to guide intensity level when exercising independently       Knowledge and understanding of Target Heart Rate Range (THRR) Yes  Intervention Provide education and explanation of THRR including how the numbers were predicted and where they are located for reference       Expected Outcomes Short Term: Able to state/look up THRR;Long Term: Able to use THRR to govern intensity when exercising independently;Short Term: Able to use daily as guideline for intensity in rehab       Able to check pulse independently Yes       Intervention Provide education and  demonstration on how to check pulse in carotid and radial arteries.;Review the importance of being able to check your own pulse for safety during independent exercise       Expected Outcomes Short Term: Able to explain why pulse checking is important during independent exercise       Understanding of Exercise Prescription Yes       Intervention Provide education, explanation, and written materials on patient's individual exercise prescription       Expected Outcomes Short Term: Able to explain program exercise prescription;Long Term: Able to explain home exercise prescription to exercise independently                Exercise Goals Re-Evaluation :   Discharge Exercise Prescription (Final Exercise Prescription Changes):  Exercise Prescription Changes - 09/17/22 1200       Response to Exercise   Blood Pressure (Admit) 148/82    Blood Pressure (Exercise) 138/62    Blood Pressure (Exit) 124/72    Heart Rate (Admit) 62 bpm    Heart Rate (Exercise) 103 bpm    Heart Rate (Exit) 64 bpm    Oxygen Saturation (Admit) 99 %    Oxygen Saturation (Exercise) 98 %    Oxygen Saturation (Exit) 98 %    Rating of Perceived Exertion (Exercise) 11    Perceived Dyspnea (Exercise) 0    Symptoms none    Comments 6 MWT results             Nutrition:  Target Goals: Understanding of nutrition guidelines, daily intake of sodium '1500mg'$ , cholesterol '200mg'$ , calories 30% from fat and 7% or less from saturated fats, daily to have 5 or more servings of fruits and vegetables.  Education: All About Nutrition: -Group instruction provided by verbal, written material, interactive activities, discussions, models, and posters to present general guidelines for heart healthy nutrition including fat, fiber, MyPlate, the role of sodium in heart healthy nutrition, utilization of the nutrition label, and utilization of this knowledge for meal planning. Follow up email sent as well. Written material given at  graduation. Flowsheet Row Cardiac Rehab from 09/17/2022 in [facility] Cardiac and Pulmonary Rehab  Education need identified 09/17/22       Biometrics:  Pre Biometrics - 09/17/22 1253       Pre Biometrics   Height '5\' 8"'$  (1.727 m)    Weight 172 lb (78 kg)    Waist Circumference 39.5 inches    Hip Circumference 39.5 inches    Waist to Hip Ratio 1 %    BMI (Calculated) 26.16              Nutrition Therapy Plan and Nutrition Goals:  Nutrition Therapy & Goals - 09/17/22 1335       Intervention Plan   Intervention Prescribe, educate and counsel regarding individualized specific dietary modifications aiming towards targeted core components such as weight, hypertension, lipid management, diabetes, heart failure and other comorbidities.;Nutrition handout(s) given to patient.    Expected Outcomes Short Term Goal: Understand basic principles of dietary content, such as  calories, fat, sodium, cholesterol and nutrients.             Nutrition Assessments:  MEDIFICTS Score Key: ?70 Need to make dietary changes  40-70 Heart Healthy Diet ? 40 Therapeutic Level Cholesterol Diet  Flowsheet Row Cardiac Rehab from 09/17/2022 in [facility] Cardiac and Pulmonary Rehab  Picture Your Plate Total Score on Admission 65      Picture Your Plate Scores: <94 Unhealthy dietary pattern with much room for improvement. 41-50 Dietary pattern unlikely to meet recommendations for good health and room for improvement. 51-60 More healthful dietary pattern, with some room for improvement.  >60 Healthy dietary pattern, although there may be some specific behaviors that could be improved.    Nutrition Goals Re-Evaluation:   Nutrition Goals Discharge (Final Nutrition Goals Re-Evaluation):   Psychosocial: Target Goals: Acknowledge presence or absence of significant depression and/or stress, maximize coping skills, provide positive support system. Participant is able to verbalize types and ability to use  techniques and skills needed for reducing stress and depression.   Education: Stress, Anxiety, and Depression - Group verbal and visual presentation to define topics covered.  Reviews how body is impacted by stress, anxiety, and depression.  Also discusses healthy ways to reduce stress and to treat/manage anxiety and depression.  Written material given at graduation.   Education: Sleep Hygiene -Provides group verbal and written instruction about how sleep can affect your health.  Define sleep hygiene, discuss sleep cycles and impact of sleep habits. Review good sleep hygiene tips.    Initial Review & Psychosocial Screening:  Initial Psych Review & Screening - 09/09/22 1307       Initial Review   Current issues with None Identified      Family Dynamics   Good Support System? Yes   wife, daughter, friends     Barriers   Psychosocial barriers to participate in program There are no identifiable barriers or psychosocial needs.;The patient should benefit from training in stress management and relaxation.      Screening Interventions   Interventions Encouraged to exercise;Provide feedback about the scores to participant;To provide support and resources with identified psychosocial needs    Expected Outcomes Short Term goal: Utilizing psychosocial counselor, staff and physician to assist with identification of specific Stressors or current issues interfering with healing process. Setting desired goal for each stressor or current issue identified.;Long Term Goal: Stressors or current issues are controlled or eliminated.;Long Term goal: The participant improves quality of Life and PHQ9 Scores as seen by post scores and/or verbalization of changes;Short Term goal: Identification and review with participant of any Quality of Life or Depression concerns found by scoring the questionnaire.             Quality of Life Scores:   Quality of Life - 09/17/22 1332       Quality of Life   Select  Quality of Life      Quality of Life Scores   Health/Function Pre 26.1 %    Socioeconomic Pre 24.38 %    Psych/Spiritual Pre 23.93 %    Family Pre 27.3 %    GLOBAL Pre 25.44 %            Scores of 19 and below usually indicate a poorer quality of life in these areas.  A difference of  2-3 points is a clinically meaningful difference.  A difference of 2-3 points in the total score of the Quality of Life Index has been associated with significant improvement in  overall quality of life, self-image, physical symptoms, and general health in studies assessing change in quality of life.  PHQ-9: Review Flowsheet       09/17/2022  Depression screen PHQ 2/9  Decreased Interest 0  Down, Depressed, Hopeless 1  PHQ - 2 Score 1  Altered sleeping 3  Tired, decreased energy 1  Change in appetite 0  Feeling bad or failure about yourself  1  Trouble concentrating 0  Moving slowly or fidgety/restless 1  Suicidal thoughts 0  PHQ-9 Score 7  Difficult doing work/chores Not difficult at all   Interpretation of Total Score  Total Score Depression Severity:  1-4 = Minimal depression, 5-9 = Mild depression, 10-14 = Moderate depression, 15-19 = Moderately severe depression, 20-27 = Severe depression   Psychosocial Evaluation and Intervention:  Psychosocial Evaluation - 09/09/22 1319       Psychosocial Evaluation & Interventions   Interventions Encouraged to exercise with the program and follow exercise prescription    Comments Prakash is coming to cardiac rehab post stent. He is very motivated to get back to his exercise routine and continue on his journey of a heart healthy lifestyle. He has worked hard to manage his diabetes and make healthy changes. He states he has a wonderful support system of his wife, daughter, and friends from his prayer group. He reports no stress concerns. When asked about sleep, he mentioned he has had a CPAP for 2 years and recently has stopped using it after weight loss and  the stent. He is going to follow up with his doctor soon to see if he still needs it. He is ready to start the program to learn more    Expected Outcomes Short: attend cardiac rehab for education and exercise. Long: develop and maintain positive self care habits.    Continue Psychosocial Services  Follow up required by staff             Psychosocial Re-Evaluation:   Psychosocial Discharge (Final Psychosocial Re-Evaluation):   Vocational Rehabilitation: Provide vocational rehab assistance to qualifying candidates.   Vocational Rehab Evaluation & Intervention:  Vocational Rehab - 09/09/22 1307       Initial Vocational Rehab Evaluation & Intervention   Assessment shows need for Vocational Rehabilitation No             Education: Education Goals: Education classes will be provided on a variety of topics geared toward better understanding of heart health and risk factor modification. Participant will state understanding/return demonstration of topics presented as noted by education test scores.  Learning Barriers/Preferences:  Learning Barriers/Preferences - 09/09/22 1307       Learning Barriers/Preferences   Learning Barriers None    Learning Preferences None             General Cardiac Education Topics:  AED/CPR: - Group verbal and written instruction with the use of models to demonstrate the basic use of the AED with the basic ABC's of resuscitation.   Anatomy and Cardiac Procedures: - Group verbal and visual presentation and models provide information about basic cardiac anatomy and function. Reviews the testing methods done to diagnose heart disease and the outcomes of the test results. Describes the treatment choices: Medical Management, Angioplasty, or Coronary Bypass Surgery for treating various heart conditions including Myocardial Infarction, Angina, Valve Disease, and Cardiac Arrhythmias.  Written material given at graduation. Flowsheet Row Cardiac Rehab  from 09/17/2022 in [facility] Cardiac and Pulmonary Rehab  Education need identified 09/17/22  Medication Safety: - Group verbal and visual instruction to review commonly prescribed medications for heart and lung disease. Reviews the medication, class of the drug, and side effects. Includes the steps to properly store meds and maintain the prescription regimen.  Written material given at graduation.   Intimacy: - Group verbal instruction through game format to discuss how heart and lung disease can affect sexual intimacy. Written material given at graduation..   Know Your Numbers and Heart Failure: - Group verbal and visual instruction to discuss disease risk factors for cardiac and pulmonary disease and treatment options.  Reviews associated critical values for Overweight/Obesity, Hypertension, Cholesterol, and Diabetes.  Discusses basics of heart failure: signs/symptoms and treatments.  Introduces Heart Failure Zone chart for action plan for heart failure.  Written material given at graduation.   Infection Prevention: - Provides verbal and written material to individual with discussion of infection control including proper hand washing and proper equipment cleaning during exercise session. Flowsheet Row Cardiac Rehab from 09/17/2022 in [facility] Cardiac and Pulmonary Rehab  Date 09/17/22  Educator [facility]  Instruction Review Code 1- Verbalizes Understanding       Falls Prevention: - Provides verbal and written material to individual with discussion of falls prevention and safety. Flowsheet Row Cardiac Rehab from 09/17/2022 in [facility] Cardiac and Pulmonary Rehab  Date 09/17/22  Educator [facility]  Instruction Review Code 1- Verbalizes Understanding       Other: -Provides group and verbal instruction on various topics (see comments)   Knowledge Questionnaire Score:  Knowledge Questionnaire Score - 09/17/22 1332       Knowledge Questionnaire Score   Pre Score 23/26             Core  Components/Risk Factors/Patient Goals at Admission:  Personal Goals and Risk Factors at Admission - 09/17/22 1335       Core Components/Risk Factors/Patient Goals on Admission    Weight Management Yes    Intervention Weight Management: Develop a combined nutrition and exercise program designed to reach desired caloric intake, while maintaining appropriate intake of nutrient and fiber, sodium and fats, and appropriate energy expenditure required for the weight goal.;Weight Management: Provide education and appropriate resources to help participant work on and attain dietary goals.    Admit Weight 172 lb (78 kg)    Goal Weight: Short Term 170 lb (77.1 kg)    Goal Weight: Long Term 170 lb (77.1 kg)    Expected Outcomes Short Term: Continue to assess and modify interventions until short term weight is achieved;Long Term: Adherence to nutrition and physical activity/exercise program aimed toward attainment of established weight goal;Weight Maintenance: Understanding of the daily nutrition guidelines, which includes 25-35% calories from fat, 7% or less cal from saturated fats, less than '200mg'$  cholesterol, less than 1.5gm of sodium, & 5 or more servings of fruits and vegetables daily;Understanding recommendations for meals to include 15-35% energy as protein, 25-35% energy from fat, 35-60% energy from carbohydrates, less than '200mg'$  of dietary cholesterol, 20-35 gm of total fiber daily;Understanding of distribution of calorie intake throughout the day with the consumption of 4-5 meals/snacks    Diabetes Yes    Intervention Provide education about signs/symptoms and action to take for hypo/hyperglycemia.;Provide education about proper nutrition, including hydration, and aerobic/resistive exercise prescription along with prescribed medications to achieve blood glucose in normal ranges: Fasting glucose 65-99 mg/dL    Expected Outcomes Short Term: Participant verbalizes understanding of the signs/symptoms and  immediate care of hyper/hypoglycemia, proper foot care and importance of medication,  aerobic/resistive exercise and nutrition plan for blood glucose control.;Long Term: Attainment of HbA1C < 7%.    Hypertension Yes    Intervention Provide education on lifestyle modifcations including regular physical activity/exercise, weight management, moderate sodium restriction and increased consumption of fresh fruit, vegetables, and low fat dairy, alcohol moderation, and smoking cessation.;Monitor prescription use compliance.    Expected Outcomes Short Term: Continued assessment and intervention until BP is < 140/55m HG in hypertensive participants. < 130/855mHG in hypertensive participants with diabetes, heart failure or chronic kidney disease.;Long Term: Maintenance of blood pressure at goal levels.             Education:Diabetes - Individual verbal and written instruction to review signs/symptoms of diabetes, desired ranges of glucose level fasting, after meals and with exercise. Acknowledge that pre and post exercise glucose checks will be done for 3 sessions at entry of program. [facility]  Date 09/17/22  [facility] Code 1- Verbalizes Understanding       Core Components/Risk Factors/Patient Goals Review:    Core Components/Risk Factors/Patient Goals at Discharge (Final Review):    ITP Comments:  ITP Comments     Ro[place]at[place]ame 09/09/22 1318 09/17/22 1244         ITP Comments Initial phone call completed. [facility] EP Orientation scheduled for Wednesday 1/3 at 10:30. Completed 6MWT and gym orientation. Initial ITP created and sent for review to Dr. MaEmily FilbertMedical Director.               Comments: initial ITP

## 2022-09-17 NOTE — Patient Instructions (Signed)
Patient Instructions  Patient Details  Name: Marcus Huang MRN: [id number] Date of Birth: 31-Jan-1951 Referring Provider:  Isaias Cowman, MD  Below are your personal goals for exercise, nutrition, and risk factors. Our goal is to help you stay on track towards obtaining and maintaining these goals. We will be discussing your progress on these goals with you throughout the program.  Initial Exercise Prescription:  Initial Exercise Prescription - 09/17/22 1200       Date of Initial Exercise RX and Referring Provider   Date 09/17/22    Referring Provider Paraschos/Fath      Oxygen   Maintain Oxygen Saturation 88% or higher      Treadmill   MPH 2.5    Grade 1    Minutes 15    METs 3.26      Elliptical   Level 1    Speed 3    Minutes 1    METs 3.14      Prescription Details   Frequency (times per week) 2    Duration Progress to 30 minutes of continuous aerobic without signs/symptoms of physical distress      Intensity   THRR 40-80% of Max Heartrate 97-133    Ratings of Perceived Exertion 11-13    Perceived Dyspnea 0-4      Progression   Progression Continue to progress workloads to maintain intensity without signs/symptoms of physical distress.      Resistance Training   Training Prescription Yes    Weight 5    Reps 10-15             Exercise Goals: Frequency: Be able to perform aerobic exercise two to three times per week in program working toward 2-5 days per week of home exercise.  Intensity: Work with a perceived exertion of 11 (fairly light) - 15 (hard) while following your exercise prescription.  We will make changes to your prescription with you as you progress through the program.   Duration: Be able to do 30 to 45 minutes of continuous aerobic exercise in addition to a 5 minute warm-up and a 5 minute cool-down routine.   Nutrition Goals: Your personal nutrition goals will be established when you do your nutrition analysis with the  dietician.  The following are general nutrition guidelines to follow: Cholesterol < '200mg'$ /day Sodium < '1500mg'$ /day Fiber: Men over 50 yrs - 30 grams per day  Personal Goals:  Personal Goals and Risk Factors at Admission - 09/17/22 1335       Core Components/Risk Factors/Patient Goals on Admission    Weight Management Yes    Intervention Weight Management: Develop a combined nutrition and exercise program designed to reach desired caloric intake, while maintaining appropriate intake of nutrient and fiber, sodium and fats, and appropriate energy expenditure required for the weight goal.;Weight Management: Provide education and appropriate resources to help participant work on and attain dietary goals.    Admit Weight 172 lb (78 kg)    Goal Weight: Short Term 170 lb (77.1 kg)    Goal Weight: Long Term 170 lb (77.1 kg)    Expected Outcomes Short Term: Continue to assess and modify interventions until short term weight is achieved;Long Term: Adherence to nutrition and physical activity/exercise program aimed toward attainment of established weight goal;Weight Maintenance: Understanding of the daily nutrition guidelines, which includes 25-35% calories from fat, 7% or less cal from saturated fats, less than '200mg'$  cholesterol, less than 1.5gm of sodium, & 5 or more servings of fruits and vegetables daily;Understanding  recommendations for meals to include 15-35% energy as protein, 25-35% energy from fat, 35-60% energy from carbohydrates, less than '200mg'$  of dietary cholesterol, 20-35 gm of total fiber daily;Understanding of distribution of calorie intake throughout the day with the consumption of 4-5 meals/snacks    Diabetes Yes    Intervention Provide education about signs/symptoms and action to take for hypo/hyperglycemia.;Provide education about proper nutrition, including hydration, and aerobic/resistive exercise prescription along with prescribed medications to achieve blood glucose in normal ranges:  Fasting glucose 65-99 mg/dL    Expected Outcomes Short Term: Participant verbalizes understanding of the signs/symptoms and immediate care of hyper/hypoglycemia, proper foot care and importance of medication, aerobic/resistive exercise and nutrition plan for blood glucose control.;Long Term: Attainment of HbA1C < 7%.    Hypertension Yes    Intervention Provide education on lifestyle modifcations including regular physical activity/exercise, weight management, moderate sodium restriction and increased consumption of fresh fruit, vegetables, and low fat dairy, alcohol moderation, and smoking cessation.;Monitor prescription use compliance.    Expected Outcomes Short Term: Continued assessment and intervention until BP is < 140/63m HG in hypertensive participants. < 130/840mHG in hypertensive participants with diabetes, heart failure or chronic kidney disease.;Long Term: Maintenance of blood pressure at goal levels.             Tobacco Use Initial Evaluation: Social History   Tobacco Use  Smoking Status Never  Smokeless Tobacco Never    Exercise Goals and Review:  Exercise Goals     Row Name 09/17/22 1252             Exercise Goals   Increase Physical Activity Yes       Intervention Provide advice, education, support and counseling about physical activity/exercise needs.;Develop an individualized exercise prescription for aerobic and resistive training based on initial evaluation findings, risk stratification, comorbidities and participant's personal goals.       Expected Outcomes Short Term: Attend rehab on a regular basis to increase amount of physical activity.;Long Term: Add in home exercise to make exercise part of routine and to increase amount of physical activity.;Long Term: Exercising regularly at least 3-5 days a week.       Increase Strength and Stamina Yes       Intervention Provide advice, education, support and counseling about physical activity/exercise needs.;Develop  an individualized exercise prescription for aerobic and resistive training based on initial evaluation findings, risk stratification, comorbidities and participant's personal goals.       Expected Outcomes Short Term: Increase workloads from initial exercise prescription for resistance, speed, and METs.;Short Term: Perform resistance training exercises routinely during rehab and add in resistance training at home;Long Term: Improve cardiorespiratory fitness, muscular endurance and strength as measured by increased METs and functional capacity (6MWT)       Able to understand and use rate of perceived exertion (RPE) scale Yes       Intervention Provide education and explanation on how to use RPE scale       Expected Outcomes Short Term: Able to use RPE daily in rehab to express subjective intensity level;Long Term:  Able to use RPE to guide intensity level when exercising independently       Able to understand and use Dyspnea scale Yes       Intervention Provide education and explanation on how to use Dyspnea scale       Expected Outcomes Short Term: Able to use Dyspnea scale daily in rehab to express subjective sense of shortness of breath during exertion;Long Term:  Able to use Dyspnea scale to guide intensity level when exercising independently       Knowledge and understanding of Target Heart Rate Range (THRR) Yes       Intervention Provide education and explanation of THRR including how the numbers were predicted and where they are located for reference       Expected Outcomes Short Term: Able to state/look up THRR;Long Term: Able to use THRR to govern intensity when exercising independently;Short Term: Able to use daily as guideline for intensity in rehab       Able to check pulse independently Yes       Intervention Provide education and demonstration on how to check pulse in carotid and radial arteries.;Review the importance of being able to check your own pulse for safety during independent  exercise       Expected Outcomes Short Term: Able to explain why pulse checking is important during independent exercise       Understanding of Exercise Prescription Yes       Intervention Provide education, explanation, and written materials on patient's individual exercise prescription       Expected Outcomes Short Term: Able to explain program exercise prescription;Long Term: Able to explain home exercise prescription to exercise independently                Copy of goals given to participant.

## 2022-09-18 ENCOUNTER — Encounter: Payer: PPO | Admitting: *Deleted

## 2022-09-18 DIAGNOSIS — Z955 Presence of coronary angioplasty implant and graft: Secondary | ICD-10-CM

## 2022-09-18 LAB — GLUCOSE, CAPILLARY
Glucose-Capillary: 98 mg/dL (ref 70–99)
Glucose-Capillary: 84 mg/dL (ref 70–99)

## 2022-09-18 NOTE — Progress Notes (Signed)
Daily Session Note  Patient Details  Name: Marcus Huang MRN: [id number] Date of Birth: [birth year] Referring Provider:   Flowsheet Row Cardiac Rehab from 09/17/2022 in [facility] Cardiac and Pulmonary Rehab  Referring Provider Paraschos/Fath       Encounter Date: 09/18/2022  Check In:  Session Check In - 09/18/22 0916       Check-In   Supervising physician immediately available to respond to emergencies See telemetry face sheet for immediately available ER MD    Location [facility]-Cardiac & Pulmonary Rehab    Staff Present Darlyne Russian, RN, Lorin Mercy, MS, ACSM CEP, Exercise Physiologist;Joseph Tessie Fass, [place]    Virtual Visit No    Medication changes reported     No    Fall or balance concerns reported    No    Warm-up and Cool-down Performed on first and last piece of equipment    Resistance Training Performed Yes    VAD Patient? No    PAD/SET Patient? No      Pain Assessment   Currently in Pain? No/denies                Social History   Tobacco Use  Smoking Status Never  Smokeless Tobacco Never    Goals Met:  Independence with exercise equipment Exercise tolerated well No report of concerns or symptoms today Strength training completed today  Goals Unmet:  Not Applicable  Comments: First full day of exercise!  Patient was oriented to gym and equipment including functions, settings, policies, and procedures.  Patient's individual exercise prescription and treatment plan were reviewed.  All starting workloads were established based on the results of the 6 minute walk test done at initial orientation visit.  The plan for exercise progression was also introduced and progression will be customized based on patient's performance and goals.    Dr. Emily Filbert is Medical Director for [place].  Dr. Ottie Glazier is Medical Director for [facility] Pulmonary Rehabilitation.

## 2022-09-23 ENCOUNTER — Encounter: Payer: PPO | Admitting: *Deleted

## 2022-09-23 DIAGNOSIS — Z955 Presence of coronary angioplasty implant and graft: Secondary | ICD-10-CM | POA: Diagnosis not present

## 2022-09-23 LAB — GLUCOSE, CAPILLARY
Glucose-Capillary: 97 mg/dL (ref 70–99)
Glucose-Capillary: 90 mg/dL (ref 70–99)

## 2022-09-23 NOTE — Progress Notes (Signed)
Daily Session Note  Patient Details  Name: Marcus Huang MRN: [id number] Date of Birth: 1951/01/01 Referring Provider:   Flowsheet Row Cardiac Rehab from 09/17/2022 in [facility] Cardiac and Pulmonary Rehab  Referring Provider Paraschos/Fath       Encounter Date: 09/23/2022  Check In:  Session Check In - 09/23/22 0830       Check-In   Supervising physician immediately available to respond to emergencies See telemetry face sheet for immediately available ER MD    Location [facility]-Cardiac & Pulmonary Rehab    Staff Present Heath Lark, RN, BSN, CCRP;Jessica Wayne, MA, RCEP, CCRP, Bertram Gala, MS, ACSM CEP, Exercise Physiologist    Virtual Visit No    Medication changes reported     No    Fall or balance concerns reported    No    Warm-up and Cool-down Performed on first and last piece of equipment    Resistance Training Performed Yes    VAD Patient? No    PAD/SET Patient? No      Pain Assessment   Currently in Pain? No/denies                Social History   Tobacco Use  Smoking Status Never  Smokeless Tobacco Never    Goals Met:  Independence with exercise equipment Exercise tolerated well No report of concerns or symptoms today  Goals Unmet:  Not Applicable  Comments: Pt able to follow exercise prescription today without complaint.  Will continue to monitor for progression.    Dr. Emily Filbert is Medical Director for [place].  Dr. Ottie Glazier is Medical Director for [facility] Pulmonary Rehabilitation.

## 2022-09-25 ENCOUNTER — Ambulatory Visit
Admission: RE | Admit: 2022-09-25 | Discharge: 2022-09-25 | Disposition: A | Discharge status: Home / Self Care | Payer: PPO | Source: Ambulatory Visit | Attending: Urology | Admitting: Urology

## 2022-09-25 DIAGNOSIS — M47816 Spondylosis without myelopathy or radiculopathy, lumbar region: Secondary | ICD-10-CM | POA: Diagnosis not present

## 2022-09-25 DIAGNOSIS — K5710 Diverticulosis of small intestine without perforation or abscess without bleeding: Secondary | ICD-10-CM | POA: Diagnosis not present

## 2022-09-25 DIAGNOSIS — N281 Cyst of kidney, acquired: Secondary | ICD-10-CM | POA: Diagnosis not present

## 2022-09-25 MED ORDER — GADOBUTROL 1 MMOL/ML IV SOLN
7.5000 mL | Freq: Once | INTRAVENOUS | Status: AC | PRN
  Administered 2022-09-25: 7.5 mL via INTRAVENOUS

## 2022-09-26 ENCOUNTER — Encounter: Payer: Self-pay | Admitting: *Deleted

## 2022-09-30 ENCOUNTER — Encounter: Payer: PPO | Admitting: *Deleted

## 2022-09-30 DIAGNOSIS — Z955 Presence of coronary angioplasty implant and graft: Secondary | ICD-10-CM | POA: Diagnosis not present

## 2022-09-30 LAB — GLUCOSE, CAPILLARY
Glucose-Capillary: 108 mg/dL — ABNORMAL HIGH (ref 70–99)
Glucose-Capillary: 100 mg/dL — ABNORMAL HIGH (ref 70–99)

## 2022-09-30 NOTE — Progress Notes (Signed)
Daily Session Note  Patient Details  Name: Marcus Huang MRN: [id number] Date of Birth: Apr 21, 1951 Referring Provider:   Flowsheet Row Cardiac Rehab from 09/17/2022 in [facility] Cardiac and Pulmonary Rehab  Referring Provider Paraschos/Fath       Encounter Date: 09/30/2022  Check In:  Session Check In - 09/30/22 1013       Check-In   Supervising physician immediately available to respond to emergencies See telemetry face sheet for immediately available ER MD    Location [facility]-Cardiac & Pulmonary Rehab    Staff Present Heath Lark, RN, BSN, Kathaleen Maser, MS, ACSM CEP, Exercise Physiologist;Jessica Luan Pulling, MA, RCEP, CCRP, CCET;Noah Tickle, BS, Exercise Physiologist    Virtual Visit No    Medication changes reported     No    Fall or balance concerns reported    No    Warm-up and Cool-down Performed on first and last piece of equipment    Resistance Training Performed Yes    VAD Patient? No    PAD/SET Patient? No      Pain Assessment   Currently in Pain? No/denies               Exercise Prescription Changes - 09/30/22 0800       Home Exercise Plan   Plans to continue exercise at Home (comment)   walking at home   Frequency Add 2 additional days to program exercise sessions.    Initial Home Exercises Provided 09/30/22             Social History   Tobacco Use  Smoking Status Never  Smokeless Tobacco Never    Goals Met:  Independence with exercise equipment Exercise tolerated well No report of concerns or symptoms today  Goals Unmet:  Not Applicable  Comments: Pt able to follow exercise prescription today without complaint.  Will continue to monitor for progression.    Dr. Emily Filbert is Medical Director for [place].  Dr. Ottie Glazier is Medical Director for [facility] Pulmonary Rehabilitation.

## 2022-10-02 ENCOUNTER — Encounter: Payer: PPO | Admitting: *Deleted

## 2022-10-02 DIAGNOSIS — Z955 Presence of coronary angioplasty implant and graft: Secondary | ICD-10-CM | POA: Diagnosis not present

## 2022-10-02 NOTE — Progress Notes (Signed)
Daily Session Note  Patient Details  Name: Marcus Huang MRN: [id number] Date of Birth: [birth year] Referring Provider:   Flowsheet Row Cardiac Rehab from 09/17/2022 in [facility] Cardiac and Pulmonary Rehab  Referring Provider Paraschos/Fath       Encounter Date: 10/02/2022  Check In:  Session Check In - 10/02/22 0806       Check-In   Supervising physician immediately available to respond to emergencies See telemetry face sheet for immediately available ER MD    Location [facility]-Cardiac & Pulmonary Rehab    Staff Present Darlyne Russian, RN, Lorin Mercy, MS, ACSM CEP, Exercise Physiologist;Joseph Tessie Fass, [place]    Virtual Visit No    Medication changes reported     No    Fall or balance concerns reported    No    Warm-up and Cool-down Performed on first and last piece of equipment    Resistance Training Performed Yes    VAD Patient? No    PAD/SET Patient? No      Pain Assessment   Currently in Pain? No/denies                Social History   Tobacco Use  Smoking Status Never  Smokeless Tobacco Never    Goals Met:  Independence with exercise equipment Exercise tolerated well No report of concerns or symptoms today Strength training completed today  Goals Unmet:  Not Applicable  Comments: Pt able to follow exercise prescription today without complaint.  Will continue to monitor for progression.    Dr. Emily Filbert is Medical Director for [place].  Dr. Ottie Glazier is Medical Director for [facility] Pulmonary Rehabilitation.

## 2022-10-07 ENCOUNTER — Encounter: Payer: PPO | Admitting: *Deleted

## 2022-10-07 DIAGNOSIS — Z955 Presence of coronary angioplasty implant and graft: Secondary | ICD-10-CM

## 2022-10-07 NOTE — Progress Notes (Signed)
Daily Session Note  Patient Details  Name: Marcus Huang MRN: [id number] Date of Birth: 1951/06/06 Referring Provider:   Flowsheet Row Cardiac Rehab from 09/17/2022 in [facility] Cardiac and Pulmonary Rehab  Referring Provider Paraschos/Fath       Encounter Date: 10/07/2022  Check In:  Session Check In - 10/07/22 0913       Check-In   Supervising physician immediately available to respond to emergencies See telemetry face sheet for immediately available ER MD    Location [facility]-Cardiac & Pulmonary Rehab    Staff Present Heath Lark, RN, BSN, Kathaleen Maser, MS, ACSM CEP, Exercise Physiologist;Laureen Owens Shark, BS, RRT, CPFT    Virtual Visit No    Medication changes reported     No    Fall or balance concerns reported    No    Warm-up and Cool-down Performed on first and last piece of equipment    Resistance Training Performed Yes    VAD Patient? No    PAD/SET Patient? No      Pain Assessment   Currently in Pain? No/denies                Social History   Tobacco Use  Smoking Status Never  Smokeless Tobacco Never    Goals Met:  Independence with exercise equipment Exercise tolerated well No report of concerns or symptoms today  Goals Unmet:  Not Applicable  Comments: Pt able to follow exercise prescription today without complaint.  Will continue to monitor for progression.    Dr. Emily Filbert is Medical Director for [place].  Dr. Ottie Glazier is Medical Director for [facility] Pulmonary Rehabilitation.

## 2022-10-08 ENCOUNTER — Encounter: Payer: Self-pay | Admitting: *Deleted

## 2022-10-08 DIAGNOSIS — Z955 Presence of coronary angioplasty implant and graft: Secondary | ICD-10-CM

## 2022-10-08 NOTE — Progress Notes (Signed)
Cardiac Individual Treatment Plan  Patient Details  Name: Marcus Huang MRN: [id number] Date of Birth: [birth year] Referring Provider:   Flowsheet Row Cardiac Rehab from 09/17/2022 in [facility] Cardiac and Pulmonary Rehab  Referring Provider Paraschos/Fath       Initial Encounter Date:  Flowsheet Row Cardiac Rehab from 09/17/2022 in [facility] Cardiac and Pulmonary Rehab  Date 09/17/22       Visit Diagnosis: Status post coronary artery stent placement  Patient's Home Medications on Admission:  Current Outpatient Medications:    amLODipine (NORVASC) 2.5 MG tablet, Take 1 tablet (2.5 mg total) by mouth in the morning and at bedtime., Disp: 60 tablet, Rfl: 11   aspirin EC 81 MG tablet, Take 1 tablet (81 mg total) by mouth daily at 12 noon., Disp: 30 tablet, Rfl: 2   atorvastatin (LIPITOR) 80 MG tablet, Take 1 tablet (80 mg total) by mouth at bedtime., Disp: 30 tablet, Rfl: 11   azelastine (ASTELIN) 0.1 % nasal spray, Place into the nose. PRN, Disp: , Rfl:    clopidogrel (PLAVIX) 75 MG tablet, Take 1 tablet (75 mg total) by mouth daily., Disp: 30 tablet, Rfl: 11   hydrochlorothiazide (HYDRODIURIL) 25 MG tablet, Take 1 tablet (25 mg total) by mouth daily., Disp: 30 tablet, Rfl: 11   metoprolol tartrate (LOPRESSOR) 25 MG tablet, Take 0.5 tablets (12.5 mg total) by mouth 2 (two) times daily., Disp: 30 tablet, Rfl: 11   Multiple Vitamin (MULTIVITAMIN) tablet, Take 1 tablet by mouth daily., Disp: , Rfl:    pioglitazone (ACTOS) 30 MG tablet, Take 30 mg by mouth daily., Disp: , Rfl:    PREVIDENT 5000 SENSITIVE 1.1-5 % GEL, Place onto teeth as directed., Disp: , Rfl:    traMADol (ULTRAM) 50 MG tablet, TAKE 1 TABLET(50 MG) BY MOUTH EVERY 6 HOURS AS NEEDED FOR PAIN, Disp: , Rfl:    VITAMIN D PO, Take 5,000 Units by mouth daily., Disp: , Rfl:   Past Medical History: Past Medical History:  Diagnosis Date   Carotid artery thrombosis    Left Carotid Artery   Coronary artery disease    Diabetes (HCC)     Diverticulosis    GERD (gastroesophageal reflux disease)    History of kidney stones    Hyperlipidemia    Hypertension    Renal mass    Sleep apnea    C-PAP    Tobacco Use: Social History   Tobacco Use  Smoking Status Never  Smokeless Tobacco Never    Labs: Review Flowsheet        No data to display           Exercise Target Goals: Exercise Program Goal: Individual exercise prescription set using results from initial 6 min walk test and THRR while considering  patient's activity barriers and safety.   Exercise Prescription Goal: Initial exercise prescription builds to 30-45 minutes a day of aerobic activity, 2-3 days per week.  Home exercise guidelines will be given to patient during program as part of exercise prescription that the participant will acknowledge.   Education: Aerobic Exercise: - Group verbal and visual presentation on the components of exercise prescription. Introduces F.I.T.T principle from ACSM for exercise prescriptions.  Reviews F.I.T.T. principles of aerobic exercise including progression. Written material given at graduation.   Education: Resistance Exercise: - Group verbal and visual presentation on the components of exercise prescription. Introduces F.I.T.T principle from ACSM for exercise prescriptions  Reviews F.I.T.T. principles of resistance exercise including progression. Written material given at graduation.  Education: Exercise & Equipment Safety: - Individual verbal instruction and demonstration of equipment use and safety with use of the equipment. Flowsheet Row Cardiac Rehab from 09/17/2022 in [facility] Cardiac and Pulmonary Rehab  Date 09/17/22  Educator [facility]  Instruction Review Code 1- Verbalizes Understanding       Education: Exercise Physiology & General Exercise Guidelines: - Group verbal and written instruction with models to review the exercise physiology of the cardiovascular system and associated critical values. Provides  general exercise guidelines with specific guidelines to those with heart or lung disease.    Education: Flexibility, Balance, Mind/Body Relaxation: - Group verbal and visual presentation with interactive activity on the components of exercise prescription. Introduces F.I.T.T principle from ACSM for exercise prescriptions. Reviews F.I.T.T. principles of flexibility and balance exercise training including progression. Also discusses the mind body connection.  Reviews various relaxation techniques to help reduce and manage stress (i.e. Deep breathing, progressive muscle relaxation, and visualization). Balance handout provided to take home. Written material given at graduation.   Activity Barriers & Risk Stratification:  Activity Barriers & Cardiac Risk Stratification - 09/17/22 1248       Activity Barriers & Cardiac Risk Stratification   Activity Barriers None    Cardiac Risk Stratification Moderate             6 Minute Walk:  6 Minute Walk     Row Name 09/17/22 1246         6 Minute Walk   Phase Initial     Distance 1300 feet     Walk Time 6 minutes     # of Rest Breaks 0     MPH 2.46     METS 3.14     RPE 11     Perceived Dyspnea  0     VO2 Peak 10.98     Symptoms No     Resting HR 62 bpm     Resting BP 148/82     Resting Oxygen Saturation  99 %     Exercise Oxygen Saturation  during 6 min walk 98 %     Max Ex. HR 103 bpm     Max Ex. BP 138/62     2 Minute Post BP 124/72              Oxygen Initial Assessment:   Oxygen Re-Evaluation:   Oxygen Discharge (Final Oxygen Re-Evaluation):   Initial Exercise Prescription:  Initial Exercise Prescription - 09/17/22 1200       Date of Initial Exercise RX and Referring Provider   Date 09/17/22    Referring Provider Paraschos/Fath      Oxygen   Maintain Oxygen Saturation 88% or higher      Treadmill   MPH 2.5    Grade 1    Minutes 15    METs 3.26      Elliptical   Level 1    Speed 3    Minutes 1     METs 3.14      Prescription Details   Frequency (times per week) 2    Duration Progress to 30 minutes of continuous aerobic without signs/symptoms of physical distress      Intensity   THRR 40-80% of Max Heartrate 97-133    Ratings of Perceived Exertion 11-13    Perceived Dyspnea 0-4      Progression   Progression Continue to progress workloads to maintain intensity without signs/symptoms of physical distress.      Resistance Training  Training Prescription Yes    Weight 5    Reps 10-15             Perform Capillary Blood Glucose checks as needed.  Exercise Prescription Changes:   Exercise Prescription Changes     Row Name 09/17/22 1200 09/29/22 0900 09/30/22 0800         Response to Exercise   Blood Pressure (Admit) 148/82 130/64 --     Blood Pressure (Exercise) 138/62 154/64 --     Blood Pressure (Exit) 124/72 124/64 --     Heart Rate (Admit) 62 bpm 70 bpm --     Heart Rate (Exercise) 103 bpm 103 bpm --     Heart Rate (Exit) 64 bpm 66 bpm --     Oxygen Saturation (Admit) 99 % -- --     Oxygen Saturation (Exercise) 98 % -- --     Oxygen Saturation (Exit) 98 % -- --     Rating of Perceived Exertion (Exercise) 11 13 --     Perceived Dyspnea (Exercise) 0 -- --     Symptoms none none --     Comments 6 MWT results 2nd full day of exercise --     Duration -- Continue with 30 min of aerobic exercise without signs/symptoms of physical distress. --     Intensity -- THRR unchanged --       Progression   Progression -- Continue to progress workloads to maintain intensity without signs/symptoms of physical distress. --     Average METs -- 3.21 --       Resistance Training   Training Prescription -- Yes --     Weight -- 5 lb --     Reps -- 10-15 --       Interval Training   Interval Training -- No --       Treadmill   MPH -- 3.1 --     Grade -- 3 --     Minutes -- 15 --     METs -- 4.66 --       Recumbant Elliptical   Level -- 2.3 --     Minutes -- 15 --      METs -- 1.9 --       Elliptical   Level -- 1 --     Speed -- 2.9 --     Minutes -- 15 --     METs -- 2.7 --       Home Exercise Plan   Plans to continue exercise at -- -- Home (comment)  walking at home     Frequency -- -- Add 2 additional days to program exercise sessions.     Initial Home Exercises Provided -- -- 09/30/22       Oxygen   Maintain Oxygen Saturation -- 88% or higher --              Exercise Comments:   Exercise Comments     Row Name 09/18/22 5427           Exercise Comments First full day of exercise!  Patient was oriented to gym and equipment including functions, settings, policies, and procedures.  Patient's individual exercise prescription and treatment plan were reviewed.  All starting workloads were established based on the results of the 6 minute walk test done at initial orientation visit.  The plan for exercise progression was also introduced and progression will be customized based on patient's performance and goals.  Exercise Goals and Review:   Exercise Goals     Row Name 09/17/22 1252             Exercise Goals   Increase Physical Activity Yes       Intervention Provide advice, education, support and counseling about physical activity/exercise needs.;Develop an individualized exercise prescription for aerobic and resistive training based on initial evaluation findings, risk stratification, comorbidities and participant's personal goals.       Expected Outcomes Short Term: Attend rehab on a regular basis to increase amount of physical activity.;Long Term: Add in home exercise to make exercise part of routine and to increase amount of physical activity.;Long Term: Exercising regularly at least 3-5 days a week.       Increase Strength and Stamina Yes       Intervention Provide advice, education, support and counseling about physical activity/exercise needs.;Develop an individualized exercise prescription for aerobic and  resistive training based on initial evaluation findings, risk stratification, comorbidities and participant's personal goals.       Expected Outcomes Short Term: Increase workloads from initial exercise prescription for resistance, speed, and METs.;Short Term: Perform resistance training exercises routinely during rehab and add in resistance training at home;Long Term: Improve cardiorespiratory fitness, muscular endurance and strength as measured by increased METs and functional capacity (6MWT)       Able to understand and use rate of perceived exertion (RPE) scale Yes       Intervention Provide education and explanation on how to use RPE scale       Expected Outcomes Short Term: Able to use RPE daily in rehab to express subjective intensity level;Long Term:  Able to use RPE to guide intensity level when exercising independently       Able to understand and use Dyspnea scale Yes       Intervention Provide education and explanation on how to use Dyspnea scale       Expected Outcomes Short Term: Able to use Dyspnea scale daily in rehab to express subjective sense of shortness of breath during exertion;Long Term: Able to use Dyspnea scale to guide intensity level when exercising independently       Knowledge and understanding of Target Heart Rate Range (THRR) Yes       Intervention Provide education and explanation of THRR including how the numbers were predicted and where they are located for reference       Expected Outcomes Short Term: Able to state/look up THRR;Long Term: Able to use THRR to govern intensity when exercising independently;Short Term: Able to use daily as guideline for intensity in rehab       Able to check pulse independently Yes       Intervention Provide education and demonstration on how to check pulse in carotid and radial arteries.;Review the importance of being able to check your own pulse for safety during independent exercise       Expected Outcomes Short Term: Able to explain  why pulse checking is important during independent exercise       Understanding of Exercise Prescription Yes       Intervention Provide education, explanation, and written materials on patient's individual exercise prescription       Expected Outcomes Short Term: Able to explain program exercise prescription;Long Term: Able to explain home exercise prescription to exercise independently                Exercise Goals Re-Evaluation :  Exercise Goals Re-Evaluation     Row  Name 09/18/22 4098 09/29/22 0934 09/30/22 0813         Exercise Goal Re-Evaluation   Exercise Goals Review Able to understand and use rate of perceived exertion (RPE) scale;Able to understand and use Dyspnea scale;Knowledge and understanding of Target Heart Rate Range (THRR);Understanding of Exercise Prescription Increase Physical Activity;Increase Strength and Stamina;Understanding of Exercise Prescription Increase Physical Activity;Increase Strength and Stamina;Understanding of Exercise Prescription;Able to understand and use Dyspnea scale;Able to check pulse independently;Knowledge and understanding of Target Heart Rate Range (THRR);Able to understand and use rate of perceived exertion (RPE) scale     Comments Reviewed RPE scale, THR and program prescription with pt today.  Pt voiced understanding and was given a copy of goals to take home. Turki is off to a good start with rehab for the first couple of sessions he has been here . He was able to exercise on the elliptical well at about a 3 mph speed. He also increased his workload on the treadmill to a 3.1 mph/ 3% already. RPEs are at an appropriate level. Will continue to monitor as he progresses in the program. Zaydyn is on day three thus far for rehab. He is doing well with his workloads and starting to bring them up slowly.  He tried out the elliptical for the first time today.  Reviewed home exercise with pt today.  Pt plans to walk at home for exercise.  Reviewed THR, pulse,  RPE, sign and symptoms, pulse oximetery and when to call 911 or MD.  Also discussed weather considerations and indoor options.  Pt voiced understanding.     Expected Outcomes Short: Use RPE daily to regulate intensity. Long: Follow program prescription in THR. Short: Continue to work up endurance on elliptical Long: Continue to increase overall MET level and stamina Short: Start to add in more walking at home to build back up.   long: Continue to attend rehab regularly              Discharge Exercise Prescription (Final Exercise Prescription Changes):  Exercise Prescription Changes - 09/30/22 0800       Home Exercise Plan   Plans to continue exercise at Home (comment)   walking at home   Frequency Add 2 additional days to program exercise sessions.    Initial Home Exercises Provided 09/30/22             Nutrition:  Target Goals: Understanding of nutrition guidelines, daily intake of sodium '1500mg'$ , cholesterol '200mg'$ , calories 30% from fat and 7% or less from saturated fats, daily to have 5 or more servings of fruits and vegetables.  Education: All About Nutrition: -Group instruction provided by verbal, written material, interactive activities, discussions, models, and posters to present general guidelines for heart healthy nutrition including fat, fiber, MyPlate, the role of sodium in heart healthy nutrition, utilization of the nutrition label, and utilization of this knowledge for meal planning. Follow up email sent as well. Written material given at graduation. Flowsheet Row Cardiac Rehab from 09/17/2022 in [facility] Cardiac and Pulmonary Rehab  Education need identified 09/17/22       Biometrics:  Pre Biometrics - 09/18/22 0804       Pre Biometrics   Single Leg Stand 23.06 seconds              Nutrition Therapy Plan and Nutrition Goals:  Nutrition Therapy & Goals - 09/17/22 1335       Intervention Plan   Intervention Prescribe, educate and counsel regarding  individualized specific dietary  modifications aiming towards targeted core components such as weight, hypertension, lipid management, diabetes, heart failure and other comorbidities.;Nutrition handout(s) given to patient.    Expected Outcomes Short Term Goal: Understand basic principles of dietary content, such as calories, fat, sodium, cholesterol and nutrients.             Nutrition Assessments:  MEDIFICTS Score Key: ?70 Need to make dietary changes  40-70 Heart Healthy Diet ? 40 Therapeutic Level Cholesterol Diet  Flowsheet Row Cardiac Rehab from 09/17/2022 in [facility] Cardiac and Pulmonary Rehab  Picture Your Plate Total Score on Admission 65      Picture Your Plate Scores: <58 Unhealthy dietary pattern with much room for improvement. 41-50 Dietary pattern unlikely to meet recommendations for good health and room for improvement. 51-60 More healthful dietary pattern, with some room for improvement.  >60 Healthy dietary pattern, although there may be some specific behaviors that could be improved.    Nutrition Goals Re-Evaluation:  Nutrition Goals Re-Evaluation     [place] Name 09/30/22 [phone]             Goals   Nutrition Goal Meet with dietitian.  Make heart healthy changes       Comment Sufyaan has already started to implement some changes with his diet.  He has been working on getting his A1c down and his at home test has it at 5.5 now versus 8 4 months ago.  He is aiming for low carbs/keto diet.  He keeps a close eye on his sugars.  He is eating a lot of vegetables and greens as well.  He does not eat as much fruit with his diabetes.       Expected Outcome Short; Continue to aim for balance in diet Long: Continue to add variety                Nutrition Goals Discharge (Final Nutrition Goals Re-Evaluation):  Nutrition Goals Re-Evaluation - 09/30/22 0819       Goals   Nutrition Goal Meet with dietitian.  Make heart healthy changes    Comment Dyami has already started to  implement some changes with his diet.  He has been working on getting his A1c down and his at home test has it at 5.5 now versus 8 4 months ago.  He is aiming for low carbs/keto diet.  He keeps a close eye on his sugars.  He is eating a lot of vegetables and greens as well.  He does not eat as much fruit with his diabetes.    Expected Outcome Short; Continue to aim for balance in diet Long: Continue to add variety             Psychosocial: Target Goals: Acknowledge presence or absence of significant depression and/or stress, maximize coping skills, provide positive support system. Participant is able to verbalize types and ability to use techniques and skills needed for reducing stress and depression.   Education: Stress, Anxiety, and Depression - Group verbal and visual presentation to define topics covered.  Reviews how body is impacted by stress, anxiety, and depression.  Also discusses healthy ways to reduce stress and to treat/manage anxiety and depression.  Written material given at graduation.   Education: Sleep Hygiene -Provides group verbal and written instruction about how sleep can affect your health.  Define sleep hygiene, discuss sleep cycles and impact of sleep habits. Review good sleep hygiene tips.    Initial Review & Psychosocial Screening:  Initial Psych Review & Screening -  09/09/22 1307       Initial Review   Current issues with None Identified      Family Dynamics   Good Support System? Yes   wife, daughter, friends     Barriers   Psychosocial barriers to participate in program There are no identifiable barriers or psychosocial needs.;The patient should benefit from training in stress management and relaxation.      Screening Interventions   Interventions Encouraged to exercise;Provide feedback about the scores to participant;To provide support and resources with identified psychosocial needs    Expected Outcomes Short Term goal: Utilizing psychosocial  counselor, staff and physician to assist with identification of specific Stressors or current issues interfering with healing process. Setting desired goal for each stressor or current issue identified.;Long Term Goal: Stressors or current issues are controlled or eliminated.;Long Term goal: The participant improves quality of Life and PHQ9 Scores as seen by post scores and/or verbalization of changes;Short Term goal: Identification and review with participant of any Quality of Life or Depression concerns found by scoring the questionnaire.             Quality of Life Scores:   Quality of Life - 09/17/22 1332       Quality of Life   Select Quality of Life      Quality of Life Scores   Health/Function Pre 26.1 %    Socioeconomic Pre 24.38 %    Psych/Spiritual Pre 23.93 %    Family Pre 27.3 %    GLOBAL Pre 25.44 %            Scores of 19 and below usually indicate a poorer quality of life in these areas.  A difference of  2-3 points is a clinically meaningful difference.  A difference of 2-3 points in the total score of the Quality of Life Index has been associated with significant improvement in overall quality of life, self-image, physical symptoms, and general health in studies assessing change in quality of life.  PHQ-9: Review Flowsheet       09/17/2022  Depression screen PHQ 2/9  Decreased Interest 0  Down, Depressed, Hopeless 1  PHQ - 2 Score 1  Altered sleeping 3  Tired, decreased energy 1  Change in appetite 0  Feeling bad or failure about yourself  1  Trouble concentrating 0  Moving slowly or fidgety/restless 1  Suicidal thoughts 0  PHQ-9 Score 7  Difficult doing work/chores Not difficult at all   Interpretation of Total Score  Total Score Depression Severity:  1-4 = Minimal depression, 5-9 = Mild depression, 10-14 = Moderate depression, 15-19 = Moderately severe depression, 20-27 = Severe depression   Psychosocial Evaluation and Intervention:  Psychosocial  Evaluation - 09/09/22 1319       Psychosocial Evaluation & Interventions   Interventions Encouraged to exercise with the program and follow exercise prescription    Comments Neo is coming to cardiac rehab post stent. He is very motivated to get back to his exercise routine and continue on his journey of a heart healthy lifestyle. He has worked hard to manage his diabetes and make healthy changes. He states he has a wonderful support system of his wife, daughter, and friends from his prayer group. He reports no stress concerns. When asked about sleep, he mentioned he has had a CPAP for 2 years and recently has stopped using it after weight loss and the stent. He is going to follow up with his doctor soon to see if he  still needs it. He is ready to start the program to learn more    Expected Outcomes Short: attend cardiac rehab for education and exercise. Long: develop and maintain positive self care habits.    Continue Psychosocial Services  Follow up required by staff             Psychosocial Re-Evaluation:  Psychosocial Re-Evaluation     [place] Name 09/30/22 [phone]             Psychosocial Re-Evaluation   Current issues with Current Stress Concerns;Current Sleep Concerns       Comments Donnavan is off to a good start in rehab.  His health is his biggest stressor especially his heart. He has been having some pulls and strong beats in the morning and hearing his heart at night.  He is not a good sleeper in general as he wakes multiple times a night but is usually able to get back to sleep.  He usually get between 7-8 hrs of sleep each day.       Expected Outcomes Short: Attend rehab to learn more about his heart Long: Conitnue to cope with heart and learn new normal       Interventions Stress management education;Encouraged to attend Cardiac Rehabilitation for the exercise       Continue Psychosocial Services  Follow up required by staff                Psychosocial Discharge (Final  Psychosocial Re-Evaluation):  Psychosocial Re-Evaluation - 09/30/22 0814       Psychosocial Re-Evaluation   Current issues with Current Stress Concerns;Current Sleep Concerns    Comments Rece is off to a good start in rehab.  His health is his biggest stressor especially his heart. He has been having some pulls and strong beats in the morning and hearing his heart at night.  He is not a good sleeper in general as he wakes multiple times a night but is usually able to get back to sleep.  He usually get between 7-8 hrs of sleep each day.    Expected Outcomes Short: Attend rehab to learn more about his heart Long: Conitnue to cope with heart and learn new normal    Interventions Stress management education;Encouraged to attend Cardiac Rehabilitation for the exercise    Continue Psychosocial Services  Follow up required by staff             Vocational Rehabilitation: Provide vocational rehab assistance to qualifying candidates.   Vocational Rehab Evaluation & Intervention:  Vocational Rehab - 09/09/22 1307       Initial Vocational Rehab Evaluation & Intervention   Assessment shows need for Vocational Rehabilitation No             Education: Education Goals: Education classes will be provided on a variety of topics geared toward better understanding of heart health and risk factor modification. Participant will state understanding/return demonstration of topics presented as noted by education test scores.  Learning Barriers/Preferences:  Learning Barriers/Preferences - 09/09/22 1307       Learning Barriers/Preferences   Learning Barriers None    Learning Preferences None             General Cardiac Education Topics:  AED/CPR: - Group verbal and written instruction with the use of models to demonstrate the basic use of the AED with the basic ABC's of resuscitation.   Anatomy and Cardiac Procedures: - Group verbal and visual presentation and models provide  information about  basic cardiac anatomy and function. Reviews the testing methods done to diagnose heart disease and the outcomes of the test results. Describes the treatment choices: Medical Management, Angioplasty, or Coronary Bypass Surgery for treating various heart conditions including Myocardial Infarction, Angina, Valve Disease, and Cardiac Arrhythmias.  Written material given at graduation. Flowsheet Row Cardiac Rehab from 09/17/2022 in [facility] Cardiac and Pulmonary Rehab  Education need identified 09/17/22       Medication Safety: - Group verbal and visual instruction to review commonly prescribed medications for heart and lung disease. Reviews the medication, class of the drug, and side effects. Includes the steps to properly store meds and maintain the prescription regimen.  Written material given at graduation.   Intimacy: - Group verbal instruction through game format to discuss how heart and lung disease can affect sexual intimacy. Written material given at graduation..   Know Your Numbers and Heart Failure: - Group verbal and visual instruction to discuss disease risk factors for cardiac and pulmonary disease and treatment options.  Reviews associated critical values for Overweight/Obesity, Hypertension, Cholesterol, and Diabetes.  Discusses basics of heart failure: signs/symptoms and treatments.  Introduces Heart Failure Zone chart for action plan for heart failure.  Written material given at graduation.   Infection Prevention: - Provides verbal and written material to individual with discussion of infection control including proper hand washing and proper equipment cleaning during exercise session. Flowsheet Row Cardiac Rehab from 09/17/2022 in [facility] Cardiac and Pulmonary Rehab  Date 09/17/22  Educator [employer]  Instruction Review Code 1- Verbalizes Understanding       Falls Prevention: - Provides verbal and written material to individual with discussion of falls prevention and  safety. Flowsheet Row Cardiac Rehab from 09/17/2022 in [facility] Cardiac and Pulmonary Rehab  Date 09/17/22  Educator [facility]  Instruction Review Code 1- Verbalizes Understanding       Other: -Provides group and verbal instruction on various topics (see comments)   Knowledge Questionnaire Score:  Knowledge Questionnaire Score - 09/17/22 1332       Knowledge Questionnaire Score   Pre Score 23/26             Core Components/Risk Factors/Patient Goals at Admission:  Personal Goals and Risk Factors at Admission - 09/17/22 1335       Core Components/Risk Factors/Patient Goals on Admission    Weight Management Yes    Intervention Weight Management: Develop a combined nutrition and exercise program designed to reach desired caloric intake, while maintaining appropriate intake of nutrient and fiber, sodium and fats, and appropriate energy expenditure required for the weight goal.;Weight Management: Provide education and appropriate resources to help participant work on and attain dietary goals.    Admit Weight 172 lb (78 kg)    Goal Weight: Short Term 170 lb (77.1 kg)    Goal Weight: Long Term 170 lb (77.1 kg)    Expected Outcomes Short Term: Continue to assess and modify interventions until short term weight is achieved;Long Term: Adherence to nutrition and physical activity/exercise program aimed toward attainment of established weight goal;Weight Maintenance: Understanding of the daily nutrition guidelines, which includes 25-35% calories from fat, 7% or less cal from saturated fats, less than '200mg'$  cholesterol, less than 1.5gm of sodium, & 5 or more servings of fruits and vegetables daily;Understanding recommendations for meals to include 15-35% energy as protein, 25-35% energy from fat, 35-60% energy from carbohydrates, less than '200mg'$  of dietary cholesterol, 20-35 gm of total fiber daily;Understanding of distribution of calorie intake throughout the day with  the consumption of 4-5 meals/snacks     Diabetes Yes    Intervention Provide education about signs/symptoms and action to take for hypo/hyperglycemia.;Provide education about proper nutrition, including hydration, and aerobic/resistive exercise prescription along with prescribed medications to achieve blood glucose in normal ranges: Fasting glucose 65-99 mg/dL    Expected Outcomes Short Term: Participant verbalizes understanding of the signs/symptoms and immediate care of hyper/hypoglycemia, proper foot care and importance of medication, aerobic/resistive exercise and nutrition plan for blood glucose control.;Long Term: Attainment of HbA1C < 7%.    Hypertension Yes    Intervention Provide education on lifestyle modifcations including regular physical activity/exercise, weight management, moderate sodium restriction and increased consumption of fresh fruit, vegetables, and low fat dairy, alcohol moderation, and smoking cessation.;Monitor prescription use compliance.    Expected Outcomes Short Term: Continued assessment and intervention until BP is < 140/57m HG in hypertensive participants. < 130/859mHG in hypertensive participants with diabetes, heart failure or chronic kidney disease.;Long Term: Maintenance of blood pressure at goal levels.             Education:Diabetes - Individual verbal and written instruction to review signs/symptoms of diabetes, desired ranges of glucose level fasting, after meals and with exercise. Acknowledge that pre and post exercise glucose checks will be done for 3 sessions at entry of program. [facility]  Date 09/17/22  [facility] 1- Verbalizes Understanding       Core Components/Risk Factors/Patient Goals Review:   Goals and Risk Factor Review     Row Name 09/30/22 0821             Core Components/Risk Factors/Patient Goals Review   Personal Goals Review Weight  Management/Obesity;Hypertension;Diabetes       Review GaSelestinos doing well in rehab thus far.  He would like to lose a little more weight, but recently it has been holding steady. He is eating a keto/low carb diet with his diabetes.  His at home test has his A1c down to 5.5.  He is happy with where he is but would still like to lose more in his belly.  His pressures are doing well in class.  They are at the lowest that he has every had.  They have ranged between 130-110s for most part on top an in the 60s on the bottom. He does note that he feels tired all the time and does not have any energy.  He was encouraged to talk to doctor.       Expected Outcomes Short: Continue to keep close eye on sugars and talk to doctor about energy levels Long: [place]                Core Components/Risk Factors/Patient Goals at Discharge (Final Review):   Goals and Risk Factor Review - 09/30/22 0821       Core Components/Risk Factors/Patient Goals Review   Personal Goals Review Weight Management/Obesity;Hypertension;Diabetes    Review GaDaymeins doing well in rehab thus far.  He would like to lose a little more weight, but recently it has been holding steady. He is eating a keto/low carb diet with his diabetes.  His at home test has his A1c down to 5.5.  He is happy with where he is but would still like to lose more in his belly.  His pressures are doing well in class.  They are at the lowest that  he has every had.  They have ranged between 130-110s for most part on top an in the 60s on the bottom. He does note that he feels tired all the time and does not have any energy.  He was encouraged to talk to doctor.    Expected Outcomes Short: Continue to keep close eye on sugars and talk to doctor about energy levels Long: COnitnue to work on [place] blood pressures             ITP Comments:  ITP Comments     Row Name 09/09/22 1318 09/17/22 1244 09/18/22 0921 10/08/22 1045      ITP Comments Initial phone call completed. Diagnosis can be found in [facility] 12/14. EP Orientation scheduled for Wednesday 1/3 at 10:30. Completed 6MWT and gym orientation. Initial ITP created and sent for review to Dr. Emily Filbert, Medical Director. First full day of exercise!  Patient was oriented to gym and equipment including functions, settings, policies, and procedures.  Patient's individual exercise prescription and treatment plan were reviewed.  All starting workloads were established based on the results of the 6 minute walk test done at initial orientation visit.  The plan for exercise progression was also introduced and progression will be customized based on patient's performance and goals. 30 Day review completed. Medical Director ITP review done, changes made as directed, and signed approval by Medical Director.    new to program             Comments:

## 2022-10-09 ENCOUNTER — Encounter: Payer: PPO | Admitting: *Deleted

## 2022-10-09 DIAGNOSIS — Z955 Presence of coronary angioplasty implant and graft: Secondary | ICD-10-CM | POA: Diagnosis not present

## 2022-10-09 NOTE — Progress Notes (Signed)
Daily Session Note  Patient Details  Name: Marcus Huang MRN: [id number] Date of Birth: 06/17/1951 Referring Provider:   Flowsheet Row Cardiac Rehab from 09/17/2022 in [facility] Cardiac and Pulmonary Rehab  Referring Provider Paraschos/Fath       Encounter Date: 10/09/2022  Check In:  Session Check In - 10/09/22 0814       Check-In   Supervising physician immediately available to respond to emergencies See telemetry face sheet for immediately available ER MD    Location [facility]-Cardiac & Pulmonary Rehab    Staff Present Darlyne Russian, RN, Lorin Mercy, MS, ACSM CEP, Exercise Physiologist;Joseph Tessie Fass, [place]    Virtual Visit No    Medication changes reported     No    Fall or balance concerns reported    No    Warm-up and Cool-down Performed on first and last piece of equipment    Resistance Training Performed Yes    VAD Patient? No    PAD/SET Patient? No      Pain Assessment   Currently in Pain? No/denies                Social History   Tobacco Use  Smoking Status Never  Smokeless Tobacco Never    Goals Met:  Independence with exercise equipment Exercise tolerated well No report of concerns or symptoms today Strength training completed today  Goals Unmet:  Not Applicable  Comments: Pt able to follow exercise prescription today without complaint.  Will continue to monitor for progression.    Dr. Emily Filbert is Medical Director for [place].  Dr. Ottie Glazier is Medical Director for [facility] Pulmonary Rehabilitation.

## 2022-10-12 DIAGNOSIS — I70219 Atherosclerosis of native arteries of extremities with intermittent claudication, unspecified extremity: Secondary | ICD-10-CM | POA: Insufficient documentation

## 2022-10-12 NOTE — Progress Notes (Unsigned)
MRN : [id number]  Marcus Huang is a 72 y.o. (03/17/1951) male who presents with chief complaint of check circulation.  History of Present Illness:   The patient returns to the office for followup and review of the noninvasive studies.   There have been no interval changes in lower extremity symptoms. No interval shortening of the patient's claudication distance or development of rest pain symptoms. No new ulcers or wounds have occurred since the last visit.  There have been no significant changes to the patient's overall health care.  The patient denies amaurosis fugax or recent TIA symptoms. There are no documented recent neurological changes noted. There is no history of DVT, PE or superficial thrombophlebitis. The patient denies recent episodes of angina or shortness of breath.   ABI Rt=1.11 and Lt=1.16     No outpatient medications have been marked as taking for the 10/13/22 encounter (Appointment) with Delana Meyer, Dolores Lory, MD.    Past Medical History:  Diagnosis Date   Carotid artery thrombosis    Left Carotid Artery   Coronary artery disease    Diabetes ([place])    Diverticulosis    GERD (gastroesophageal reflux disease)    History of kidney stones    Hyperlipidemia    Hypertension    Renal mass    Sleep apnea    C-PAP    Past Surgical History:  Procedure Laterality Date   COLONOSCOPY WITH PROPOFOL N/A 08/15/2019   Procedure: COLONOSCOPY WITH PROPOFOL;  Surgeon: Lucilla Lame, MD;  Location: [place];  Service: Endoscopy;  Laterality: N/A;  sleep apnea   CORONARY STENT INTERVENTION N/A 08/05/2022   Procedure: CORONARY STENT INTERVENTION;  Surgeon: Isaias Cowman, MD;  Location: [place] CV LAB;  Service: Cardiovascular;  Laterality: N/A;   CORONARY STENT INTERVENTION Left 08/28/2022   Procedure: CORONARY STENT INTERVENTION;  Surgeon: Isaias Cowman, MD;  Location: [place] CV LAB;  Service: Cardiovascular;   Laterality: Left;   EYE SURGERY Bilateral    LASIK EYE SURGERY   INGUINAL HERNIA REPAIR Bilateral 05/12/2017   Procedure: LAPAROSCOPIC BILATERAL INGUINAL HERNIA REPAIR, umbilical hernia repair;  Surgeon: Florene Glen, MD;  Location: [facility];  Service: General;  Laterality: Bilateral;   LEFT HEART CATH AND CORONARY ANGIOGRAPHY N/A 08/05/2022   Procedure: LEFT HEART CATH AND CORONARY ANGIOGRAPHY;  Surgeon: Isaias Cowman, MD;  Location: [place] CV LAB;  Service: Cardiovascular;  Laterality: N/A;   none     TONSILLECTOMY      Social History Social History   Tobacco Use   Smoking status: Never   Smokeless tobacco: Never  Vaping Use   Vaping Use: Never used  Substance Use Topics   Alcohol use: No   Drug use: No    Family History Family History  Problem Relation Age of Onset   Hypertension Father    Stroke Father    Coronary artery disease Maternal Grandmother    Stroke Maternal Grandmother    Diabetes Mother    Hypertension Mother    Stroke Mother    Stroke Paternal Grandmother    Prostate cancer Neg Hx    Bladder Cancer Neg Hx     Allergies  Allergen Reactions   Metformin Other (See Comments)    Stomach problems   Rosuvastatin Other (See Comments)    fatigue     REVIEW OF SYSTEMS (Negative unless checked)  Constitutional: '[]'$ Weight loss  '[]'$ Fever  '[]'$ Chills  Cardiac: '[]'$ Chest pain   '[]'$ Chest pressure   '[]'$ Palpitations   '[]'$ Shortness of breath when laying flat   '[]'$ Shortness of breath with exertion. Vascular:  '[x]'$ Pain in legs with walking   '[]'$ Pain in legs at rest  '[]'$ History of DVT   '[]'$ Phlebitis   '[]'$ Swelling in legs   '[]'$ Varicose veins   '[]'$ Non-healing ulcers Pulmonary:   '[]'$ Uses home oxygen   '[]'$ Productive cough   '[]'$ Hemoptysis   '[]'$ Wheeze  '[]'$ COPD   '[]'$ Asthma Neurologic:  '[]'$ Dizziness   '[]'$ Seizures   '[]'$ History of stroke   '[]'$ History of TIA  '[]'$ Aphasia   '[]'$ Vissual changes   '[]'$ Weakness or numbness in arm   '[]'$ Weakness or numbness in leg Musculoskeletal:   '[]'$ Joint swelling    '[]'$ Joint pain   '[]'$ Low back pain Hematologic:  '[]'$ Easy bruising  '[]'$ Easy bleeding   '[]'$ Hypercoagulable state   '[]'$ Anemic Gastrointestinal:  '[]'$ Diarrhea   '[]'$ Vomiting  '[]'$ Gastroesophageal reflux/heartburn   '[]'$ Difficulty swallowing. Genitourinary:  '[]'$ Chronic kidney disease   '[]'$ Difficult urination  '[]'$ Frequent urination   '[]'$ Blood in urine Skin:  '[]'$ Rashes   '[]'$ Ulcers  Psychological:  '[]'$ History of anxiety   '[]'$  History of major depression.  Physical Examination  There were no vitals filed for this visit. There is no height or weight on file to calculate BMI. Gen: WD/WN, NAD Head: [place]/AT, No temporalis wasting.  Ear/Nose/Throat: Hearing grossly intact, nares w/o erythema or drainage Eyes: PER, EOMI, sclera nonicteric.  Neck: Supple, no masses.  No bruit or JVD.  Pulmonary:  Good air movement, no audible wheezing, no use of accessory muscles.  Cardiac: RRR, normal S1, S2, no Murmurs. Vascular:  Vessel Right Left  Radial Palpable Palpable  PT Palpable Palpable  DP Palpable Palpable  Gastrointestinal: soft, non-distended. No guarding/no peritoneal signs.  Musculoskeletal: M/S 5/5 throughout.  No visible deformity.  Neurologic: CN 2-12 intact. Pain and light touch intact in extremities.  Symmetrical.  Speech is fluent. Motor exam as listed above. Psychiatric: Judgment intact, Mood & affect appropriate for pt's clinical situation. Dermatologic: No rashes or ulcers noted.  No changes consistent with cellulitis.   CBC Lab Results  Component Value Date   WBC 7.3 08/06/2022   HGB 13.4 08/06/2022   HCT 38.0 (L) 08/06/2022   MCV 85.6 08/06/2022   PLT 198 08/06/2022    BMET    Component Value Date/Time   NA 137 08/06/2022 0510   K 3.3 (L) 08/06/2022 0510   CL 102 08/06/2022 0510   CO2 28 08/06/2022 0510   GLUCOSE 119 (H) 08/06/2022 0510   BUN 20 08/06/2022 0510   CREATININE 0.94 08/06/2022 0510   CALCIUM 9.0 08/06/2022 0510   GFRNONAA >60 08/06/2022 0510   GFRAA >60 06/01/2019 0822   CrCl  cannot be calculated (Patient's most recent lab result is older than the maximum 21 days allowed.).  COAG No results found for: "INR", "PROTIME"  Radiology MR Abdomen W Wo Contrast  Result Date: 09/25/2022 CLINICAL DATA:  Left kidney lower pole Bosniak category 3 cyst surveillance EXAM: MRI ABDOMEN WITHOUT AND WITH CONTRAST TECHNIQUE: Multiplanar multisequence MR imaging of the abdomen was performed both before and after the administration of intravenous contrast. CONTRAST:  7.93m GADAVIST GADOBUTROL 1 MMOL/ML IV SOLN COMPARISON:  07/25/2021 FINDINGS: Lower chest: Unremarkable Hepatobiliary: Unremarkable Pancreas:  Unremarkable Spleen:  Unremarkable Adrenals/Urinary Tract: Cystic or centrally necrotic mass of the left kidney lower pole measures 2.0 by 2.1 by 2.1 cm on T2 weighted images, and demonstrates up to 5 mm of wall enhancement and internal irregular septal enhancement. This lesion  has low T2 signal rim and intermediate precontrast T1 signal characteristics. By my measurements this lesion was previously 2.0 by 2.1 by 1.9 cm on 07/25/2021 and 1.6 by 1.8 by 1.6 cm on 07/28/2019. The appearance is most compatible with a low-grade malignancy and I am not convinced that the lesion is truly cystic rather than centrally necrotic. The kidneys appear otherwise normal.  Adrenal glands unremarkable. Stomach/Bowel: Periampullary duodenal diverticulum. Vascular/Lymphatic: No tumor thrombus in the left renal vein. No adenopathy observed. Other:  No supplemental non-categorized findings. Musculoskeletal: Mild lumbar spondylosis. IMPRESSION: 1. Cystic or centrally necrotic mass of the left kidney lower pole with thick enhancing walls has slowly enlarged over the last 3 years. Appearance is most compatible with a renal cell carcinoma. No adenopathy or tumor thrombus in the left renal vein. 2. Periampullary duodenal diverticulum. 3. Mild lumbar spondylosis and degenerative disc disease. Electronically Signed   By:  Van Clines M.D.   On: 09/25/2022 12:58     Assessment/Plan 1. 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  The patient does not voice lifestyle limiting changes at this point in time.  Noninvasive studies do not suggest clinically significant change.  No invasive studies, angiography or surgery at this time The patient should continue walking and begin a more formal exercise program.  The patient should continue antiplatelet therapy and aggressive treatment of the lipid abnormalities  No changes in the patient's medications at this time  Continued surveillance is indicated as atherosclerosis is likely to progress with time.    The patient will continue follow up with noninvasive studies as ordered.   2. Stenosis of carotid artery, unspecified laterality Recommend:  Given the patient's asymptomatic subcritical stenosis no further invasive testing or surgery at this time.  Continue antiplatelet therapy as prescribed Continue management of CAD, HTN and Hyperlipidemia Healthy heart diet,  encouraged exercise at least 4 times per week Follow up in 24 months with duplex ultrasound and physical exam   3. Malignant hypertension Continue antihypertensive medications as already ordered, these medications have been reviewed and there are no changes at this time.  4. Lymphedema Recommend:  No surgery or intervention at this point in time.  I have reviewed my discussion with the patient regarding venous insufficiency and why it causes symptoms. I have discussed with the patient the chronic skin changes that accompany venous insufficiency and the long term sequela such as ulceration. Patient will contnue wearing graduated compression stockings on a daily basis, as this has provided excellent control of his edema. The  patient will put the stockings on first thing in the morning and removing them in the evening. The patient is reminded not to sleep in the stockings.  In addition, behavioral modification including elevation during the day will be initiated. Exercise is strongly encouraged.  Previous duplex ultrasound of the lower extremities shows normal deep system, no significant superficial reflux was identified.  Given the patient's good control and lack of any problems regarding the venous insufficiency and lymphedema a lymph pump in not need at this time.    5. Mixed hyperlipidemia Continue statin as ordered and reviewed, no changes at this time  6. Raynaud's disease without gangrene Recommend:  The patient is currently tolerating the Raynaud's changes fairly well. Lengthy discussion regarding keeping the feet and the hands warm; gloves, washing with only warm water (especially avoiding cold water immersion)  and using wool socks, specifically Smartwool was recommended.   7. Coronary artery disease involving native coronary artery of native heart with angina pectoris ([place]) Continue cardiac and antihypertensive medications as already ordered and reviewed, no changes at this time.  Continue statin as ordered and reviewed, no changes at this time  Nitrates PRN for chest pain    Hortencia Pilar, MD  10/12/2022 11:04 AM

## 2022-10-13 ENCOUNTER — Ambulatory Visit (INDEPENDENT_AMBULATORY_CARE_PROVIDER_SITE_OTHER): Payer: PPO

## 2022-10-13 ENCOUNTER — Encounter (INDEPENDENT_AMBULATORY_CARE_PROVIDER_SITE_OTHER): Payer: Self-pay | Admitting: Vascular Surgery

## 2022-10-13 ENCOUNTER — Ambulatory Visit (INDEPENDENT_AMBULATORY_CARE_PROVIDER_SITE_OTHER): Payer: PPO | Admitting: Vascular Surgery

## 2022-10-13 VITALS — BP 179/86 | HR 56 | Ht 68.0 in | Wt 172.0 lb

## 2022-10-13 DIAGNOSIS — I890 Lymphedema, not elsewhere classified: Secondary | ICD-10-CM | POA: Diagnosis not present

## 2022-10-13 DIAGNOSIS — I70219 Atherosclerosis of native arteries of extremities with intermittent claudication, unspecified extremity: Secondary | ICD-10-CM

## 2022-10-13 DIAGNOSIS — I10 Essential (primary) hypertension: Secondary | ICD-10-CM | POA: Diagnosis not present

## 2022-10-13 DIAGNOSIS — I6529 Occlusion and stenosis of unspecified carotid artery: Secondary | ICD-10-CM | POA: Diagnosis not present

## 2022-10-13 DIAGNOSIS — I7300 Raynaud's syndrome without gangrene: Secondary | ICD-10-CM

## 2022-10-13 DIAGNOSIS — E782 Mixed hyperlipidemia: Secondary | ICD-10-CM | POA: Diagnosis not present

## 2022-10-13 DIAGNOSIS — I739 Peripheral vascular disease, unspecified: Secondary | ICD-10-CM | POA: Diagnosis not present

## 2022-10-13 DIAGNOSIS — I25119 Atherosclerotic heart disease of native coronary artery with unspecified angina pectoris: Secondary | ICD-10-CM | POA: Diagnosis not present

## 2022-10-14 ENCOUNTER — Encounter: Payer: PPO | Admitting: *Deleted

## 2022-10-14 DIAGNOSIS — Z955 Presence of coronary angioplasty implant and graft: Secondary | ICD-10-CM

## 2022-10-14 NOTE — Progress Notes (Signed)
Daily Session Note  Patient Details  Name: Marcus Huang MRN: [id number] Date of Birth: 03-23-1951 Referring Provider:   Flowsheet Row Cardiac Rehab from 09/17/2022 in [facility] Cardiac and Pulmonary Rehab  Referring Provider Paraschos/Fath       Encounter Date: 10/14/2022  Check In:  Session Check In - 10/14/22 0840       Check-In   Supervising physician immediately available to respond to emergencies See telemetry face sheet for immediately available ER MD    Location [facility]-Cardiac & Pulmonary Rehab    Staff Present Heath Lark, RN, BSN, CCRP;Jessica Tarrant, MA, RCEP, CCRP, Bertram Gala, MS, ACSM CEP, Exercise Physiologist    Virtual Visit No    Medication changes reported     No    Fall or balance concerns reported    No    Warm-up and Cool-down Performed on first and last piece of equipment    Resistance Training Performed Yes    VAD Patient? No    PAD/SET Patient? No      Pain Assessment   Currently in Pain? No/denies                Social History   Tobacco Use  Smoking Status Never  Smokeless Tobacco Never    Goals Met:  Independence with exercise equipment Exercise tolerated well No report of concerns or symptoms today  Goals Unmet:  Not Applicable  Comments: Pt able to follow exercise prescription today without complaint.  Will continue to monitor for progression.    Dr. Emily Filbert is Medical Director for [place].  Dr. Ottie Glazier is Medical Director for [facility] Pulmonary Rehabilitation.

## 2022-10-16 ENCOUNTER — Encounter: Payer: PPO | Attending: Cardiology | Admitting: *Deleted

## 2022-10-16 ENCOUNTER — Encounter (INDEPENDENT_AMBULATORY_CARE_PROVIDER_SITE_OTHER): Payer: Self-pay | Admitting: Vascular Surgery

## 2022-10-16 DIAGNOSIS — Z955 Presence of coronary angioplasty implant and graft: Secondary | ICD-10-CM | POA: Diagnosis not present

## 2022-10-16 DIAGNOSIS — Z48812 Encounter for surgical aftercare following surgery on the circulatory system: Secondary | ICD-10-CM | POA: Insufficient documentation

## 2022-10-16 NOTE — Progress Notes (Signed)
Daily Session Note  Patient Details  Name: Marcus Huang MRN: [id number] Date of Birth: 1951-06-11 Referring Provider:   Flowsheet Row Cardiac Rehab from 09/17/2022 in [facility] Cardiac and Pulmonary Rehab  Referring Provider Paraschos/Fath       Encounter Date: 10/16/2022  Check In:  Session Check In - 10/16/22 0754       Check-In   Supervising physician immediately available to respond to emergencies See telemetry face sheet for immediately available ER MD    Location [facility]-Cardiac & Pulmonary Rehab    Staff Present Darlyne Russian, RN, Lorin Mercy, MS, ACSM CEP, Exercise Physiologist;Joseph Tessie Fass, [place]    Virtual Visit No    Medication changes reported     No    Fall or balance concerns reported    No    Warm-up and Cool-down Performed on first and last piece of equipment    Resistance Training Performed Yes    VAD Patient? No    PAD/SET Patient? No      Pain Assessment   Currently in Pain? No/denies                Social History   Tobacco Use  Smoking Status Never  Smokeless Tobacco Never    Goals Met:  Independence with exercise equipment Exercise tolerated well No report of concerns or symptoms today Strength training completed today  Goals Unmet:  Not Applicable  Comments: Pt able to follow exercise prescription today without complaint.  Will continue to monitor for progression.    Dr. Emily Filbert is Medical Director for [place].  Dr. Ottie Glazier is Medical Director for [facility] Pulmonary Rehabilitation.

## 2022-10-21 ENCOUNTER — Encounter: Payer: PPO | Admitting: *Deleted

## 2022-10-21 DIAGNOSIS — Z955 Presence of coronary angioplasty implant and graft: Secondary | ICD-10-CM

## 2022-10-21 NOTE — Progress Notes (Signed)
Daily Session Note  Patient Details  Name: Marcus Huang MRN: [id number] Date of Birth: 1951-06-07 Referring Provider:   Flowsheet Row Cardiac Rehab from 09/17/2022 in [facility] Cardiac and Pulmonary Rehab  Referring Provider Paraschos/Fath       Encounter Date: 10/21/2022  Check In:  Session Check In - 10/21/22 0837       Check-In   Supervising physician immediately available to respond to emergencies See telemetry face sheet for immediately available ER MD    Location [facility]-Cardiac & Pulmonary Rehab    Staff Present Heath Lark, RN, BSN, CCRP;Jessica [place], MA, RCEP, CCRP, Bertram Gala, MS, ACSM CEP, Exercise Physiologist    Virtual Visit No    Medication changes reported     No    Fall or balance concerns reported    No    Warm-up and Cool-down Performed on first and last piece of equipment    Resistance Training Performed Yes    VAD Patient? No    PAD/SET Patient? No      Pain Assessment   Currently in Pain? No/denies                Social History   Tobacco Use  Smoking Status Never  Smokeless Tobacco Never    Goals Met:  Independence with exercise equipment Exercise tolerated well No report of concerns or symptoms today  Goals Unmet:  Not Applicable  Comments: Pt able to follow exercise prescription today without complaint.  Will continue to monitor for progression.    Dr. Emily Filbert is Medical Director for [place].  Dr. Ottie Glazier is Medical Director for [facility] Pulmonary Rehabilitation.

## 2022-10-23 ENCOUNTER — Encounter: Payer: PPO | Admitting: *Deleted

## 2022-10-23 DIAGNOSIS — Z955 Presence of coronary angioplasty implant and graft: Secondary | ICD-10-CM

## 2022-10-23 NOTE — Progress Notes (Signed)
Daily Session Note  Patient Details  Name: Marcus Huang MRN: [id number] Date of Birth: 03/07/1951 Referring Provider:   Flowsheet Row Cardiac Rehab from 09/17/2022 in [facility] Cardiac and Pulmonary Rehab  Referring Provider Paraschos/Fath       Encounter Date: 10/23/2022  Check In:  Session Check In - 10/23/22 0817       Check-In   Supervising physician immediately available to respond to emergencies See telemetry face sheet for immediately available ER MD    Location [facility]-Cardiac & Pulmonary Rehab    Staff Present Darlyne Russian, RN, ADN;Jessica Luan Pulling, MA, RCEP, CCRP, CCET;Joseph [place], [place]    Virtual Visit No    Medication changes reported     No    Fall or balance concerns reported    No    Warm-up and Cool-down Performed on first and last piece of equipment    Resistance Training Performed Yes    VAD Patient? No    PAD/SET Patient? No      Pain Assessment   Currently in Pain? No/denies                Social History   Tobacco Use  Smoking Status Never  Smokeless Tobacco Never    Goals Met:  Independence with exercise equipment Exercise tolerated well No report of concerns or symptoms today Strength training completed today  Goals Unmet:  Not Applicable  Comments: Pt able to follow exercise prescription today without complaint.  Will continue to monitor for progression.    Dr. Emily Filbert is Medical Director for [place].  Dr. Ottie Glazier is Medical Director for [facility] Pulmonary Rehabilitation.

## 2022-10-27 DIAGNOSIS — E785 Hyperlipidemia, unspecified: Secondary | ICD-10-CM | POA: Diagnosis not present

## 2022-10-27 DIAGNOSIS — E1165 Type 2 diabetes mellitus with hyperglycemia: Secondary | ICD-10-CM | POA: Diagnosis not present

## 2022-10-28 ENCOUNTER — Encounter: Payer: PPO | Admitting: *Deleted

## 2022-10-28 DIAGNOSIS — Z955 Presence of coronary angioplasty implant and graft: Secondary | ICD-10-CM

## 2022-10-28 NOTE — Progress Notes (Signed)
Daily Session Note  Patient Details  Name: Marcus Huang MRN: [id number] Date of Birth: [birth year] Referring Provider:   Flowsheet Row Cardiac Rehab from 09/17/2022 in [facility] Cardiac and Pulmonary Rehab  Referring Provider Paraschos/Fath       Encounter Date: 10/28/2022  Check In:  Session Check In - 10/28/22 0835       Check-In   Supervising physician immediately available to respond to emergencies See telemetry face sheet for immediately available ER MD    Location [facility]-Cardiac & Pulmonary Rehab    Staff Present Heath Lark, RN, BSN, CCRP;Jessica [place], MA, RCEP, CCRP, Bertram Gala, MS, ACSM CEP, Exercise Physiologist    Virtual Visit No    Medication changes reported     No    Fall or balance concerns reported    No    Warm-up and Cool-down Performed on first and last piece of equipment    Resistance Training Performed Yes    VAD Patient? No    PAD/SET Patient? No      Pain Assessment   Currently in Pain? No/denies                Social History   Tobacco Use  Smoking Status Never  Smokeless Tobacco Never    Goals Met:  Independence with exercise equipment Exercise tolerated well No report of concerns or symptoms today  Goals Unmet:  Not Applicable  Comments: Pt able to follow exercise prescription today without complaint.  Will continue to monitor for progression.    Dr. Emily Filbert is Medical Director for [place].  Dr. Ottie Glazier is Medical Director for [facility] Pulmonary Rehabilitation.

## 2022-10-30 ENCOUNTER — Encounter: Payer: PPO | Admitting: *Deleted

## 2022-10-30 DIAGNOSIS — Z955 Presence of coronary angioplasty implant and graft: Secondary | ICD-10-CM | POA: Diagnosis not present

## 2022-10-30 NOTE — Progress Notes (Signed)
Daily Session Note  Patient Details  Name: Marcus Huang MRN: [id number] Date of Birth: [birth year] Referring Provider:   Flowsheet Row Cardiac Rehab from 09/17/2022 in [facility] Cardiac and Pulmonary Rehab  Referring Provider Paraschos/Fath       Encounter Date: 10/30/2022  Check In:  Session Check In - 10/30/22 0757       Check-In   Supervising physician immediately available to respond to emergencies See telemetry face sheet for immediately available ER MD    Location [facility]-Cardiac & Pulmonary Rehab    Staff Present Darlyne Russian, RN, ADN;Jessica Luan Pulling, MA, RCEP, CCRP, Bertram Gala, MS, ACSM CEP, Exercise Physiologist;Joseph Tessie Fass, [place]    Virtual Visit No    Medication changes reported     No    Fall or balance concerns reported    No    Warm-up and Cool-down Performed on first and last piece of equipment    Resistance Training Performed Yes    VAD Patient? No    PAD/SET Patient? No      Pain Assessment   Currently in Pain? No/denies                Social History   Tobacco Use  Smoking Status Never  Smokeless Tobacco Never    Goals Met:  Independence with exercise equipment Exercise tolerated well No report of concerns or symptoms today Strength training completed today  Goals Unmet:  Not Applicable  Comments: Pt able to follow exercise prescription today without complaint.  Will continue to monitor for progression.    Dr. Emily Filbert is Medical Director for [place].  Dr. Ottie Glazier is Medical Director for [facility] Pulmonary Rehabilitation.

## 2022-10-31 ENCOUNTER — Encounter: Payer: Self-pay | Admitting: Urology

## 2022-10-31 ENCOUNTER — Ambulatory Visit: Payer: PPO | Admitting: Urology

## 2022-10-31 VITALS — BP 158/77 | HR 66 | Ht 68.0 in | Wt 170.0 lb

## 2022-10-31 DIAGNOSIS — N2889 Other specified disorders of kidney and ureter: Secondary | ICD-10-CM | POA: Diagnosis not present

## 2022-10-31 NOTE — Progress Notes (Signed)
   Established Patient Office Visit  Subjective   Patient ID: Marcus Huang, male    DOB: 25-May-1951  Age: 72 y.o. MRN: [id number]  Chief Complaint  Patient presents with  . Other    HPI  {History (Optional):23778}  ROS    Objective:     BP (!) 158/77   Pulse 66   Ht 5' 8"$  (1.727 m)   Wt 170 lb (77.1 kg)   BMI 25.85 kg/m  {Vitals History (Optional):23777}  Physical Exam   No results found for any visits on 10/31/22.  {Labs (Optional):23779}  The ASCVD Risk score (Arnett DK, et al., 2019) failed to calculate for the following reasons:   Cannot find a previous HDL lab   Cannot find a previous total cholesterol lab    Assessment & Plan:   Problem List Items Addressed This Visit   None   No follow-ups on file.    Abbie Sons, MD

## 2022-11-01 ENCOUNTER — Encounter: Payer: Self-pay | Admitting: Urology

## 2022-11-03 DIAGNOSIS — E1165 Type 2 diabetes mellitus with hyperglycemia: Secondary | ICD-10-CM | POA: Diagnosis not present

## 2022-11-03 DIAGNOSIS — G8929 Other chronic pain: Secondary | ICD-10-CM | POA: Diagnosis not present

## 2022-11-03 DIAGNOSIS — G4733 Obstructive sleep apnea (adult) (pediatric): Secondary | ICD-10-CM | POA: Diagnosis not present

## 2022-11-03 DIAGNOSIS — I10 Essential (primary) hypertension: Secondary | ICD-10-CM | POA: Diagnosis not present

## 2022-11-03 DIAGNOSIS — M5442 Lumbago with sciatica, left side: Secondary | ICD-10-CM | POA: Diagnosis not present

## 2022-11-03 DIAGNOSIS — I700 Atherosclerosis of aorta: Secondary | ICD-10-CM | POA: Diagnosis not present

## 2022-11-03 DIAGNOSIS — E785 Hyperlipidemia, unspecified: Secondary | ICD-10-CM | POA: Diagnosis not present

## 2022-11-03 DIAGNOSIS — R252 Cramp and spasm: Secondary | ICD-10-CM | POA: Diagnosis not present

## 2022-11-04 ENCOUNTER — Encounter: Payer: PPO | Admitting: *Deleted

## 2022-11-04 DIAGNOSIS — Z955 Presence of coronary angioplasty implant and graft: Secondary | ICD-10-CM | POA: Diagnosis not present

## 2022-11-04 NOTE — Progress Notes (Signed)
Daily Session Note  Patient Details  Name: Marcus Huang MRN: [id number] Date of Birth: 1950/11/25 Referring Provider:   Flowsheet Row Cardiac Rehab from 09/17/2022 in [facility] Cardiac and Pulmonary Rehab  Referring Provider Paraschos/Fath       Encounter Date: 11/04/2022  Check In:  Session Check In - 11/04/22 0816       Check-In   Supervising physician immediately available to respond to emergencies See telemetry face sheet for immediately available ER MD    Location [facility]-Cardiac & Pulmonary Rehab    Staff Present Nyoka Cowden, RN, BSN, Melina Schools, MS, ACSM CEP, Exercise Physiologist;Mckenzy Salazar Luan Pulling, MA, RCEP, CCRP, CCET    Medication changes reported     No    Fall or balance concerns reported    No    Tobacco Cessation No Change    Warm-up and Cool-down Performed on first and last piece of equipment    Resistance Training Performed Yes    VAD Patient? No      Pain Assessment   Currently in Pain? No/denies    Multiple Pain Sites No                Social History   Tobacco Use  Smoking Status Never  Smokeless Tobacco Never    Goals Met:  Independence with exercise equipment Exercise tolerated well No report of concerns or symptoms today  Goals Unmet:  Not Applicable  Comments: Pt able to follow exercise prescription today without complaint.  Will continue to monitor for progression.    Dr. Emily Filbert is Medical Director for [place].  Dr. Ottie Glazier is Medical Director for [facility] Pulmonary Rehabilitation.

## 2022-11-05 ENCOUNTER — Encounter: Payer: Self-pay | Admitting: *Deleted

## 2022-11-05 DIAGNOSIS — Z955 Presence of coronary angioplasty implant and graft: Secondary | ICD-10-CM

## 2022-11-05 NOTE — Progress Notes (Signed)
Cardiac Individual Treatment Plan  Patient Details  Name: Marcus Huang MRN: [id number] Date of Birth: Jan 04, 1951 Referring Provider:   Flowsheet Row Cardiac Rehab from 09/17/2022 in [facility] Cardiac and Pulmonary Rehab  Referring Provider Paraschos/Fath       Initial Encounter Date:  Flowsheet Row Cardiac Rehab from 09/17/2022 in [facility] Cardiac and Pulmonary Rehab  Date 09/17/22       Visit Diagnosis: Status post coronary artery stent placement  Patient's Home Medications on Admission:  Current Outpatient Medications:    amLODipine (NORVASC) 2.5 MG tablet, Take 1 tablet (2.5 mg total) by mouth in the morning and at bedtime., Disp: 60 tablet, Rfl: 11   aspirin EC 81 MG tablet, Take 1 tablet (81 mg total) by mouth daily at 12 noon., Disp: 30 tablet, Rfl: 2   atorvastatin (LIPITOR) 80 MG tablet, Take 1 tablet (80 mg total) by mouth at bedtime., Disp: 30 tablet, Rfl: 11   azelastine (ASTELIN) 0.1 % nasal spray, Place into the nose. PRN, Disp: , Rfl:    clopidogrel (PLAVIX) 75 MG tablet, Take 1 tablet (75 mg total) by mouth daily., Disp: 30 tablet, Rfl: 11   hydrochlorothiazide (HYDRODIURIL) 25 MG tablet, Take 1 tablet (25 mg total) by mouth daily., Disp: 30 tablet, Rfl: 11   metoprolol tartrate (LOPRESSOR) 25 MG tablet, Take 0.5 tablets (12.5 mg total) by mouth 2 (two) times daily., Disp: 30 tablet, Rfl: 11   Multiple Vitamin (MULTIVITAMIN) tablet, Take 1 tablet by mouth daily., Disp: , Rfl:    pioglitazone (ACTOS) 30 MG tablet, Take 30 mg by mouth daily., Disp: , Rfl:    PREVIDENT 5000 SENSITIVE 1.1-5 % GEL, Place onto teeth as directed., Disp: , Rfl:    traMADol (ULTRAM) 50 MG tablet, TAKE 1 TABLET(50 MG) BY MOUTH EVERY 6 HOURS AS NEEDED FOR PAIN, Disp: , Rfl:    VITAMIN D PO, Take 5,000 Units by mouth daily., Disp: , Rfl:   Past Medical History: Past Medical History:  Diagnosis Date   Carotid artery thrombosis    Left Carotid Artery   Coronary artery disease    Diabetes (HCC)     Diverticulosis    GERD (gastroesophageal reflux disease)    History of kidney stones    Hyperlipidemia    Hypertension    Renal mass    Sleep apnea    C-PAP    Tobacco Use: Social History   Tobacco Use  Smoking Status Never  Smokeless Tobacco Never    Labs: Review Flowsheet        No data to display           Exercise Target Goals: Exercise Program Goal: Individual exercise prescription set using results from initial 6 min walk test and THRR while considering  patient's activity barriers and safety.   Exercise Prescription Goal: Initial exercise prescription builds to 30-45 minutes a day of aerobic activity, 2-3 days per week.  Home exercise guidelines will be given to patient during program as part of exercise prescription that the participant will acknowledge.   Education: Aerobic Exercise: - Group verbal and visual presentation on the components of exercise prescription. Introduces F.I.T.T principle from ACSM for exercise prescriptions.  Reviews F.I.T.T. principles of aerobic exercise including progression. Written material given at graduation.   Education: Resistance Exercise: - Group verbal and visual presentation on the components of exercise prescription. Introduces F.I.T.T principle from ACSM for exercise prescriptions  Reviews F.I.T.T. principles of resistance exercise including progression. Written material given at graduation. Flowsheet  Row Cardiac Rehab from 10/30/2022 in [facility] Cardiac and Pulmonary Rehab  Date 10/09/22  Educator [facility]  Instruction Review Code 1- Verbalizes Understanding        Education: Exercise & Equipment Safety: - Individual verbal instruction and demonstration of equipment use and safety with use of the equipment. Flowsheet Row Cardiac Rehab from 10/30/2022 in [facility] Cardiac and Pulmonary Rehab  Date 09/17/22  Educator [facility]  Instruction Review Code 1- Verbalizes Understanding       Education: Exercise Physiology & General Exercise  Guidelines: - Group verbal and written instruction with models to review the exercise physiology of the cardiovascular system and associated critical values. Provides general exercise guidelines with specific guidelines to those with heart or lung disease.    Education: Flexibility, Balance, Mind/Body Relaxation: - Group verbal and visual presentation with interactive activity on the components of exercise prescription. Introduces F.I.T.T principle from ACSM for exercise prescriptions. Reviews F.I.T.T. principles of flexibility and balance exercise training including progression. Also discusses the mind body connection.  Reviews various relaxation techniques to help reduce and manage stress (i.e. Deep breathing, progressive muscle relaxation, and visualization). Balance handout provided to take home. Written material given at graduation. Flowsheet Row Cardiac Rehab from 10/30/2022 in [facility] Cardiac and Pulmonary Rehab  Date 10/16/22  Educator [facility]  Instruction Review Code 1- Verbalizes Understanding       Activity Barriers & Risk Stratification:  Activity Barriers & Cardiac Risk Stratification - 09/17/22 1248       Activity Barriers & Cardiac Risk Stratification   Activity Barriers None    Cardiac Risk Stratification Moderate             6 Minute Walk:  6 Minute Walk     Row Name 09/17/22 1246         6 Minute Walk   Phase Initial     Distance 1300 feet     Walk Time 6 minutes     # of Rest Breaks 0     MPH 2.46     METS 3.14     RPE 11     Perceived Dyspnea  0     VO2 Peak 10.98     Symptoms No     Resting HR 62 bpm     Resting BP 148/82     Resting Oxygen Saturation  99 %     Exercise Oxygen Saturation  during 6 min walk 98 %     Max Ex. HR 103 bpm     Max Ex. BP 138/62     2 Minute Post BP 124/72              Oxygen Initial Assessment:   Oxygen Re-Evaluation:   Oxygen Discharge (Final Oxygen Re-Evaluation):   Initial Exercise Prescription:  Initial  Exercise Prescription - 09/17/22 1200       Date of Initial Exercise RX and Referring Provider   Date 09/17/22    Referring Provider Paraschos/Fath      Oxygen   Maintain Oxygen Saturation 88% or higher      Treadmill   MPH 2.5    Grade 1    Minutes 15    METs 3.26      Elliptical   Level 1    Speed 3    Minutes 1    METs 3.14      Prescription Details   Frequency (times per week) 2    Duration Progress to 30 minutes of continuous aerobic without signs/symptoms of  physical distress      Intensity   THRR 40-80% of Max Heartrate 97-133    Ratings of Perceived Exertion 11-13    Perceived Dyspnea 0-4      Progression   Progression Continue to progress workloads to maintain intensity without signs/symptoms of physical distress.      Resistance Training   Training Prescription Yes    Weight 5    Reps 10-15             Perform Capillary Blood Glucose checks as needed.  Exercise Prescription Changes:   Exercise Prescription Changes     Row Name 09/17/22 1200 09/29/22 0900 09/30/22 0800 10/13/22 1300 10/27/22 0900     Response to Exercise   Blood Pressure (Admit) 148/82 130/64 -- 144/72 102/60   Blood Pressure (Exercise) 138/62 154/64 -- 148/74 134/66   Blood Pressure (Exit) 124/72 124/64 -- 138/70 100/60   Heart Rate (Admit) 62 bpm 70 bpm -- 74 bpm 61 bpm   Heart Rate (Exercise) 103 bpm 103 bpm -- 123 bpm 116 bpm   Heart Rate (Exit) 64 bpm 66 bpm -- 82 bpm 60 bpm   Oxygen Saturation (Admit) 99 % -- -- -- --   Oxygen Saturation (Exercise) 98 % -- -- -- --   Oxygen Saturation (Exit) 98 % -- -- -- --   Rating of Perceived Exertion (Exercise) 11 13 -- 12 14   Perceived Dyspnea (Exercise) 0 -- -- -- --   Symptoms none none -- Stated that "legs feel like jello" none   Comments 6 MWT results 2nd full day of exercise -- -- --   Duration -- Continue with 30 min of aerobic exercise without signs/symptoms of physical distress. -- Continue with 30 min of aerobic  exercise without signs/symptoms of physical distress. Continue with 30 min of aerobic exercise without signs/symptoms of physical distress.   Intensity -- THRR unchanged -- THRR unchanged THRR unchanged     Progression   Progression -- Continue to progress workloads to maintain intensity without signs/symptoms of physical distress. -- Continue to progress workloads to maintain intensity without signs/symptoms of physical distress. Continue to progress workloads to maintain intensity without signs/symptoms of physical distress.   Average METs -- 3.21 -- 4.86 4.97     Resistance Training   Training Prescription -- Yes -- Yes Yes   Weight -- 5 lb -- 5 lb 6 lb   Reps -- 10-15 -- 10-15 10-15     Interval Training   Interval Training -- No -- No Yes   Equipment -- -- -- -- Treadmill;Recumbant Elliptical   Comments -- -- -- -- incline vary 4-8%; L 2.5-5     Treadmill   MPH -- 3.1 -- 3.2 3.4  intervals   Grade -- 3 -- 6 8   Minutes -- 15 -- 15 15   METs -- 4.66 -- 6.1 6.8     Recumbant Elliptical   Level -- 2.3 -- 3 5  intervals   Minutes -- 15 -- 15 15   METs -- 1.9 -- -- 3.7     Elliptical   Level -- 1 -- 1 --   Speed -- 2.9 -- 4 --   Minutes -- 15 -- 15 --   METs -- 2.7 -- 3.2 --     REL-XR   Level -- -- -- 8 8   Minutes -- -- -- 15 15   METs -- -- -- 5 4.4     Home Exercise Plan  Plans to continue exercise at -- -- Home (comment)  walking at home Home (comment)  walking at home Home (comment)  walking at home   Frequency -- -- Add 2 additional days to program exercise sessions. Add 2 additional days to program exercise sessions. Add 2 additional days to program exercise sessions.   Initial Home Exercises Provided -- -- 09/30/22 09/30/22 09/30/22     Oxygen   Maintain Oxygen Saturation -- 88% or higher -- 88% or higher 88% or higher            Exercise Comments:   Exercise Comments     Row Name 09/18/22 [id number]           Exercise Comments First full day of  exercise!  Patient was oriented to gym and equipment including functions, settings, policies, and procedures.  Patient's individual exercise prescription and treatment plan were reviewed.  All starting workloads were established based on the results of the 6 minute walk test done at initial orientation visit.  The plan for exercise progression was also introduced and progression will be customized based on patient's performance and goals.                Exercise Goals and Review:   Exercise Goals     Row Name 09/17/22 1252             Exercise Goals   Increase Physical Activity Yes       Intervention Provide advice, education, support and counseling about physical activity/exercise needs.;Develop an individualized exercise prescription for aerobic and resistive training based on initial evaluation findings, risk stratification, comorbidities and participant's personal goals.       Expected Outcomes Short Term: Attend rehab on a regular basis to increase amount of physical activity.;Long Term: Add in home exercise to make exercise part of routine and to increase amount of physical activity.;Long Term: Exercising regularly at least 3-5 days a week.       Increase Strength and Stamina Yes       Intervention Provide advice, education, support and counseling about physical activity/exercise needs.;Develop an individualized exercise prescription for aerobic and resistive training based on initial evaluation findings, risk stratification, comorbidities and participant's personal goals.       Expected Outcomes Short Term: Increase workloads from initial exercise prescription for resistance, speed, and METs.;Short Term: Perform resistance training exercises routinely during rehab and add in resistance training at home;Long Term: Improve cardiorespiratory fitness, muscular endurance and strength as measured by increased METs and functional capacity (6MWT)       Able to understand and use rate of  perceived exertion (RPE) scale Yes       Intervention Provide education and explanation on how to use RPE scale       Expected Outcomes Short Term: Able to use RPE daily in rehab to express subjective intensity level;Long Term:  Able to use RPE to guide intensity level when exercising independently       Able to understand and use Dyspnea scale Yes       Intervention Provide education and explanation on how to use Dyspnea scale       Expected Outcomes Short Term: Able to use Dyspnea scale daily in rehab to express subjective sense of shortness of breath during exertion;Long Term: Able to use Dyspnea scale to guide intensity level when exercising independently       Knowledge and understanding of Target Heart Rate Range (THRR) Yes       Intervention  Provide education and explanation of THRR including how the numbers were predicted and where they are located for reference       Expected Outcomes Short Term: Able to state/look up THRR;Long Term: Able to use THRR to govern intensity when exercising independently;Short Term: Able to use daily as guideline for intensity in rehab       Able to check pulse independently Yes       Intervention Provide education and demonstration on how to check pulse in carotid and radial arteries.;Review the importance of being able to check your own pulse for safety during independent exercise       Expected Outcomes Short Term: Able to explain why pulse checking is important during independent exercise       Understanding of Exercise Prescription Yes       Intervention Provide education, explanation, and written materials on patient's individual exercise prescription       Expected Outcomes Short Term: Able to explain program exercise prescription;Long Term: Able to explain home exercise prescription to exercise independently                Exercise Goals Re-Evaluation :  Exercise Goals Re-Evaluation     Row Name 09/18/22 [id number] 09/29/22 0934 09/30/22 0813 10/13/22  1344 10/27/22 0922     Exercise Goal Re-Evaluation   Exercise Goals Review Able to understand and use rate of perceived exertion (RPE) scale;Able to understand and use Dyspnea scale;Knowledge and understanding of Target Heart Rate Range (THRR);Understanding of Exercise Prescription Increase Physical Activity;Increase Strength and Stamina;Understanding of Exercise Prescription Increase Physical Activity;Increase Strength and Stamina;Understanding of Exercise Prescription;Able to understand and use Dyspnea scale;Able to check pulse independently;Knowledge and understanding of Target Heart Rate Range (THRR);Able to understand and use rate of perceived exertion (RPE) scale Increase Physical Activity;Increase Strength and Stamina;Understanding of Exercise Prescription Increase Physical Activity;Increase Strength and Stamina;Understanding of Exercise Prescription   Comments Reviewed RPE scale, THR and program prescription with pt today.  Pt voiced understanding and was given a copy of goals to take home. Marcus Huang is off to a good start with rehab for the first couple of sessions he has been here . He was able to exercise on the elliptical well at about a 3 mph speed. He also increased his workload on the treadmill to a 3.1 mph/ 3% already. RPEs are at an appropriate level. Will continue to monitor as he progresses in the program. Marcus Huang is on day three thus far for rehab. He is doing well with his workloads and starting to bring them up slowly.  He tried out the elliptical for the first time today.  Reviewed home exercise with pt today.  Pt plans to walk at home for exercise.  Reviewed THR, pulse, RPE, sign and symptoms, pulse oximetery and when to call 911 or MD.  Also discussed weather considerations and indoor options.  Pt voiced understanding. Marcus Huang continues to do well in rehab. He recently increased his overall average MET level to 4.86 METs. He also increased his treadmill workload to a speed of 3.2 mph and an incline  of 6%. He improved to level 3 on the REL and level 8 on the XR as well. We will continue to monitor his progress. Marcus Huang continues to do well in rehab. He started doing intervals on the treadmill, working from 4-8% incline, working almost up to 7 METS! He also started intervals on the REL varying his workload from level 2.5-5. He is now using 6 lbs for handweights. We  will continue to monitor.   Expected Outcomes Short: Use RPE daily to regulate intensity. Long: Follow program prescription in THR. Short: Continue to work up endurance on elliptical Long: Continue to increase overall MET level and stamina Short: Start to add in more walking at home to build back up.   long: Continue to attend rehab regularly Short: Continue to increae treadmill workload. Long: Continue to improve strength and stamina. Short: Continue intervals on both treadmill and REL Long: Continue to increase overall MET level and stamina            Discharge Exercise Prescription (Final Exercise Prescription Changes):  Exercise Prescription Changes - 10/27/22 0900       Response to Exercise   Blood Pressure (Admit) 102/60    Blood Pressure (Exercise) 134/66    Blood Pressure (Exit) 100/60    Heart Rate (Admit) 61 bpm    Heart Rate (Exercise) 116 bpm    Heart Rate (Exit) 60 bpm    Rating of Perceived Exertion (Exercise) 14    Symptoms none    Duration Continue with 30 min of aerobic exercise without signs/symptoms of physical distress.    Intensity THRR unchanged      Progression   Progression Continue to progress workloads to maintain intensity without signs/symptoms of physical distress.    Average METs 4.97      Resistance Training   Training Prescription Yes    Weight 6 lb    Reps 10-15      Interval Training   Interval Training Yes    Equipment Treadmill;Recumbant Elliptical    Comments incline vary 4-8%; L 2.5-5      Treadmill   MPH 3.4   intervals   Grade 8    Minutes 15    METs 6.8      Recumbant  Elliptical   Level 5   intervals   Minutes 15    METs 3.7      REL-XR   Level 8    Minutes 15    METs 4.4      Home Exercise Plan   Plans to continue exercise at Home (comment)   walking at home   Frequency Add 2 additional days to program exercise sessions.    Initial Home Exercises Provided 09/30/22      Oxygen   Maintain Oxygen Saturation 88% or higher             Nutrition:  Target Goals: Understanding of nutrition guidelines, daily intake of sodium <1563m, cholesterol <2034m calories 30% from fat and 7% or less from saturated fats, daily to have 5 or more servings of fruits and vegetables.  Education: All About Nutrition: -Group instruction provided by verbal, written material, interactive activities, discussions, models, and posters to present general guidelines for heart healthy nutrition including fat, fiber, MyPlate, the role of sodium in heart healthy nutrition, utilization of the nutrition label, and utilization of this knowledge for meal planning. Follow up email sent as well. Written material given at graduation. [facility]  Education need identified 09/17/22       Biometrics:  Pre Biometrics - 09/18/22 0804       Pre Biometrics   Single Leg Stand 23.06 seconds              Nutrition Therapy Plan and Nutrition Goals:  Nutrition Therapy & Goals - 09/17/22 1335       Intervention Plan   Intervention  Prescribe, educate and counsel regarding individualized specific dietary modifications aiming towards targeted core components such as weight, hypertension, lipid management, diabetes, heart failure and other comorbidities.;Nutrition handout(s) given to patient.    Expected Outcomes Short Term Goal: Understand basic principles of dietary content, such as calories, fat, sodium, cholesterol and nutrients.             Nutrition Assessments:  MEDIFICTS Score Key: ?70 Need to make  dietary changes  40-70 Heart Healthy Diet ? 40 Therapeutic Level Cholesterol Diet  Flowsheet Row Cardiac Rehab from 09/17/2022 in [facility] Cardiac and Pulmonary Rehab  Picture Your Plate Total Score on Admission 65      Picture Your Plate Scores: [id number] Unhealthy dietary pattern with much room for improvement. 41-50 Dietary pattern unlikely to meet recommendations for good health and room for improvement. 51-60 More healthful dietary pattern, with some room for improvement.  >60 Healthy dietary pattern, although there may be some specific behaviors that could be improved.    Nutrition Goals Re-Evaluation:  Nutrition Goals Re-Evaluation     [place] Name 09/30/22 0819 10/21/22 0753           Goals   Current Weight -- 172 lb (78 kg)      Nutrition Goal Meet with dietitian.  Make heart healthy changes Work on lowering sugar.      Comment Marcus Huang has already started to implement some changes with his diet.  He has been working on getting his A1c down and his at home test has it at 5.5 now versus 8 4 months ago.  He is aiming for low carbs/keto diet.  He keeps a close eye on his sugars.  He is eating a lot of vegetables and greens as well.  He does not eat as much fruit with his diabetes. Marcus Huang wants to reduce his A1C. He wants to meet with the dietitian for a lower carb diet.      Expected Outcome Short; Continue to aim for balance in diet Long: Continue to add variety Short: meet with the dietitian. Long: Reduce A1C.               Nutrition Goals Discharge (Final Nutrition Goals Re-Evaluation):  Nutrition Goals Re-Evaluation - 10/21/22 0753       Goals   Current Weight 172 lb (78 kg)    Nutrition Goal Work on lowering sugar.    Comment Marcus Huang wants to reduce his A1C. He wants to meet with the dietitian for a lower carb diet.    Expected Outcome Short: meet with the dietitian. Long: Reduce A1C.             Psychosocial: Target Goals: Acknowledge presence or absence of significant  depression and/or stress, maximize coping skills, provide positive support system. Participant is able to verbalize types and ability to use techniques and skills needed for reducing stress and depression.   Education: Stress, Anxiety, and Depression - Group verbal and visual presentation to define topics covered.  Reviews how body is impacted by stress, anxiety, and depression.  Also discusses healthy ways to reduce stress and to treat/manage anxiety and depression.  Written material given at graduation.   Education: Sleep Hygiene -Provides group verbal and written instruction about how sleep can affect your health.  Define sleep hygiene, discuss sleep cycles and impact of sleep habits. Review good sleep hygiene tips.    Initial Review & Psychosocial Screening:  Initial Psych Review & Screening - 09/09/22 1307       Initial  Review   Current issues with None Identified      Family Dynamics   Good Support System? Yes   wife, daughter, friends     Barriers   Psychosocial barriers to participate in program There are no identifiable barriers or psychosocial needs.;The patient should benefit from training in stress management and relaxation.      Screening Interventions   Interventions Encouraged to exercise;Provide feedback about the scores to participant;To provide support and resources with identified psychosocial needs    Expected Outcomes Short Term goal: Utilizing psychosocial counselor, staff and physician to assist with identification of specific Stressors or current issues interfering with healing process. Setting desired goal for each stressor or current issue identified.;Long Term Goal: Stressors or current issues are controlled or eliminated.;Long Term goal: The participant improves quality of Life and PHQ9 Scores as seen by post scores and/or verbalization of changes;Short Term goal: Identification and review with participant of any Quality of Life or Depression concerns found by  scoring the questionnaire.             Quality of Life Scores:   Quality of Life - 09/17/22 1332       Quality of Life   Select Quality of Life      Quality of Life Scores   Health/Function Pre 26.1 %    Socioeconomic Pre 24.38 %    Psych/Spiritual Pre 23.93 %    Family Pre 27.3 %    GLOBAL Pre 25.44 %            Scores of 19 and below usually indicate a poorer quality of life in these areas.  A difference of  2-3 points is a clinically meaningful difference.  A difference of 2-3 points in the total score of the Quality of Life Index has been associated with significant improvement in overall quality of life, self-image, physical symptoms, and general health in studies assessing change in quality of life.  PHQ-9: Review Flowsheet       10/09/2022 09/17/2022  Depression screen PHQ 2/9  Decreased Interest 0 0  Down, Depressed, Hopeless 1 1  PHQ - 2 Score 1 1  Altered sleeping 1 3  Tired, decreased energy 1 1  Change in appetite 0 0  Feeling bad or failure about yourself  1 1  Trouble concentrating 1 0  Moving slowly or fidgety/restless 0 1  Suicidal thoughts 0 0  PHQ-9 Score 5 7  Difficult doing work/chores Somewhat difficult Not difficult at all   Interpretation of Total Score  Total Score Depression Severity:  1-4 = Minimal depression, 5-9 = Mild depression, 10-14 = Moderate depression, 15-19 = Moderately severe depression, 20-27 = Severe depression   Psychosocial Evaluation and Intervention:  Psychosocial Evaluation - 09/09/22 1319       Psychosocial Evaluation & Interventions   Interventions Encouraged to exercise with the program and follow exercise prescription    Comments Marcus Huang is coming to cardiac rehab post stent. He is very motivated to get back to his exercise routine and continue on his journey of a heart healthy lifestyle. He has worked hard to manage his diabetes and make healthy changes. He states he has a wonderful support system of his wife,  daughter, and friends from his prayer group. He reports no stress concerns. When asked about sleep, he mentioned he has had a CPAP for 2 years and recently has stopped using it after weight loss and the stent. He is going to follow up with his doctor  soon to see if he still needs it. He is ready to start the program to learn more    Expected Outcomes Short: attend cardiac rehab for education and exercise. Long: develop and maintain positive self care habits.    Continue Psychosocial Services  Follow up required by staff             Psychosocial Re-Evaluation:  Psychosocial Re-Evaluation     Marcus Huang Name 09/30/22 0814 10/09/22 0754           Psychosocial Re-Evaluation   Current issues with Current Stress Concerns;Current Sleep Concerns Current Stress Concerns;Current Sleep Concerns      Comments Marcus Huang is off to a good start in rehab.  His health is his biggest stressor especially his heart. He has been having some pulls and strong beats in the morning and hearing his heart at night.  He is not a good sleeper in general as he wakes multiple times a night but is usually able to get back to sleep.  He usually get between 7-8 hrs of sleep each day. Reviewed patient health questionnaire (PHQ-9) with patient for follow up. Previously, patients score indicated signs/symptoms of depression.  Reviewed to see if patient is improving symptom wise while in program.  Score improved and patient states that it is because they have been feeling better. His sleep issues are chronic is nature, but they continue to cause problems.  He has sleep apnea and does not sleep well which leaves him feeling drained.  He says that his CPAP is just not working as well for him anymore.      Expected Outcomes Short: Attend rehab to learn more about his heart Long: Conitnue to cope with heart and learn new normal Short: Continue to attend [facility] regularly for regular exercise and social engagement. Long: Continue to improve  symptoms and manage a positive mental state.      Interventions Stress management education;Encouraged to attend Cardiac Rehabilitation for the exercise Stress management education;Encouraged to attend Cardiac Rehabilitation for the exercise      Continue Psychosocial Services  Follow up required by staff Follow up required by staff               Psychosocial Discharge (Final Psychosocial Re-Evaluation):  Psychosocial Re-Evaluation - 10/09/22 0754       Psychosocial Re-Evaluation   Current issues with Current Stress Concerns;Current Sleep Concerns    Comments Reviewed patient health questionnaire (PHQ-9) with patient for follow up. Previously, patients score indicated signs/symptoms of depression.  Reviewed to see if patient is improving symptom wise while in program.  Score improved and patient states that it is because they have been feeling better. His sleep issues are chronic is nature, but they continue to cause problems.  He has sleep apnea and does not sleep well which leaves him feeling drained.  He says that his CPAP is just not working as well for him anymore.    Expected Outcomes Short: Continue to attend [facility] regularly for regular exercise and social engagement. Long: Continue to improve symptoms and manage a positive mental state.    Interventions Stress management education;Encouraged to attend Cardiac Rehabilitation for the exercise    Continue Psychosocial Services  Follow up required by staff             Vocational Rehabilitation: Provide vocational rehab assistance to qualifying candidates.   Vocational Rehab Evaluation & Intervention:  Vocational Rehab - 09/09/22 1307       Initial Vocational Rehab Evaluation &  Intervention   Assessment shows need for Vocational Rehabilitation No             Education: Education Goals: Education classes will be provided on a variety of topics geared toward better understanding of heart health and risk factor  modification. Participant will state understanding/return demonstration of topics presented as noted by education test scores.  Learning Barriers/Preferences:  Learning Barriers/Preferences - 09/09/22 1307       Learning Barriers/Preferences   Learning Barriers None    Learning Preferences None             General Cardiac Education Topics:  AED/CPR: - Group verbal and written instruction with the use of models to demonstrate the basic use of the AED with the basic ABC's of resuscitation.   Anatomy and Cardiac Procedures: - Group verbal and visual presentation and models provide information about basic cardiac anatomy and function. Reviews the testing methods done to diagnose heart disease and the outcomes of the test results. Describes the treatment choices: Medical Management, Angioplasty, or Coronary Bypass Surgery for treating various heart conditions including Myocardial Infarction, Angina, Valve Disease, and Cardiac Arrhythmias.  Written material given at graduation. Flowsheet Row Cardiac Rehab from 10/30/2022 in [facility] Cardiac and Pulmonary Rehab  Education need identified 09/17/22  Date 10/23/22  Educator SB  Instruction Review Code 1- Verbalizes Understanding       Medication Safety: - Group verbal and visual instruction to review commonly prescribed medications for heart and lung disease. Reviews the medication, class of the drug, and side effects. Includes the steps to properly store meds and maintain the prescription regimen.  Written material given at graduation.   Intimacy: - Group verbal instruction through game format to discuss how heart and lung disease can affect sexual intimacy. Written material given at graduation..   Know Your Numbers and Heart Failure: - Group verbal and visual instruction to discuss disease risk factors for cardiac and pulmonary disease and treatment options.  Reviews associated critical values for Overweight/Obesity, Hypertension,  Cholesterol, and Diabetes.  Discusses basics of heart failure: signs/symptoms and treatments.  Introduces Heart Failure Zone chart for action plan for heart failure.  Written material given at graduation.   Infection Prevention: - Provides verbal and written material to individual with discussion of infection control including proper hand washing and proper equipment cleaning during exercise session. Flowsheet Row Cardiac Rehab from 10/30/2022 in [facility] Cardiac and Pulmonary Rehab  Date 09/17/22  Educator [facility]  Instruction Review Code 1- Verbalizes Understanding       Falls Prevention: - Provides verbal and written material to individual with discussion of falls prevention and safety. Flowsheet Row Cardiac Rehab from 10/30/2022 in [facility] Cardiac and Pulmonary Rehab  Date 09/17/22  Educator [facility]  Instruction Review Code 1- Verbalizes Understanding       Other: -Provides group and verbal instruction on various topics (see comments) Flowsheet Row Cardiac Rehab from 10/30/2022 in [facility] Cardiac and Pulmonary Rehab  Date 10/30/22  Educator SB  Instruction Review Code 1- Verbalizes Understanding       Knowledge Questionnaire Score:  Knowledge Questionnaire Score - 09/17/22 1332       Knowledge Questionnaire Score   Pre Score 23/26             Core Components/Risk Factors/Patient Goals at Admission:  Personal Goals and Risk Factors at Admission - 09/17/22 1335       Core Components/Risk Factors/Patient Goals on Admission    Weight Management Yes    Intervention Weight  Management: Develop a combined nutrition and exercise program designed to reach desired caloric intake, while maintaining appropriate intake of nutrient and fiber, sodium and fats, and appropriate energy expenditure required for the weight goal.;Weight Management: Provide education and appropriate resources to help participant work on and attain dietary goals.    Admit Weight 172 lb (78 kg)    Goal Weight: Short Term  170 lb (77.1 kg)    Goal Weight: Long Term 170 lb (77.1 kg)    Expected Outcomes Short Term: Continue to assess and modify interventions until short term weight is achieved;Long Term: Adherence to nutrition and physical activity/exercise program aimed toward attainment of established weight goal;Weight Maintenance: Understanding of the daily nutrition guidelines, which includes 25-35% calories from fat, 7% or less cal from saturated fats, less than 253m cholesterol, less than 1.5gm of sodium, & 5 or more servings of fruits and vegetables daily;Understanding recommendations for meals to include 15-35% energy as protein, 25-35% energy from fat, 35-60% energy from carbohydrates, less than 2053mof dietary cholesterol, 20-35 gm of total fiber daily;Understanding of distribution of calorie intake throughout the day with the consumption of 4-5 meals/snacks    Diabetes Yes    Intervention Provide education about signs/symptoms and action to take for hypo/hyperglycemia.;Provide education about proper nutrition, including hydration, and aerobic/resistive exercise prescription along with prescribed medications to achieve blood glucose in normal ranges: Fasting glucose 65-99 mg/dL    Expected Outcomes Short Term: Participant verbalizes understanding of the signs/symptoms and immediate care of hyper/hypoglycemia, proper foot care and importance of medication, aerobic/resistive exercise and nutrition plan for blood glucose control.;Long Term: Attainment of HbA1C < 7%.    Hypertension Yes    Intervention Provide education on lifestyle modifcations including regular physical activity/exercise, weight management, moderate sodium restriction and increased consumption of fresh fruit, vegetables, and low fat dairy, alcohol moderation, and smoking cessation.;Monitor prescription use compliance.    Expected Outcomes Short Term: Continued assessment and intervention until BP is < 140/9062mG in hypertensive participants. <  130/64m34m in hypertensive participants with diabetes, heart failure or chronic kidney disease.;Long Term: Maintenance of blood pressure at goal levels.             Education:Diabetes - Individual verbal and written instruction to review signs/symptoms of diabetes, desired ranges of glucose level fasting, after meals and with exercise. Acknowledge that pre and post exercise glucose checks will be done for 3 sessions at entry of program. [place]  Date 09/17/22  Educator KH  [facility] Code 1- Verbalizes Understanding       Core Components/Risk Factors/Patient Goals Review:   Goals and Risk Factor Review     Row Name 09/30/22 0821 10/21/22 0755           Core Components/Risk Factors/Patient Goals Review   Personal Goals Review Weight Management/Obesity;Hypertension;Diabetes Diabetes;Weight Management/Obesity      Review GaryDamariadoing well in rehab thus far.  He would like to lose a little more weight, but recently it has been holding steady. He is eating a keto/low carb diet with his diabetes.  His at home test has his A1c down to 5.5.  He is happy with where he is but would still like to lose more in his belly.  His pressures are doing well in class.  They are at the lowest that he has every had.  They have ranged between 130-110s for most part on top an in the 60s  on the bottom. He does note that he feels tired all the time and does not have any energy.  He was encouraged to talk to doctor. Marcus Huang wants to lose some weight and get his weight down to 160 pounds. He also wants to reduce his A1c. His A1c last time was 8.1 in the lab test. He did a home A1C test which showed 5.5. He has more labs on February 12th.      Expected Outcomes Short: Continue to keep close eye on sugars and talk to doctor about energy levels Long: COnitnue to work on loweing blood pressures Short: Change diet, lose some weight and watch  blood sugar. Long: Reach weight goal and reduce A1C.               Core Components/Risk Factors/Patient Goals at Discharge (Final Review):   Goals and Risk Factor Review - 10/21/22 0755       Core Components/Risk Factors/Patient Goals Review   Personal Goals Review Diabetes;Weight Management/Obesity    Review Marcus Huang wants to lose some weight and get his weight down to 160 pounds. He also wants to reduce his A1c. His A1c last time was 8.1 in the lab test. He did a home A1C test which showed 5.5. He has more labs on February 12th.    Expected Outcomes Short: Change diet, lose some weight and watch blood sugar. Long: Reach weight goal and reduce A1C.             ITP Comments:  ITP Comments     Row Name 09/09/22 1318 09/17/22 1244 09/18/22 0921 10/08/22 1045 11/05/22 1501   ITP Comments Initial phone call completed. Diagnosis can be found in [facility] 12/14. EP Orientation scheduled for Wednesday 1/3 at 10:30. Completed 6MWT and gym orientation. Initial ITP created and sent for review to Dr. Emily Filbert, Medical Director. First full day of exercise!  Patient was oriented to gym and equipment including functions, settings, policies, and procedures.  Patient's individual exercise prescription and treatment plan were reviewed.  All starting workloads were established based on the results of the 6 minute walk test done at initial orientation visit.  The plan for exercise progression was also introduced and progression will be customized based on patient's performance and goals. 30 Day review completed. Medical Director ITP review done, changes made as directed, and signed approval by Medical Director.    new to program 30 day review completed. ITP sent to Dr. Emily Filbert, Medical Director of Cardiac Rehab. Continue with ITP unless changes are made by physician.            Comments: 30 day review

## 2022-11-06 ENCOUNTER — Encounter: Payer: PPO | Admitting: *Deleted

## 2022-11-06 DIAGNOSIS — Z955 Presence of coronary angioplasty implant and graft: Secondary | ICD-10-CM

## 2022-11-06 NOTE — Progress Notes (Signed)
Daily Session Note  Patient Details  Name: Marcus Huang MRN: [id number] Date of Birth: 1950-10-03 Referring Provider:   Flowsheet Row Cardiac Rehab from 09/17/2022 in [facility] Cardiac and Pulmonary Rehab  Referring Provider Paraschos/Fath       Encounter Date: 11/06/2022  Check In:  Session Check In - 11/06/22 0800       Check-In   Supervising physician immediately available to respond to emergencies See telemetry face sheet for immediately available ER MD    Location [facility]-Cardiac & Pulmonary Rehab    Staff Present Darlyne Russian, RN, ADN;Jessica Luan Pulling, MA, RCEP, CCRP, Bertram Gala, MS, ACSM CEP, Exercise Physiologist;Joseph Tessie Fass, [place]    Virtual Visit No    Medication changes reported     No    Fall or balance concerns reported    No    Warm-up and Cool-down Performed on first and last piece of equipment    Resistance Training Performed Yes    VAD Patient? No    PAD/SET Patient? No      Pain Assessment   Currently in Pain? No/denies                Social History   Tobacco Use  Smoking Status Never  Smokeless Tobacco Never    Goals Met:  Independence with exercise equipment Exercise tolerated well No report of concerns or symptoms today Strength training completed today  Goals Unmet:  Not Applicable  Comments: Pt able to follow exercise prescription today without complaint.  Will continue to monitor for progression.    Dr. Emily Filbert is Medical Director for [place].  Dr. Ottie Glazier is Medical Director for [facility] Pulmonary Rehabilitation.

## 2022-11-13 ENCOUNTER — Encounter: Payer: PPO | Admitting: *Deleted

## 2022-11-13 DIAGNOSIS — G8929 Other chronic pain: Secondary | ICD-10-CM | POA: Diagnosis not present

## 2022-11-13 DIAGNOSIS — I700 Atherosclerosis of aorta: Secondary | ICD-10-CM | POA: Diagnosis not present

## 2022-11-13 DIAGNOSIS — E1165 Type 2 diabetes mellitus with hyperglycemia: Secondary | ICD-10-CM | POA: Diagnosis not present

## 2022-11-13 DIAGNOSIS — E785 Hyperlipidemia, unspecified: Secondary | ICD-10-CM | POA: Diagnosis not present

## 2022-11-13 DIAGNOSIS — I10 Essential (primary) hypertension: Secondary | ICD-10-CM | POA: Diagnosis not present

## 2022-11-13 DIAGNOSIS — Z955 Presence of coronary angioplasty implant and graft: Secondary | ICD-10-CM | POA: Diagnosis not present

## 2022-11-13 NOTE — Progress Notes (Signed)
Daily Session Note  Patient Details  Name: Marcus Huang MRN: [id number] Date of Birth: [birth year] Referring Provider:   Flowsheet Row Cardiac Rehab from 09/17/2022 in [facility] Cardiac and Pulmonary Rehab  Referring Provider Paraschos/Fath       Encounter Date: 11/13/2022  Check In:  Session Check In - 11/13/22 0758       Check-In   Supervising physician immediately available to respond to emergencies See telemetry face sheet for immediately available ER MD    Location [facility]-Cardiac & Pulmonary Rehab    Staff Present Darlyne Russian, RN, ADN;Laureen Owens Shark, BS, RRT, CPFT;Kara Maricela Bo, MS, ACSM CEP, Exercise Physiologist;Jessica Luan Pulling, MA, RCEP, CCRP, CCET    Virtual Visit No    Medication changes reported     No    Fall or balance concerns reported    No    Warm-up and Cool-down Performed on first and last piece of equipment    Resistance Training Performed Yes    VAD Patient? No    PAD/SET Patient? No      Pain Assessment   Currently in Pain? No/denies                Social History   Tobacco Use  Smoking Status Never  Smokeless Tobacco Never    Goals Met:  Independence with exercise equipment Exercise tolerated well No report of concerns or symptoms today Strength training completed today  Goals Unmet:  Not Applicable  Comments: Pt able to follow exercise prescription today without complaint.  Will continue to monitor for progression.    Dr. Emily Filbert is Medical Director for [place].  Dr. Ottie Glazier is Medical Director for [facility] Pulmonary Rehabilitation.

## 2022-11-18 ENCOUNTER — Encounter: Payer: PPO | Attending: Cardiology | Admitting: *Deleted

## 2022-11-18 DIAGNOSIS — Z955 Presence of coronary angioplasty implant and graft: Secondary | ICD-10-CM | POA: Diagnosis not present

## 2022-11-18 DIAGNOSIS — Z48812 Encounter for surgical aftercare following surgery on the circulatory system: Secondary | ICD-10-CM | POA: Diagnosis not present

## 2022-11-18 NOTE — Progress Notes (Signed)
Daily Session Note  Patient Details  Name: Marcus Huang MRN: [id number] Date of Birth: [birth year] Referring Provider:   Flowsheet Row Cardiac Rehab from 09/17/2022 in [facility] Cardiac and Pulmonary Rehab  Referring Provider Paraschos/Fath       Encounter Date: 11/18/2022  Check In:  Session Check In - 11/18/22 0836       Check-In   Supervising physician immediately available to respond to emergencies See telemetry face sheet for immediately available ER MD    Location [facility]-Cardiac & Pulmonary Rehab    Staff Present Heath Lark, RN, BSN, CCRP;Jessica [place], MA, RCEP, CCRP, Bertram Gala, MS, ACSM CEP, Exercise Physiologist    Virtual Visit No    Medication changes reported     No    Fall or balance concerns reported    No    Warm-up and Cool-down Performed on first and last piece of equipment    Resistance Training Performed Yes    VAD Patient? No    PAD/SET Patient? No      Pain Assessment   Currently in Pain? No/denies                Social History   Tobacco Use  Smoking Status Never  Smokeless Tobacco Never    Goals Met:  Independence with exercise equipment Exercise tolerated well No report of concerns or symptoms today  Goals Unmet:  Not Applicable  Comments: Pt able to follow exercise prescription today without complaint.  Will continue to monitor for progression.    Dr. Emily Filbert is Medical Director for [place].  Dr. Ottie Glazier is Medical Director for [facility] Pulmonary Rehabilitation.

## 2022-11-20 ENCOUNTER — Encounter: Payer: PPO | Admitting: *Deleted

## 2022-11-20 DIAGNOSIS — Z955 Presence of coronary angioplasty implant and graft: Secondary | ICD-10-CM

## 2022-11-20 NOTE — Progress Notes (Signed)
Daily Session Note  Patient Details  Name: Marcus Huang MRN: [id number] Date of Birth: [birth year] Referring Provider:   Flowsheet Row Cardiac Rehab from 09/17/2022 in [facility] Cardiac and Pulmonary Rehab  Referring Provider Paraschos/Fath       Encounter Date: 11/20/2022  Check In:  Session Check In - 11/20/22 0749       Check-In   Supervising physician immediately available to respond to emergencies See telemetry face sheet for immediately available ER MD    Location [facility]-Cardiac & Pulmonary Rehab    Staff Present Darlyne Russian, RN, ADN;Jessica Luan Pulling, MA, RCEP, CCRP, Bertram Gala, MS, ACSM CEP, Exercise Physiologist    Virtual Visit No    Medication changes reported     No    Fall or balance concerns reported    No    Warm-up and Cool-down Performed on first and last piece of equipment    Resistance Training Performed Yes    VAD Patient? No    PAD/SET Patient? No      Pain Assessment   Currently in Pain? No/denies                Social History   Tobacco Use  Smoking Status Never  Smokeless Tobacco Never    Goals Met:  Independence with exercise equipment Exercise tolerated well No report of concerns or symptoms today Strength training completed today  Goals Unmet:  Not Applicable  Comments: Pt able to follow exercise prescription today without complaint.  Will continue to monitor for progression.    Dr. Emily Filbert is Medical Director for [place].  Dr. Ottie Glazier is Medical Director for [facility] Pulmonary Rehabilitation.

## 2022-11-25 ENCOUNTER — Encounter: Payer: PPO | Admitting: *Deleted

## 2022-11-25 DIAGNOSIS — Z955 Presence of coronary angioplasty implant and graft: Secondary | ICD-10-CM

## 2022-11-25 NOTE — Progress Notes (Signed)
Daily Session Note  Patient Details  Name: Marcus Huang MRN: [id number] Date of Birth: October 05, 1950 Referring Provider:   Flowsheet Row Cardiac Rehab from 09/17/2022 in [facility] Cardiac and Pulmonary Rehab  Referring Provider Paraschos/Fath       Encounter Date: 11/25/2022  Check In:  Session Check In - 11/25/22 0843       Check-In   Supervising physician immediately available to respond to emergencies See telemetry face sheet for immediately available ER MD    Location [facility]-Cardiac & Pulmonary Rehab    Staff Present Heath Lark, RN, BSN, CCRP;Jessica [place], MA, RCEP, CCRP, Bertram Gala, MS, ACSM CEP, Exercise Physiologist    Virtual Visit No    Medication changes reported     No    Fall or balance concerns reported    No    Warm-up and Cool-down Performed on first and last piece of equipment    Resistance Training Performed Yes    VAD Patient? No    PAD/SET Patient? No      Pain Assessment   Currently in Pain? No/denies                Social History   Tobacco Use  Smoking Status Never  Smokeless Tobacco Never    Goals Met:  Independence with exercise equipment Exercise tolerated well No report of concerns or symptoms today  Goals Unmet:  Not Applicable  Comments: Pt able to follow exercise prescription today without complaint.  Will continue to monitor for progression.    Dr. Emily Filbert is Medical Director for [place].  Dr. Ottie Glazier is Medical Director for [facility] Pulmonary Rehabilitation.

## 2022-11-26 DIAGNOSIS — Z7901 Long term (current) use of anticoagulants: Secondary | ICD-10-CM | POA: Diagnosis not present

## 2022-11-26 DIAGNOSIS — Z469 Encounter for fitting and adjustment of unspecified device: Secondary | ICD-10-CM | POA: Diagnosis not present

## 2022-11-26 DIAGNOSIS — E119 Type 2 diabetes mellitus without complications: Secondary | ICD-10-CM | POA: Diagnosis not present

## 2022-11-26 DIAGNOSIS — Z978 Presence of other specified devices: Secondary | ICD-10-CM | POA: Diagnosis not present

## 2022-11-27 ENCOUNTER — Encounter: Payer: PPO | Admitting: *Deleted

## 2022-11-27 DIAGNOSIS — Z955 Presence of coronary angioplasty implant and graft: Secondary | ICD-10-CM | POA: Diagnosis not present

## 2022-11-27 NOTE — Progress Notes (Signed)
Daily Session Note  Patient Details  Name: Marcus Huang MRN: [id number] Date of Birth: 02-02-1951 Referring Provider:   Flowsheet Row Cardiac Rehab from 09/17/2022 in [facility] Cardiac and Pulmonary Rehab  Referring Provider Paraschos/Fath       Encounter Date: 11/27/2022  Check In:  Session Check In - 11/27/22 0749       Check-In   Supervising physician immediately available to respond to emergencies See telemetry face sheet for immediately available ER MD    Location [facility]-Cardiac & Pulmonary Rehab    Staff Present Darlyne Russian, RN, ADN;Jessica Luan Pulling, MA, RCEP, CCRP, Bertram Gala, MS, ACSM CEP, Exercise Physiologist;Joseph Tessie Fass, [place]    Virtual Visit No    Medication changes reported     No    Fall or balance concerns reported    No    Warm-up and Cool-down Performed on first and last piece of equipment    Resistance Training Performed Yes    VAD Patient? No    PAD/SET Patient? No      Pain Assessment   Currently in Pain? No/denies                Social History   Tobacco Use  Smoking Status Never  Smokeless Tobacco Never    Goals Met:  Independence with exercise equipment Exercise tolerated well No report of concerns or symptoms today Strength training completed today  Goals Unmet:  Not Applicable  Comments: Pt able to follow exercise prescription today without complaint.  Will continue to monitor for progression.    Dr. Emily Filbert is Medical Director for [facility].  Dr. Ottie Glazier is Medical Director for [facility] Pulmonary Rehabilitation.

## 2022-12-02 ENCOUNTER — Encounter: Payer: PPO | Admitting: *Deleted

## 2022-12-02 DIAGNOSIS — Z955 Presence of coronary angioplasty implant and graft: Secondary | ICD-10-CM

## 2022-12-02 NOTE — Progress Notes (Signed)
Daily Session Note  Patient Details  Name: Marcus Huang MRN: [id number] Date of Birth: Sep 26, 1950 Referring Provider:   Flowsheet Row Cardiac Rehab from 09/17/2022 in [facility] Cardiac and Pulmonary Rehab  Referring Provider Paraschos/Fath       Encounter Date: 12/02/2022  Check In:  Session Check In - 12/02/22 1034       Check-In   Supervising physician immediately available to respond to emergencies See telemetry face sheet for immediately available ER MD    Location [facility]-Cardiac & Pulmonary Rehab    Staff Present Heath Lark, RN, BSN, CCRP;Jessica [place], MA, RCEP, CCRP, Bertram Gala, MS, ACSM CEP, Exercise Physiologist    Virtual Visit No    Medication changes reported     No    Fall or balance concerns reported    No    Warm-up and Cool-down Performed on first and last piece of equipment    Resistance Training Performed Yes    VAD Patient? No    PAD/SET Patient? No      Pain Assessment   Currently in Pain? No/denies                Social History   Tobacco Use  Smoking Status Never  Smokeless Tobacco Never    Goals Met:  Independence with exercise equipment Exercise tolerated well No report of concerns or symptoms today  Goals Unmet:  Not Applicable  Comments: Pt able to follow exercise prescription today without complaint.  Will continue to monitor for progression.    Dr. Emily Filbert is Medical Director for [place].  Dr. Ottie Glazier is Medical Director for [facility] Pulmonary Rehabilitation.

## 2022-12-03 ENCOUNTER — Encounter: Payer: Self-pay | Admitting: *Deleted

## 2022-12-03 DIAGNOSIS — Z955 Presence of coronary angioplasty implant and graft: Secondary | ICD-10-CM

## 2022-12-03 NOTE — Progress Notes (Signed)
Cardiac Individual Treatment Plan  Patient Details  Name: Marcus Huang MRN: [id number] Date of Birth: Jan 04, 1951 Referring Provider:   Flowsheet Row Cardiac Rehab from 09/17/2022 in [facility] Cardiac and Pulmonary Rehab  Referring Provider Paraschos/Fath       Initial Encounter Date:  Flowsheet Row Cardiac Rehab from 09/17/2022 in [facility] Cardiac and Pulmonary Rehab  Date 09/17/22       Visit Diagnosis: Status post coronary artery stent placement  Patient's Home Medications on Admission:  Current Outpatient Medications:    amLODipine (NORVASC) 2.5 MG tablet, Take 1 tablet (2.5 mg total) by mouth in the morning and at bedtime., Disp: 60 tablet, Rfl: 11   aspirin EC 81 MG tablet, Take 1 tablet (81 mg total) by mouth daily at 12 noon., Disp: 30 tablet, Rfl: 2   atorvastatin (LIPITOR) 80 MG tablet, Take 1 tablet (80 mg total) by mouth at bedtime., Disp: 30 tablet, Rfl: 11   azelastine (ASTELIN) 0.1 % nasal spray, Place into the nose. PRN, Disp: , Rfl:    clopidogrel (PLAVIX) 75 MG tablet, Take 1 tablet (75 mg total) by mouth daily., Disp: 30 tablet, Rfl: 11   hydrochlorothiazide (HYDRODIURIL) 25 MG tablet, Take 1 tablet (25 mg total) by mouth daily., Disp: 30 tablet, Rfl: 11   metoprolol tartrate (LOPRESSOR) 25 MG tablet, Take 0.5 tablets (12.5 mg total) by mouth 2 (two) times daily., Disp: 30 tablet, Rfl: 11   Multiple Vitamin (MULTIVITAMIN) tablet, Take 1 tablet by mouth daily., Disp: , Rfl:    pioglitazone (ACTOS) 30 MG tablet, Take 30 mg by mouth daily., Disp: , Rfl:    PREVIDENT 5000 SENSITIVE 1.1-5 % GEL, Place onto teeth as directed., Disp: , Rfl:    traMADol (ULTRAM) 50 MG tablet, TAKE 1 TABLET(50 MG) BY MOUTH EVERY 6 HOURS AS NEEDED FOR PAIN, Disp: , Rfl:    VITAMIN D PO, Take 5,000 Units by mouth daily., Disp: , Rfl:   Past Medical History: Past Medical History:  Diagnosis Date   Carotid artery thrombosis    Left Carotid Artery   Coronary artery disease    Diabetes (HCC)     Diverticulosis    GERD (gastroesophageal reflux disease)    History of kidney stones    Hyperlipidemia    Hypertension    Renal mass    Sleep apnea    C-PAP    Tobacco Use: Social History   Tobacco Use  Smoking Status Never  Smokeless Tobacco Never    Labs: Review Flowsheet        No data to display           Exercise Target Goals: Exercise Program Goal: Individual exercise prescription set using results from initial 6 min walk test and THRR while considering  patient's activity barriers and safety.   Exercise Prescription Goal: Initial exercise prescription builds to 30-45 minutes a day of aerobic activity, 2-3 days per week.  Home exercise guidelines will be given to patient during program as part of exercise prescription that the participant will acknowledge.   Education: Aerobic Exercise: - Group verbal and visual presentation on the components of exercise prescription. Introduces F.I.T.T principle from ACSM for exercise prescriptions.  Reviews F.I.T.T. principles of aerobic exercise including progression. Written material given at graduation.   Education: Resistance Exercise: - Group verbal and visual presentation on the components of exercise prescription. Introduces F.I.T.T principle from ACSM for exercise prescriptions  Reviews F.I.T.T. principles of resistance exercise including progression. Written material given at graduation. Flowsheet  Row Cardiac Rehab from 11/06/2022 in [facility] Cardiac and Pulmonary Rehab  Date 10/09/22  Educator [facility]  Instruction Review Code 1- Verbalizes Understanding        Education: Exercise & Equipment Safety: - Individual verbal instruction and demonstration of equipment use and safety with use of the equipment. Flowsheet Row Cardiac Rehab from 11/06/2022 in [facility] Cardiac and Pulmonary Rehab  Date 09/17/22  Educator [facility]  Instruction Review Code 1- Verbalizes Understanding       Education: Exercise Physiology & General Exercise  Guidelines: - Group verbal and written instruction with models to review the exercise physiology of the cardiovascular system and associated critical values. Provides general exercise guidelines with specific guidelines to those with heart or lung disease.    Education: Flexibility, Balance, Mind/Body Relaxation: - Group verbal and visual presentation with interactive activity on the components of exercise prescription. Introduces F.I.T.T principle from ACSM for exercise prescriptions. Reviews F.I.T.T. principles of flexibility and balance exercise training including progression. Also discusses the mind body connection.  Reviews various relaxation techniques to help reduce and manage stress (i.e. Deep breathing, progressive muscle relaxation, and visualization). Balance handout provided to take home. Written material given at graduation. Flowsheet Row Cardiac Rehab from 11/06/2022 in [facility] Cardiac and Pulmonary Rehab  Date 10/16/22  Educator [facility]  Instruction Review Code 1- Verbalizes Understanding       Activity Barriers & Risk Stratification:  Activity Barriers & Cardiac Risk Stratification - 09/17/22 1248       Activity Barriers & Cardiac Risk Stratification   Activity Barriers None    Cardiac Risk Stratification Moderate             6 Minute Walk:  6 Minute Walk     Row Name 09/17/22 1246         6 Minute Walk   Phase Initial     Distance 1300 feet     Walk Time 6 minutes     # of Rest Breaks 0     MPH 2.46     METS 3.14     RPE 11     Perceived Dyspnea  0     VO2 Peak 10.98     Symptoms No     Resting HR 62 bpm     Resting BP 148/82     Resting Oxygen Saturation  99 %     Exercise Oxygen Saturation  during 6 min walk 98 %     Max Ex. HR 103 bpm     Max Ex. BP 138/62     2 Minute Post BP 124/72              Oxygen Initial Assessment:   Oxygen Re-Evaluation:   Oxygen Discharge (Final Oxygen Re-Evaluation):   Initial Exercise Prescription:  Initial  Exercise Prescription - 09/17/22 1200       Date of Initial Exercise RX and Referring Provider   Date 09/17/22    Referring Provider Paraschos/Fath      Oxygen   Maintain Oxygen Saturation 88% or higher      Treadmill   MPH 2.5    Grade 1    Minutes 15    METs 3.26      Elliptical   Level 1    Speed 3    Minutes 1    METs 3.14      Prescription Details   Frequency (times per week) 2    Duration Progress to 30 minutes of continuous aerobic without signs/symptoms of  physical distress      Intensity   THRR 40-80% of Max Heartrate 97-133    Ratings of Perceived Exertion 11-13    Perceived Dyspnea 0-4      Progression   Progression Continue to progress workloads to maintain intensity without signs/symptoms of physical distress.      Resistance Training   Training Prescription Yes    Weight 5    Reps 10-15             Perform Capillary Blood Glucose checks as needed.  Exercise Prescription Changes:   Exercise Prescription Changes     Row Name 09/17/22 1200 09/29/22 0900 09/30/22 0800 10/13/22 1300 10/27/22 0900     Response to Exercise   Blood Pressure (Admit) 148/82 130/64 -- 144/72 102/60   Blood Pressure (Exercise) 138/62 154/64 -- 148/74 134/66   Blood Pressure (Exit) 124/72 124/64 -- 138/70 100/60   Heart Rate (Admit) 62 bpm 70 bpm -- 74 bpm 61 bpm   Heart Rate (Exercise) 103 bpm 103 bpm -- 123 bpm 116 bpm   Heart Rate (Exit) 64 bpm 66 bpm -- 82 bpm 60 bpm   Oxygen Saturation (Admit) 99 % -- -- -- --   Oxygen Saturation (Exercise) 98 % -- -- -- --   Oxygen Saturation (Exit) 98 % -- -- -- --   Rating of Perceived Exertion (Exercise) 11 13 -- 12 14   Perceived Dyspnea (Exercise) 0 -- -- -- --   Symptoms none none -- Stated that "legs feel like jello" none   Comments 6 MWT results 2nd full day of exercise -- -- --   Duration -- Continue with 30 min of aerobic exercise without signs/symptoms of physical distress. -- Continue with 30 min of aerobic  exercise without signs/symptoms of physical distress. Continue with 30 min of aerobic exercise without signs/symptoms of physical distress.   Intensity -- THRR unchanged -- THRR unchanged THRR unchanged     Progression   Progression -- Continue to progress workloads to maintain intensity without signs/symptoms of physical distress. -- Continue to progress workloads to maintain intensity without signs/symptoms of physical distress. Continue to progress workloads to maintain intensity without signs/symptoms of physical distress.   Average METs -- 3.21 -- 4.86 4.97     Resistance Training   Training Prescription -- Yes -- Yes Yes   Weight -- 5 lb -- 5 lb 6 lb   Reps -- 10-15 -- 10-15 10-15     Interval Training   Interval Training -- No -- No Yes   Equipment -- -- -- -- Treadmill;Recumbant Elliptical   Comments -- -- -- -- incline vary 4-8%; L 2.5-5     Treadmill   MPH -- 3.1 -- 3.2 3.4  intervals   Grade -- 3 -- 6 8   Minutes -- 15 -- 15 15   METs -- 4.66 -- 6.1 6.8     Recumbant Elliptical   Level -- 2.3 -- 3 5  intervals   Minutes -- 15 -- 15 15   METs -- 1.9 -- -- 3.7     Elliptical   Level -- 1 -- 1 --   Speed -- 2.9 -- 4 --   Minutes -- 15 -- 15 --   METs -- 2.7 -- 3.2 --     REL-XR   Level -- -- -- 8 8   Minutes -- -- -- 15 15   METs -- -- -- 5 4.4     Home Exercise Plan  Plans to continue exercise at -- -- Home (comment)  walking at home Home (comment)  walking at home Home (comment)  walking at home   Frequency -- -- Add 2 additional days to program exercise sessions. Add 2 additional days to program exercise sessions. Add 2 additional days to program exercise sessions.   Initial Home Exercises Provided -- -- 09/30/22 09/30/22 09/30/22     Oxygen   Maintain Oxygen Saturation -- 88% or higher -- 88% or higher 88% or higher    Row Name 11/10/22 1300 11/24/22 1100           Response to Exercise   Blood Pressure (Admit) 120/68 132/68      Blood Pressure  (Exercise) 136/72 --      Blood Pressure (Exit) 138/78 112/66      Heart Rate (Admit) 62 bpm 52 bpm      Heart Rate (Exercise) 108 bpm 97 bpm      Heart Rate (Exit) 72 bpm 58 bpm      Rating of Perceived Exertion (Exercise) 13 13      Symptoms none none      Duration Continue with 30 min of aerobic exercise without signs/symptoms of physical distress. Continue with 30 min of aerobic exercise without signs/symptoms of physical distress.      Intensity THRR unchanged THRR unchanged        Progression   Progression Continue to progress workloads to maintain intensity without signs/symptoms of physical distress. Continue to progress workloads to maintain intensity without signs/symptoms of physical distress.      Average METs 4.74 4.34        Resistance Training   Training Prescription Yes Yes      Weight 8 lb 10 lb      Reps 10-15 10-15        Interval Training   Interval Training Yes Yes      Equipment Treadmill Treadmill      Comments speed 3.2-4.5 mph up to 6% incline\        Treadmill   MPH 4.5 3.2      Grade 5.5 6.5      Minutes 15 15      METs 7.86 6.3        Recumbant Elliptical   Level 3 3.5      Minutes 15 15      METs 2.2 2.4        Elliptical   Level 4 3      Speed 4 4      Minutes 15 15        REL-XR   Level 8 6      Minutes 15 15      METs 5.6 --        Home Exercise Plan   Plans to continue exercise at Home (comment)  walking at home Home (comment)  walking at home      Frequency Add 2 additional days to program exercise sessions. Add 2 additional days to program exercise sessions.      Initial Home Exercises Provided 09/30/22 09/30/22        Oxygen   Maintain Oxygen Saturation 88% or higher 88% or higher               Exercise Comments:   Exercise Comments     Row Name 09/18/22 [id number]           Exercise Comments First full day of exercise!  Patient was oriented  to gym and equipment including functions, settings, policies, and procedures.   Patient's individual exercise prescription and treatment plan were reviewed.  All starting workloads were established based on the results of the 6 minute walk test done at initial orientation visit.  The plan for exercise progression was also introduced and progression will be customized based on patient's performance and goals.                Exercise Goals and Review:   Exercise Goals     Row Name 09/17/22 1252             Exercise Goals   Increase Physical Activity Yes       Intervention Provide advice, education, support and counseling about physical activity/exercise needs.;Develop an individualized exercise prescription for aerobic and resistive training based on initial evaluation findings, risk stratification, comorbidities and participant's personal goals.       Expected Outcomes Short Term: Attend rehab on a regular basis to increase amount of physical activity.;Long Term: Add in home exercise to make exercise part of routine and to increase amount of physical activity.;Long Term: Exercising regularly at least 3-5 days a week.       Increase Strength and Stamina Yes       Intervention Provide advice, education, support and counseling about physical activity/exercise needs.;Develop an individualized exercise prescription for aerobic and resistive training based on initial evaluation findings, risk stratification, comorbidities and participant's personal goals.       Expected Outcomes Short Term: Increase workloads from initial exercise prescription for resistance, speed, and METs.;Short Term: Perform resistance training exercises routinely during rehab and add in resistance training at home;Long Term: Improve cardiorespiratory fitness, muscular endurance and strength as measured by increased METs and functional capacity (6MWT)       Able to understand and use rate of perceived exertion (RPE) scale Yes       Intervention Provide education and explanation on how to use RPE scale        Expected Outcomes Short Term: Able to use RPE daily in rehab to express subjective intensity level;Long Term:  Able to use RPE to guide intensity level when exercising independently       Able to understand and use Dyspnea scale Yes       Intervention Provide education and explanation on how to use Dyspnea scale       Expected Outcomes Short Term: Able to use Dyspnea scale daily in rehab to express subjective sense of shortness of breath during exertion;Long Term: Able to use Dyspnea scale to guide intensity level when exercising independently       Knowledge and understanding of Target Heart Rate Range (THRR) Yes       Intervention Provide education and explanation of THRR including how the numbers were predicted and where they are located for reference       Expected Outcomes Short Term: Able to state/look up THRR;Long Term: Able to use THRR to govern intensity when exercising independently;Short Term: Able to use daily as guideline for intensity in rehab       Able to check pulse independently Yes       Intervention Provide education and demonstration on how to check pulse in carotid and radial arteries.;Review the importance of being able to check your own pulse for safety during independent exercise       Expected Outcomes Short Term: Able to explain why pulse checking is important during independent exercise       Understanding  of Exercise Prescription Yes       Intervention Provide education, explanation, and written materials on patient's individual exercise prescription       Expected Outcomes Short Term: Able to explain program exercise prescription;Long Term: Able to explain home exercise prescription to exercise independently                Exercise Goals Re-Evaluation :  Exercise Goals Re-Evaluation     Row Name 09/18/22 [id number] 09/29/22 0934 09/30/22 0813 10/13/22 1344 10/27/22 0922     Exercise Goal Re-Evaluation   Exercise Goals Review Able to understand and use rate of  perceived exertion (RPE) scale;Able to understand and use Dyspnea scale;Knowledge and understanding of Target Heart Rate Range (THRR);Understanding of Exercise Prescription Increase Physical Activity;Increase Strength and Stamina;Understanding of Exercise Prescription Increase Physical Activity;Increase Strength and Stamina;Understanding of Exercise Prescription;Able to understand and use Dyspnea scale;Able to check pulse independently;Knowledge and understanding of Target Heart Rate Range (THRR);Able to understand and use rate of perceived exertion (RPE) scale Increase Physical Activity;Increase Strength and Stamina;Understanding of Exercise Prescription Increase Physical Activity;Increase Strength and Stamina;Understanding of Exercise Prescription   Comments Reviewed RPE scale, THR and program prescription with pt today.  Pt voiced understanding and was given a copy of goals to take home. Marcus Huang is off to a good start with rehab for the first couple of sessions he has been here . He was able to exercise on the elliptical well at about a 3 mph speed. He also increased his workload on the treadmill to a 3.1 mph/ 3% already. RPEs are at an appropriate level. Will continue to monitor as he progresses in the program. Marcus Huang is on day three thus far for rehab. He is doing well with his workloads and starting to bring them up slowly.  He tried out the elliptical for the first time today.  Reviewed home exercise with pt today.  Pt plans to walk at home for exercise.  Reviewed THR, pulse, RPE, sign and symptoms, pulse oximetery and when to call 911 or MD.  Also discussed weather considerations and indoor options.  Pt voiced understanding. Marcus Huang continues to do well in rehab. He recently increased his overall average MET level to 4.86 METs. He also increased his treadmill workload to a speed of 3.2 mph and an incline of 6%. He improved to level 3 on the REL and level 8 on the XR as well. We will continue to monitor his  progress. Marcus Huang continues to do well in rehab. He started doing intervals on the treadmill, working from 4-8% incline, working almost up to 7 METS! He also started intervals on the REL varying his workload from level 2.5-5. He is now using 6 lbs for handweights. We will continue to monitor.   Expected Outcomes Short: Use RPE daily to regulate intensity. Long: Follow program prescription in THR. Short: Continue to work up endurance on elliptical Long: Continue to increase overall MET level and stamina Short: Start to add in more walking at home to build back up.   long: Continue to attend rehab regularly Short: Continue to increae treadmill workload. Long: Continue to improve strength and stamina. Short: Continue intervals on both treadmill and REL Long: Continue to increase overall MET level and stamina    Row Name 11/10/22 1322 11/20/22 0754 11/24/22 1135         Exercise Goal Re-Evaluation   Exercise Goals Review Increase Physical Activity;Increase Strength and Stamina;Understanding of Exercise Prescription Increase Physical Activity;Increase Strength and Stamina;Understanding of  Exercise Prescription Increase Physical Activity;Increase Strength and Stamina;Understanding of Exercise Prescription     Comments Marcus Huang is doing well in rehab. He has increased his intervals on the treadmill working a t a speed ranging from 3.2-4.5 mph with a 5.5% incline. He also improved to level 4 on the elliptical at a speed of 4 mph. He increased from 6 lb to 8 b hand weights for resistance training as well. We will continue to monitor his progress in the program. Marcus Huang is doing well in rehab.  He is already going back to [employer] to do some lifting.  He is also using the treadmill at home and will usually use elliptical at gym. He is really wanting to build up his leg strength.  We talked about different exercises that he could do to help it out.  He is going to try reverse lunge  and stair mill to help.  He feels that  his upper body and cardio are doing much better. Marcus Huang continues to do well in rehab. He increased to using 10 lb for his handweights. He continues to do intervals on the treadmill and increased his max incline to to 6.5%. He is enjoying the ellipitcal as he feels it is helping increase his lower leg muscles. He also increased to 3.5 level on the REL. We will continue to monitor.     Expected Outcomes Short: Continue to do intervals on the treadmill. Long: Continue to improve strength and stamina. Short: Try reverse lunges Long: Conitnue to improve stamina Short: Continue to increase intervals on treadmill Long: Continue to increase overall MET level and stamina              Discharge Exercise Prescription (Final Exercise Prescription Changes):  Exercise Prescription Changes - 11/24/22 1100       Response to Exercise   Blood Pressure (Admit) 132/68    Blood Pressure (Exit) 112/66    Heart Rate (Admit) 52 bpm    Heart Rate (Exercise) 97 bpm    Heart Rate (Exit) 58 bpm    Rating of Perceived Exertion (Exercise) 13    Symptoms none    Duration Continue with 30 min of aerobic exercise without signs/symptoms of physical distress.    Intensity THRR unchanged      Progression   Progression Continue to progress workloads to maintain intensity without signs/symptoms of physical distress.    Average METs 4.34      Resistance Training   Training Prescription Yes    Weight 10 lb    Reps 10-15      Interval Training   Interval Training Yes    Equipment Treadmill    Comments up to 6% incline\      Treadmill   MPH 3.2    Grade 6.5    Minutes 15    METs 6.3      Recumbant Elliptical   Level 3.5    Minutes 15    METs 2.4      Elliptical   Level 3    Speed 4    Minutes 15      REL-XR   Level 6    Minutes 15      Home Exercise Plan   Plans to continue exercise at Home (comment)   walking at home   Frequency Add 2 additional days to program exercise sessions.    Initial Home  Exercises Provided 09/30/22      Oxygen   Maintain Oxygen Saturation 88% or higher  Nutrition:  Target Goals: Understanding of nutrition guidelines, daily intake of sodium 1500mg , cholesterol 200mg , calories 30% from fat and 7% or less from saturated fats, daily to have 5 or more servings of fruits and vegetables.  Education: All About Nutrition: -Group instruction provided by verbal, written material, interactive activities, discussions, models, and posters to present general guidelines for heart healthy nutrition including fat, fiber, MyPlate, the role of sodium in heart healthy nutrition, utilization of the nutrition label, and utilization of this knowledge for meal planning. Follow up email sent as well. Written material given at graduation. Flowsheet Row Cardiac Rehab from 11/06/2022 in [facility] Cardiac and Pulmonary Rehab  Education need identified 09/17/22       Biometrics:  Pre Biometrics - 09/18/22 0804       Pre Biometrics   Single Leg Stand 23.06 seconds              Nutrition Therapy Plan and Nutrition Goals:  Nutrition Therapy & Goals - 11/13/22 0832       Nutrition Therapy   RD appointment deferred Yes   Marcus Huang would not like to meet with RD at this time as he reports he is doing well with his diet and has made many changes so far.     Personal Nutrition Goals   Nutrition Goal Marcus Huang would not like to meet with RD at this time as he reports he is doing well with his diet and has made many changes so far.             Nutrition Assessments:  MEDIFICTS Score Key: ?70 Need to make dietary changes  40-70 Heart Healthy Diet ? 40 Therapeutic Level Cholesterol Diet  Flowsheet Row Cardiac Rehab from 09/17/2022 in [facility] Cardiac and Pulmonary Rehab  Picture Your Plate Total Score on Admission 65      Picture Your Plate Scores: [id number] Unhealthy dietary pattern with much room for improvement. 41-50 Dietary pattern unlikely to meet recommendations for  good health and room for improvement. 51-60 More healthful dietary pattern, with some room for improvement.  >60 Healthy dietary pattern, although there may be some specific behaviors that could be improved.    Nutrition Goals Re-Evaluation:  Nutrition Goals Re-Evaluation     [place] Name 09/30/22 0819 10/21/22 0753 11/20/22 0758         Goals   Current Weight -- 172 lb (78 kg) --     Nutrition Goal Meet with dietitian.  Make heart healthy changes Work on lowering sugar. Marcus Huang would not like to meet with RD at this time as he reports he is doing well with his diet and has made many changes so far.     Comment Marcus Huang has already started to implement some changes with his diet.  He has been working on getting his A1c down and his at home test has it at 5.5 now versus 8 4 months ago.  He is aiming for low carbs/keto diet.  He keeps a close eye on his sugars.  He is eating a lot of vegetables and greens as well.  He does not eat as much fruit with his diabetes. Marcus Huang wants to reduce his A1C. He wants to meet with the dietitian for a lower carb diet. Marcus Huang feels that his diet is doing well.  He feels good grasp on what to do and what to eat.  He continues to get in a lot of variety.     Expected Outcome Short; Continue to aim for balance in  diet Long: Continue to add variety Short: meet with the dietitian. Long: Reduce A1C. Short: Continued variety Long: Continue to focus on heart healthy eating              Nutrition Goals Discharge (Final Nutrition Goals Re-Evaluation):  Nutrition Goals Re-Evaluation - 11/20/22 0758       Goals   Nutrition Goal Marcus Huang would not like to meet with RD at this time as he reports he is doing well with his diet and has made many changes so far.    Comment Marcus Huang feels that his diet is doing well.  He feels good grasp on what to do and what to eat.  He continues to get in a lot of variety.    Expected Outcome Short: Continued variety Long: Continue to focus on heart healthy  eating             Psychosocial: Target Goals: Acknowledge presence or absence of significant depression and/or stress, maximize coping skills, provide positive support system. Participant is able to verbalize types and ability to use techniques and skills needed for reducing stress and depression.   Education: Stress, Anxiety, and Depression - Group verbal and visual presentation to define topics covered.  Reviews how body is impacted by stress, anxiety, and depression.  Also discusses healthy ways to reduce stress and to treat/manage anxiety and depression.  Written material given at graduation.   Education: Sleep Hygiene -Provides group verbal and written instruction about how sleep can affect your health.  Define sleep hygiene, discuss sleep cycles and impact of sleep habits. Review good sleep hygiene tips.    Initial Review & Psychosocial Screening:  Initial Psych Review & Screening - 09/09/22 1307       Initial Review   Current issues with None Identified      Family Dynamics   Good Support System? Yes   wife, daughter, friends     Barriers   Psychosocial barriers to participate in program There are no identifiable barriers or psychosocial needs.;The patient should benefit from training in stress management and relaxation.      Screening Interventions   Interventions Encouraged to exercise;Provide feedback about the scores to participant;To provide support and resources with identified psychosocial needs    Expected Outcomes Short Term goal: Utilizing psychosocial counselor, staff and physician to assist with identification of specific Stressors or current issues interfering with healing process. Setting desired goal for each stressor or current issue identified.;Long Term Goal: Stressors or current issues are controlled or eliminated.;Long Term goal: The participant improves quality of Life and PHQ9 Scores as seen by post scores and/or verbalization of changes;Short Term  goal: Identification and review with participant of any Quality of Life or Depression concerns found by scoring the questionnaire.             Quality of Life Scores:   Quality of Life - 09/17/22 1332       Quality of Life   Select Quality of Life      Quality of Life Scores   Health/Function Pre 26.1 %    Socioeconomic Pre 24.38 %    Psych/Spiritual Pre 23.93 %    Family Pre 27.3 %    GLOBAL Pre 25.44 %            Scores of 19 and below usually indicate a poorer quality of life in these areas.  A difference of  2-3 points is a clinically meaningful difference.  A difference of 2-3 points in  the total score of the Quality of Life Index has been associated with significant improvement in overall quality of life, self-image, physical symptoms, and general health in studies assessing change in quality of life.  PHQ-9: Review Flowsheet       11/06/2022 10/09/2022 09/17/2022  Depression screen PHQ 2/9  Decreased Interest 0 0 0  Down, Depressed, Hopeless 0 1 1  PHQ - 2 Score 0 1 1  Altered sleeping 0 1 3  Tired, decreased energy 1 1 1   Change in appetite 0 0 0  Feeling bad or failure about yourself  0 1 1  Trouble concentrating 1 1 0  Moving slowly or fidgety/restless 1 0 1  Suicidal thoughts 0 0 0  PHQ-9 Score 3 5 7   Difficult doing work/chores Not difficult at all Somewhat difficult Not difficult at all   Interpretation of Total Score  Total Score Depression Severity:  1-4 = Minimal depression, 5-9 = Mild depression, 10-14 = Moderate depression, 15-19 = Moderately severe depression, 20-27 = Severe depression   Psychosocial Evaluation and Intervention:  Psychosocial Evaluation - 09/09/22 1319       Psychosocial Evaluation & Interventions   Interventions Encouraged to exercise with the program and follow exercise prescription    Comments Marcus Huang is coming to cardiac rehab post stent. He is very motivated to get back to his exercise routine and continue on his journey of a  heart healthy lifestyle. He has worked hard to manage his diabetes and make healthy changes. He states he has a wonderful support system of his wife, daughter, and friends from his prayer group. He reports no stress concerns. When asked about sleep, he mentioned he has had a CPAP for 2 years and recently has stopped using it after weight loss and the stent. He is going to follow up with his doctor soon to see if he still needs it. He is ready to start the program to learn more    Expected Outcomes Short: attend cardiac rehab for education and exercise. Long: develop and maintain positive self care habits.    Continue Psychosocial Services  Follow up required by staff             Psychosocial Re-Evaluation:  Psychosocial Re-Evaluation     Row Name 09/30/22 0814 10/09/22 0754 11/20/22 0757         Psychosocial Re-Evaluation   Current issues with Current Stress Concerns;Current Sleep Concerns Current Stress Concerns;Current Sleep Concerns Current Sleep Concerns     Comments Marcus Huang is off to a good start in rehab.  His health is his biggest stressor especially his heart. He has been having some pulls and strong beats in the morning and hearing his heart at night.  He is not a good sleeper in general as he wakes multiple times a night but is usually able to get back to sleep.  He usually get between 7-8 hrs of sleep each day. Reviewed patient health questionnaire (PHQ-9) with patient for follow up. Previously, patients score indicated signs/symptoms of depression.  Reviewed to see if patient is improving symptom wise while in program.  Score improved and patient states that it is because they have been feeling better. His sleep issues are chronic is nature, but they continue to cause problems.  He has sleep apnea and does not sleep well which leaves him feeling drained.  He says that his CPAP is just not working as well for him anymore. Marcus Huang is doing well in rehab.  He is feeling  good mentally and  already going to gym to lift on off days.  He feels that he is really benefiting from the program and in the best shape he has been in over the last 20 years.  He continues to struggle with sleep, but it has always been that way.  He sleeps the best that he can.     Expected Outcomes Short: Attend rehab to learn more about his heart Long: Conitnue to cope with heart and learn new normal Short: Continue to attend [facility] regularly for regular exercise and social engagement. Long: Continue to improve symptoms and manage a positive mental state. Short: Continue to exercise for energy boost and sleep help Long: Conitnue to stay positive     Interventions Stress management education;Encouraged to attend Cardiac Rehabilitation for the exercise Stress management education;Encouraged to attend Cardiac Rehabilitation for the exercise Stress management education;Encouraged to attend Cardiac Rehabilitation for the exercise     Continue Psychosocial Services  Follow up required by staff Follow up required by staff Follow up required by staff              Psychosocial Discharge (Final Psychosocial Re-Evaluation):  Psychosocial Re-Evaluation - 11/20/22 0757       Psychosocial Re-Evaluation   Current issues with Current Sleep Concerns    Comments Marcus Huang is doing well in rehab.  He is feeling good mentally and already going to gym to lift on off days.  He feels that he is really benefiting from the program and in the best shape he has been in over the last 20 years.  He continues to struggle with sleep, but it has always been that way.  He sleeps the best that he can.    Expected Outcomes Short: Continue to exercise for energy boost and sleep help Long: Conitnue to stay positive    Interventions Stress management education;Encouraged to attend Cardiac Rehabilitation for the exercise    Continue Psychosocial Services  Follow up required by staff             Vocational Rehabilitation: Provide vocational  rehab assistance to qualifying candidates.   Vocational Rehab Evaluation & Intervention:  Vocational Rehab - 09/09/22 1307       Initial Vocational Rehab Evaluation & Intervention   Assessment shows need for Vocational Rehabilitation No             Education: Education Goals: Education classes will be provided on a variety of topics geared toward better understanding of heart health and risk factor modification. Participant will state understanding/return demonstration of topics presented as noted by education test scores.  Learning Barriers/Preferences:  Learning Barriers/Preferences - 09/09/22 1307       Learning Barriers/Preferences   Learning Barriers None    Learning Preferences None             General Cardiac Education Topics:  AED/CPR: - Group verbal and written instruction with the use of models to demonstrate the basic use of the AED with the basic ABC's of resuscitation.   Anatomy and Cardiac Procedures: - Group verbal and visual presentation and models provide information about basic cardiac anatomy and function. Reviews the testing methods done to diagnose heart disease and the outcomes of the test results. Describes the treatment choices: Medical Management, Angioplasty, or Coronary Bypass Surgery for treating various heart conditions including Myocardial Infarction, Angina, Valve Disease, and Cardiac Arrhythmias.  Written material given at graduation. Flowsheet Row Cardiac Rehab from 11/06/2022 in [facility] Cardiac and Pulmonary Rehab  Education  need identified 09/17/22  Date 10/23/22  Educator SB  Instruction Review Code 1- Verbalizes Understanding       Medication Safety: - Group verbal and visual instruction to review commonly prescribed medications for heart and lung disease. Reviews the medication, class of the drug, and side effects. Includes the steps to properly store meds and maintain the prescription regimen.  Written material given at  graduation. Flowsheet Row Cardiac Rehab from 11/06/2022 in [facility] Cardiac and Pulmonary Rehab  Date 11/06/22  Educator [facility]  Instruction Review Code 1- Verbalizes Understanding       Intimacy: - Group verbal instruction through game format to discuss how heart and lung disease can affect sexual intimacy. Written material given at graduation..   Know Your Numbers and Heart Failure: - Group verbal and visual instruction to discuss disease risk factors for cardiac and pulmonary disease and treatment options.  Reviews associated critical values for Overweight/Obesity, Hypertension, Cholesterol, and Diabetes.  Discusses basics of heart failure: signs/symptoms and treatments.  Introduces Heart Failure Zone chart for action plan for heart failure.  Written material given at graduation.   Infection Prevention: - Provides verbal and written material to individual with discussion of infection control including proper hand washing and proper equipment cleaning during exercise session. Flowsheet Row Cardiac Rehab from 11/06/2022 in [facility] Cardiac and Pulmonary Rehab  Date 09/17/22  Educator [facility]  Instruction Review Code 1- Verbalizes Understanding       Falls Prevention: - Provides verbal and written material to individual with discussion of falls prevention and safety. Flowsheet Row Cardiac Rehab from 11/06/2022 in [facility] Cardiac and Pulmonary Rehab  Date 09/17/22  Educator [facility]  Instruction Review Code 1- Verbalizes Understanding       Other: -Provides group and verbal instruction on various topics (see comments) Flowsheet Row Cardiac Rehab from 11/06/2022 in [facility] Cardiac and Pulmonary Rehab  Date 10/30/22  Educator SB  Instruction Review Code 1- Verbalizes Understanding       Knowledge Questionnaire Score:  Knowledge Questionnaire Score - 09/17/22 1332       Knowledge Questionnaire Score   Pre Score 23/26             Core Components/Risk Factors/Patient Goals at Admission:   Personal Goals and Risk Factors at Admission - 09/17/22 1335       Core Components/Risk Factors/Patient Goals on Admission    Weight Management Yes    Intervention Weight Management: Develop a combined nutrition and exercise program designed to reach desired caloric intake, while maintaining appropriate intake of nutrient and fiber, sodium and fats, and appropriate energy expenditure required for the weight goal.;Weight Management: Provide education and appropriate resources to help participant work on and attain dietary goals.    Admit Weight 172 lb (78 kg)    Goal Weight: Short Term 170 lb (77.1 kg)    Goal Weight: Long Term 170 lb (77.1 kg)    Expected Outcomes Short Term: Continue to assess and modify interventions until short term weight is achieved;Long Term: Adherence to nutrition and physical activity/exercise program aimed toward attainment of established weight goal;Weight Maintenance: Understanding of the daily nutrition guidelines, which includes 25-35% calories from fat, 7% or less cal from saturated fats, less than 200mg  cholesterol, less than 1.5gm of sodium, & 5 or more servings of fruits and vegetables daily;Understanding recommendations for meals to include 15-35% energy as protein, 25-35% energy from fat, 35-60% energy from carbohydrates, less than 200mg  of dietary cholesterol, 20-35 gm of total fiber daily;Understanding of distribution of  calorie intake throughout the day with the consumption of 4-5 meals/snacks    Diabetes Yes    Intervention Provide education about signs/symptoms and action to take for hypo/hyperglycemia.;Provide education about proper nutrition, including hydration, and aerobic/resistive exercise prescription along with prescribed medications to achieve blood glucose in normal ranges: Fasting glucose 65-99 mg/dL    Expected Outcomes Short Term: Participant verbalizes understanding of the signs/symptoms and immediate care of hyper/hypoglycemia, proper foot care and  importance of medication, aerobic/resistive exercise and nutrition plan for blood glucose control.;Long Term: Attainment of HbA1C < 7%.    Hypertension Yes    Intervention Provide education on lifestyle modifcations including regular physical activity/exercise, weight management, moderate sodium restriction and increased consumption of fresh fruit, vegetables, and low fat dairy, alcohol moderation, and smoking cessation.;Monitor prescription use compliance.    Expected Outcomes Short Term: Continued assessment and intervention until BP is < 140/17mm HG in hypertensive participants. < 130/40mm HG in hypertensive participants with diabetes, heart failure or chronic kidney disease.;Long Term: Maintenance of blood pressure at goal levels.             Education:Diabetes - Individual verbal and written instruction to review signs/symptoms of diabetes, desired ranges of glucose level fasting, after meals and with exercise. Acknowledge that pre and post exercise glucose checks will be done for 3 sessions at entry of program. [place] from 11/06/2022 in [facility] Cardiac and Pulmonary Rehab  Date 09/17/22  Educator [facility]  Instruction Review Code 1- Verbalizes Understanding       Core Components/Risk Factors/Patient Goals Review:   Goals and Risk Factor Review     Row Name 09/30/22 0821 10/21/22 0755 11/20/22 0758         Core Components/Risk Factors/Patient Goals Review   Personal Goals Review Weight Management/Obesity;Hypertension;Diabetes Diabetes;Weight Management/Obesity Diabetes;Weight Management/Obesity;Hypertension     Review Marcus Huang is doing well in rehab thus far.  He would like to lose a little more weight, but recently it has been holding steady. He is eating a keto/low carb diet with his diabetes.  His at home test has his A1c down to 5.5.  He is happy with where he is but would still like to lose more in his belly.  His pressures are doing well in class.  They are at the  lowest that he has every had.  They have ranged between 130-110s for most part on top an in the 60s on the bottom. He does note that he feels tired all the time and does not have any energy.  He was encouraged to talk to doctor. Marcus Huang wants to lose some weight and get his weight down to 160 pounds. He also wants to reduce his A1c. His A1c last time was 8.1 in the lab test. He did a home A1C test which showed 5.5. He has more labs on February 12th. Marcus Huang is doing well in rehab. His weight has been holding steady.  He would like to lose 5 lb if possible, but also good with being on plateu.  His goal is 160 lb.  He has been able to get his A1c down to 5.7 and still proud of his work. All his other labs looked good too.  His pressures have been doing well.  His numbers have been low for him but he has not felt symptomatic.  He is feeling the best he ever has over the last 20 yrs.     Expected Outcomes Short: Continue to keep close eye on sugars and  talk to doctor about energy levels Long: COnitnue to work on loweing blood pressures Short: Change diet, lose some weight and watch blood sugar. Long: Reach weight goal and reduce A1C. Short: Continue to work on weight loss Long: Conitnue to manage diabetes              Core Components/Risk Factors/Patient Goals at Discharge (Final Review):   Goals and Risk Factor Review - 11/20/22 0758       Core Components/Risk Factors/Patient Goals Review   Personal Goals Review Diabetes;Weight Management/Obesity;Hypertension    Review Marcus Huang is doing well in rehab. His weight has been holding steady.  He would like to lose 5 lb if possible, but also good with being on plateu.  His goal is 160 lb.  He has been able to get his A1c down to 5.7 and still proud of his work. All his other labs looked good too.  His pressures have been doing well.  His numbers have been low for him but he has not felt symptomatic.  He is feeling the best he ever has over the last 20 yrs.    Expected  Outcomes Short: Continue to work on weight loss Long: Conitnue to manage diabetes             ITP Comments:  ITP Comments     Row Name 09/09/22 1318 09/17/22 1244 09/18/22 0921 10/08/22 1045 11/05/22 1501   ITP Comments Initial phone call completed. Diagnosis can be found in [facility] 12/14. EP Orientation scheduled for Wednesday 1/3 at 10:30. Completed 6MWT and gym orientation. Initial ITP created and sent for review to Dr. Emily Filbert, Medical Director. First full day of exercise!  Patient was oriented to gym and equipment including functions, settings, policies, and procedures.  Patient's individual exercise prescription and treatment plan were reviewed.  All starting workloads were established based on the results of the 6 minute walk test done at initial orientation visit.  The plan for exercise progression was also introduced and progression will be customized based on patient's performance and goals. 30 Day review completed. Medical Director ITP review done, changes made as directed, and signed approval by Medical Director.    new to program 30 day review completed. ITP sent to Dr. Emily Filbert, Medical Director of Cardiac Rehab. Continue with ITP unless changes are made by physician.    [place] Name 12/03/22 0933           ITP Comments 30 Day review completed. Medical Director ITP review done, changes made as directed, and signed approval by Medical Director.                Comments:

## 2022-12-04 ENCOUNTER — Encounter: Payer: PPO | Admitting: *Deleted

## 2022-12-04 DIAGNOSIS — Z955 Presence of coronary angioplasty implant and graft: Secondary | ICD-10-CM

## 2022-12-04 NOTE — Progress Notes (Signed)
Daily Session Note  Patient Details  Name: Marcus Huang MRN: [id number] Date of Birth: 01-17-1951 Referring Provider:   Flowsheet Row Cardiac Rehab from 09/17/2022 in [facility] Cardiac and Pulmonary Rehab  Referring Provider Paraschos/Fath       Encounter Date: 12/04/2022  Check In:  Session Check In - 12/04/22 0801       Check-In   Supervising physician immediately available to respond to emergencies See telemetry face sheet for immediately available ER MD    Location [facility]-Cardiac & Pulmonary Rehab    Staff Present Darlyne Russian, RN, ADN;Krista Frederico Hamman RN, Abel Presto, MS, ACSM CEP, Exercise Physiologist;Joseph Tessie Fass, [place]    Virtual Visit No    Medication changes reported     No    Fall or balance concerns reported    No    Warm-up and Cool-down Performed on first and last piece of equipment    Resistance Training Performed Yes    VAD Patient? No    PAD/SET Patient? No      Pain Assessment   Currently in Pain? No/denies                Social History   Tobacco Use  Smoking Status Never  Smokeless Tobacco Never    Goals Met:  Independence with exercise equipment Exercise tolerated well No report of concerns or symptoms today Strength training completed today  Goals Unmet:  Not Applicable  Comments: Pt able to follow exercise prescription today without complaint.  Will continue to monitor for progression.    Dr. Emily Filbert is Medical Director for [facility].  Dr. Ottie Glazier is Medical Director for [facility] Pulmonary Rehabilitation.

## 2022-12-09 ENCOUNTER — Encounter: Payer: PPO | Admitting: *Deleted

## 2022-12-09 DIAGNOSIS — Z955 Presence of coronary angioplasty implant and graft: Secondary | ICD-10-CM | POA: Diagnosis not present

## 2022-12-09 NOTE — Progress Notes (Signed)
Daily Session Note  Patient Details  Name: Marcus Huang MRN: [id number] Date of Birth: 04/23/1951 Referring Provider:   Flowsheet Row Cardiac Rehab from 09/17/2022 in [facility] Cardiac and Pulmonary Rehab  Referring Provider Paraschos/Fath       Encounter Date: 12/09/2022  Check In:  Session Check In - 12/09/22 [id number]       Check-In   Supervising physician immediately available to respond to emergencies See telemetry face sheet for immediately available ER MD    Location [facility]-Cardiac & Pulmonary Rehab    Staff Present Heath Lark, RN, BSN, CCRP;Jessica Grovespring, MA, RCEP, CCRP, Bertram Gala, MS, ACSM CEP, Exercise Physiologist    Virtual Visit No    Medication changes reported     No    Fall or balance concerns reported    No    Warm-up and Cool-down Performed on first and last piece of equipment    Resistance Training Performed Yes    VAD Patient? No    PAD/SET Patient? No      Pain Assessment   Currently in Pain? No/denies                Social History   Tobacco Use  Smoking Status Never  Smokeless Tobacco Never    Goals Met:  Independence with exercise equipment Exercise tolerated well No report of concerns or symptoms today  Goals Unmet:  Not Applicable  Comments: Pt able to follow exercise prescription today without complaint.  Will continue to monitor for progression.    Dr. Emily Filbert is Medical Director for [facility].  Dr. Ottie Glazier is Medical Director for [facility] Pulmonary Rehabilitation.

## 2022-12-11 ENCOUNTER — Encounter: Payer: PPO | Admitting: *Deleted

## 2022-12-11 VITALS — Ht 68.0 in | Wt 172.1 lb

## 2022-12-11 DIAGNOSIS — Z955 Presence of coronary angioplasty implant and graft: Secondary | ICD-10-CM | POA: Diagnosis not present

## 2022-12-11 NOTE — Progress Notes (Signed)
Daily Session Note  Patient Details  Name: Marcus Huang MRN: [id number] Date of Birth: Nov 11, 1950 Referring Provider:   Flowsheet Row Cardiac Rehab from 09/17/2022 in [facility] Cardiac and Pulmonary Rehab  Referring Provider Paraschos/Fath       Encounter Date: 12/11/2022  Check In:  Session Check In - 12/11/22 0808       Check-In   Supervising physician immediately available to respond to emergencies See telemetry face sheet for immediately available ER MD    Location [facility]-Cardiac & Pulmonary Rehab    Staff Present Darlyne Russian, RN, Lorin Mercy, MS, ACSM CEP, Exercise Physiologist;Joseph Tessie Fass, [place]    Virtual Visit No    Medication changes reported     No    Fall or balance concerns reported    No    Warm-up and Cool-down Performed on first and last piece of equipment    Resistance Training Performed Yes    VAD Patient? No    PAD/SET Patient? No      Pain Assessment   Currently in Pain? No/denies                Social History   Tobacco Use  Smoking Status Never  Smokeless Tobacco Never    Goals Met:  Independence with exercise equipment Exercise tolerated well No report of concerns or symptoms today Strength training completed today  Goals Unmet:  Not Applicable  Comments: Pt able to follow exercise prescription today without complaint.  Will continue to monitor for progression.    Dr. Emily Filbert is Medical Director for [facility].  Dr. Ottie Glazier is Medical Director for [facility] Pulmonary Rehabilitation.

## 2022-12-11 NOTE — Patient Instructions (Addendum)
Discharge Patient Instructions  Patient Details  Name: Marcus Huang MRN: [id number] Date of Birth: 1951-04-12 Referring Provider:  Sofie Hartigan, MD   Number of Visits: 45  Reason for Discharge:  Patient reached a stable level of exercise. Patient independent in their exercise. Patient has met program and personal goals.  Smoking History:  Social History   Tobacco Use  Smoking Status Never  Smokeless Tobacco Never    Diagnosis:  Status post coronary artery stent placement  Initial Exercise Prescription:  Initial Exercise Prescription - 09/17/22 1200       Date of Initial Exercise RX and Referring Provider   Date 09/17/22    Referring Provider Paraschos/Fath      Oxygen   Maintain Oxygen Saturation 88% or higher      Treadmill   MPH 2.5    Grade 1    Minutes 15    METs 3.26      Elliptical   Level 1    Speed 3    Minutes 1    METs 3.14      Prescription Details   Frequency (times per week) 2    Duration Progress to 30 minutes of continuous aerobic without signs/symptoms of physical distress      Intensity   THRR 40-80% of Max Heartrate 97-133    Ratings of Perceived Exertion 11-13    Perceived Dyspnea 0-4      Progression   Progression Continue to progress workloads to maintain intensity without signs/symptoms of physical distress.      Resistance Training   Training Prescription Yes    Weight 5    Reps 10-15             Discharge Exercise Prescription (Final Exercise Prescription Changes):  Exercise Prescription Changes - 12/08/22 1300       Response to Exercise   Blood Pressure (Admit) 132/74    Blood Pressure (Exit) 128/60    Heart Rate (Admit) 52 bpm    Heart Rate (Exercise) 112 bpm    Heart Rate (Exit) 70 bpm    Rating of Perceived Exertion (Exercise) 14    Symptoms none    Duration Continue with 30 min of aerobic exercise without signs/symptoms of physical distress.    Intensity THRR unchanged      Progression    Progression Continue to progress workloads to maintain intensity without signs/symptoms of physical distress.    Average METs 5.85      Resistance Training   Training Prescription Yes    Weight 10 lb    Reps 10-15      Interval Training   Interval Training Yes    Equipment Treadmill    Comments up to 7% incline      Treadmill   MPH 4.6    Grade 7    Minutes 15    METs 10.8      Elliptical   Level 3    Speed 4    Minutes 15    METs 4.1      REL-XR   Level 6    Minutes 15    METs 5.5      Home Exercise Plan   Plans to continue exercise at Home (comment)   walking at home   Frequency Add 2 additional days to program exercise sessions.    Initial Home Exercises Provided 09/30/22      Oxygen   Maintain Oxygen Saturation 88% or higher  Functional Capacity:  6 Minute Walk     Row Name 09/17/22 1246 12/11/22 0809       6 Minute Walk   Phase Initial Discharge    Distance 1300 feet 1910 feet    Distance % Change -- 46.9 %    Distance Feet Change -- 610 ft    Walk Time 6 minutes 6 minutes    # of Rest Breaks 0 0    MPH 2.46 3.62    METS 3.14 4.51    RPE 11 12    Perceived Dyspnea  0 --    VO2 Peak 10.98 15.71    Symptoms No No    Resting HR 62 bpm 57 bpm    Resting BP 148/82 100/58    Resting Oxygen Saturation  99 % 98 %    Exercise Oxygen Saturation  during 6 min walk 98 % 95 %    Max Ex. HR 103 bpm 106 bpm    Max Ex. BP 138/62 174/72    2 Minute Post BP 124/72 --             Post Biometrics - 12/11/22 0810        Post  Biometrics   Height 5\' 8"  (1.727 m)    Weight 172 lb 1.6 oz (78.1 kg)    Waist Circumference 36 inches    Hip Circumference 37.5 inches    Waist to Hip Ratio 0.96 %    BMI (Calculated) 26.17           Goals reviewed with patient; copy given to patient.

## 2022-12-16 ENCOUNTER — Encounter: Payer: PPO | Attending: Cardiology | Admitting: *Deleted

## 2022-12-16 DIAGNOSIS — Z955 Presence of coronary angioplasty implant and graft: Secondary | ICD-10-CM | POA: Diagnosis not present

## 2022-12-16 NOTE — Progress Notes (Signed)
Daily Session Note  Patient Details  Name: Marcus Huang MRN: [id number] Date of Birth: 1950-12-05 Referring Provider:   Flowsheet Row Cardiac Rehab from 09/17/2022 in [facility] Cardiac and Pulmonary Rehab  Referring Provider Paraschos/Fath       Encounter Date: 12/16/2022  Check In:  Session Check In - 12/16/22 0845       Check-In   Supervising physician immediately available to respond to emergencies See telemetry face sheet for immediately available ER MD    Location [facility]-Cardiac & Pulmonary Rehab    Staff Present Heath Lark, RN, BSN, Kathaleen Maser, MS, ACSM CEP, Exercise Physiologist;Other   Gretchen Short BS Exercise Science   Virtual Visit No    Medication changes reported     No    Fall or balance concerns reported    No    Warm-up and Cool-down Performed on first and last piece of equipment    Resistance Training Performed Yes    VAD Patient? No    PAD/SET Patient? No      Pain Assessment   Currently in Pain? No/denies                Social History   Tobacco Use  Smoking Status Never  Smokeless Tobacco Never    Goals Met:  Independence with exercise equipment Exercise tolerated well No report of concerns or symptoms today  Goals Unmet:  Not Applicable  Comments: Pt able to follow exercise prescription today without complaint.  Will continue to monitor for progression.    Dr. Emily Filbert is Medical Director for [facility].  Dr. Ottie Glazier is Medical Director for [facility] Pulmonary Rehabilitation.

## 2022-12-18 ENCOUNTER — Encounter: Payer: PPO | Admitting: *Deleted

## 2022-12-23 ENCOUNTER — Encounter: Payer: PPO | Admitting: *Deleted

## 2022-12-23 DIAGNOSIS — Z955 Presence of coronary angioplasty implant and graft: Secondary | ICD-10-CM | POA: Diagnosis not present

## 2022-12-23 NOTE — Progress Notes (Signed)
Daily Session Note  Patient Details  Name: Marcus Huang MRN: [id number] Date of Birth: [birth year] Referring Provider:   Flowsheet Row Cardiac Rehab from 09/17/2022 in [facility] Cardiac and Pulmonary Rehab  Referring Provider Paraschos/Fath       Encounter Date: 12/23/2022  Check In:  Session Check In - 12/23/22 0759       Check-In   Supervising physician immediately available to respond to emergencies See telemetry face sheet for immediately available ER MD    Location [facility]-Cardiac & Pulmonary Rehab    Staff Present Cyndia Diver, RN, BSN, Damita Dunnings, MA, RCEP, CCRP, Zackery Barefoot, MS, ACSM CEP, Exercise Physiologist    Virtual Visit No    Medication changes reported     No    Fall or balance concerns reported    No    Tobacco Cessation No Change    Warm-up and Cool-down Performed on first and last piece of equipment    Resistance Training Performed No    VAD Patient? No    PAD/SET Patient? No      Pain Assessment   Currently in Pain? No/denies    Multiple Pain Sites No                Social History   Tobacco Use  Smoking Status Never  Smokeless Tobacco Never    Goals Met:  Independence with exercise equipment Exercise tolerated well No report of concerns or symptoms today  Goals Unmet:  Not Applicable  Comments: Pt able to follow exercise prescription today without complaint.  Will continue to monitor for progression.    Dr. Bethann Punches is Medical Director for [facility] Cardiac Rehabilitation.  Dr. Vida Rigger is Medical Director for [facility] Pulmonary Rehabilitation.

## 2022-12-25 ENCOUNTER — Encounter: Payer: PPO | Admitting: *Deleted

## 2022-12-25 DIAGNOSIS — Z955 Presence of coronary angioplasty implant and graft: Secondary | ICD-10-CM | POA: Diagnosis not present

## 2022-12-25 NOTE — Progress Notes (Signed)
Daily Session Note  Patient Details  Name: Marcus Huang MRN: [id number] Date of Birth: 07/15/1951 Referring Provider:   Flowsheet Row Cardiac Rehab from 09/17/2022 in [facility] Cardiac and Pulmonary Rehab  Referring Provider Paraschos/Fath       Encounter Date: 12/25/2022  Check In:  Session Check In - 12/25/22 0755       Check-In   Supervising physician immediately available to respond to emergencies See telemetry face sheet for immediately available ER MD    Location [facility]-Cardiac & Pulmonary Rehab    Staff Present Lanny Hurst, RN, ADN;Jessica Juanetta Gosling, MA, RCEP, CCRP, CCET;Joseph Elbe, [place]    Virtual Visit No    Medication changes reported     No    Fall or balance concerns reported    No    Warm-up and Cool-down Performed on first and last piece of equipment    Resistance Training Performed Yes    VAD Patient? No    PAD/SET Patient? No      Pain Assessment   Currently in Pain? No/denies                Social History   Tobacco Use  Smoking Status Never  Smokeless Tobacco Never    Goals Met:  Independence with exercise equipment Exercise tolerated well No report of concerns or symptoms today Strength training completed today  Goals Unmet:  Not Applicable  Comments: Pt able to follow exercise prescription today without complaint.  Will continue to monitor for progression.    Dr. Bethann Punches is Medical Director for [facility] Cardiac Rehabilitation.  Dr. Vida Rigger is Medical Director for [facility] Pulmonary Rehabilitation.

## 2022-12-30 ENCOUNTER — Encounter: Payer: PPO | Admitting: *Deleted

## 2022-12-30 DIAGNOSIS — Z955 Presence of coronary angioplasty implant and graft: Secondary | ICD-10-CM

## 2022-12-30 NOTE — Progress Notes (Signed)
Daily Session Note  Patient Details  Name: AITAN ROSSBACH MRN: [id number] Date of Birth: 16-Apr-1951 Referring Provider:   Flowsheet Row Cardiac Rehab from 09/17/2022 in [facility] Cardiac and Pulmonary Rehab  Referring Provider Paraschos/Fath       Encounter Date: 12/30/2022  Check In:  Session Check In - 12/30/22 0823       Check-In   Supervising physician immediately available to respond to emergencies See telemetry face sheet for immediately available ER MD    Location [facility]-Cardiac & Pulmonary Rehab    Staff Present Cyndia Diver, RN, BSN, Damita Dunnings, MA, RCEP, CCRP, Zackery Barefoot, MS, ACSM CEP, Exercise Physiologist    Virtual Visit No    Medication changes reported     No    Fall or balance concerns reported    No    Tobacco Cessation No Change    Warm-up and Cool-down Performed on first and last piece of equipment    Resistance Training Performed Yes    VAD Patient? No    PAD/SET Patient? No      Pain Assessment   Currently in Pain? No/denies                Social History   Tobacco Use  Smoking Status Never  Smokeless Tobacco Never    Goals Met:  Independence with exercise equipment Exercise tolerated well No report of concerns or symptoms today  Goals Unmet:  Not Applicable  Comments: Pt able to follow exercise prescription today without complaint.  Will continue to monitor for progression.    Dr. Bethann Punches is Medical Director for [facility] Cardiac Rehabilitation.  Dr. Vida Rigger is Medical Director for [facility] Pulmonary Rehabilitation.

## 2022-12-31 ENCOUNTER — Encounter: Payer: Self-pay | Admitting: *Deleted

## 2022-12-31 DIAGNOSIS — Z955 Presence of coronary angioplasty implant and graft: Secondary | ICD-10-CM

## 2022-12-31 NOTE — Progress Notes (Signed)
Cardiac Individual Treatment Plan  Patient Details  Name: Marcus Huang MRN: [id number] Date of Birth: Jan 04, 1951 Referring Provider:   Flowsheet Row Cardiac Rehab from 09/17/2022 in [facility] Cardiac and Pulmonary Rehab  Referring Provider Paraschos/Fath       Initial Encounter Date:  Flowsheet Row Cardiac Rehab from 09/17/2022 in [facility] Cardiac and Pulmonary Rehab  Date 09/17/22       Visit Diagnosis: Status post coronary artery stent placement  Patient's Home Medications on Admission:  Current Outpatient Medications:    amLODipine (NORVASC) 2.5 MG tablet, Take 1 tablet (2.5 mg total) by mouth in the morning and at bedtime., Disp: 60 tablet, Rfl: 11   aspirin EC 81 MG tablet, Take 1 tablet (81 mg total) by mouth daily at 12 noon., Disp: 30 tablet, Rfl: 2   atorvastatin (LIPITOR) 80 MG tablet, Take 1 tablet (80 mg total) by mouth at bedtime., Disp: 30 tablet, Rfl: 11   azelastine (ASTELIN) 0.1 % nasal spray, Place into the nose. PRN, Disp: , Rfl:    clopidogrel (PLAVIX) 75 MG tablet, Take 1 tablet (75 mg total) by mouth daily., Disp: 30 tablet, Rfl: 11   hydrochlorothiazide (HYDRODIURIL) 25 MG tablet, Take 1 tablet (25 mg total) by mouth daily., Disp: 30 tablet, Rfl: 11   metoprolol tartrate (LOPRESSOR) 25 MG tablet, Take 0.5 tablets (12.5 mg total) by mouth 2 (two) times daily., Disp: 30 tablet, Rfl: 11   Multiple Vitamin (MULTIVITAMIN) tablet, Take 1 tablet by mouth daily., Disp: , Rfl:    pioglitazone (ACTOS) 30 MG tablet, Take 30 mg by mouth daily., Disp: , Rfl:    PREVIDENT 5000 SENSITIVE 1.1-5 % GEL, Place onto teeth as directed., Disp: , Rfl:    traMADol (ULTRAM) 50 MG tablet, TAKE 1 TABLET(50 MG) BY MOUTH EVERY 6 HOURS AS NEEDED FOR PAIN, Disp: , Rfl:    VITAMIN D PO, Take 5,000 Units by mouth daily., Disp: , Rfl:   Past Medical History: Past Medical History:  Diagnosis Date   Carotid artery thrombosis    Left Carotid Artery   Coronary artery disease    Diabetes (HCC)     Diverticulosis    GERD (gastroesophageal reflux disease)    History of kidney stones    Hyperlipidemia    Hypertension    Renal mass    Sleep apnea    C-PAP    Tobacco Use: Social History   Tobacco Use  Smoking Status Never  Smokeless Tobacco Never    Labs: Review Flowsheet        No data to display           Exercise Target Goals: Exercise Program Goal: Individual exercise prescription set using results from initial 6 min walk test and THRR while considering  patient's activity barriers and safety.   Exercise Prescription Goal: Initial exercise prescription builds to 30-45 minutes a day of aerobic activity, 2-3 days per week.  Home exercise guidelines will be given to patient during program as part of exercise prescription that the participant will acknowledge.   Education: Aerobic Exercise: - Group verbal and visual presentation on the components of exercise prescription. Introduces F.I.T.T principle from ACSM for exercise prescriptions.  Reviews F.I.T.T. principles of aerobic exercise including progression. Written material given at graduation.   Education: Resistance Exercise: - Group verbal and visual presentation on the components of exercise prescription. Introduces F.I.T.T principle from ACSM for exercise prescriptions  Reviews F.I.T.T. principles of resistance exercise including progression. Written material given at graduation. Flowsheet  Row Cardiac Rehab from 11/06/2022 in [facility] Cardiac and Pulmonary Rehab  Date 10/09/22  Educator [facility]  Instruction Review Code 1- Verbalizes Understanding        Education: Exercise & Equipment Safety: - Individual verbal instruction and demonstration of equipment use and safety with use of the equipment. Flowsheet Row Cardiac Rehab from 11/06/2022 in [facility] Cardiac and Pulmonary Rehab  Date 09/17/22  Educator [facility]  Instruction Review Code 1- Verbalizes Understanding       Education: Exercise Physiology & General Exercise  Guidelines: - Group verbal and written instruction with models to review the exercise physiology of the cardiovascular system and associated critical values. Provides general exercise guidelines with specific guidelines to those with heart or lung disease.    Education: Flexibility, Balance, Mind/Body Relaxation: - Group verbal and visual presentation with interactive activity on the components of exercise prescription. Introduces F.I.T.T principle from ACSM for exercise prescriptions. Reviews F.I.T.T. principles of flexibility and balance exercise training including progression. Also discusses the mind body connection.  Reviews various relaxation techniques to help reduce and manage stress (i.e. Deep breathing, progressive muscle relaxation, and visualization). Balance handout provided to take home. Written material given at graduation. Flowsheet Row Cardiac Rehab from 11/06/2022 in [facility] Cardiac and Pulmonary Rehab  Date 10/16/22  Educator [facility]  Instruction Review Code 1- Verbalizes Understanding       Activity Barriers & Risk Stratification:  Activity Barriers & Cardiac Risk Stratification - 09/17/22 1248       Activity Barriers & Cardiac Risk Stratification   Activity Barriers None    Cardiac Risk Stratification Moderate             6 Minute Walk:  6 Minute Walk     Row Name 09/17/22 1246 12/11/22 0809       6 Minute Walk   Phase Initial Discharge    Distance 1300 feet 1910 feet    Distance % Change -- 46.9 %    Distance Feet Change -- 610 ft    Walk Time 6 minutes 6 minutes    # of Rest Breaks 0 0    MPH 2.46 3.62    METS 3.14 4.51    RPE 11 12    Perceived Dyspnea  0 --    VO2 Peak 10.98 15.71    Symptoms No No    Resting HR 62 bpm 57 bpm    Resting BP 148/82 100/58    Resting Oxygen Saturation  99 % 98 %    Exercise Oxygen Saturation  during 6 min walk 98 % 95 %    Max Ex. HR 103 bpm 106 bpm    Max Ex. BP 138/62 174/72    2 Minute Post BP 124/72 --              Oxygen Initial Assessment:   Oxygen Re-Evaluation:   Oxygen Discharge (Final Oxygen Re-Evaluation):   Initial Exercise Prescription:  Initial Exercise Prescription - 09/17/22 1200       Date of Initial Exercise RX and Referring Provider   Date 09/17/22    Referring Provider Paraschos/Fath      Oxygen   Maintain Oxygen Saturation 88% or higher      Treadmill   MPH 2.5    Grade 1    Minutes 15    METs 3.26      Elliptical   Level 1    Speed 3    Minutes 1    METs 3.14  Prescription Details   Frequency (times per week) 2    Duration Progress to 30 minutes of continuous aerobic without signs/symptoms of physical distress      Intensity   THRR 40-80% of Max Heartrate 97-133    Ratings of Perceived Exertion 11-13    Perceived Dyspnea 0-4      Progression   Progression Continue to progress workloads to maintain intensity without signs/symptoms of physical distress.      Resistance Training   Training Prescription Yes    Weight 5    Reps 10-15             Perform Capillary Blood Glucose checks as needed.  Exercise Prescription Changes:   Exercise Prescription Changes     Row Name 09/17/22 1200 09/29/22 0900 09/30/22 0800 10/13/22 1300 10/27/22 0900     Response to Exercise   Blood Pressure (Admit) 148/82 130/64 -- 144/72 102/60   Blood Pressure (Exercise) 138/62 154/64 -- 148/74 134/66   Blood Pressure (Exit) 124/72 124/64 -- 138/70 100/60   Heart Rate (Admit) 62 bpm 70 bpm -- 74 bpm 61 bpm   Heart Rate (Exercise) 103 bpm 103 bpm -- 123 bpm 116 bpm   Heart Rate (Exit) 64 bpm 66 bpm -- 82 bpm 60 bpm   Oxygen Saturation (Admit) 99 % -- -- -- --   Oxygen Saturation (Exercise) 98 % -- -- -- --   Oxygen Saturation (Exit) 98 % -- -- -- --   Rating of Perceived Exertion (Exercise) 11 13 -- 12 14   Perceived Dyspnea (Exercise) 0 -- -- -- --   Symptoms none none -- Stated that "legs feel like jello" none   Comments 6 MWT results 2nd full day  of exercise -- -- --   Duration -- Continue with 30 min of aerobic exercise without signs/symptoms of physical distress. -- Continue with 30 min of aerobic exercise without signs/symptoms of physical distress. Continue with 30 min of aerobic exercise without signs/symptoms of physical distress.   Intensity -- THRR unchanged -- THRR unchanged THRR unchanged     Progression   Progression -- Continue to progress workloads to maintain intensity without signs/symptoms of physical distress. -- Continue to progress workloads to maintain intensity without signs/symptoms of physical distress. Continue to progress workloads to maintain intensity without signs/symptoms of physical distress.   Average METs -- 3.21 -- 4.86 4.97     Resistance Training   Training Prescription -- Yes -- Yes Yes   Weight -- 5 lb -- 5 lb 6 lb   Reps -- 10-15 -- 10-15 10-15     Interval Training   Interval Training -- No -- No Yes   Equipment -- -- -- -- Treadmill;Recumbant Elliptical   Comments -- -- -- -- incline vary 4-8%; L 2.5-5     Treadmill   MPH -- 3.1 -- 3.2 3.4  intervals   Grade -- 3 -- 6 8   Minutes -- 15 -- 15 15   METs -- 4.66 -- 6.1 6.8     Recumbant Elliptical   Level -- 2.3 -- 3 5  intervals   Minutes -- 15 -- 15 15   METs -- 1.9 -- -- 3.7     Elliptical   Level -- 1 -- 1 --   Speed -- 2.9 -- 4 --   Minutes -- 15 -- 15 --   METs -- 2.7 -- 3.2 --     REL-XR   Level -- -- -- 8 8  Minutes -- -- -- 15 15   METs -- -- -- 5 4.4     Home Exercise Plan   Plans to continue exercise at -- -- Home (comment)  walking at home Home (comment)  walking at home Home (comment)  walking at home   Frequency -- -- Add 2 additional days to program exercise sessions. Add 2 additional days to program exercise sessions. Add 2 additional days to program exercise sessions.   Initial Home Exercises Provided -- -- 09/30/22 09/30/22 09/30/22     Oxygen   Maintain Oxygen Saturation -- 88% or higher -- 88% or higher  88% or higher    Row Name 11/10/22 1300 11/24/22 1100 12/08/22 1300 12/22/22 1200       Response to Exercise   Blood Pressure (Admit) 120/68 132/68 132/74 130/64    Blood Pressure (Exercise) 136/72 -- -- --    Blood Pressure (Exit) 138/78 112/66 128/60 126/70    Heart Rate (Admit) 62 bpm 52 bpm 52 bpm 52 bpm    Heart Rate (Exercise) 108 bpm 97 bpm 112 bpm 119 bpm    Heart Rate (Exit) 72 bpm 58 bpm 70 bpm 62 bpm    Rating of Perceived Exertion (Exercise) Symptoms none none none none    Duration Continue with 30 min of aerobic exercise without signs/symptoms of physical distress. Continue with 30 min of aerobic exercise without signs/symptoms of physical distress. Continue with 30 min of aerobic exercise without signs/symptoms of physical distress. --    Intensity THRR unchanged THRR unchanged THRR unchanged --      Progression   Progression Continue to progress workloads to maintain intensity without signs/symptoms of physical distress. Continue to progress workloads to maintain intensity without signs/symptoms of physical distress. Continue to progress workloads to maintain intensity without signs/symptoms of physical distress. Continue to progress workloads to maintain intensity without signs/symptoms of physical distress.    Average METs 4.74 4.34 5.85 5.23      Resistance Training   Training Prescription Yes Yes Yes Yes    Weight 8 lb 10 lb 10 lb 12 lb    Reps 10-15 10-15 10-15 10-15      Interval Training   Interval Training Yes Yes Yes Yes    Equipment Treadmill Treadmill Treadmill Treadmill    Comments speed 3.2-4.5 mph up to 6% incline\ up to 7% incline 3.2 speed, up to 7% incline      Treadmill   MPH 4.5 3.2 4.6 3.2    Grade 5.5 6.5 7 7   intervals    Minutes METs 7.86 6.3 10.8 6.5      Recumbant Elliptical   Level 3 3.5 -- --    Minutes 15 15 -- --    METs 2.2 2.4 -- --      Elliptical   Level Speed 4.8    Minutes METs -- -- 4.1 4.6      REL-XR   Level --    Minutes --    METs 5.6 -- 5.5 --      Home Exercise Plan   Plans to continue exercise at Home (comment)  walking at home Home (comment)  walking at home Home (comment)  walking at home Home (comment)  walking at home    Frequency Add  2 additional days to program exercise sessions. Add 2 additional days to program exercise sessions. Add 2 additional days to program exercise sessions. Add 2 additional days to program exercise sessions.    Initial Home Exercises Provided 09/30/22 09/30/22 09/30/22 09/30/22      Oxygen   Maintain Oxygen Saturation 88% or higher 88% or higher 88% or higher 88% or higher             Exercise Comments:   Exercise Comments     Row Name 09/18/22 1610           Exercise Comments First full day of exercise!  Patient was oriented to gym and equipment including functions, settings, policies, and procedures.  Patient's individual exercise prescription and treatment plan were reviewed.  All starting workloads were established based on the results of the 6 minute walk test done at initial orientation visit.  The plan for exercise progression was also introduced and progression will be customized based on patient's performance and goals.                Exercise Goals and Review:   Exercise Goals     Row Name 09/17/22 1252             Exercise Goals   Increase Physical Activity Yes       Intervention Provide advice, education, support and counseling about physical activity/exercise needs.;Develop an individualized exercise prescription for aerobic and resistive training based on initial evaluation findings, risk stratification, comorbidities and participant's personal goals.       Expected Outcomes Short Term: Attend rehab on a regular basis to increase amount of physical activity.;Long Term: Add in home exercise to make exercise part of routine and to increase amount of  physical activity.;Long Term: Exercising regularly at least 3-5 days a week.       Increase Strength and Stamina Yes       Intervention Provide advice, education, support and counseling about physical activity/exercise needs.;Develop an individualized exercise prescription for aerobic and resistive training based on initial evaluation findings, risk stratification, comorbidities and participant's personal goals.       Expected Outcomes Short Term: Increase workloads from initial exercise prescription for resistance, speed, and METs.;Short Term: Perform resistance training exercises routinely during rehab and add in resistance training at home;Long Term: Improve cardiorespiratory fitness, muscular endurance and strength as measured by increased METs and functional capacity ( )       Able to understand and use rate of perceived exertion (RPE) scale Yes       Intervention Provide education and explanation on how to use RPE scale       Expected Outcomes Short Term: Able to use RPE daily in rehab to express subjective intensity level;Long Term:  Able to use RPE to guide intensity level when exercising independently       Able to understand and use Dyspnea scale Yes       Intervention Provide education and explanation on how to use Dyspnea scale       Expected Outcomes Short Term: Able to use Dyspnea scale daily in rehab to express subjective sense of shortness of breath during exertion;Long Term: Able to use Dyspnea scale to guide intensity level when exercising independently       Knowledge and understanding of Target Heart Rate Range (THRR) Yes       Intervention Provide education and explanation of THRR including how the numbers were predicted and where they are located for reference  Expected Outcomes Short Term: Able to state/look up THRR;Long Term: Able to use THRR to govern intensity when exercising independently;Short Term: Able to use daily as guideline for intensity in rehab       Able to  check pulse independently Yes       Intervention Provide education and demonstration on how to check pulse in carotid and radial arteries.;Review the importance of being able to check your own pulse for safety during independent exercise       Expected Outcomes Short Term: Able to explain why pulse checking is important during independent exercise       Understanding of Exercise Prescription Yes       Intervention Provide education, explanation, and written materials on patient's individual exercise prescription       Expected Outcomes Short Term: Able to explain program exercise prescription;Long Term: Able to explain home exercise prescription to exercise independently                Exercise Goals Re-Evaluation :  Exercise Goals Re-Evaluation     Row Name 09/18/22 1610 09/29/22 0934 09/30/22 0813 10/13/22 1344 10/27/22 0922     Exercise Goal Re-Evaluation   Exercise Goals Review Able to understand and use rate of perceived exertion (RPE) scale;Able to understand and use Dyspnea scale;Knowledge and understanding of Target Heart Rate Range (THRR);Understanding of Exercise Prescription Increase Physical Activity;Increase Strength and Stamina;Understanding of Exercise Prescription Increase Physical Activity;Increase Strength and Stamina;Understanding of Exercise Prescription;Able to understand and use Dyspnea scale;Able to check pulse independently;Knowledge and understanding of Target Heart Rate Range (THRR);Able to understand and use rate of perceived exertion (RPE) scale Increase Physical Activity;Increase Strength and Stamina;Understanding of Exercise Prescription Increase Physical Activity;Increase Strength and Stamina;Understanding of Exercise Prescription   Comments Reviewed RPE scale, THR and program prescription with pt today.  Pt voiced understanding and was given a copy of goals to take home. Nazim is off to a good start with rehab for the first couple of sessions he has been here . He  was able to exercise on the elliptical well at about a 3 mph speed. He also increased his workload on the treadmill to a 3.1 mph/ 3% already. RPEs are at an appropriate level. Will continue to monitor as he progresses in the program. Dhyan is on day three thus far for rehab. He is doing well with his workloads and starting to bring them up slowly.  He tried out the elliptical for the first time today.  Reviewed home exercise with pt today.  Pt plans to walk at home for exercise.  Reviewed THR, pulse, RPE, sign and symptoms, pulse oximetery and when to call 911 or MD.  Also discussed weather considerations and indoor options.  Pt voiced understanding. Garv continues to do well in rehab. He recently increased his overall average MET level to 4.86 METs. He also increased his treadmill workload to a speed of 3.2 mph and an incline of 6%. He improved to level 3 on the REL and level 8 on the XR as well. We will continue to monitor his progress. Little continues to do well in rehab. He started doing intervals on the treadmill, working from 4-8% incline, working almost up to 7 METS! He also started intervals on the REL varying his workload from level 2.5-5. He is now using 6 lbs for handweights. We will continue to monitor.   Expected Outcomes Short: Use RPE daily to regulate intensity. Long: Follow program prescription in THR. Short: Continue to work  up endurance on elliptical Long: Continue to increase overall MET level and stamina Short: Start to add in more walking at home to build back up.   long: Continue to attend rehab regularly Short: Continue to increae treadmill workload. Long: Continue to improve strength and stamina. Short: Continue intervals on both treadmill and REL Long: Continue to increase overall MET level and stamina    Row Name 11/10/22 1322 11/20/22 0754 11/24/22 1135 12/08/22 1354 12/09/22 0754     Exercise Goal Re-Evaluation   Exercise Goals Review Increase Physical Activity;Increase Strength and  Stamina;Understanding of Exercise Prescription Increase Physical Activity;Increase Strength and Stamina;Understanding of Exercise Prescription Increase Physical Activity;Increase Strength and Stamina;Understanding of Exercise Prescription Increase Physical Activity;Increase Strength and Stamina;Understanding of Exercise Prescription Increase Physical Activity;Increase Strength and Stamina;Understanding of Exercise Prescription   Comments Giovanny is doing well in rehab. He has increased his intervals on the treadmill working a t a speed ranging from 3.2-4.5 mph with a 5.5% incline. He also improved to level 4 on the elliptical at a speed of 4 mph. He increased from 6 lb to 8 b hand weights for resistance training as well. We will continue to monitor his progress in the program. Wang is doing well in rehab.  He is already going back to [employer] to do some lifting.  He is also using the treadmill at home and will usually use elliptical at gym. He is really wanting to build up his leg strength.  We talked about different exercises that he could do to help it out.  He is going to try reverse lunge  and stair mill to help.  He feels that his upper body and cardio are doing much better. Nikko continues to do well in rehab. He increased to using 10 lb for his handweights. He continues to do intervals on the treadmill and increased his max incline to to 6.5%. He is enjoying the ellipitcal as he feels it is helping increase his lower leg muscles. He also increased to 3.5 level on the REL. We will continue to monitor. Oseas is doing well in rehab. He recently increased his overall average MET level to 5.85 METs. He also increased his treadmill workload to a speed of 4.6 mph and an incline of 7%. He is due for his post and will look to improve on it. We will continue to monitor his progress. Jasai is doing well in rehab.  He is back at [employer] on [place], [place], and Friday.  He is back to lifting weights again and  building back up on his weight.  He does feel like his strength is building back up. His stamina is still slow to recover.  He is satisfied with where he is now.  He feels like he has made a lot of progress thus far.   Expected Outcomes Short: Continue to do intervals on the treadmill. Long: Continue to improve strength and stamina. Short: Try reverse lunges Long: Conitnue to improve stamina Short: Continue to increase intervals on treadmill Long: Continue to increase overall MET level and stamina Short: Improve on post . Long: Continue to improve strength and stamina. Short: Continue to go to gym on off days Long: conitnue to improve strength    Row Name 12/22/22 1228             Exercise Goal Re-Evaluation   Exercise Goals Review Increase Physical Activity;Increase Strength and Stamina;Understanding of Exercise Prescription       Comments Susana continues to  do well in rehab. He completed his psot and improved by 46%! He also started using 12 lb for handweights. He continues to do intervals with the treadmill incline. He pushed his speed up to 4.8 on the elliptical. He will be graduating soon and will continue to monitor until then.       Expected Outcomes Short: Graduate Long: Continue to exercise independently at home and increase overall stamina                Discharge Exercise Prescription (Final Exercise Prescription Changes):  Exercise Prescription Changes - 12/22/22 1200       Response to Exercise   Blood Pressure (Admit) 130/64    Blood Pressure (Exit) 126/70    Heart Rate (Admit) 52 bpm    Heart Rate (Exercise) 119 bpm    Heart Rate (Exit) 62 bpm    Rating of Perceived Exertion (Exercise) 12    Symptoms none      Progression   Progression Continue to progress workloads to maintain intensity without signs/symptoms of physical distress.    Average METs 5.23      Resistance Training   Training Prescription Yes    Weight 12 lb    Reps 10-15      Interval  Training   Interval Training Yes    Equipment Treadmill    Comments 3.2 speed, up to 7% incline      Treadmill   MPH 3.2    Grade 7   intervals   Minutes 15    METs 6.5      Elliptical   Level 3    Speed 4.8    Minutes 15    METs 4.6      Home Exercise Plan   Plans to continue exercise at Home (comment)   walking at home   Frequency Add 2 additional days to program exercise sessions.    Initial Home Exercises Provided 09/30/22      Oxygen   Maintain Oxygen Saturation 88% or higher             Nutrition:  Target Goals: Understanding of nutrition guidelines, daily intake of sodium 1500mg , cholesterol 200mg , calories 30% from fat and 7% or less from saturated fats, daily to have 5 or more servings of fruits and vegetables.  Education: All About Nutrition: -Group instruction provided by verbal, written material, interactive activities, discussions, models, and posters to present general guidelines for heart healthy nutrition including fat, fiber, MyPlate, the role of sodium in heart healthy nutrition, utilization of the nutrition label, and utilization of this knowledge for meal planning. Follow up email sent as well. Written material given at graduation. Flowsheet Row Cardiac Rehab from 11/06/2022 in [facility] Cardiac and Pulmonary Rehab  Education need identified 09/17/22       Biometrics:  Pre Biometrics - 09/18/22 0804       Pre Biometrics   Single Leg Stand 23.06 seconds             Post Biometrics - 12/11/22 0810        Post  Biometrics   Height 5\' 8"  (1.727 m)    Weight 172 lb 1.6 oz (78.1 kg)    Waist Circumference 36 inches    Hip Circumference 37.5 inches    Waist to Hip Ratio 0.96 %    BMI (Calculated) 26.17             Nutrition Therapy Plan and Nutrition Goals:  Nutrition Therapy & Goals -  11/13/22 1610       Nutrition Therapy   RD appointment deferred Yes   Krayton would not like to meet with RD at this time as he reports he is doing well  with his diet and has made many changes so far.     Personal Nutrition Goals   Nutrition Goal Colburn would not like to meet with RD at this time as he reports he is doing well with his diet and has made many changes so far.             Nutrition Assessments:  MEDIFICTS Score Key: ?70 Need to make dietary changes  40-70 Heart Healthy Diet ? 40 Therapeutic Level Cholesterol Diet  Flowsheet Row Cardiac Rehab from 09/17/2022 in [facility] Cardiac and Pulmonary Rehab  Picture Your Plate Total Score on Admission 65      Picture Your Plate Scores: <96 Unhealthy dietary pattern with much room for improvement. 41-50 Dietary pattern unlikely to meet recommendations for good health and room for improvement. 51-60 More healthful dietary pattern, with some room for improvement.  >60 Healthy dietary pattern, although there may be some specific behaviors that could be improved.    Nutrition Goals Re-Evaluation:  Nutrition Goals Re-Evaluation     Row Name 09/30/22 0819 10/21/22 0753 11/20/22 0758         Goals   Current Weight -- 172 lb (78 kg) --     Nutrition Goal Meet with dietitian.  Make heart healthy changes Work on lowering sugar. Bobbye would not like to meet with RD at this time as he reports he is doing well with his diet and has made many changes so far.     Comment Nicolis has already started to implement some changes with his diet.  He has been working on getting his A1c down and his at home test has it at 5.5 now versus 8 4 months ago.  He is aiming for low carbs/keto diet.  He keeps a close eye on his sugars.  He is eating a lot of vegetables and greens as well.  He does not eat as much fruit with his diabetes. Dearies wants to reduce his A1C. He wants to meet with the dietitian for a lower carb diet. Tuck feels that his diet is doing well.  He feels good grasp on what to do and what to eat.  He continues to get in a lot of variety.     Expected Outcome Short; Continue to aim for balance in  diet Long: Continue to add variety Short: meet with the dietitian. Long: Reduce A1C. Short: Continued variety Long: Continue to focus on heart healthy eating              Nutrition Goals Discharge (Final Nutrition Goals Re-Evaluation):  Nutrition Goals Re-Evaluation - 11/20/22 0758       Goals   Nutrition Goal Jaymeson would not like to meet with RD at this time as he reports he is doing well with his diet and has made many changes so far.    Comment Ranon feels that his diet is doing well.  He feels good grasp on what to do and what to eat.  He continues to get in a lot of variety.    Expected Outcome Short: Continued variety Long: Continue to focus on heart healthy eating             Psychosocial: Target Goals: Acknowledge presence or absence of significant depression and/or stress, maximize coping  skills, provide positive support system. Participant is able to verbalize types and ability to use techniques and skills needed for reducing stress and depression.   Education: Stress, Anxiety, and Depression - Group verbal and visual presentation to define topics covered.  Reviews how body is impacted by stress, anxiety, and depression.  Also discusses healthy ways to reduce stress and to treat/manage anxiety and depression.  Written material given at graduation.   Education: Sleep Hygiene -Provides group verbal and written instruction about how sleep can affect your health.  Define sleep hygiene, discuss sleep cycles and impact of sleep habits. Review good sleep hygiene tips.    Initial Review & Psychosocial Screening:  Initial Psych Review & Screening - 09/09/22 1307       Initial Review   Current issues with None Identified      Family Dynamics   Good Support System? Yes   wife, daughter, friends     Barriers   Psychosocial barriers to participate in program There are no identifiable barriers or psychosocial needs.;The patient should benefit from training in stress management  and relaxation.      Screening Interventions   Interventions Encouraged to exercise;Provide feedback about the scores to participant;To provide support and resources with identified psychosocial needs    Expected Outcomes Short Term goal: Utilizing psychosocial counselor, staff and physician to assist with identification of specific Stressors or current issues interfering with healing process. Setting desired goal for each stressor or current issue identified.;Long Term Goal: Stressors or current issues are controlled or eliminated.;Long Term goal: The participant improves quality of Life and PHQ9 Scores as seen by post scores and/or verbalization of changes;Short Term goal: Identification and review with participant of any Quality of Life or Depression concerns found by scoring the questionnaire.             Quality of Life Scores:   Quality of Life - 12/16/22 0812       Quality of Life Scores   Health/Function Pre 26.1 %    Health/Function Post 25.14 %    Health/Function % Change -3.68 %    Socioeconomic Pre 24.38 %    Socioeconomic Post 25 %    Socioeconomic % Change  2.54 %    Psych/Spiritual Pre 23.93 %    Psych/Spiritual Post 24.57 %    Psych/Spiritual % Change 2.67 %    Family Pre 27.3 %    Family Post 24.9 %    Family % Change -8.79 %    GLOBAL Pre 25.44 %    GLOBAL Post 24.95 %    GLOBAL % Change -1.93 %            Scores of 19 and below usually indicate a poorer quality of life in these areas.  A difference of  2-3 points is a clinically meaningful difference.  A difference of 2-3 points in the total score of the Quality of Life Index has been associated with significant improvement in overall quality of life, self-image, physical symptoms, and general health in studies assessing change in quality of life.  PHQ-9: Review Flowsheet       11/06/2022 10/09/2022 09/17/2022  Depression screen PHQ 2/9  Decreased Interest 0 0 0  Down, Depressed, Hopeless 0 1 1  PHQ - 2  Score 0 1 1  Altered sleeping 0 1 3  Tired, decreased energy 1 1 1   Change in appetite 0 0 0  Feeling bad or failure about yourself  0 1 1  Trouble  concentrating 1 1 0  Moving slowly or fidgety/restless 1 0 1  Suicidal thoughts 0 0 0  PHQ-9 Score 3 5 7   Difficult doing work/chores Not difficult at all Somewhat difficult Not difficult at all   Interpretation of Total Score  Total Score Depression Severity:  1-4 = Minimal depression, 5-9 = Mild depression, 10-14 = Moderate depression, 15-19 = Moderately severe depression, 20-27 = Severe depression   Psychosocial Evaluation and Intervention:  Psychosocial Evaluation - 09/09/22 1319       Psychosocial Evaluation & Interventions   Interventions Encouraged to exercise with the program and follow exercise prescription    Comments Kayron is coming to cardiac rehab post stent. He is very motivated to get back to his exercise routine and continue on his journey of a heart healthy lifestyle. He has worked hard to manage his diabetes and make healthy changes. He states he has a wonderful support system of his wife, daughter, and friends from his prayer group. He reports no stress concerns. When asked about sleep, he mentioned he has had a CPAP for 2 years and recently has stopped using it after weight loss and the stent. He is going to follow up with his doctor soon to see if he still needs it. He is ready to start the program to learn more    Expected Outcomes Short: attend cardiac rehab for education and exercise. Long: develop and maintain positive self care habits.    Continue Psychosocial Services  Follow up required by staff             Psychosocial Re-Evaluation:  Psychosocial Re-Evaluation     Row Name 09/30/22 0814 10/09/22 0754 11/20/22 0757 12/09/22 0756       Psychosocial Re-Evaluation   Current issues with Current Stress Concerns;Current Sleep Concerns Current Stress Concerns;Current Sleep Concerns Current Sleep Concerns Current  Sleep Concerns    Comments Kielan is off to a good start in rehab.  His health is his biggest stressor especially his heart. He has been having some pulls and strong beats in the morning and hearing his heart at night.  He is not a good sleeper in general as he wakes multiple times a night but is usually able to get back to sleep.  He usually get between 7-8 hrs of sleep each day. Reviewed patient health questionnaire (PHQ-9) with patient for follow up. Previously, patients score indicated signs/symptoms of depression.  Reviewed to see if patient is improving symptom wise while in program.  Score improved and patient states that it is because they have been feeling better. His sleep issues are chronic is nature, but they continue to cause problems.  He has sleep apnea and does not sleep well which leaves him feeling drained.  He says that his CPAP is just not working as well for him anymore. Kendric is doing well in rehab.  He is feeling good mentally and already going to gym to lift on off days.  He feels that he is really benefiting from the program and in the best shape he has been in over the last 20 years.  He continues to struggle with sleep, but it has always been that way.  He sleeps the best that he can. Sylvanus is doing well in rehab. He continues to feel good mentally.  He is back to gym and exercising 5 days a week.  He continues to work on his sleep.  He has been getting a few good nights each week.  He  is trying to monitor his snoring and sleep on his side versus his back. He feels like he is 75% to where he would like to be.  He still wakes to go to bathroom.  He has enjoyed the regularity of the program and getting back to routine.    Expected Outcomes Short: Attend rehab to learn more about his heart Long: Conitnue to cope with heart and learn new normal Short: Continue to attend [facility] regularly for regular exercise and social engagement. Long: Continue to improve symptoms and manage a positive  mental state. Short: Continue to exercise for energy boost and sleep help Long: Conitnue to stay positive Short: COntinue to work on sleep Long; Conitnue to focus on positive    Interventions Stress management education;Encouraged to attend Cardiac Rehabilitation for the exercise Stress management education;Encouraged to attend Cardiac Rehabilitation for the exercise Stress management education;Encouraged to attend Cardiac Rehabilitation for the exercise Encouraged to attend Cardiac Rehabilitation for the exercise    Continue Psychosocial Services  Follow up required by staff Follow up required by staff Follow up required by staff Follow up required by staff             Psychosocial Discharge (Final Psychosocial Re-Evaluation):  Psychosocial Re-Evaluation - 12/09/22 0756       Psychosocial Re-Evaluation   Current issues with Current Sleep Concerns    Comments Hughey is doing well in rehab. He continues to feel good mentally.  He is back to gym and exercising 5 days a week.  He continues to work on his sleep.  He has been getting a few good nights each week.  He is trying to monitor his snoring and sleep on his side versus his back. He feels like he is 75% to where he would like to be.  He still wakes to go to bathroom.  He has enjoyed the regularity of the program and getting back to routine.    Expected Outcomes Short: COntinue to work on sleep Long; Conitnue to focus on positive    Interventions Encouraged to attend Cardiac Rehabilitation for the exercise    Continue Psychosocial Services  Follow up required by staff             Vocational Rehabilitation: Provide vocational rehab assistance to qualifying candidates.   Vocational Rehab Evaluation & Intervention:  Vocational Rehab - 12/16/22 0812       Discharge Vocational Rehab   Discharge Vocational Rehabilitation no VR requested             Education: Education Goals: Education classes will be provided on a variety of  topics geared toward better understanding of heart health and risk factor modification. Participant will state understanding/return demonstration of topics presented as noted by education test scores.  Learning Barriers/Preferences:  Learning Barriers/Preferences - 09/09/22 1307       Learning Barriers/Preferences   Learning Barriers None    Learning Preferences None             General Cardiac Education Topics:  AED/CPR: - Group verbal and written instruction with the use of models to demonstrate the basic use of the AED with the basic ABC's of resuscitation.   Anatomy and Cardiac Procedures: - Group verbal and visual presentation and models provide information about basic cardiac anatomy and function. Reviews the testing methods done to diagnose heart disease and the outcomes of the test results. Describes the treatment choices: Medical Management, Angioplasty, or Coronary Bypass Surgery for treating various heart conditions including Myocardial  Infarction, Angina, Valve Disease, and Cardiac Arrhythmias.  Written material given at graduation. Flowsheet Row Cardiac Rehab from 11/06/2022 in [facility] Cardiac and Pulmonary Rehab  Education need identified 09/17/22  Date 10/23/22  Educator SB  Instruction Review Code 1- Verbalizes Understanding       Medication Safety: - Group verbal and visual instruction to review commonly prescribed medications for heart and lung disease. Reviews the medication, class of the drug, and side effects. Includes the steps to properly store meds and maintain the prescription regimen.  Written material given at graduation. Flowsheet Row Cardiac Rehab from 11/06/2022 in [facility] Cardiac and Pulmonary Rehab  Date 11/06/22  Educator [facility]  Instruction Review Code 1- Verbalizes Understanding       Intimacy: - Group verbal instruction through game format to discuss how heart and lung disease can affect sexual intimacy. Written material given at  graduation..   Know Your Numbers and Heart Failure: - Group verbal and visual instruction to discuss disease risk factors for cardiac and pulmonary disease and treatment options.  Reviews associated critical values for Overweight/Obesity, Hypertension, Cholesterol, and Diabetes.  Discusses basics of heart failure: signs/symptoms and treatments.  Introduces Heart Failure Zone chart for action plan for heart failure.  Written material given at graduation.   Infection Prevention: - Provides verbal and written material to individual with discussion of infection control including proper hand washing and proper equipment cleaning during exercise session. Flowsheet Row Cardiac Rehab from 11/06/2022 in [facility] Cardiac and Pulmonary Rehab  Date 09/17/22  Educator [facility]  Instruction Review Code 1- Verbalizes Understanding       Falls Prevention: - Provides verbal and written material to individual with discussion of falls prevention and safety. Flowsheet Row Cardiac Rehab from 11/06/2022 in [facility] Cardiac and Pulmonary Rehab  Date 09/17/22  Educator [facility]  Instruction Review Code 1- Verbalizes Understanding       Other: -Provides group and verbal instruction on various topics (see comments) Flowsheet Row Cardiac Rehab from 11/06/2022 in [facility] Cardiac and Pulmonary Rehab  Date 10/30/22  Educator SB  Instruction Review Code 1- Verbalizes Understanding       Knowledge Questionnaire Score:  Knowledge Questionnaire Score - 12/16/22 7829       Knowledge Questionnaire Score   Pre Score 23/26    Post Score 26/26             Core Components/Risk Factors/Patient Goals at Admission:  Personal Goals and Risk Factors at Admission - 09/17/22 1335       Core Components/Risk Factors/Patient Goals on Admission    Weight Management Yes    Intervention Weight Management: Develop a combined nutrition and exercise program designed to reach desired caloric intake, while maintaining appropriate intake of  nutrient and fiber, sodium and fats, and appropriate energy expenditure required for the weight goal.;Weight Management: Provide education and appropriate resources to help participant work on and attain dietary goals.    Admit Weight 172 lb (78 kg)    Goal Weight: Short Term 170 lb (77.1 kg)    Goal Weight: Long Term 170 lb (77.1 kg)    Expected Outcomes Short Term: Continue to assess and modify interventions until short term weight is achieved;Long Term: Adherence to nutrition and physical activity/exercise program aimed toward attainment of established weight goal;Weight Maintenance: Understanding of the daily nutrition guidelines, which includes 25-35% calories from fat, 7% or less cal from saturated fats, less than 200mg  cholesterol, less than 1.5gm of sodium, & 5 or more servings of fruits and vegetables  daily;Understanding recommendations for meals to include 15-35% energy as protein, 25-35% energy from fat, 35-60% energy from carbohydrates, less than 200mg  of dietary cholesterol, 20-35 gm of total fiber daily;Understanding of distribution of calorie intake throughout the day with the consumption of 4-5 meals/snacks    Diabetes Yes    Intervention Provide education about signs/symptoms and action to take for hypo/hyperglycemia.;Provide education about proper nutrition, including hydration, and aerobic/resistive exercise prescription along with prescribed medications to achieve blood glucose in normal ranges: Fasting glucose 65-99 mg/dL    Expected Outcomes Short Term: Participant verbalizes understanding of the signs/symptoms and immediate care of hyper/hypoglycemia, proper foot care and importance of medication, aerobic/resistive exercise and nutrition plan for blood glucose control.;Long Term: Attainment of HbA1C < 7%.    Hypertension Yes    Intervention Provide education on lifestyle modifcations including regular physical activity/exercise, weight management, moderate sodium restriction and  increased consumption of fresh fruit, vegetables, and low fat dairy, alcohol moderation, and smoking cessation.;Monitor prescription use compliance.    Expected Outcomes Short Term: Continued assessment and intervention until BP is < 140/44mm HG in hypertensive participants. < 130/10mm HG in hypertensive participants with diabetes, heart failure or chronic kidney disease.;Long Term: Maintenance of blood pressure at goal levels.             Education:Diabetes - Individual verbal and written instruction to review signs/symptoms of diabetes, desired ranges of glucose level fasting, after meals and with exercise. Acknowledge that pre and post exercise glucose checks will be done for 3 sessions at entry of program. Flowsheet Row Cardiac Rehab from 11/06/2022 in [facility] Cardiac and Pulmonary Rehab  Date 09/17/22  Educator [facility]  Instruction Review Code 1- Verbalizes Understanding       Core Components/Risk Factors/Patient Goals Review:   Goals and Risk Factor Review     Row Name 09/30/22 0821 10/21/22 0755 11/20/22 0758 12/09/22 0758       Core Components/Risk Factors/Patient Goals Review   Personal Goals Review Weight Management/Obesity;Hypertension;Diabetes Diabetes;Weight Management/Obesity Diabetes;Weight Management/Obesity;Hypertension Diabetes;Weight Management/Obesity;Hypertension    Review Gaetano is doing well in rehab thus far.  He would like to lose a little more weight, but recently it has been holding steady. He is eating a keto/low carb diet with his diabetes.  His at home test has his A1c down to 5.5.  He is happy with where he is but would still like to lose more in his belly.  His pressures are doing well in class.  They are at the lowest that he has every had.  They have ranged between 130-110s for most part on top an in the 60s on the bottom. He does note that he feels tired all the time and does not have any energy.  He was encouraged to talk to doctor. Yunior wants to lose some weight  and get his weight down to 160 pounds. He also wants to reduce his A1c. His A1c last time was 8.1 in the lab test. He did a home A1C test which showed 5.5. He has more labs on February 12th. Johnta is doing well in rehab. His weight has been holding steady.  He would like to lose 5 lb if possible, but also good with being on plateu.  His goal is 160 lb.  He has been able to get his A1c down to 5.7 and still proud of his work. All his other labs looked good too.  His pressures have been doing well.  His numbers have been low for him but  he has not felt symptomatic.  He is feeling the best he ever has over the last 20 yrs. Shimshon is doing well in rehab.  His weight is still steady.  He wants to lose 5 more pounds still.  He really wants to focus on building muscle back again.  His clothes are fitting differently and he is slimmer than he was.  His sugars continue to do well and he is no longer taking anything for it.  He has really appreciated his continous monitoring.  His pressures continue to do well.  He is still on amilodipine for his pressures and he hopes to come off of it too.  We talked about showing his numbers to his doctor to come off the medications.  He is feeling much improved than when he started.    Expected Outcomes Short: Continue to keep close eye on sugars and talk to doctor about energy levels Long: COnitnue to work on loweing blood pressures Short: Change diet, lose some weight and watch blood sugar. Long: Reach weight goal and reduce A1C. Short: Continue to work on weight loss Long: Conitnue to manage diabetes Short: Continue to work on muscle Long: COntiue to montior pressures.             Core Components/Risk Factors/Patient Goals at Discharge (Final Review):   Goals and Risk Factor Review - 12/09/22 0758       Core Components/Risk Factors/Patient Goals Review   Personal Goals Review Diabetes;Weight Management/Obesity;Hypertension    Review Katherine is doing well in rehab.  His weight  is still steady.  He wants to lose 5 more pounds still.  He really wants to focus on building muscle back again.  His clothes are fitting differently and he is slimmer than he was.  His sugars continue to do well and he is no longer taking anything for it.  He has really appreciated his continous monitoring.  His pressures continue to do well.  He is still on amilodipine for his pressures and he hopes to come off of it too.  We talked about showing his numbers to his doctor to come off the medications.  He is feeling much improved than when he started.    Expected Outcomes Short: Continue to work on muscle Long: COntiue to montior pressures.             ITP Comments:  ITP Comments     Row Name 09/09/22 1318 09/17/22 1244 09/18/22 0921 10/08/22 1045 11/05/22 1501   ITP Comments Initial phone call completed. Diagnosis can be found in [facility] 12/14. EP Orientation scheduled for Wednesday 1/3 at 10:30. Completed and gym orientation. Initial ITP created and sent for review to Dr. Bethann Punches, Medical Director. First full day of exercise!  Patient was oriented to gym and equipment including functions, settings, policies, and procedures.  Patient's individual exercise prescription and treatment plan were reviewed.  All starting workloads were established based on the results of the 6 minute walk test done at initial orientation visit.  The plan for exercise progression was also introduced and progression will be customized based on patient's performance and goals. 30 Day review completed. Medical Director ITP review done, changes made as directed, and signed approval by Medical Director.    new to program 30 day review completed. ITP sent to Dr. Bethann Punches, Medical Director of Cardiac Rehab. Continue with ITP unless changes are made by physician.    Row Name 12/03/22 [phone] 12/31/22 1404  ITP Comments 30 Day review completed. Medical Director ITP review done, changes made as directed, and signed  approval by Medical Director. 30 day review completed. ITP sent to Dr. Bethann Punches, Medical Director of Cardiac Rehab. Continue with ITP unless changes are made by physician.               Comments: 30 day review

## 2023-01-01 ENCOUNTER — Encounter: Payer: PPO | Admitting: *Deleted

## 2023-01-01 DIAGNOSIS — Z955 Presence of coronary angioplasty implant and graft: Secondary | ICD-10-CM | POA: Diagnosis not present

## 2023-01-01 NOTE — Progress Notes (Signed)
Daily Session Note  Patient Details  Name: Marcus Huang MRN: [id number] Date of Birth: 03/04/1951 Referring Provider:   Flowsheet Row Cardiac Rehab from 09/17/2022 in [facility] Cardiac and Pulmonary Rehab  Referring Provider Paraschos/Fath       Encounter Date: 01/01/2023  Check In:  Session Check In - 01/01/23 0751       Check-In   Supervising physician immediately available to respond to emergencies See telemetry face sheet for immediately available ER MD    Location [facility]-Cardiac & Pulmonary Rehab    Staff Present Lanny Hurst, RN, ADN;Jessica Juanetta Gosling, MA, RCEP, CCRP, CCET;Joseph [place], [place]    Virtual Visit No    Medication changes reported     No    Fall or balance concerns reported    No    Warm-up and Cool-down Performed on first and last piece of equipment    Resistance Training Performed Yes    VAD Patient? No    PAD/SET Patient? No      Pain Assessment   Currently in Pain? No/denies                Social History   Tobacco Use  Smoking Status Never  Smokeless Tobacco Never    Goals Met:  Independence with exercise equipment Exercise tolerated well No report of concerns or symptoms today Strength training completed today  Goals Unmet:  Not Applicable  Comments: Pt able to follow exercise prescription today without complaint.  Will continue to monitor for progression.    Dr. Bethann Punches is Medical Director for [facility] Cardiac Rehabilitation.  Dr. Vida Rigger is Medical Director for [facility] Pulmonary Rehabilitation.

## 2023-01-06 ENCOUNTER — Encounter: Payer: PPO | Admitting: *Deleted

## 2023-01-06 DIAGNOSIS — Z955 Presence of coronary angioplasty implant and graft: Secondary | ICD-10-CM

## 2023-01-06 NOTE — Progress Notes (Signed)
Daily Session Note  Patient Details  Name: Marcus Huang MRN: [id number] Date of Birth: 1951/09/08 Referring Provider:   Flowsheet Row Cardiac Rehab from 09/17/2022 in [facility] Cardiac and Pulmonary Rehab  Referring Provider Paraschos/Fath       Encounter Date: 01/06/2023  Check In:  Session Check In - 01/06/23 0756       Check-In   Supervising physician immediately available to respond to emergencies See telemetry face sheet for immediately available ER MD    Location [facility]-Cardiac & Pulmonary Rehab    Staff Present Lanny Hurst, RN, ADN;Jessica Juanetta Gosling, MA, RCEP, CCRP, Zackery Barefoot, MS, ACSM CEP, Exercise Physiologist    Virtual Visit No    Medication changes reported     No    Fall or balance concerns reported    No    Warm-up and Cool-down Performed on first and last piece of equipment    Resistance Training Performed Yes    VAD Patient? No    PAD/SET Patient? No      Pain Assessment   Currently in Pain? No/denies                Social History   Tobacco Use  Smoking Status Never  Smokeless Tobacco Never    Goals Met:  Independence with exercise equipment Exercise tolerated well No report of concerns or symptoms today Strength training completed today  Goals Unmet:  Not Applicable  Comments:  Marcus Huang graduated today from  rehab with 36 sessions completed.  Details of the patient's exercise prescription and what He needs to do in order to continue the prescription and progress were discussed with patient.  Patient was given a copy of prescription and goals.  Patient verbalized understanding.  Marcus Huang plans to continue to exercise by walking at home.    Dr. Bethann Punches is Medical Director for [facility] Cardiac Rehabilitation.  Dr. Vida Rigger is Medical Director for [facility] Pulmonary Rehabilitation.

## 2023-01-06 NOTE — Progress Notes (Signed)
Discharge Summary: Marcus Huang (DOB: 12-21-1950)   Marcus Huang graduated today from  rehab with 36 sessions completed.  Details of the patient's exercise prescription and what He needs to do in order to continue the prescription and progress were discussed with patient.  Patient was given a copy of prescription and goals.  Patient verbalized understanding.  [name] plans to continue to exercise by walking at home.   6 Minute Walk     Row Name 09/17/22 1246 12/11/22 0809       6 Minute Walk   Phase Initial Discharge    Distance 1300 feet 1910 feet    Distance % Change -- 46.9 %    Distance Feet Change -- 610 ft    Walk Time 6 minutes 6 minutes    # of Rest Breaks 0 0    MPH 2.46 3.62    METS 3.14 4.51    RPE 11 12    Perceived Dyspnea  0 --    VO2 Peak 10.98 15.71    Symptoms No No    Resting HR 62 bpm 57 bpm    Resting BP 148/82 100/58    Resting Oxygen Saturation  99 % 98 %    Exercise Oxygen Saturation  during 6 min walk 98 % 95 %    Max Ex. HR 103 bpm 106 bpm    Max Ex. BP 138/62 174/72    2 Minute Post BP 124/72 --

## 2023-01-06 NOTE — Progress Notes (Signed)
Cardiac Individual Treatment Plan  Patient Details  Name: Marcus Huang MRN: [id number] Date of Birth: Jan 04, 1951 Referring Provider:   Flowsheet Row Cardiac Rehab from 09/17/2022 in [facility] Cardiac and Pulmonary Rehab  Referring Provider Paraschos/Fath       Initial Encounter Date:  Flowsheet Row Cardiac Rehab from 09/17/2022 in [facility] Cardiac and Pulmonary Rehab  Date 09/17/22       Visit Diagnosis: Status post coronary artery stent placement  Patient's Home Medications on Admission:  Current Outpatient Medications:    amLODipine (NORVASC) 2.5 MG tablet, Take 1 tablet (2.5 mg total) by mouth in the morning and at bedtime., Disp: 60 tablet, Rfl: 11   aspirin EC 81 MG tablet, Take 1 tablet (81 mg total) by mouth daily at 12 noon., Disp: 30 tablet, Rfl: 2   atorvastatin (LIPITOR) 80 MG tablet, Take 1 tablet (80 mg total) by mouth at bedtime., Disp: 30 tablet, Rfl: 11   azelastine (ASTELIN) 0.1 % nasal spray, Place into the nose. PRN, Disp: , Rfl:    clopidogrel (PLAVIX) 75 MG tablet, Take 1 tablet (75 mg total) by mouth daily., Disp: 30 tablet, Rfl: 11   hydrochlorothiazide (HYDRODIURIL) 25 MG tablet, Take 1 tablet (25 mg total) by mouth daily., Disp: 30 tablet, Rfl: 11   metoprolol tartrate (LOPRESSOR) 25 MG tablet, Take 0.5 tablets (12.5 mg total) by mouth 2 (two) times daily., Disp: 30 tablet, Rfl: 11   Multiple Vitamin (MULTIVITAMIN) tablet, Take 1 tablet by mouth daily., Disp: , Rfl:    pioglitazone (ACTOS) 30 MG tablet, Take 30 mg by mouth daily., Disp: , Rfl:    PREVIDENT 5000 SENSITIVE 1.1-5 % GEL, Place onto teeth as directed., Disp: , Rfl:    traMADol (ULTRAM) 50 MG tablet, TAKE 1 TABLET(50 MG) BY MOUTH EVERY 6 HOURS AS NEEDED FOR PAIN, Disp: , Rfl:    VITAMIN D PO, Take 5,000 Units by mouth daily., Disp: , Rfl:   Past Medical History: Past Medical History:  Diagnosis Date   Carotid artery thrombosis    Left Carotid Artery   Coronary artery disease    Diabetes (HCC)     Diverticulosis    GERD (gastroesophageal reflux disease)    History of kidney stones    Hyperlipidemia    Hypertension    Renal mass    Sleep apnea    C-PAP    Tobacco Use: Social History   Tobacco Use  Smoking Status Never  Smokeless Tobacco Never    Labs: Review Flowsheet        No data to display           Exercise Target Goals: Exercise Program Goal: Individual exercise prescription set using results from initial 6 min walk test and THRR while considering  patient's activity barriers and safety.   Exercise Prescription Goal: Initial exercise prescription builds to 30-45 minutes a day of aerobic activity, 2-3 days per week.  Home exercise guidelines will be given to patient during program as part of exercise prescription that the participant will acknowledge.   Education: Aerobic Exercise: - Group verbal and visual presentation on the components of exercise prescription. Introduces F.I.T.T principle from ACSM for exercise prescriptions.  Reviews F.I.T.T. principles of aerobic exercise including progression. Written material given at graduation.   Education: Resistance Exercise: - Group verbal and visual presentation on the components of exercise prescription. Introduces F.I.T.T principle from ACSM for exercise prescriptions  Reviews F.I.T.T. principles of resistance exercise including progression. Written material given at graduation. Flowsheet  Row Cardiac Rehab from 11/06/2022 in [facility] Cardiac and Pulmonary Rehab  Date 10/09/22  Educator [facility]  Instruction Review Code 1- Verbalizes Understanding        Education: Exercise & Equipment Safety: - Individual verbal instruction and demonstration of equipment use and safety with use of the equipment. Flowsheet Row Cardiac Rehab from 11/06/2022 in [facility] Cardiac and Pulmonary Rehab  Date 09/17/22  Educator [facility]  Instruction Review Code 1- Verbalizes Understanding       Education: Exercise Physiology & General Exercise  Guidelines: - Group verbal and written instruction with models to review the exercise physiology of the cardiovascular system and associated critical values. Provides general exercise guidelines with specific guidelines to those with heart or lung disease.    Education: Flexibility, Balance, Mind/Body Relaxation: - Group verbal and visual presentation with interactive activity on the components of exercise prescription. Introduces F.I.T.T principle from ACSM for exercise prescriptions. Reviews F.I.T.T. principles of flexibility and balance exercise training including progression. Also discusses the mind body connection.  Reviews various relaxation techniques to help reduce and manage stress (i.e. Deep breathing, progressive muscle relaxation, and visualization). Balance handout provided to take home. Written material given at graduation. Flowsheet Row Cardiac Rehab from 11/06/2022 in [facility] Cardiac and Pulmonary Rehab  Date 10/16/22  Educator [facility]  Instruction Review Code 1- Verbalizes Understanding       Activity Barriers & Risk Stratification:  Activity Barriers & Cardiac Risk Stratification - 09/17/22 1248       Activity Barriers & Cardiac Risk Stratification   Activity Barriers None    Cardiac Risk Stratification Moderate             6 Minute Walk:  6 Minute Walk     Row Name 09/17/22 1246 12/11/22 0809       6 Minute Walk   Phase Initial Discharge    Distance 1300 feet 1910 feet    Distance % Change -- 46.9 %    Distance Feet Change -- 610 ft    Walk Time 6 minutes 6 minutes    # of Rest Breaks 0 0    MPH 2.46 3.62    METS 3.14 4.51    RPE 11 12    Perceived Dyspnea  0 --    VO2 Peak 10.98 15.71    Symptoms No No    Resting HR 62 bpm 57 bpm    Resting BP 148/82 100/58    Resting Oxygen Saturation  99 % 98 %    Exercise Oxygen Saturation  during 6 min walk 98 % 95 %    Max Ex. HR 103 bpm 106 bpm    Max Ex. BP 138/62 174/72    2 Minute Post BP 124/72 --              Oxygen Initial Assessment:   Oxygen Re-Evaluation:   Oxygen Discharge (Final Oxygen Re-Evaluation):   Initial Exercise Prescription:  Initial Exercise Prescription - 09/17/22 1200       Date of Initial Exercise RX and Referring Provider   Date 09/17/22    Referring Provider Paraschos/Fath      Oxygen   Maintain Oxygen Saturation 88% or higher      Treadmill   MPH 2.5    Grade 1    Minutes 15    METs 3.26      Elliptical   Level 1    Speed 3    Minutes 1    METs 3.14  Prescription Details   Frequency (times per week) 2    Duration Progress to 30 minutes of continuous aerobic without signs/symptoms of physical distress      Intensity   THRR 40-80% of Max Heartrate 97-133    Ratings of Perceived Exertion 11-13    Perceived Dyspnea 0-4      Progression   Progression Continue to progress workloads to maintain intensity without signs/symptoms of physical distress.      Resistance Training   Training Prescription Yes    Weight 5    Reps 10-15             Perform Capillary Blood Glucose checks as needed.  Exercise Prescription Changes:   Exercise Prescription Changes     Row Name 09/17/22 1200 09/29/22 0900 09/30/22 0800 10/13/22 1300 10/27/22 0900     Response to Exercise   Blood Pressure (Admit) 148/82 130/64 -- 144/72 102/60   Blood Pressure (Exercise) 138/62 154/64 -- 148/74 134/66   Blood Pressure (Exit) 124/72 124/64 -- 138/70 100/60   Heart Rate (Admit) 62 bpm 70 bpm -- 74 bpm 61 bpm   Heart Rate (Exercise) 103 bpm 103 bpm -- 123 bpm 116 bpm   Heart Rate (Exit) 64 bpm 66 bpm -- 82 bpm 60 bpm   Oxygen Saturation (Admit) 99 % -- -- -- --   Oxygen Saturation (Exercise) 98 % -- -- -- --   Oxygen Saturation (Exit) 98 % -- -- -- --   Rating of Perceived Exertion (Exercise) 11 13 -- 12 14   Perceived Dyspnea (Exercise) 0 -- -- -- --   Symptoms none none -- Stated that "legs feel like jello" none   Comments 6 MWT results 2nd full day  of exercise -- -- --   Duration -- Continue with 30 min of aerobic exercise without signs/symptoms of physical distress. -- Continue with 30 min of aerobic exercise without signs/symptoms of physical distress. Continue with 30 min of aerobic exercise without signs/symptoms of physical distress.   Intensity -- THRR unchanged -- THRR unchanged THRR unchanged     Progression   Progression -- Continue to progress workloads to maintain intensity without signs/symptoms of physical distress. -- Continue to progress workloads to maintain intensity without signs/symptoms of physical distress. Continue to progress workloads to maintain intensity without signs/symptoms of physical distress.   Average METs -- 3.21 -- 4.86 4.97     Resistance Training   Training Prescription -- Yes -- Yes Yes   Weight -- 5 lb -- 5 lb 6 lb   Reps -- 10-15 -- 10-15 10-15     Interval Training   Interval Training -- No -- No Yes   Equipment -- -- -- -- Treadmill;Recumbant Elliptical   Comments -- -- -- -- incline vary 4-8%; L 2.5-5     Treadmill   MPH -- 3.1 -- 3.2 3.4  intervals   Grade -- 3 -- 6 8   Minutes -- 15 -- 15 15   METs -- 4.66 -- 6.1 6.8     Recumbant Elliptical   Level -- 2.3 -- 3 5  intervals   Minutes -- 15 -- 15 15   METs -- 1.9 -- -- 3.7     Elliptical   Level -- 1 -- 1 --   Speed -- 2.9 -- 4 --   Minutes -- 15 -- 15 --   METs -- 2.7 -- 3.2 --     REL-XR   Level -- -- -- 8 8  Minutes -- -- -- 15 15   METs -- -- -- 5 4.4     Home Exercise Plan   Plans to continue exercise at -- -- Home (comment)  walking at home Home (comment)  walking at home Home (comment)  walking at home   Frequency -- -- Add 2 additional days to program exercise sessions. Add 2 additional days to program exercise sessions. Add 2 additional days to program exercise sessions.   Initial Home Exercises Provided -- -- 09/30/22 09/30/22 09/30/22     Oxygen   Maintain Oxygen Saturation -- 88% or higher -- 88% or higher  88% or higher    Row Name 11/10/22 1300 11/24/22 1100 12/08/22 1300 12/22/22 1200 01/05/23 1300     Response to Exercise   Blood Pressure (Admit) 120/68 132/68 132/74 130/64 126/62   Blood Pressure (Exercise) 136/72 -- -- -- --   Blood Pressure (Exit) 138/78 112/66 128/60 126/70 118/60   Heart Rate (Admit) 62 bpm 52 bpm 52 bpm 52 bpm 84 bpm   Heart Rate (Exercise) 108 bpm 97 bpm 112 bpm 119 bpm 101 bpm   Heart Rate (Exit) 72 bpm 58 bpm 70 bpm 62 bpm 67 bpm   Rating of Perceived Exertion (Exercise) 13 13 14 12 13    Symptoms none none none none none   Duration Continue with 30 min of aerobic exercise without signs/symptoms of physical distress. Continue with 30 min of aerobic exercise without signs/symptoms of physical distress. Continue with 30 min of aerobic exercise without signs/symptoms of physical distress. -- Continue with 30 min of aerobic exercise without signs/symptoms of physical distress.   Intensity THRR unchanged THRR unchanged THRR unchanged -- THRR unchanged     Progression   Progression Continue to progress workloads to maintain intensity without signs/symptoms of physical distress. Continue to progress workloads to maintain intensity without signs/symptoms of physical distress. Continue to progress workloads to maintain intensity without signs/symptoms of physical distress. Continue to progress workloads to maintain intensity without signs/symptoms of physical distress. Continue to progress workloads to maintain intensity without signs/symptoms of physical distress.   Average METs 4.74 4.34 5.85 5.23 6.81     Resistance Training   Training Prescription Yes Yes Yes Yes Yes   Weight 8 lb 10 lb 10 lb 12 lb 12 lb   Reps 10-15 10-15 10-15 10-15 10-15     Interval Training   Interval Training Yes Yes Yes Yes Yes   Equipment Treadmill Treadmill Treadmill Treadmill Treadmill   Comments speed 3.2-4.5 mph up to 6% incline\ up to 7% incline 3.2 speed, up to 7% incline 3.2 speed, up  to 7.5% incline     Treadmill   MPH 4.5 3.2 4.6 3.2 5.2   Grade 5.5 6.5 7 7   intervals 7   Minutes 15 15 15 15 15    METs 7.86 6.3 10.8 6.5 10     Recumbant Elliptical   Level 3 3.5 -- -- 4   Minutes 15 15 -- -- 15   METs 2.2 2.4 -- -- --     Elliptical   Level 4 3 3 3 3    Speed 4 4 4  4.8 4.8   Minutes 15 15 15 15 15    METs -- -- 4.1 4.6 4.1     REL-XR   Level 8 6 6  -- --   Minutes 15 15 15  -- --   METs 5.6 -- 5.5 -- --     Home Exercise Plan   Plans to  continue exercise at Home (comment)  walking at home Home (comment)  walking at home Home (comment)  walking at home Home (comment)  walking at home Home (comment)  walking at home   Frequency Add 2 additional days to program exercise sessions. Add 2 additional days to program exercise sessions. Add 2 additional days to program exercise sessions. Add 2 additional days to program exercise sessions. Add 2 additional days to program exercise sessions.   Initial Home Exercises Provided 09/30/22 09/30/22 09/30/22 09/30/22 09/30/22     Oxygen   Maintain Oxygen Saturation 88% or higher 88% or higher 88% or higher 88% or higher 88% or higher            Exercise Comments:   Exercise Comments     Row Name 09/18/22 4098           Exercise Comments First full day of exercise!  Patient was oriented to gym and equipment including functions, settings, policies, and procedures.  Patient's individual exercise prescription and treatment plan were reviewed.  All starting workloads were established based on the results of the 6 minute walk test done at initial orientation visit.  The plan for exercise progression was also introduced and progression will be customized based on patient's performance and goals.                Exercise Goals and Review:   Exercise Goals     Row Name 09/17/22 1252             Exercise Goals   Increase Physical Activity Yes       Intervention Provide advice, education, support and counseling  about physical activity/exercise needs.;Develop an individualized exercise prescription for aerobic and resistive training based on initial evaluation findings, risk stratification, comorbidities and participant's personal goals.       Expected Outcomes Short Term: Attend rehab on a regular basis to increase amount of physical activity.;Long Term: Add in home exercise to make exercise part of routine and to increase amount of physical activity.;Long Term: Exercising regularly at least 3-5 days a week.       Increase Strength and Stamina Yes       Intervention Provide advice, education, support and counseling about physical activity/exercise needs.;Develop an individualized exercise prescription for aerobic and resistive training based on initial evaluation findings, risk stratification, comorbidities and participant's personal goals.       Expected Outcomes Short Term: Increase workloads from initial exercise prescription for resistance, speed, and METs.;Short Term: Perform resistance training exercises routinely during rehab and add in resistance training at home;Long Term: Improve cardiorespiratory fitness, muscular endurance and strength as measured by increased METs and functional capacity ( )       Able to understand and use rate of perceived exertion (RPE) scale Yes       Intervention Provide education and explanation on how to use RPE scale       Expected Outcomes Short Term: Able to use RPE daily in rehab to express subjective intensity level;Long Term:  Able to use RPE to guide intensity level when exercising independently       Able to understand and use Dyspnea scale Yes       Intervention Provide education and explanation on how to use Dyspnea scale       Expected Outcomes Short Term: Able to use Dyspnea scale daily in rehab to express subjective sense of shortness of breath during exertion;Long Term: Able to use Dyspnea scale to guide intensity level when  exercising independently        Knowledge and understanding of Target Heart Rate Range (THRR) Yes       Intervention Provide education and explanation of THRR including how the numbers were predicted and where they are located for reference       Expected Outcomes Short Term: Able to state/look up THRR;Long Term: Able to use THRR to govern intensity when exercising independently;Short Term: Able to use daily as guideline for intensity in rehab       Able to check pulse independently Yes       Intervention Provide education and demonstration on how to check pulse in carotid and radial arteries.;Review the importance of being able to check your own pulse for safety during independent exercise       Expected Outcomes Short Term: Able to explain why pulse checking is important during independent exercise       Understanding of Exercise Prescription Yes       Intervention Provide education, explanation, and written materials on patient's individual exercise prescription       Expected Outcomes Short Term: Able to explain program exercise prescription;Long Term: Able to explain home exercise prescription to exercise independently                Exercise Goals Re-Evaluation :  Exercise Goals Re-Evaluation     Row Name 09/18/22 6578 09/29/22 0934 09/30/22 0813 10/13/22 1344 10/27/22 0922     Exercise Goal Re-Evaluation   Exercise Goals Review Able to understand and use rate of perceived exertion (RPE) scale;Able to understand and use Dyspnea scale;Knowledge and understanding of Target Heart Rate Range (THRR);Understanding of Exercise Prescription Increase Physical Activity;Increase Strength and Stamina;Understanding of Exercise Prescription Increase Physical Activity;Increase Strength and Stamina;Understanding of Exercise Prescription;Able to understand and use Dyspnea scale;Able to check pulse independently;Knowledge and understanding of Target Heart Rate Range (THRR);Able to understand and use rate of perceived exertion (RPE) scale  Increase Physical Activity;Increase Strength and Stamina;Understanding of Exercise Prescription Increase Physical Activity;Increase Strength and Stamina;Understanding of Exercise Prescription   Comments Reviewed RPE scale, THR and program prescription with pt today.  Pt voiced understanding and was given a copy of goals to take home. Jagar is off to a good start with rehab for the first couple of sessions he has been here . He was able to exercise on the elliptical well at about a 3 mph speed. He also increased his workload on the treadmill to a 3.1 mph/ 3% already. RPEs are at an appropriate level. Will continue to monitor as he progresses in the program. Juanmanuel is on day three thus far for rehab. He is doing well with his workloads and starting to bring them up slowly.  He tried out the elliptical for the first time today.  Reviewed home exercise with pt today.  Pt plans to walk at home for exercise.  Reviewed THR, pulse, RPE, sign and symptoms, pulse oximetery and when to call 911 or MD.  Also discussed weather considerations and indoor options.  Pt voiced understanding. Konstantinos continues to do well in rehab. He recently increased his overall average MET level to 4.86 METs. He also increased his treadmill workload to a speed of 3.2 mph and an incline of 6%. He improved to level 3 on the REL and level 8 on the XR as well. We will continue to monitor his progress. Elton continues to do well in rehab. He started doing intervals on the treadmill, working from 4-8% incline, working almost up  to 7 METS! He also started intervals on the REL varying his workload from level 2.5-5. He is now using 6 lbs for handweights. We will continue to monitor.   Expected Outcomes Short: Use RPE daily to regulate intensity. Long: Follow program prescription in THR. Short: Continue to work up endurance on elliptical Long: Continue to increase overall MET level and stamina Short: Start to add in more walking at home to build back up.   long:  Continue to attend rehab regularly Short: Continue to increae treadmill workload. Long: Continue to improve strength and stamina. Short: Continue intervals on both treadmill and REL Long: Continue to increase overall MET level and stamina    Row Name 11/10/22 1322 11/20/22 0754 11/24/22 1135 12/08/22 1354 12/09/22 0754     Exercise Goal Re-Evaluation   Exercise Goals Review Increase Physical Activity;Increase Strength and Stamina;Understanding of Exercise Prescription Increase Physical Activity;Increase Strength and Stamina;Understanding of Exercise Prescription Increase Physical Activity;Increase Strength and Stamina;Understanding of Exercise Prescription Increase Physical Activity;Increase Strength and Stamina;Understanding of Exercise Prescription Increase Physical Activity;Increase Strength and Stamina;Understanding of Exercise Prescription   Comments Bohdi is doing well in rehab. He has increased his intervals on the treadmill working a t a speed ranging from 3.2-4.5 mph with a 5.5% incline. He also improved to level 4 on the elliptical at a speed of 4 mph. He increased from 6 lb to 8 b hand weights for resistance training as well. We will continue to monitor his progress in the program. Marks is doing well in rehab.  He is already going back to [employer] to do some lifting.  He is also using the treadmill at home and will usually use elliptical at gym. He is really wanting to build up his leg strength.  We talked about different exercises that he could do to help it out.  He is going to try reverse lunge  and stair mill to help.  He feels that his upper body and cardio are doing much better. Rody continues to do well in rehab. He increased to using 10 lb for his handweights. He continues to do intervals on the treadmill and increased his max incline to to 6.5%. He is enjoying the ellipitcal as he feels it is helping increase his lower leg muscles. He also increased to 3.5 level on the REL. We will  continue to monitor. Momen is doing well in rehab. He recently increased his overall average MET level to 5.85 METs. He also increased his treadmill workload to a speed of 4.6 mph and an incline of 7%. He is due for his post and will look to improve on it. We will continue to monitor his progress. Max is doing well in rehab.  He is back at [employer] on [place], [place], and Friday.  He is back to lifting weights again and building back up on his weight.  He does feel like his strength is building back up. His stamina is still slow to recover.  He is satisfied with where he is now.  He feels like he has made a lot of progress thus far.   Expected Outcomes Short: Continue to do intervals on the treadmill. Long: Continue to improve strength and stamina. Short: Try reverse lunges Long: Conitnue to improve stamina Short: Continue to increase intervals on treadmill Long: Continue to increase overall MET level and stamina Short: Improve on post . Long: Continue to improve strength and stamina. Short: Continue to go to gym on off days Long:  conitnue to improve strength    Row Name 12/22/22 1228 01/05/23 1332           Exercise Goal Re-Evaluation   Exercise Goals Review Increase Physical Activity;Increase Strength and Stamina;Understanding of Exercise Prescription Increase Physical Activity;Increase Strength and Stamina;Understanding of Exercise Prescription      Comments Lonald continues to do well in rehab. He completed his psot and improved by 46%! He also started using 12 lb for handweights. He continues to do intervals with the treadmill incline. He pushed his speed up to 4.8 on the elliptical. He will be graduating soon and will continue to monitor until then. Dezman is doing well and is ready to graudate from the program. He recently increased his treadmill speed to 5.2 mph while continuing to do intervals of incline up to 7.5%. He also improved to level 4 on the recumbent elliptical. He has one  session left before he graduates from the program.      Expected Outcomes Short: Graduate Long: Continue to exercise independently at home and increase overall stamina Short: Graduate. Long: Continue to exercise independently at home and increase overall stamina.               Discharge Exercise Prescription (Final Exercise Prescription Changes):  Exercise Prescription Changes - 01/05/23 1300       Response to Exercise   Blood Pressure (Admit) 126/62    Blood Pressure (Exit) 118/60    Heart Rate (Admit) 84 bpm    Heart Rate (Exercise) 101 bpm    Heart Rate (Exit) 67 bpm    Rating of Perceived Exertion (Exercise) 13    Symptoms none    Duration Continue with 30 min of aerobic exercise without signs/symptoms of physical distress.    Intensity THRR unchanged      Progression   Progression Continue to progress workloads to maintain intensity without signs/symptoms of physical distress.    Average METs 6.81      Resistance Training   Training Prescription Yes    Weight 12 lb    Reps 10-15      Interval Training   Interval Training Yes    Equipment Treadmill    Comments 3.2 speed, up to 7.5% incline      Treadmill   MPH 5.2    Grade 7    Minutes 15    METs 10      Recumbant Elliptical   Level 4    Minutes 15      Elliptical   Level 3    Speed 4.8    Minutes 15    METs 4.1      Home Exercise Plan   Plans to continue exercise at Home (comment)   walking at home   Frequency Add 2 additional days to program exercise sessions.    Initial Home Exercises Provided 09/30/22      Oxygen   Maintain Oxygen Saturation 88% or higher             Nutrition:  Target Goals: Understanding of nutrition guidelines, daily intake of sodium 1500mg , cholesterol 200mg , calories 30% from fat and 7% or less from saturated fats, daily to have 5 or more servings of fruits and vegetables.  Education: All About Nutrition: -Group instruction provided by verbal, written material,  interactive activities, discussions, models, and posters to present general guidelines for heart healthy nutrition including fat, fiber, MyPlate, the role of sodium in heart healthy nutrition, utilization of the nutrition label, and utilization of  this knowledge for meal planning. Follow up email sent as well. Written material given at graduation. Flowsheet Row Cardiac Rehab from 11/06/2022 in [facility] Cardiac and Pulmonary Rehab  Education need identified 09/17/22       Biometrics:  Pre Biometrics - 09/18/22 0804       Pre Biometrics   Single Leg Stand 23.06 seconds             Post Biometrics - 12/11/22 0810        Post  Biometrics   Height 5\' 8"  (1.727 m)    Weight 172 lb 1.6 oz (78.1 kg)    Waist Circumference 36 inches    Hip Circumference 37.5 inches    Waist to Hip Ratio 0.96 %    BMI (Calculated) 26.17             Nutrition Therapy Plan and Nutrition Goals:  Nutrition Therapy & Goals - 11/13/22 0832       Nutrition Therapy   RD appointment deferred Yes   Amro would not like to meet with RD at this time as he reports he is doing well with his diet and has made many changes so far.     Personal Nutrition Goals   Nutrition Goal Quintan would not like to meet with RD at this time as he reports he is doing well with his diet and has made many changes so far.             Nutrition Assessments:  MEDIFICTS Score Key: ?70 Need to make dietary changes  40-70 Heart Healthy Diet ? 40 Therapeutic Level Cholesterol Diet  Flowsheet Row Cardiac Rehab from 09/17/2022 in [facility] Cardiac and Pulmonary Rehab  Picture Your Plate Total Score on Admission 65      Picture Your Plate Scores: <16 Unhealthy dietary pattern with much room for improvement. 41-50 Dietary pattern unlikely to meet recommendations for good health and room for improvement. 51-60 More healthful dietary pattern, with some room for improvement.  >60 Healthy dietary pattern, although there may be some  specific behaviors that could be improved.    Nutrition Goals Re-Evaluation:  Nutrition Goals Re-Evaluation     Row Name 09/30/22 0819 10/21/22 0753 11/20/22 0758         Goals   Current Weight -- 172 lb (78 kg) --     Nutrition Goal Meet with dietitian.  Make heart healthy changes Work on lowering sugar. Hallie would not like to meet with RD at this time as he reports he is doing well with his diet and has made many changes so far.     Comment Yonael has already started to implement some changes with his diet.  He has been working on getting his A1c down and his at home test has it at 5.5 now versus 8 4 months ago.  He is aiming for low carbs/keto diet.  He keeps a close eye on his sugars.  He is eating a lot of vegetables and greens as well.  He does not eat as much fruit with his diabetes. Branon wants to reduce his A1C. He wants to meet with the dietitian for a lower carb diet. Jaxtin feels that his diet is doing well.  He feels good grasp on what to do and what to eat.  He continues to get in a lot of variety.     Expected Outcome Short; Continue to aim for balance in diet Long: Continue to add variety Short: meet with the dietitian.  Long: Reduce A1C. Short: Continued variety Long: Continue to focus on heart healthy eating              Nutrition Goals Discharge (Final Nutrition Goals Re-Evaluation):  Nutrition Goals Re-Evaluation - 11/20/22 0758       Goals   Nutrition Goal Damen would not like to meet with RD at this time as he reports he is doing well with his diet and has made many changes so far.    Comment Leveon feels that his diet is doing well.  He feels good grasp on what to do and what to eat.  He continues to get in a lot of variety.    Expected Outcome Short: Continued variety Long: Continue to focus on heart healthy eating             Psychosocial: Target Goals: Acknowledge presence or absence of significant depression and/or stress, maximize coping skills, provide  positive support system. Participant is able to verbalize types and ability to use techniques and skills needed for reducing stress and depression.   Education: Stress, Anxiety, and Depression - Group verbal and visual presentation to define topics covered.  Reviews how body is impacted by stress, anxiety, and depression.  Also discusses healthy ways to reduce stress and to treat/manage anxiety and depression.  Written material given at graduation.   Education: Sleep Hygiene -Provides group verbal and written instruction about how sleep can affect your health.  Define sleep hygiene, discuss sleep cycles and impact of sleep habits. Review good sleep hygiene tips.    Initial Review & Psychosocial Screening:  Initial Psych Review & Screening - 09/09/22 1307       Initial Review   Current issues with None Identified      Family Dynamics   Good Support System? Yes   wife, daughter, friends     Barriers   Psychosocial barriers to participate in program There are no identifiable barriers or psychosocial needs.;The patient should benefit from training in stress management and relaxation.      Screening Interventions   Interventions Encouraged to exercise;Provide feedback about the scores to participant;To provide support and resources with identified psychosocial needs    Expected Outcomes Short Term goal: Utilizing psychosocial counselor, staff and physician to assist with identification of specific Stressors or current issues interfering with healing process. Setting desired goal for each stressor or current issue identified.;Long Term Goal: Stressors or current issues are controlled or eliminated.;Long Term goal: The participant improves quality of Life and PHQ9 Scores as seen by post scores and/or verbalization of changes;Short Term goal: Identification and review with participant of any Quality of Life or Depression concerns found by scoring the questionnaire.             Quality of  Life Scores:   Quality of Life - 12/16/22 0812       Quality of Life Scores   Health/Function Pre 26.1 %    Health/Function Post 25.14 %    Health/Function % Change -3.68 %    Socioeconomic Pre 24.38 %    Socioeconomic Post 25 %    Socioeconomic % Change  2.54 %    Psych/Spiritual Pre 23.93 %    Psych/Spiritual Post 24.57 %    Psych/Spiritual % Change 2.67 %    Family Pre 27.3 %    Family Post 24.9 %    Family % Change -8.79 %    GLOBAL Pre 25.44 %    GLOBAL Post 24.95 %  GLOBAL % Change -1.93 %            Scores of 19 and below usually indicate a poorer quality of life in these areas.  A difference of  2-3 points is a clinically meaningful difference.  A difference of 2-3 points in the total score of the Quality of Life Index has been associated with significant improvement in overall quality of life, self-image, physical symptoms, and general health in studies assessing change in quality of life.  PHQ-9: Review Flowsheet       11/06/2022 10/09/2022 09/17/2022  Depression screen PHQ 2/9  Decreased Interest 0 0 0  Down, Depressed, Hopeless 0 1 1  PHQ - 2 Score 0 1 1  Altered sleeping 0 1 3  Tired, decreased energy 1 1 1   Change in appetite 0 0 0  Feeling bad or failure about yourself  0 1 1  Trouble concentrating 1 1 0  Moving slowly or fidgety/restless 1 0 1  Suicidal thoughts 0 0 0  PHQ-9 Score 3 5 7   Difficult doing work/chores Not difficult at all Somewhat difficult Not difficult at all   Interpretation of Total Score  Total Score Depression Severity:  1-4 = Minimal depression, 5-9 = Mild depression, 10-14 = Moderate depression, 15-19 = Moderately severe depression, 20-27 = Severe depression   Psychosocial Evaluation and Intervention:  Psychosocial Evaluation - 09/09/22 1319       Psychosocial Evaluation & Interventions   Interventions Encouraged to exercise with the program and follow exercise prescription    Comments Bentzion is coming to cardiac rehab post  stent. He is very motivated to get back to his exercise routine and continue on his journey of a heart healthy lifestyle. He has worked hard to manage his diabetes and make healthy changes. He states he has a wonderful support system of his wife, daughter, and friends from his prayer group. He reports no stress concerns. When asked about sleep, he mentioned he has had a CPAP for 2 years and recently has stopped using it after weight loss and the stent. He is going to follow up with his doctor soon to see if he still needs it. He is ready to start the program to learn more    Expected Outcomes Short: attend cardiac rehab for education and exercise. Long: develop and maintain positive self care habits.    Continue Psychosocial Services  Follow up required by staff             Psychosocial Re-Evaluation:  Psychosocial Re-Evaluation     Row Name 09/30/22 0814 10/09/22 0754 11/20/22 0757 12/09/22 0756       Psychosocial Re-Evaluation   Current issues with Current Stress Concerns;Current Sleep Concerns Current Stress Concerns;Current Sleep Concerns Current Sleep Concerns Current Sleep Concerns    Comments Kaled is off to a good start in rehab.  His health is his biggest stressor especially his heart. He has been having some pulls and strong beats in the morning and hearing his heart at night.  He is not a good sleeper in general as he wakes multiple times a night but is usually able to get back to sleep.  He usually get between 7-8 hrs of sleep each day. Reviewed patient health questionnaire (PHQ-9) with patient for follow up. Previously, patients score indicated signs/symptoms of depression.  Reviewed to see if patient is improving symptom wise while in program.  Score improved and patient states that it is because they have been feeling better. His  sleep issues are chronic is nature, but they continue to cause problems.  He has sleep apnea and does not sleep well which leaves him feeling drained.  He  says that his CPAP is just not working as well for him anymore. Corben is doing well in rehab.  He is feeling good mentally and already going to gym to lift on off days.  He feels that he is really benefiting from the program and in the best shape he has been in over the last 20 years.  He continues to struggle with sleep, but it has always been that way.  He sleeps the best that he can. Daelan is doing well in rehab. He continues to feel good mentally.  He is back to gym and exercising 5 days a week.  He continues to work on his sleep.  He has been getting a few good nights each week.  He is trying to monitor his snoring and sleep on his side versus his back. He feels like he is 75% to where he would like to be.  He still wakes to go to bathroom.  He has enjoyed the regularity of the program and getting back to routine.    Expected Outcomes Short: Attend rehab to learn more about his heart Long: Conitnue to cope with heart and learn new normal Short: Continue to attend [facility] regularly for regular exercise and social engagement. Long: Continue to improve symptoms and manage a positive mental state. Short: Continue to exercise for energy boost and sleep help Long: Conitnue to stay positive Short: COntinue to work on sleep Long; Conitnue to focus on positive    Interventions Stress management education;Encouraged to attend Cardiac Rehabilitation for the exercise Stress management education;Encouraged to attend Cardiac Rehabilitation for the exercise Stress management education;Encouraged to attend Cardiac Rehabilitation for the exercise Encouraged to attend Cardiac Rehabilitation for the exercise    Continue Psychosocial Services  Follow up required by staff Follow up required by staff Follow up required by staff Follow up required by staff             Psychosocial Discharge (Final Psychosocial Re-Evaluation):  Psychosocial Re-Evaluation - 12/09/22 0756       Psychosocial Re-Evaluation   Current  issues with Current Sleep Concerns    Comments Ceferino is doing well in rehab. He continues to feel good mentally.  He is back to gym and exercising 5 days a week.  He continues to work on his sleep.  He has been getting a few good nights each week.  He is trying to monitor his snoring and sleep on his side versus his back. He feels like he is 75% to where he would like to be.  He still wakes to go to bathroom.  He has enjoyed the regularity of the program and getting back to routine.    Expected Outcomes Short: COntinue to work on sleep Long; Conitnue to focus on positive    Interventions Encouraged to attend Cardiac Rehabilitation for the exercise    Continue Psychosocial Services  Follow up required by staff             Vocational Rehabilitation: Provide vocational rehab assistance to qualifying candidates.   Vocational Rehab Evaluation & Intervention:  Vocational Rehab - 12/16/22 0812       Discharge Vocational Rehab   Discharge Vocational Rehabilitation no VR requested             Education: Education Goals: Education classes will be provided on  a variety of topics geared toward better understanding of heart health and risk factor modification. Participant will state understanding/return demonstration of topics presented as noted by education test scores.  Learning Barriers/Preferences:  Learning Barriers/Preferences - 09/09/22 1307       Learning Barriers/Preferences   Learning Barriers None    Learning Preferences None             General Cardiac Education Topics:  AED/CPR: - Group verbal and written instruction with the use of models to demonstrate the basic use of the AED with the basic ABC's of resuscitation.   Anatomy and Cardiac Procedures: - Group verbal and visual presentation and models provide information about basic cardiac anatomy and function. Reviews the testing methods done to diagnose heart disease and the outcomes of the test results. Describes  the treatment choices: Medical Management, Angioplasty, or Coronary Bypass Surgery for treating various heart conditions including Myocardial Infarction, Angina, Valve Disease, and Cardiac Arrhythmias.  Written material given at graduation. Flowsheet Row Cardiac Rehab from 11/06/2022 in [facility] Cardiac and Pulmonary Rehab  Education need identified 09/17/22  Date 10/23/22  Educator SB  Instruction Review Code 1- Verbalizes Understanding       Medication Safety: - Group verbal and visual instruction to review commonly prescribed medications for heart and lung disease. Reviews the medication, class of the drug, and side effects. Includes the steps to properly store meds and maintain the prescription regimen.  Written material given at graduation. Flowsheet Row Cardiac Rehab from 11/06/2022 in [facility] Cardiac and Pulmonary Rehab  Date 11/06/22  Educator [facility]  Instruction Review Code 1- Verbalizes Understanding       Intimacy: - Group verbal instruction through game format to discuss how heart and lung disease can affect sexual intimacy. Written material given at graduation..   Know Your Numbers and Heart Failure: - Group verbal and visual instruction to discuss disease risk factors for cardiac and pulmonary disease and treatment options.  Reviews associated critical values for Overweight/Obesity, Hypertension, Cholesterol, and Diabetes.  Discusses basics of heart failure: signs/symptoms and treatments.  Introduces Heart Failure Zone chart for action plan for heart failure.  Written material given at graduation.   Infection Prevention: - Provides verbal and written material to individual with discussion of infection control including proper hand washing and proper equipment cleaning during exercise session. Flowsheet Row Cardiac Rehab from 11/06/2022 in [facility] Cardiac and Pulmonary Rehab  Date 09/17/22  Educator [facility]  Instruction Review Code 1- Verbalizes Understanding       Falls Prevention: -  Provides verbal and written material to individual with discussion of falls prevention and safety. Flowsheet Row Cardiac Rehab from 11/06/2022 in [facility] Cardiac and Pulmonary Rehab  Date 09/17/22  Educator [facility]  Instruction Review Code 1- Verbalizes Understanding       Other: -Provides group and verbal instruction on various topics (see comments) Flowsheet Row Cardiac Rehab from 11/06/2022 in [facility] Cardiac and Pulmonary Rehab  Date 10/30/22  Educator SB  Instruction Review Code 1- Verbalizes Understanding       Knowledge Questionnaire Score:  Knowledge Questionnaire Score - 12/16/22 4098       Knowledge Questionnaire Score   Pre Score 23/26    Post Score 26/26             Core Components/Risk Factors/Patient Goals at Admission:  Personal Goals and Risk Factors at Admission - 09/17/22 1335       Core Components/Risk Factors/Patient Goals on Admission    Weight Management Yes  Intervention Weight Management: Develop a combined nutrition and exercise program designed to reach desired caloric intake, while maintaining appropriate intake of nutrient and fiber, sodium and fats, and appropriate energy expenditure required for the weight goal.;Weight Management: Provide education and appropriate resources to help participant work on and attain dietary goals.    Admit Weight 172 lb (78 kg)    Goal Weight: Short Term 170 lb (77.1 kg)    Goal Weight: Long Term 170 lb (77.1 kg)    Expected Outcomes Short Term: Continue to assess and modify interventions until short term weight is achieved;Long Term: Adherence to nutrition and physical activity/exercise program aimed toward attainment of established weight goal;Weight Maintenance: Understanding of the daily nutrition guidelines, which includes 25-35% calories from fat, 7% or less cal from saturated fats, less than 200mg  cholesterol, less than 1.5gm of sodium, & 5 or more servings of fruits and vegetables daily;Understanding recommendations  for meals to include 15-35% energy as protein, 25-35% energy from fat, 35-60% energy from carbohydrates, less than 200mg  of dietary cholesterol, 20-35 gm of total fiber daily;Understanding of distribution of calorie intake throughout the day with the consumption of 4-5 meals/snacks    Diabetes Yes    Intervention Provide education about signs/symptoms and action to take for hypo/hyperglycemia.;Provide education about proper nutrition, including hydration, and aerobic/resistive exercise prescription along with prescribed medications to achieve blood glucose in normal ranges: Fasting glucose 65-99 mg/dL    Expected Outcomes Short Term: Participant verbalizes understanding of the signs/symptoms and immediate care of hyper/hypoglycemia, proper foot care and importance of medication, aerobic/resistive exercise and nutrition plan for blood glucose control.;Long Term: Attainment of HbA1C < 7%.    Hypertension Yes    Intervention Provide education on lifestyle modifcations including regular physical activity/exercise, weight management, moderate sodium restriction and increased consumption of fresh fruit, vegetables, and low fat dairy, alcohol moderation, and smoking cessation.;Monitor prescription use compliance.    Expected Outcomes Short Term: Continued assessment and intervention until BP is < 140/58mm HG in hypertensive participants. < 130/50mm HG in hypertensive participants with diabetes, heart failure or chronic kidney disease.;Long Term: Maintenance of blood pressure at goal levels.             Education:Diabetes - Individual verbal and written instruction to review signs/symptoms of diabetes, desired ranges of glucose level fasting, after meals and with exercise. Acknowledge that pre and post exercise glucose checks will be done for 3 sessions at entry of program. Flowsheet Row Cardiac Rehab from 11/06/2022 in [facility] Cardiac and Pulmonary Rehab  Date 09/17/22  Educator [facility]  Instruction Review Code  1- Verbalizes Understanding       Core Components/Risk Factors/Patient Goals Review:   Goals and Risk Factor Review     Row Name 09/30/22 0821 10/21/22 0755 11/20/22 0758 12/09/22 0758       Core Components/Risk Factors/Patient Goals Review   Personal Goals Review Weight Management/Obesity;Hypertension;Diabetes Diabetes;Weight Management/Obesity Diabetes;Weight Management/Obesity;Hypertension Diabetes;Weight Management/Obesity;Hypertension    Review Raden is doing well in rehab thus far.  He would like to lose a little more weight, but recently it has been holding steady. He is eating a keto/low carb diet with his diabetes.  His at home test has his A1c down to 5.5.  He is happy with where he is but would still like to lose more in his belly.  His pressures are doing well in class.  They are at the lowest that he has every had.  They have ranged between 130-110s for most part on top  an in the 60s on the bottom. He does note that he feels tired all the time and does not have any energy.  He was encouraged to talk to doctor. Jaydyn wants to lose some weight and get his weight down to 160 pounds. He also wants to reduce his A1c. His A1c last time was 8.1 in the lab test. He did a home A1C test which showed 5.5. He has more labs on February 12th. Mico is doing well in rehab. His weight has been holding steady.  He would like to lose 5 lb if possible, but also good with being on plateu.  His goal is 160 lb.  He has been able to get his A1c down to 5.7 and still proud of his work. All his other labs looked good too.  His pressures have been doing well.  His numbers have been low for him but he has not felt symptomatic.  He is feeling the best he ever has over the last 20 yrs. Savian is doing well in rehab.  His weight is still steady.  He wants to lose 5 more pounds still.  He really wants to focus on building muscle back again.  His clothes are fitting differently and he is slimmer than he was.  His sugars  continue to do well and he is no longer taking anything for it.  He has really appreciated his continous monitoring.  His pressures continue to do well.  He is still on amilodipine for his pressures and he hopes to come off of it too.  We talked about showing his numbers to his doctor to come off the medications.  He is feeling much improved than when he started.    Expected Outcomes Short: Continue to keep close eye on sugars and talk to doctor about energy levels Long: COnitnue to work on loweing blood pressures Short: Change diet, lose some weight and watch blood sugar. Long: Reach weight goal and reduce A1C. Short: Continue to work on weight loss Long: Conitnue to manage diabetes Short: Continue to work on muscle Long: COntiue to montior pressures.             Core Components/Risk Factors/Patient Goals at Discharge (Final Review):   Goals and Risk Factor Review - 12/09/22 0758       Core Components/Risk Factors/Patient Goals Review   Personal Goals Review Diabetes;Weight Management/Obesity;Hypertension    Review Tyrice is doing well in rehab.  His weight is still steady.  He wants to lose 5 more pounds still.  He really wants to focus on building muscle back again.  His clothes are fitting differently and he is slimmer than he was.  His sugars continue to do well and he is no longer taking anything for it.  He has really appreciated his continous monitoring.  His pressures continue to do well.  He is still on amilodipine for his pressures and he hopes to come off of it too.  We talked about showing his numbers to his doctor to come off the medications.  He is feeling much improved than when he started.    Expected Outcomes Short: Continue to work on muscle Long: COntiue to montior pressures.             ITP Comments:  ITP Comments     Row Name 09/09/22 1318 09/17/22 1244 09/18/22 0921 10/08/22 1045 11/05/22 1501   ITP Comments Initial phone call completed. Diagnosis can be found in [facility]  12/14. EP Orientation scheduled  for Wednesday 1/3 at 10:30. Completed and gym orientation. Initial ITP created and sent for review to Dr. Bethann Punches, Medical Director. First full day of exercise!  Patient was oriented to gym and equipment including functions, settings, policies, and procedures.  Patient's individual exercise prescription and treatment plan were reviewed.  All starting workloads were established based on the results of the 6 minute walk test done at initial orientation visit.  The plan for exercise progression was also introduced and progression will be customized based on patient's performance and goals. 30 Day review completed. Medical Director ITP review done, changes made as directed, and signed approval by Medical Director.    new to program 30 day review completed. ITP sent to Dr. Bethann Punches, Medical Director of Cardiac Rehab. Continue with ITP unless changes are made by physician.    Row Name 12/03/22 0933 12/31/22 1404 01/06/23 0757       ITP Comments 30 Day review completed. Medical Director ITP review done, changes made as directed, and signed approval by Medical Director. 30 day review completed. ITP sent to Dr. Bethann Punches, Medical Director of Cardiac Rehab. Continue with ITP unless changes are made by physician. Edem graduated today from  rehab with 36 sessions completed.  Details of the patient's exercise prescription and what He needs to do in order to continue the prescription and progress were discussed with patient.  Patient was given a copy of prescription and goals.  Patient verbalized understanding.  Nakia plans to continue to exercise by walking at home.              Comments: Discharge ITP

## 2023-01-23 DIAGNOSIS — R002 Palpitations: Secondary | ICD-10-CM | POA: Diagnosis not present

## 2023-01-23 DIAGNOSIS — Z955 Presence of coronary angioplasty implant and graft: Secondary | ICD-10-CM | POA: Diagnosis not present

## 2023-01-23 DIAGNOSIS — E119 Type 2 diabetes mellitus without complications: Secondary | ICD-10-CM | POA: Diagnosis not present

## 2023-01-23 DIAGNOSIS — I10 Essential (primary) hypertension: Secondary | ICD-10-CM | POA: Diagnosis not present

## 2023-01-23 DIAGNOSIS — I700 Atherosclerosis of aorta: Secondary | ICD-10-CM | POA: Diagnosis not present

## 2023-01-23 DIAGNOSIS — E7800 Pure hypercholesterolemia, unspecified: Secondary | ICD-10-CM | POA: Diagnosis not present

## 2023-01-23 DIAGNOSIS — Z789 Other specified health status: Secondary | ICD-10-CM | POA: Diagnosis not present

## 2023-02-11 DIAGNOSIS — E785 Hyperlipidemia, unspecified: Secondary | ICD-10-CM | POA: Diagnosis not present

## 2023-02-11 DIAGNOSIS — E1165 Type 2 diabetes mellitus with hyperglycemia: Secondary | ICD-10-CM | POA: Diagnosis not present

## 2023-02-11 DIAGNOSIS — R252 Cramp and spasm: Secondary | ICD-10-CM | POA: Diagnosis not present

## 2023-02-16 DIAGNOSIS — M5442 Lumbago with sciatica, left side: Secondary | ICD-10-CM | POA: Diagnosis not present

## 2023-02-16 DIAGNOSIS — I10 Essential (primary) hypertension: Secondary | ICD-10-CM | POA: Diagnosis not present

## 2023-02-16 DIAGNOSIS — I700 Atherosclerosis of aorta: Secondary | ICD-10-CM | POA: Diagnosis not present

## 2023-02-16 DIAGNOSIS — G4733 Obstructive sleep apnea (adult) (pediatric): Secondary | ICD-10-CM | POA: Diagnosis not present

## 2023-02-16 DIAGNOSIS — E785 Hyperlipidemia, unspecified: Secondary | ICD-10-CM | POA: Diagnosis not present

## 2023-02-16 DIAGNOSIS — E119 Type 2 diabetes mellitus without complications: Secondary | ICD-10-CM | POA: Diagnosis not present

## 2023-02-16 DIAGNOSIS — G8929 Other chronic pain: Secondary | ICD-10-CM | POA: Diagnosis not present

## 2023-05-15 DIAGNOSIS — E119 Type 2 diabetes mellitus without complications: Secondary | ICD-10-CM | POA: Diagnosis not present

## 2023-05-15 DIAGNOSIS — E785 Hyperlipidemia, unspecified: Secondary | ICD-10-CM | POA: Diagnosis not present

## 2023-05-22 DIAGNOSIS — G4733 Obstructive sleep apnea (adult) (pediatric): Secondary | ICD-10-CM | POA: Diagnosis not present

## 2023-05-22 DIAGNOSIS — T466X5A Adverse effect of antihyperlipidemic and antiarteriosclerotic drugs, initial encounter: Secondary | ICD-10-CM | POA: Diagnosis not present

## 2023-05-22 DIAGNOSIS — Z0000 Encounter for general adult medical examination without abnormal findings: Secondary | ICD-10-CM | POA: Diagnosis not present

## 2023-05-22 DIAGNOSIS — G8929 Other chronic pain: Secondary | ICD-10-CM | POA: Diagnosis not present

## 2023-05-22 DIAGNOSIS — M5442 Lumbago with sciatica, left side: Secondary | ICD-10-CM | POA: Diagnosis not present

## 2023-05-22 DIAGNOSIS — I700 Atherosclerosis of aorta: Secondary | ICD-10-CM | POA: Diagnosis not present

## 2023-05-22 DIAGNOSIS — M791 Myalgia, unspecified site: Secondary | ICD-10-CM | POA: Diagnosis not present

## 2023-05-22 DIAGNOSIS — E119 Type 2 diabetes mellitus without complications: Secondary | ICD-10-CM | POA: Diagnosis not present

## 2023-05-22 DIAGNOSIS — E785 Hyperlipidemia, unspecified: Secondary | ICD-10-CM | POA: Diagnosis not present

## 2023-05-22 DIAGNOSIS — I10 Essential (primary) hypertension: Secondary | ICD-10-CM | POA: Diagnosis not present

## 2023-07-13 DIAGNOSIS — Z789 Other specified health status: Secondary | ICD-10-CM | POA: Diagnosis not present

## 2023-07-13 DIAGNOSIS — Z955 Presence of coronary angioplasty implant and graft: Secondary | ICD-10-CM | POA: Diagnosis not present

## 2023-07-13 DIAGNOSIS — Z23 Encounter for immunization: Secondary | ICD-10-CM | POA: Diagnosis not present

## 2023-07-13 DIAGNOSIS — R002 Palpitations: Secondary | ICD-10-CM | POA: Diagnosis not present

## 2023-07-13 DIAGNOSIS — E7800 Pure hypercholesterolemia, unspecified: Secondary | ICD-10-CM | POA: Diagnosis not present

## 2023-07-13 DIAGNOSIS — R0789 Other chest pain: Secondary | ICD-10-CM | POA: Diagnosis not present

## 2023-07-13 DIAGNOSIS — E119 Type 2 diabetes mellitus without complications: Secondary | ICD-10-CM | POA: Diagnosis not present

## 2023-07-13 DIAGNOSIS — I6522 Occlusion and stenosis of left carotid artery: Secondary | ICD-10-CM | POA: Diagnosis not present

## 2023-07-13 DIAGNOSIS — I10 Essential (primary) hypertension: Secondary | ICD-10-CM | POA: Diagnosis not present

## 2023-07-28 DIAGNOSIS — Z789 Other specified health status: Secondary | ICD-10-CM | POA: Diagnosis not present

## 2023-07-28 DIAGNOSIS — Z23 Encounter for immunization: Secondary | ICD-10-CM | POA: Diagnosis not present

## 2023-07-28 DIAGNOSIS — R002 Palpitations: Secondary | ICD-10-CM | POA: Diagnosis not present

## 2023-07-28 DIAGNOSIS — I10 Essential (primary) hypertension: Secondary | ICD-10-CM | POA: Diagnosis not present

## 2023-07-28 DIAGNOSIS — E119 Type 2 diabetes mellitus without complications: Secondary | ICD-10-CM | POA: Diagnosis not present

## 2023-07-28 DIAGNOSIS — E7800 Pure hypercholesterolemia, unspecified: Secondary | ICD-10-CM | POA: Diagnosis not present

## 2023-07-28 DIAGNOSIS — Z955 Presence of coronary angioplasty implant and graft: Secondary | ICD-10-CM | POA: Diagnosis not present

## 2023-09-01 ENCOUNTER — Other Ambulatory Visit: Payer: Self-pay | Admitting: Physician Assistant

## 2023-09-01 DIAGNOSIS — E782 Mixed hyperlipidemia: Secondary | ICD-10-CM

## 2023-09-01 DIAGNOSIS — I2510 Atherosclerotic heart disease of native coronary artery without angina pectoris: Secondary | ICD-10-CM

## 2023-09-03 ENCOUNTER — Ambulatory Visit
Admission: RE | Admit: 2023-09-03 | Discharge: 2023-09-03 | Disposition: A | Discharge status: Home / Self Care | Payer: PPO | Source: Ambulatory Visit | Attending: Physician Assistant | Admitting: Physician Assistant

## 2023-09-03 DIAGNOSIS — E782 Mixed hyperlipidemia: Secondary | ICD-10-CM | POA: Insufficient documentation

## 2023-09-03 DIAGNOSIS — I2510 Atherosclerotic heart disease of native coronary artery without angina pectoris: Secondary | ICD-10-CM | POA: Insufficient documentation

## 2023-10-13 DIAGNOSIS — R002 Palpitations: Secondary | ICD-10-CM | POA: Diagnosis not present

## 2023-10-13 DIAGNOSIS — I25118 Atherosclerotic heart disease of native coronary artery with other forms of angina pectoris: Secondary | ICD-10-CM | POA: Diagnosis not present

## 2023-10-13 DIAGNOSIS — R9089 Other abnormal findings on diagnostic imaging of central nervous system: Secondary | ICD-10-CM | POA: Diagnosis not present

## 2023-10-13 DIAGNOSIS — Z955 Presence of coronary angioplasty implant and graft: Secondary | ICD-10-CM | POA: Diagnosis not present

## 2023-10-13 DIAGNOSIS — I10 Essential (primary) hypertension: Secondary | ICD-10-CM | POA: Diagnosis not present

## 2023-10-14 DIAGNOSIS — R569 Unspecified convulsions: Secondary | ICD-10-CM | POA: Diagnosis not present

## 2023-10-14 DIAGNOSIS — G4733 Obstructive sleep apnea (adult) (pediatric): Secondary | ICD-10-CM | POA: Diagnosis not present

## 2023-10-14 DIAGNOSIS — H9313 Tinnitus, bilateral: Secondary | ICD-10-CM | POA: Diagnosis not present

## 2023-10-14 NOTE — Progress Notes (Deleted)
 MRN : [id number]  Marcus Huang is a 73 y.o. (Mar 21, 1951) male who presents with chief complaint of check circulation.  History of Present Illness:   The patient returns to the office for followup and review of the noninvasive studies.    There have been no interval changes in lower extremity symptoms. No interval shortening of the patient's claudication distance or development of rest pain symptoms. No new ulcers or wounds have occurred since the last visit.   There have been no significant changes to the patient's overall health care.   The patient denies amaurosis fugax or recent TIA symptoms. There are no documented recent neurological changes noted. There is no history of DVT, PE or superficial thrombophlebitis. The patient denies recent episodes of angina or shortness of breath.    ABI Rt=1.11 and Lt=1.16 (Previous ABI Rt=*** and Lt=***)   Carotid duplex dated 05/31/2019 demonstrated 1 to 39% bilateral internal carotid artery stenosis.    No outpatient medications have been marked as taking for the 10/15/23 encounter (Appointment) with Gilda Crease, Latina Craver, MD.    Past Medical History:  Diagnosis Date   Carotid artery thrombosis    Left Carotid Artery   Coronary artery disease    Diabetes (HCC)    Diverticulosis    GERD (gastroesophageal reflux disease)    History of kidney stones    Hyperlipidemia    Hypertension    Renal mass    Sleep apnea    C-PAP    Past Surgical History:  Procedure Laterality Date   COLONOSCOPY WITH PROPOFOL N/A 08/15/2019   Procedure: COLONOSCOPY WITH PROPOFOL;  Surgeon: Midge Minium, MD;  Location: [facility];  Service: Endoscopy;  Laterality: N/A;  sleep apnea   CORONARY STENT INTERVENTION N/A 08/05/2022   Procedure: CORONARY STENT INTERVENTION;  Surgeon: Marcina Millard, MD;  Location: [facility];  Service: Cardiovascular;  Laterality: N/A;    CORONARY STENT INTERVENTION Left 08/28/2022   Procedure: CORONARY STENT INTERVENTION;  Surgeon: Marcina Millard, MD;  Location: [facility];  Service: Cardiovascular;  Laterality: Left;   EYE SURGERY Bilateral    LASIK EYE SURGERY   INGUINAL HERNIA REPAIR Bilateral 05/12/2017   Procedure: LAPAROSCOPIC BILATERAL INGUINAL HERNIA REPAIR, umbilical hernia repair;  Surgeon: Lattie Haw, MD;  Location: [facility];  Service: General;  Laterality: Bilateral;   LEFT HEART CATH AND CORONARY ANGIOGRAPHY N/A 08/05/2022   Procedure: LEFT HEART CATH AND CORONARY ANGIOGRAPHY;  Surgeon: Marcina Millard, MD;  Location: [facility];  Service: Cardiovascular;  Laterality: N/A;   none     TONSILLECTOMY      Social History Social History   Tobacco Use   Smoking status: Never   Smokeless tobacco: Never  Vaping Use   Vaping status: Never Used  Substance Use Topics   Alcohol use: No   Drug use: No    Family History Family History  Problem Relation Age of Onset   Hypertension Father    Stroke Father    Coronary artery disease Maternal Grandmother    Stroke Maternal Grandmother    Diabetes Mother  Hypertension Mother    Stroke Mother    Stroke Paternal Grandmother    Prostate cancer Neg Hx    Bladder Cancer Neg Hx     Allergies  Allergen Reactions   Metformin Other (See Comments)    Stomach problems   Rosuvastatin Other (See Comments)    fatigue     REVIEW OF SYSTEMS (Negative unless checked)  Constitutional: [] Weight loss  [] Fever  [] Chills Cardiac: [] Chest pain   [] Chest pressure   [] Palpitations   [] Shortness of breath when laying flat   [] Shortness of breath with exertion. Vascular:  [x] Pain in legs with walking   [] Pain in legs at rest  [] History of DVT   [] Phlebitis   [] Swelling in legs   [] Varicose veins   [] Non-healing ulcers Pulmonary:   [] Uses home oxygen   [] Productive cough   [] Hemoptysis   [] Wheeze  [] COPD   [] Asthma Neurologic:  [] Dizziness    [] Seizures   [] History of stroke   [] History of TIA  [] Aphasia   [] Vissual changes   [] Weakness or numbness in arm   [] Weakness or numbness in leg Musculoskeletal:   [] Joint swelling   [] Joint pain   [] Low back pain Hematologic:  [] Easy bruising  [] Easy bleeding   [] Hypercoagulable state   [] Anemic Gastrointestinal:  [] Diarrhea   [] Vomiting  [] Gastroesophageal reflux/heartburn   [] Difficulty swallowing. Genitourinary:  [] Chronic kidney disease   [] Difficult urination  [] Frequent urination   [] Blood in urine Skin:  [] Rashes   [] Ulcers  Psychological:  [] History of anxiety   []  History of major depression.  Physical Examination  There were no vitals filed for this visit. There is no height or weight on file to calculate BMI. Gen: WD/WN, NAD Head: Elbert/AT, No temporalis wasting.  Ear/Nose/Throat: Hearing grossly intact, nares w/o erythema or drainage Eyes: PER, EOMI, sclera nonicteric.  Neck: Supple, no masses.  No bruit or JVD.  Pulmonary:  Good air movement, no audible wheezing, no use of accessory muscles.  Cardiac: RRR, normal S1, S2, no Murmurs. Vascular:  mild trophic changes, no open wounds Vessel Right Left  Radial Palpable Palpable  PT Not Palpable Not Palpable  DP Not Palpable Not Palpable  Gastrointestinal: soft, non-distended. No guarding/no peritoneal signs.  Musculoskeletal: M/S 5/5 throughout.  No visible deformity.  Neurologic: CN 2-12 intact. Pain and light touch intact in extremities.  Symmetrical.  Speech is fluent. Motor exam as listed above. Psychiatric: Judgment intact, Mood & affect appropriate for pt's clinical situation. Dermatologic: No rashes or ulcers noted.  No changes consistent with cellulitis.   CBC Lab Results  Component Value Date   WBC 7.3 08/06/2022   HGB 13.4 08/06/2022   HCT 38.0 (L) 08/06/2022   MCV 85.6 08/06/2022   PLT 198 08/06/2022    BMET    Component Value Date/Time   NA 137 08/06/2022 0510   K 3.3 (L) 08/06/2022 0510   CL 102  08/06/2022 0510   CO2 28 08/06/2022 0510   GLUCOSE 119 (H) 08/06/2022 0510   BUN 20 08/06/2022 0510   CREATININE 0.94 08/06/2022 0510   CALCIUM 9.0 08/06/2022 0510   GFRNONAA >60 08/06/2022 0510   GFRAA >60 06/01/2019 0822   CrCl cannot be calculated (Patient's most recent lab result is older than the maximum 21 days allowed.).  COAG No results found for: "INR", "PROTIME"  Radiology No results found.   Assessment/Plan There are no diagnoses linked to this encounter.   Levora Dredge, MD  10/14/2023 8:15 AM

## 2023-10-15 ENCOUNTER — Other Ambulatory Visit: Payer: Self-pay | Admitting: Neurology

## 2023-10-15 ENCOUNTER — Ambulatory Visit (INDEPENDENT_AMBULATORY_CARE_PROVIDER_SITE_OTHER): Payer: PPO

## 2023-10-15 ENCOUNTER — Encounter (INDEPENDENT_AMBULATORY_CARE_PROVIDER_SITE_OTHER): Payer: Self-pay

## 2023-10-15 ENCOUNTER — Ambulatory Visit (INDEPENDENT_AMBULATORY_CARE_PROVIDER_SITE_OTHER): Payer: PPO | Admitting: Vascular Surgery

## 2023-10-15 DIAGNOSIS — I70219 Atherosclerosis of native arteries of extremities with intermittent claudication, unspecified extremity: Secondary | ICD-10-CM | POA: Diagnosis not present

## 2023-10-15 DIAGNOSIS — R569 Unspecified convulsions: Secondary | ICD-10-CM

## 2023-10-15 DIAGNOSIS — H9313 Tinnitus, bilateral: Secondary | ICD-10-CM

## 2023-10-15 DIAGNOSIS — I10 Essential (primary) hypertension: Secondary | ICD-10-CM

## 2023-10-15 LAB — VAS US ABI WITH/WO TBI
Left ABI: 1.12
Right ABI: 1.1

## 2023-10-16 DIAGNOSIS — E119 Type 2 diabetes mellitus without complications: Secondary | ICD-10-CM | POA: Diagnosis not present

## 2023-10-16 DIAGNOSIS — I700 Atherosclerosis of aorta: Secondary | ICD-10-CM | POA: Diagnosis not present

## 2023-10-16 DIAGNOSIS — E785 Hyperlipidemia, unspecified: Secondary | ICD-10-CM | POA: Diagnosis not present

## 2023-10-16 DIAGNOSIS — I10 Essential (primary) hypertension: Secondary | ICD-10-CM | POA: Diagnosis not present

## 2023-10-24 ENCOUNTER — Ambulatory Visit
Admission: RE | Admit: 2023-10-24 | Discharge: 2023-10-24 | Disposition: A | Discharge status: Home / Self Care | Payer: PPO | Source: Ambulatory Visit | Attending: Neurology | Admitting: Neurology

## 2023-10-24 ENCOUNTER — Encounter (INDEPENDENT_AMBULATORY_CARE_PROVIDER_SITE_OTHER): Payer: Self-pay

## 2023-10-24 DIAGNOSIS — H93A3 Pulsatile tinnitus, bilateral: Secondary | ICD-10-CM | POA: Diagnosis not present

## 2023-10-24 DIAGNOSIS — R569 Unspecified convulsions: Secondary | ICD-10-CM

## 2023-10-24 DIAGNOSIS — J3489 Other specified disorders of nose and nasal sinuses: Secondary | ICD-10-CM | POA: Diagnosis not present

## 2023-10-24 DIAGNOSIS — H9313 Tinnitus, bilateral: Secondary | ICD-10-CM | POA: Diagnosis not present

## 2023-10-24 DIAGNOSIS — G9389 Other specified disorders of brain: Secondary | ICD-10-CM | POA: Insufficient documentation

## 2023-10-27 DIAGNOSIS — R002 Palpitations: Secondary | ICD-10-CM | POA: Diagnosis not present

## 2023-10-27 DIAGNOSIS — I25118 Atherosclerotic heart disease of native coronary artery with other forms of angina pectoris: Secondary | ICD-10-CM | POA: Diagnosis not present

## 2023-11-02 DIAGNOSIS — R002 Palpitations: Secondary | ICD-10-CM | POA: Diagnosis not present

## 2023-11-02 DIAGNOSIS — Z01810 Encounter for preprocedural cardiovascular examination: Secondary | ICD-10-CM | POA: Diagnosis not present

## 2023-11-02 DIAGNOSIS — Z789 Other specified health status: Secondary | ICD-10-CM | POA: Diagnosis not present

## 2023-11-02 DIAGNOSIS — Z955 Presence of coronary angioplasty implant and graft: Secondary | ICD-10-CM | POA: Diagnosis not present

## 2023-11-02 DIAGNOSIS — R079 Chest pain, unspecified: Secondary | ICD-10-CM | POA: Diagnosis not present

## 2023-11-02 DIAGNOSIS — R9439 Abnormal result of other cardiovascular function study: Secondary | ICD-10-CM | POA: Diagnosis not present

## 2023-11-02 DIAGNOSIS — E7800 Pure hypercholesterolemia, unspecified: Secondary | ICD-10-CM | POA: Diagnosis not present

## 2023-11-02 DIAGNOSIS — I10 Essential (primary) hypertension: Secondary | ICD-10-CM | POA: Diagnosis not present

## 2023-11-03 DIAGNOSIS — Z01818 Encounter for other preprocedural examination: Secondary | ICD-10-CM | POA: Diagnosis not present

## 2023-11-11 ENCOUNTER — Ambulatory Visit
Admission: RE | Admit: 2023-11-11 | Discharge: 2023-11-11 | Disposition: A | Discharge status: Home / Self Care | Payer: PPO | Source: Ambulatory Visit | Attending: Cardiology | Admitting: Cardiology

## 2023-11-11 ENCOUNTER — Encounter
Admission: RE | Disposition: A | Discharge status: Home / Self Care | Payer: Self-pay | Source: Ambulatory Visit | Attending: Cardiology

## 2023-11-11 ENCOUNTER — Encounter: Payer: Self-pay | Admitting: Cardiology

## 2023-11-11 ENCOUNTER — Other Ambulatory Visit: Payer: Self-pay

## 2023-11-11 DIAGNOSIS — Z955 Presence of coronary angioplasty implant and graft: Secondary | ICD-10-CM | POA: Insufficient documentation

## 2023-11-11 DIAGNOSIS — R9430 Abnormal result of cardiovascular function study, unspecified: Secondary | ICD-10-CM | POA: Diagnosis present

## 2023-11-11 DIAGNOSIS — I25110 Atherosclerotic heart disease of native coronary artery with unstable angina pectoris: Secondary | ICD-10-CM | POA: Diagnosis not present

## 2023-11-11 DIAGNOSIS — I2510 Atherosclerotic heart disease of native coronary artery without angina pectoris: Secondary | ICD-10-CM | POA: Diagnosis not present

## 2023-11-11 HISTORY — PX: LEFT HEART CATH AND CORONARY ANGIOGRAPHY: CATH118249

## 2023-11-11 LAB — GLUCOSE, CAPILLARY: Glucose-Capillary: 99 mg/dL (ref 70–99)

## 2023-11-11 SURGERY — LEFT HEART CATH AND CORONARY ANGIOGRAPHY
Anesthesia: Moderate Sedation | Laterality: Left

## 2023-11-11 MED ORDER — SODIUM CHLORIDE 0.9% FLUSH: 3.0000 mL | INTRAVENOUS | Status: DC | PRN

## 2023-11-11 MED ORDER — SODIUM CHLORIDE 0.9 % IV SOLN: 250.0000 mL | INTRAVENOUS | Status: DC | PRN

## 2023-11-11 MED ORDER — MIDAZOLAM HCL 2 MG/2ML IJ SOLN
INTRAMUSCULAR | Status: DC | PRN
  Administered 2023-11-11: 1 mg via INTRAVENOUS

## 2023-11-11 MED ORDER — LABETALOL HCL 5 MG/ML IV SOLN: 10.0000 mg | INTRAVENOUS | Status: DC | PRN

## 2023-11-11 MED ORDER — SODIUM CHLORIDE 0.9 % WEIGHT BASED INFUSION: 1.0000 mL/kg/h | INTRAVENOUS | Status: DC

## 2023-11-11 MED ORDER — HEPARIN (PORCINE) IN NACL 1000-0.9 UT/500ML-% IV SOLN
INTRAVENOUS | Status: AC
  Filled 2023-11-11: qty 1000

## 2023-11-11 MED ORDER — LABETALOL HCL 5 MG/ML IV SOLN
10.0000 mg | INTRAVENOUS | Status: DC | PRN
  Administered 2023-11-11: 10 mg via INTRAVENOUS

## 2023-11-11 MED ORDER — FENTANYL CITRATE (PF) 100 MCG/2ML IJ SOLN
INTRAMUSCULAR | Status: DC | PRN
  Administered 2023-11-11: 25 ug via INTRAVENOUS

## 2023-11-11 MED ORDER — SODIUM CHLORIDE 0.9% FLUSH: 3.0000 mL | Freq: Two times a day (BID) | INTRAVENOUS | Status: DC

## 2023-11-11 MED ORDER — SODIUM CHLORIDE 0.9 % WEIGHT BASED INFUSION
1.0000 mL/kg/h | INTRAVENOUS | Status: DC
  Administered 2023-11-11: 1 mL/kg/h via INTRAVENOUS

## 2023-11-11 MED ORDER — ONDANSETRON HCL 4 MG/2ML IJ SOLN: 4.0000 mg | Freq: Four times a day (QID) | INTRAMUSCULAR | Status: DC | PRN

## 2023-11-11 MED ORDER — HEPARIN SODIUM (PORCINE) 1000 UNIT/ML IJ SOLN
INTRAMUSCULAR | Status: AC
  Filled 2023-11-11: qty 10

## 2023-11-11 MED ORDER — ACETAMINOPHEN 325 MG PO TABS: 650.0000 mg | ORAL_TABLET | ORAL | Status: DC | PRN

## 2023-11-11 MED ORDER — VERAPAMIL HCL 2.5 MG/ML IV SOLN
INTRAVENOUS | Status: AC
  Filled 2023-11-11: qty 2

## 2023-11-11 MED ORDER — LABETALOL HCL 5 MG/ML IV SOLN
INTRAVENOUS | Status: AC
  Filled 2023-11-11: qty 4

## 2023-11-11 MED ORDER — ASPIRIN 81 MG PO CHEW: 81.0000 mg | CHEWABLE_TABLET | ORAL | Status: DC

## 2023-11-11 MED ORDER — MIDAZOLAM HCL 2 MG/2ML IJ SOLN
INTRAMUSCULAR | Status: AC
  Filled 2023-11-11: qty 2

## 2023-11-11 MED ORDER — HYDRALAZINE HCL 20 MG/ML IJ SOLN: 10.0000 mg | INTRAMUSCULAR | Status: DC | PRN

## 2023-11-11 MED ORDER — IOHEXOL 300 MG/ML  SOLN
INTRAMUSCULAR | Status: DC | PRN
  Administered 2023-11-11: 95 mL

## 2023-11-11 MED ORDER — SODIUM CHLORIDE 0.9 % WEIGHT BASED INFUSION: 78.0000 mL/h | INTRAVENOUS | Status: DC

## 2023-11-11 MED ORDER — LIDOCAINE HCL 1 % IJ SOLN
INTRAMUSCULAR | Status: AC
  Filled 2023-11-11: qty 20

## 2023-11-11 MED ORDER — SODIUM CHLORIDE 0.9 % WEIGHT BASED INFUSION
234.0000 mL/h | INTRAVENOUS | Status: AC
Start: 2023-11-11 — End: 2023-11-11
  Administered 2023-11-11: 234 mL/h via INTRAVENOUS

## 2023-11-11 MED ORDER — METOPROLOL TARTRATE 5 MG/5ML IV SOLN
INTRAVENOUS | Status: DC | PRN
  Administered 2023-11-11: 5 mg via INTRAVENOUS

## 2023-11-11 MED ORDER — VERAPAMIL HCL 2.5 MG/ML IV SOLN
INTRAVENOUS | Status: DC | PRN
  Administered 2023-11-11: 2.5 mg via INTRA_ARTERIAL

## 2023-11-11 MED ORDER — METOPROLOL TARTRATE 5 MG/5ML IV SOLN
INTRAVENOUS | Status: AC
  Filled 2023-11-11: qty 5

## 2023-11-11 MED ORDER — HEPARIN (PORCINE) IN NACL 1000-0.9 UT/500ML-% IV SOLN
INTRAVENOUS | Status: DC | PRN
  Administered 2023-11-11 (×2): 500 mL

## 2023-11-11 MED ORDER — HEPARIN SODIUM (PORCINE) 1000 UNIT/ML IJ SOLN
INTRAMUSCULAR | Status: DC | PRN
  Administered 2023-11-11: 3500 [IU] via INTRAVENOUS

## 2023-11-11 MED ORDER — LIDOCAINE HCL (PF) 1 % IJ SOLN
INTRAMUSCULAR | Status: DC | PRN
  Administered 2023-11-11: 2 mL

## 2023-11-11 MED ORDER — FENTANYL CITRATE (PF) 100 MCG/2ML IJ SOLN
INTRAMUSCULAR | Status: AC
  Filled 2023-11-11: qty 2

## 2023-11-11 SURGICAL SUPPLY — 12 items
CATH 5FR JL3.5 JR4 ANG PIG MP (CATHETERS) IMPLANT
DEVICE RAD TR BAND REGULAR (VASCULAR PRODUCTS) IMPLANT
DRAPE BRACHIAL (DRAPES) IMPLANT
GLIDESHEATH SLEND SS 6F .021 (SHEATH) IMPLANT
GUIDEWIRE INQWIRE 1.5J.035X260 (WIRE) IMPLANT
INQWIRE 1.5J .035X260CM (WIRE) ×1 IMPLANT
KIT SYRINGE INJ CVI SPIKEX1 (MISCELLANEOUS) IMPLANT
PACK CARDIAC CATH (CUSTOM PROCEDURE TRAY) ×1 IMPLANT
PROTECTION STATION PRESSURIZED (MISCELLANEOUS) ×1 IMPLANT
SET ATX-X65L (MISCELLANEOUS) IMPLANT
STATION PROTECTION PRESSURIZED (MISCELLANEOUS) IMPLANT
WIRE HITORQ VERSACORE ST 145CM (WIRE) IMPLANT

## 2023-11-11 NOTE — H&P (Signed)
 [facility] Cardiology  CARDIOLOGY CONSULT NOTE  Patient ID: CANAAN HOLZER MRN: [id number] DOB/AGE: 01-18-1951 73 y.o.  Admit date: 11/11/2023 Referring Physician Feldpausch Primary Physician Feldpausch Primary Cardiologist Hannah Crill Reason for Consultation coronary artery disease  HPI: 73 year old gentleman with known coronary disease, status post overlapping DES distal RCA 08/05/2022, with staged PCI and DES distal LAD 08/28/2022.  The patient has experienced recent episodes of heart pounding at night.  72-hour Holter monitor revealed occasional premature atrial contractions and premature ventricular contractions.  2D echocardiogram 10/27/2023 revealed LVEF greater than 55%.  Lexiscan Myoview 10/27/2023 revealed apical, inferior and septal ischemia.  Review of systems complete and found to be negative unless listed above     Past Medical History:  Diagnosis Date   Carotid artery thrombosis    Left Carotid Artery   Coronary artery disease    Diabetes (HCC)    Diverticulosis    GERD (gastroesophageal reflux disease)    History of kidney stones    Hyperlipidemia    Hypertension    Renal mass    Sleep apnea    C-PAP    Past Surgical History:  Procedure Laterality Date   COLONOSCOPY WITH PROPOFOL N/A 08/15/2019   Procedure: COLONOSCOPY WITH PROPOFOL;  Surgeon: Midge Minium, MD;  Location: [facility];  Service: Endoscopy;  Laterality: N/A;  sleep apnea   CORONARY STENT INTERVENTION N/A 08/05/2022   Procedure: CORONARY STENT INTERVENTION;  Surgeon: Marcina Millard, MD;  Location: [facility] INVASIVE CV LAB;  Service: Cardiovascular;  Laterality: N/A;   CORONARY STENT INTERVENTION Left 08/28/2022   Procedure: CORONARY STENT INTERVENTION;  Surgeon: Marcina Millard, MD;  Location: [facility] INVASIVE CV LAB;  Service: Cardiovascular;  Laterality: Left;   EYE SURGERY Bilateral    LASIK EYE SURGERY   INGUINAL HERNIA REPAIR Bilateral 05/12/2017   Procedure: LAPAROSCOPIC BILATERAL INGUINAL  HERNIA REPAIR, umbilical hernia repair;  Surgeon: Lattie Haw, MD;  Location: [facility] ORS;  Service: General;  Laterality: Bilateral;   LEFT HEART CATH AND CORONARY ANGIOGRAPHY N/A 08/05/2022   Procedure: LEFT HEART CATH AND CORONARY ANGIOGRAPHY;  Surgeon: Marcina Millard, MD;  Location: [facility] INVASIVE CV LAB;  Service: Cardiovascular;  Laterality: N/A;   none     TONSILLECTOMY      Medications Prior to Admission  Medication Sig Dispense Refill Last Dose/Taking   amLODipine (NORVASC) 5 MG tablet Take 1 tablet by mouth 2 (two) times daily.   11/11/2023 at  5:55 AM   aspirin EC 81 MG tablet Take 1 tablet (81 mg total) by mouth daily at 12 noon. 30 tablet 2 11/11/2023 at  5:55 AM   azelastine (ASTELIN) 0.1 % nasal spray Place 2 sprays into the nose daily as needed for rhinitis. PRN   Taking As Needed   b complex vitamins capsule Take 1 capsule by mouth daily as needed (energy).   Past Month   metoprolol tartrate (LOPRESSOR) 25 MG tablet Take 0.5 tablets (12.5 mg total) by mouth 2 (two) times daily. 30 tablet 11 11/11/2023 at  5:55 AM   Omega-3 Fatty Acids (OMEGA 3 PO) Take 1 capsule by mouth daily.   11/10/2023   PREVIDENT 5000 SENSITIVE 1.1-5 % GEL Place 1 Application onto teeth daily.   Past Week   traMADol (ULTRAM) 50 MG tablet Take 50 mg by mouth every 6 (six) hours as needed for moderate pain (pain score 4-6).   Past Month   Social History   Socioeconomic History   Marital status: Married    Spouse name: Megan Salon  Number of children: Not on file   Years of education: Not on file   Highest education level: Not on file  Occupational History   Not on file  Tobacco Use   Smoking status: Never   Smokeless tobacco: Never  Vaping Use   Vaping status: Never Used  Substance and Sexual Activity   Alcohol use: No   Drug use: No   Sexual activity: Yes    Birth control/protection: None  Other Topics Concern   Not on file  Social History Narrative   Lives with Megan Salon and no indoor pets.      Social Drivers of Corporate investment banker Strain: Low Risk  (05/17/2023)   Received from [facility]   Overall Financial Resource Strain (CARDIA)    Difficulty of Paying Living Expenses: Not very hard  Food Insecurity: No Food Insecurity (05/17/2023)   Received from [facility]   Hunger Vital Sign    Worried About Running Out of Food in the Last Year: Never true    Ran Out of Food in the Last Year: Never true  Transportation Needs: No Transportation Needs (05/17/2023)   [facility] - Transportation    In the past 12 months, has lack of transportation kept you from medical appointments or from getting medications?: No    Lack of Transportation (Non-Medical): No  Physical Activity: Not on file  Stress: Not on file  Social Connections: Not on file  Intimate Partner Violence: Not At Risk (08/05/2022)   Humiliation, Afraid, Rape, and Kick questionnaire    Fear of Current or Ex-Partner: No    Emotionally Abused: No    Physically Abused: No    Sexually Abused: No    Family History  Problem Relation Age of Onset   Hypertension Father    Stroke Father    Coronary artery disease Maternal Grandmother    Stroke Maternal Grandmother    Diabetes Mother    Hypertension Mother    Stroke Mother    Stroke Paternal Grandmother    Prostate cancer Neg Hx    Bladder Cancer Neg Hx       Review of systems complete and found to be negative unless listed above      PHYSICAL EXAM  General: Well developed, well nourished, in no acute distress HEENT:  Normocephalic and atramatic Neck:  No JVD.  Lungs: Clear bilaterally to auscultation and percussion. Heart: HRRR . Normal S1 and S2 without gallops or murmurs.  Abdomen: Bowel sounds are positive, abdomen soft and non-tender  Msk:  Back normal, normal gait. Normal strength and tone for age. Extremities: No clubbing, cyanosis or edema.   Neuro: Alert and oriented X  3. Psych:  Good affect, responds appropriately  Labs:   Lab Results  Component Value Date   WBC 7.3 08/06/2022   HGB 13.4 08/06/2022   HCT 38.0 (L) 08/06/2022   MCV 85.6 08/06/2022   PLT 198 08/06/2022   No results for input(s): "NA", "K", "CL", "CO2", "BUN", "CREATININE", "CALCIUM", "PROT", "BILITOT", "ALKPHOS", "ALT", "AST", "GLUCOSE" in the last 168 hours.  Invalid input(s): "LABALBU" No results found for: "CKTOTAL", "CKMB", "CKMBINDEX", "TROPONINI" No results found for: "CHOL" No results found for: "HDL" No results found for: "LDLCALC" No results found for: "TRIG" No results found for: "CHOLHDL" No results found for: "LDLDIRECT"    Radiology: MR MRV HEAD WO CM Result Date: 11/06/2023 CLINICAL DATA:  Bilateral pulsatile tinnitus. EXAM: MRI  HEAD WITHOUT CONTRAST MRV HEAD WITHOUT CONTRAST TECHNIQUE: Multiplanar, multi-echo pulse sequences of the brain and surrounding structures were acquired without intravenous contrast. Angiographic images of the intracranial venous structures were acquired using MRV technique without intravenous contrast. COMPARISON:  MRI brain 07/15/2022. FINDINGS: MRI HEAD WITHOUT CONTRAST Brain: No acute infarct or hemorrhage. 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 Background of mild chronic small-vessel disease, unchanged. No hydrocephalus or extra-axial collection. No mass or abnormal susceptibility. Vascular: Normal flow voids. Skull and upper cervical spine: Normal marrow signal. Sinuses/Orbits: No acute findings. Other: None. 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 No evidence of dural venous sinus thrombosis. IMPRESSION: 1. No acute intracranial abnormality or mass. 2. 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 No evidence of dural venous sinus thrombosis. Electronically Signed   By: Orvan Falconer M.D.   On: 11/06/2023 08:33   MR  BRAIN WO CONTRAST Result Date: 11/06/2023 CLINICAL DATA:  Bilateral pulsatile tinnitus. EXAM: MRI HEAD WITHOUT CONTRAST MRV HEAD WITHOUT CONTRAST TECHNIQUE: Multiplanar, multi-echo pulse sequences of the brain and surrounding structures were acquired without intravenous contrast. Angiographic images of the intracranial venous structures were acquired using MRV technique without intravenous contrast. COMPARISON:  MRI brain 07/15/2022. FINDINGS: MRI HEAD WITHOUT CONTRAST Brain: No acute infarct or hemorrhage. 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 Background of mild chronic small-vessel disease, unchanged. No hydrocephalus or extra-axial collection. No mass or abnormal susceptibility. Vascular: Normal flow voids. Skull and upper cervical spine: Normal marrow signal. Sinuses/Orbits: No acute findings. Other: None. 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 No evidence of dural venous sinus thrombosis. IMPRESSION: 1. No acute intracranial abnormality or mass. 2. 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 No evidence of dural venous sinus thrombosis. Electronically Signed   By: Orvan Falconer M.D.   On: 11/06/2023 08:33   VAS US RENAL ARTERY DUPLEX Result Date: 10/15/2023 ABDOMINAL VISCERAL Patient Name:  DIOGO ANNE  Date of Exam:   10/15/2023 Medical Rec #: [id number]       Accession #:    [id number] Date of Birth: 1951/05/03        Patient Gender: M Patient Age:   10 years Exam Location:  [facility] Procedure:      VAS US RENAL ARTERY DUPLEX Referring Phys: [id number] GREGORY G SCHNIER -------------------------------------------------------------------------------- High Risk Factors: Hypertension. Comparison Study: 09/2022 Performing Technologist: Salvadore Farber RVT  Examination Guidelines: A complete evaluation includes B-mode imaging, spectral Doppler, color Doppler, and power  Doppler as needed of all accessible portions of each vessel. Bilateral testing is considered an integral part of a complete examination. Limited examinations for reoccurring indications may be performed as noted.  Duplex Findings: +------------------+--------+--------+-------+ Right Renal ArteryPSV cm/sEDV cm/sComment +------------------+--------+--------+-------+ Proximal             69                   +------------------+--------+--------+-------+ Mid                  77                   +------------------+--------+--------+-------+ Distal               61                   +------------------+--------+--------+-------+ +-----------------+--------+--------+-------+  Left Renal ArteryPSV cm/sEDV cm/sComment +-----------------+--------+--------+-------+ Proximal            72                   +-----------------+--------+--------+-------+ Mid                 68                   +-----------------+--------+--------+-------+ Distal              36                   +-----------------+--------+--------+-------+ +------------+--------+--------+----+-----------+--------+--------+---+ Right KidneyPSV cm/sEDV cm/sRI  Left KidneyPSV cm/sEDV cm/sRI  +------------+--------+--------+----+-----------+--------+--------+---+ Upper Pole                      Upper Pole                     +------------+--------+--------+----+-----------+--------+--------+---+ Mid         30      9       0.                            +------------+--------+--------+----+-----------+--------+--------+---+ Lower Pole                      Lower Pole                     +------------+--------+--------+----+-----------+--------+--------+---+ Hilar                           Hilar                          +------------+--------+--------+----+-----------+--------+--------+---+ +------------------+-----+------------------+-----+ Right Kidney           Left Kidney              +------------------+-----+------------------+-----+ RAR                    RAR                     +------------------+-----+------------------+-----+ RAR (manual)      .75  RAR (manual)      .71   +------------------+-----+------------------+-----+ Cortex                 Cortex                  +------------------+-----+------------------+-----+ Cortex thickness       Corex thickness         +------------------+-----+------------------+-----+ Kidney length (cm)10.60Kidney length (cm)11.15 +------------------+-----+------------------+-----+  Summary: Renal:  Right: Normal size right kidney. Normal right Resisitive Index.        Normal cortical thickness of right kidney. No evidence of        right renal artery stenosis. RRV flow present. Left:  Normal size of left kidney. Normal left Resistive Index.        Normal cortical thickness of the left kidney. No evidence of        left renal artery stenosis. LRV flow present.  *See table(s) above for measurements and observations.  Diagnosing physician: Levora Dredge MD  Electronically signed by Levora Dredge MD on 10/15/2023 at 4:41:19 PM.    Final    VAS [place] ABI WITH/WO TBI Result Date: 10/15/2023  LOWER EXTREMITY DOPPLER  STUDY Patient Name:  HOLMAN BONSIGNORE  Date of Exam:   10/15/2023 Medical Rec #: [id number]       Accession #:    [id number] Date of Birth: [birth year]        Patient Gender: M Patient Age:   31 years Exam Location:  [facility] Procedure:      VAS [place] ABI WITH/WO TBI Referring Phys: --------------------------------------------------------------------------------  High Risk Factors: Hypertension, Diabetes.  Comparison Study: 09/2022 Performing Technologist: Salvadore Farber RVT  Examination Guidelines: A complete evaluation includes at minimum, Doppler waveform signals and systolic blood pressure reading at the level of bilateral brachial, anterior tibial, and posterior tibial arteries, when vessel segments  are accessible. Bilateral testing is considered an integral part of a complete examination. Photoelectric Plethysmograph (PPG) waveforms and toe systolic pressure readings are included as required and additional duplex testing as needed. Limited examinations for reoccurring indications may be performed as noted.  ABI Findings: +---------+------------------+-----+---------+--------+ Right    Rt Pressure (mmHg)IndexWaveform Comment  +---------+------------------+-----+---------+--------+ Brachial 153                                      +---------+------------------+-----+---------+--------+ PTA      165               1.08 triphasic         +---------+------------------+-----+---------+--------+ DP       168               1.10 triphasic         +---------+------------------+-----+---------+--------+ Great Toe139               0.91 Normal            +---------+------------------+-----+---------+--------+ +---------+------------------+-----+---------+-------+ Left     Lt Pressure (mmHg)IndexWaveform Comment +---------+------------------+-----+---------+-------+ Brachial 150                                     +---------+------------------+-----+---------+-------+ PTA      172               1.12 triphasic        +---------+------------------+-----+---------+-------+ DP       166               1.08 triphasic        +---------+------------------+-----+---------+-------+ Berline Chough               0.87 Normal           +---------+------------------+-----+---------+-------+ +-------+-----------+-----------+------------+------------+ ABI/TBIToday's ABIToday's TBIPrevious ABIPrevious TBI +-------+-----------+-----------+------------+------------+ Right  1.10       .91        1.11        .98          +-------+-----------+-----------+------------+------------+ Left   1.12       .87        1.16        .92           +-------+-----------+-----------+------------+------------+ Bilateral ABIs and TBIs appear essentially unchanged compared to prior study on 09/2022.  Summary: Right: Resting right ankle-brachial index is within normal range. The right toe-brachial index is normal. Left: Resting left ankle-brachial index is within normal range. The left toe-brachial index is normal. *See table(s) above for measurements and observations.  Electronically signed by Levora Dredge MD on 10/15/2023 at 4:39:10 PM.  Final     EKG:   ASSESSMENT AND PLAN:   73 year old gentleman with known coronary disease, with history of overlapping DES distal RCA, and DES distal LAD with recent Lexiscan Myoview revealing apical, inferior and septal wall ischemia.  Recommendations  Proceed with left heart cardiac catheterization.  The risk, benefits alternatives of cardiac catheterization and possible percutaneous coronary intervention were explained to the patient and informed consent was obtained.  Signed: Marcina Millard MD,PhD, [facility] 11/11/2023, 7:20 AM

## 2023-11-11 NOTE — Discharge Instructions (Signed)
 Radial Site Care Refer to this sheet in the next few weeks. These instructions provide you with information about caring for yourself after your procedure. Your health care provider may also give you more specific instructions. Your treatment has been planned according to current medical practices, but problems sometimes occur. Call your health care provider if you have any problems or questions after your procedure. What can I expect after the procedure? After your procedure, it is typical to have the following: Bruising at the radial site that usually fades within 1-2 weeks. Blood collecting in the tissue (hematoma) that may be painful to the touch. It should usually decrease in size and tenderness within 1-2 weeks.  Follow these instructions at home: Take medicines only as directed by your health care provider. If you are on a medication called Metformin please do not take for 48 hours after your procedure. Over the next 48hrs please increase your fluid intake of water and non caffeine beverages to flush the contrast dye out of your system.  You may shower 24 hours after the procedure  Leave your bandage on and gently wash the site with plain soap and water. Pat the area dry with a clean towel. Do not rub the site, because this may cause bleeding.  Remove your dressing 48hrs after your procedure and leave open to air.  Do not submerge your site in water for 7 days. This includes swimming and washing dishes.  Check your insertion site every day for redness, swelling, or drainage. Do not apply powder or lotion to the site. Do not flex or bend the affected arm for 24 hours or as directed by your health care provider. Do not push or pull heavy objects with the affected arm for 24 hours or as directed by your health care provider. Do not lift over 10 lb (4.5 kg) for 5 days after your procedure or as directed by your health care provider. Ask your health care provider when it is okay to: Return to  work or school. Resume usual physical activities or sports. Resume sexual activity. Do not drive home if you are discharged the same day as the procedure. Have someone else drive you. You may drive 48 hours after the procedure Do not operate machinery or power tools for 24 hours after the procedure. If your procedure was done as an outpatient procedure, which means that you went home the same day as your procedure, a responsible adult should be with you for the first 24 hours after you arrive home. Keep all follow-up visits as directed by your health care provider. This is important. Contact a health care provider if: You have a fever. You have chills. You have increased bleeding from the radial site. Hold pressure on the site. Get help right away if: You have unusual pain at the radial site. You have redness, warmth, or swelling at the radial site. You have drainage (other than a small amount of blood on the dressing) from the radial site. The radial site is bleeding, and the bleeding does not stop after 15 minutes of holding steady pressure on the site. Your arm or hand becomes pale, cool, tingly, or numb. This information is not intended to replace advice given to you by your health care provider. Make sure you discuss any questions you have with your health care provider. Document Released: 10/04/2010 Document Revised: 02/07/2016 Document Reviewed: 03/20/2014 Elsevier Interactive Patient Education  2018 [employer].

## 2023-11-17 DIAGNOSIS — E7800 Pure hypercholesterolemia, unspecified: Secondary | ICD-10-CM | POA: Diagnosis not present

## 2023-11-17 DIAGNOSIS — R002 Palpitations: Secondary | ICD-10-CM | POA: Diagnosis not present

## 2023-11-17 DIAGNOSIS — I6522 Occlusion and stenosis of left carotid artery: Secondary | ICD-10-CM | POA: Diagnosis not present

## 2023-11-17 DIAGNOSIS — Z23 Encounter for immunization: Secondary | ICD-10-CM | POA: Diagnosis not present

## 2023-11-17 DIAGNOSIS — Z789 Other specified health status: Secondary | ICD-10-CM | POA: Diagnosis not present

## 2023-11-17 DIAGNOSIS — I700 Atherosclerosis of aorta: Secondary | ICD-10-CM | POA: Diagnosis not present

## 2023-11-17 DIAGNOSIS — Z955 Presence of coronary angioplasty implant and graft: Secondary | ICD-10-CM | POA: Diagnosis not present

## 2023-11-17 DIAGNOSIS — E119 Type 2 diabetes mellitus without complications: Secondary | ICD-10-CM | POA: Diagnosis not present

## 2023-11-17 DIAGNOSIS — I10 Essential (primary) hypertension: Secondary | ICD-10-CM | POA: Diagnosis not present

## 2023-11-20 DIAGNOSIS — R0602 Shortness of breath: Secondary | ICD-10-CM | POA: Diagnosis not present

## 2023-11-20 DIAGNOSIS — I2510 Atherosclerotic heart disease of native coronary artery without angina pectoris: Secondary | ICD-10-CM | POA: Diagnosis not present

## 2023-11-20 DIAGNOSIS — R569 Unspecified convulsions: Secondary | ICD-10-CM | POA: Diagnosis not present

## 2023-11-24 DIAGNOSIS — E785 Hyperlipidemia, unspecified: Secondary | ICD-10-CM | POA: Diagnosis not present

## 2023-11-24 DIAGNOSIS — E119 Type 2 diabetes mellitus without complications: Secondary | ICD-10-CM | POA: Diagnosis not present

## 2023-12-01 DIAGNOSIS — F418 Other specified anxiety disorders: Secondary | ICD-10-CM | POA: Diagnosis not present

## 2023-12-01 DIAGNOSIS — I700 Atherosclerosis of aorta: Secondary | ICD-10-CM | POA: Diagnosis not present

## 2023-12-01 DIAGNOSIS — I10 Essential (primary) hypertension: Secondary | ICD-10-CM | POA: Diagnosis not present

## 2023-12-01 DIAGNOSIS — I25119 Atherosclerotic heart disease of native coronary artery with unspecified angina pectoris: Secondary | ICD-10-CM | POA: Diagnosis not present

## 2023-12-01 DIAGNOSIS — M5442 Lumbago with sciatica, left side: Secondary | ICD-10-CM | POA: Diagnosis not present

## 2023-12-01 DIAGNOSIS — M791 Myalgia, unspecified site: Secondary | ICD-10-CM | POA: Diagnosis not present

## 2023-12-01 DIAGNOSIS — E119 Type 2 diabetes mellitus without complications: Secondary | ICD-10-CM | POA: Diagnosis not present

## 2023-12-01 DIAGNOSIS — Z125 Encounter for screening for malignant neoplasm of prostate: Secondary | ICD-10-CM | POA: Diagnosis not present

## 2023-12-01 DIAGNOSIS — G8929 Other chronic pain: Secondary | ICD-10-CM | POA: Diagnosis not present

## 2023-12-01 DIAGNOSIS — G4733 Obstructive sleep apnea (adult) (pediatric): Secondary | ICD-10-CM | POA: Diagnosis not present

## 2023-12-01 DIAGNOSIS — T466X5A Adverse effect of antihyperlipidemic and antiarteriosclerotic drugs, initial encounter: Secondary | ICD-10-CM | POA: Diagnosis not present

## 2023-12-01 DIAGNOSIS — E785 Hyperlipidemia, unspecified: Secondary | ICD-10-CM | POA: Diagnosis not present

## 2023-12-02 ENCOUNTER — Institutional Professional Consult (permissible substitution): Admitting: Surgery

## 2023-12-02 ENCOUNTER — Other Ambulatory Visit: Payer: Self-pay | Admitting: *Deleted

## 2023-12-02 ENCOUNTER — Encounter: Payer: Self-pay | Admitting: *Deleted

## 2023-12-02 ENCOUNTER — Encounter: Payer: Self-pay | Admitting: Surgery

## 2023-12-02 VITALS — BP 163/89 | HR 62 | Resp 18 | Ht 68.0 in | Wt 161.0 lb

## 2023-12-02 DIAGNOSIS — I25119 Atherosclerotic heart disease of native coronary artery with unspecified angina pectoris: Secondary | ICD-10-CM

## 2023-12-02 NOTE — Progress Notes (Unsigned)
 Cardiothoracic Surgery Consultation   PCP is Huang, Marcus Guthrie, MD Referring Provider is Marcus Millard, MD  Chief Complaint  Patient presents with  . Coronary Artery Disease    HPI:   Past Medical History:  Diagnosis Date  . Carotid artery thrombosis    Left Carotid Artery  . Coronary artery disease   . Diabetes (HCC)   . Diverticulosis   . GERD (gastroesophageal reflux disease)   . History of kidney stones   . Hyperlipidemia   . Hypertension   . Renal mass   . Sleep apnea    C-PAP    Past Surgical History:  Procedure Laterality Date  . COLONOSCOPY WITH PROPOFOL N/A 08/15/2019   Procedure: COLONOSCOPY WITH PROPOFOL;  Surgeon: Marcus Minium, MD;  Location: [facility];  Service: Endoscopy;  Laterality: N/A;  sleep apnea  . CORONARY STENT INTERVENTION N/A 08/05/2022   Procedure: CORONARY STENT INTERVENTION;  Surgeon: Marcus Millard, MD;  Location: [facility] INVASIVE CV LAB;  Service: Cardiovascular;  Laterality: N/A;  . CORONARY STENT INTERVENTION Left 08/28/2022   Procedure: CORONARY STENT INTERVENTION;  Surgeon: Marcus Millard, MD;  Location: [facility] IN[facility];  Service: Cardiovascular;  Laterality: Left;  . EYE SURGERY Bilateral    LASIK EYE SURGERY  . INGUINAL HERNIA REPAIR Bilateral 05/12/2017   Procedure: LAPAROSCOPIC BILATERAL INGUINAL HERNIA REPAIR, umbilical hernia repair;  Surgeon: Marcus Haw, MD;  Location: [facility] ORS;  Service: General;  Laterality: Bilateral;  . LEFT HEART CATH AND CORONARY ANGIOGRAPHY N/A 08/05/2022   Procedure: LEFT HEART CATH AND CORONARY ANGIOGRAPHY;  Surgeon: Marcus Millard, MD;  Location: [facility];  Service: Cardiovascular;  Laterality: N/A;  . LEFT HEART CATH AND CORONARY ANGIOGRAPHY Left 11/11/2023   Procedure: LEFT HEART CATH AND CORONARY ANGIOGRAPHY;  Surgeon: Marcus Millard, MD;  Location: [facility];  Service: Cardiovascular;  Laterality: Left;  . none    .  TONSILLECTOMY      Family History  Problem Relation Age of Onset  . Hypertension Father   . Stroke Father   . Coronary artery disease Maternal Grandmother   . Stroke Maternal Grandmother   . Diabetes Mother   . Hypertension Mother   . Stroke Mother   . Stroke Paternal Grandmother   . Prostate cancer Neg Hx   . Bladder Cancer Neg Hx     Social History Social History   Tobacco Use  . Smoking status: Never  . Smokeless tobacco: Never  Vaping Use  . Vaping status: Never Used  Substance Use Topics  . Alcohol use: No  . Drug use: No    Current Outpatient Medications  Medication Sig Dispense Refill  . amLODipine (NORVASC) 5 MG tablet Take 1 tablet by mouth 2 (two) times daily.    Marland Kitchen aspirin EC 81 MG tablet Take 1 tablet (81 mg total) by mouth daily at 12 noon. 30 tablet 2  . azelastine (ASTELIN) 0.1 % nasal spray Place 2 sprays into the nose daily as needed for rhinitis. PRN    . b complex vitamins capsule Take 1 capsule by mouth daily as needed (energy).    . metoprolol tartrate (LOPRESSOR) 25 MG tablet Take 0.5 tablets (12.5 mg total) by mouth 2 (two) times daily. 30 tablet 11  . Omega-3 Fatty Acids (OMEGA 3 PO) Take 1 capsule by mouth daily.    Marland Kitchen PREVIDENT 5000 SENSITIVE 1.1-5 % GEL Place 1 Application onto teeth daily.    . traMADol (ULTRAM) 50 MG tablet Take  50 mg by mouth every 6 (six) hours as needed for moderate pain (pain score 4-6).     No current facility-administered medications for this visit.    Allergies  Allergen Reactions  . Metformin Other (See Comments)    Stomach problems  . Rosuvastatin Other (See Comments)    fatigue    Review of Systems  BP (!) 163/89 (BP Location: Left Arm)   Pulse 62   Resp 18   Ht 5\' 8"  (1.727 m)   Wt 161 lb (73 kg)   SpO2 97%   BMI 24.48 kg/m  Physical Exam   Diagnostic Tests:   Impression:   Plan:   Marcus Borne, MD [facility] [phone]

## 2023-12-07 NOTE — Pre-Procedure Instructions (Signed)
 Surgical Instructions   Your procedure is scheduled on December 10, 2023. Report to [facility] Main Entrance "A" at 5:30 A.M., then check in with the Admitting office. Any questions or running late day of surgery: call [phone]  Questions prior to your surgery date: call [phone], Monday-Friday, 8am-4pm. If you experience any cold or flu symptoms such as cough, fever, chills, shortness of breath, etc. between now and your scheduled surgery, please notify us at the above number.     Remember:  Do not eat or drink after midnight the night before your surgery    Take these medicines the morning of surgery with A SIP OF WATER: amLODipine (NORVASC)  metoprolol tartrate (LOPRESSOR)    May take these medicines IF NEEDED: diazepam (VALIUM)  traMADol (ULTRAM)    Continue taking your Aspirin through the day before surgery. DO NOT take any the morning of surgery.   One week prior to surgery, STOP taking any Aleve, Naproxen, Ibuprofen, Motrin, Advil, Goody's, BC's, all herbal medications, fish oil, and non-prescription vitamins.                     Do NOT Smoke (Tobacco/Vaping) for 24 hours prior to your procedure.  If you use a CPAP at night, you may bring your mask/headgear for your overnight stay.   You will be asked to remove any contacts, glasses, piercing's, hearing aid's, dentures/partials prior to surgery. Please bring cases for these items if needed.    Patients discharged the day of surgery will not be allowed to drive home, and someone needs to stay with them for 24 hours.  SURGICAL WAITING ROOM VISITATION Patients may have no more than 2 support people in the waiting area - these visitors may rotate.   Pre-op nurse will coordinate an appropriate time for 1 ADULT support person, who may not rotate, to accompany patient in pre-op.  Children under the age of 53 must have an adult with them who is not the patient and must remain in the main waiting area with an adult.  If  the patient needs to stay at the hospital during part of their recovery, the visitor guidelines for inpatient rooms apply.  Please refer to the [facility] website for the visitor guidelines for any additional information.   If you received a COVID test during your pre-op visit  it is requested that you wear a mask when out in public, stay away from anyone that may not be feeling well and notify your surgeon if you develop symptoms. If you have been in contact with anyone that has tested positive in the last 10 days please notify you surgeon.      Pre-operative CHG Bathing Instructions   You can play a key role in reducing the risk of infection after surgery. Your skin needs to be as free of germs as possible. You can reduce the number of germs on your skin by washing with CHG (chlorhexidine gluconate) soap before surgery. CHG is an antiseptic soap that kills germs and continues to kill germs even after washing.   DO NOT use if you have an allergy to chlorhexidine/CHG or antibacterial soaps. If your skin becomes reddened or irritated, stop using the CHG and notify one of our RNs at [phone].              TAKE A SHOWER THE NIGHT BEFORE SURGERY AND THE DAY OF SURGERY    Please keep in mind the following:  DO NOT shave, including  legs and underarms, 48 hours prior to surgery.   You may shave your face before/day of surgery.  Place clean sheets on your bed the night before surgery Use a clean washcloth (not used since being washed) for each shower. DO NOT sleep with pet's night before surgery.  CHG Shower Instructions:  Wash your face and private area with normal soap. If you choose to wash your hair, wash first with your normal shampoo.  After you use shampoo/soap, rinse your hair and body thoroughly to remove shampoo/soap residue.  Turn the water OFF and apply half the bottle of CHG soap to a CLEAN washcloth.  Apply CHG soap ONLY FROM YOUR NECK DOWN TO YOUR TOES (washing for 3-5  minutes)  DO NOT use CHG soap on face, private areas, open wounds, or sores.  Pay special attention to the area where your surgery is being performed.  If you are having back surgery, having someone wash your back for you may be helpful. Wait 2 minutes after CHG soap is applied, then you may rinse off the CHG soap.  Pat dry with a clean towel  Put on clean pajamas    Additional instructions for the day of surgery: DO NOT APPLY any lotions, deodorants, cologne, or perfumes.   Do not wear jewelry or makeup Do not wear nail polish, gel polish, artificial nails, or any other type of covering on natural nails (fingers and toes) Do not bring valuables to the hospital. [facility] is not responsible for valuables/personal belongings. Put on clean/comfortable clothes.  Please brush your teeth.  Ask your nurse before applying any prescription medications to the skin.

## 2023-12-08 ENCOUNTER — Encounter (HOSPITAL_COMMUNITY)
Admission: RE | Admit: 2023-12-08 | Discharge: 2023-12-08 | Disposition: A | Discharge status: Home / Self Care | Source: Ambulatory Visit | Attending: Surgery | Admitting: Surgery

## 2023-12-08 ENCOUNTER — Encounter (HOSPITAL_COMMUNITY): Payer: Self-pay

## 2023-12-08 ENCOUNTER — Ambulatory Visit (HOSPITAL_COMMUNITY)
Admission: RE | Admit: 2023-12-08 | Discharge: 2023-12-08 | Disposition: A | Discharge status: Home / Self Care | Source: Ambulatory Visit | Attending: Surgery | Admitting: Surgery

## 2023-12-08 ENCOUNTER — Other Ambulatory Visit: Payer: Self-pay

## 2023-12-08 VITALS — BP 125/80 | HR 65 | Temp 98.0°F | Resp 17 | Ht 68.0 in | Wt 166.4 lb

## 2023-12-08 DIAGNOSIS — Z955 Presence of coronary angioplasty implant and graft: Secondary | ICD-10-CM | POA: Insufficient documentation

## 2023-12-08 DIAGNOSIS — G4733 Obstructive sleep apnea (adult) (pediatric): Secondary | ICD-10-CM | POA: Insufficient documentation

## 2023-12-08 DIAGNOSIS — E785 Hyperlipidemia, unspecified: Secondary | ICD-10-CM | POA: Diagnosis not present

## 2023-12-08 DIAGNOSIS — I25110 Atherosclerotic heart disease of native coronary artery with unstable angina pectoris: Secondary | ICD-10-CM | POA: Diagnosis not present

## 2023-12-08 DIAGNOSIS — D62 Acute posthemorrhagic anemia: Secondary | ICD-10-CM | POA: Diagnosis not present

## 2023-12-08 DIAGNOSIS — I10 Essential (primary) hypertension: Secondary | ICD-10-CM | POA: Insufficient documentation

## 2023-12-08 DIAGNOSIS — I25119 Atherosclerotic heart disease of native coronary artery with unspecified angina pectoris: Secondary | ICD-10-CM | POA: Insufficient documentation

## 2023-12-08 DIAGNOSIS — D72828 Other elevated white blood cell count: Secondary | ICD-10-CM | POA: Diagnosis not present

## 2023-12-08 DIAGNOSIS — Z9889 Other specified postprocedural states: Secondary | ICD-10-CM | POA: Insufficient documentation

## 2023-12-08 DIAGNOSIS — E119 Type 2 diabetes mellitus without complications: Secondary | ICD-10-CM | POA: Insufficient documentation

## 2023-12-08 DIAGNOSIS — I6522 Occlusion and stenosis of left carotid artery: Secondary | ICD-10-CM | POA: Diagnosis not present

## 2023-12-08 DIAGNOSIS — Z01818 Encounter for other preprocedural examination: Secondary | ICD-10-CM | POA: Insufficient documentation

## 2023-12-08 DIAGNOSIS — D6959 Other secondary thrombocytopenia: Secondary | ICD-10-CM | POA: Diagnosis not present

## 2023-12-08 DIAGNOSIS — E876 Hypokalemia: Secondary | ICD-10-CM | POA: Diagnosis not present

## 2023-12-08 DIAGNOSIS — T82855A Stenosis of coronary artery stent, initial encounter: Secondary | ICD-10-CM | POA: Diagnosis not present

## 2023-12-08 DIAGNOSIS — E1165 Type 2 diabetes mellitus with hyperglycemia: Secondary | ICD-10-CM | POA: Diagnosis not present

## 2023-12-08 DIAGNOSIS — I4891 Unspecified atrial fibrillation: Secondary | ICD-10-CM | POA: Diagnosis not present

## 2023-12-08 DIAGNOSIS — I97190 Other postprocedural cardiac functional disturbances following cardiac surgery: Secondary | ICD-10-CM | POA: Diagnosis not present

## 2023-12-08 LAB — CBC
HCT: 39.8 % (ref 39.0–52.0)
Hemoglobin: 13.8 g/dL (ref 13.0–17.0)
MCH: 31.1 pg (ref 26.0–34.0)
MCHC: 34.7 g/dL (ref 30.0–36.0)
MCV: 89.6 fL (ref 80.0–100.0)
Platelets: 181 10*3/uL (ref 150–400)
RBC: 4.44 MIL/uL (ref 4.22–5.81)
RDW: 13.6 % (ref 11.5–15.5)
WBC: 5.3 10*3/uL (ref 4.0–10.5)
nRBC: 0 % (ref 0.0–0.2)

## 2023-12-08 LAB — URINALYSIS, ROUTINE W REFLEX MICROSCOPIC
Bilirubin Urine: NEGATIVE
Glucose, UA: NEGATIVE mg/dL
Hgb urine dipstick: NEGATIVE
Ketones, ur: 5 mg/dL — AB
Leukocytes,Ua: NEGATIVE
Nitrite: NEGATIVE
Protein, ur: NEGATIVE mg/dL
Specific Gravity, Urine: 1.019 (ref 1.005–1.030)
pH: 6 (ref 5.0–8.0)

## 2023-12-08 LAB — COMPREHENSIVE METABOLIC PANEL
ALT: 17 U/L (ref 0–44)
AST: 19 U/L (ref 15–41)
Albumin: 4.2 g/dL (ref 3.5–5.0)
Alkaline Phosphatase: 68 U/L (ref 38–126)
Anion gap: 5 (ref 5–15)
BUN: 21 mg/dL (ref 8–23)
CO2: 31 mmol/L (ref 22–32)
Calcium: 9.4 mg/dL (ref 8.9–10.3)
Chloride: 106 mmol/L (ref 98–111)
Creatinine, Ser: 1.23 mg/dL (ref 0.61–1.24)
GFR, Estimated: >60 mL/min (ref >60)
Glucose, Bld: 106 mg/dL — ABNORMAL HIGH (ref 70–99)
Potassium: 4.2 mmol/L (ref 3.5–5.1)
Sodium: 142 mmol/L (ref 135–145)
Total Bilirubin: 0.8 mg/dL (ref 0.0–1.2)
Total Protein: 6.9 g/dL (ref 6.5–8.1)

## 2023-12-08 LAB — SURGICAL PCR SCREEN
MRSA, PCR: NEGATIVE
Staphylococcus aureus: POSITIVE — AB

## 2023-12-08 LAB — APTT: aPTT: 29 s (ref 24–36)

## 2023-12-08 LAB — TYPE AND SCREEN
ABO/RH(D): O POS
Antibody Screen: NEGATIVE

## 2023-12-08 LAB — PROTIME-INR
INR: 1 (ref 0.8–1.2)
Prothrombin Time: 13.6 s (ref 11.4–15.2)

## 2023-12-08 LAB — GLUCOSE, CAPILLARY: Glucose-Capillary: 94 mg/dL (ref 70–99)

## 2023-12-08 LAB — HEMOGLOBIN A1C
Hgb A1c MFr Bld: 5.6 % (ref 4.8–5.6)
Mean Plasma Glucose: 114.02 mg/dL

## 2023-12-08 NOTE — Progress Notes (Signed)
 PCP - Dr. Maudie Flakes Cardiologist - Dr. Lavone Neri - Last office visit 11/17/2023  PPM/ICD - Denies Device Orders - n/a Rep Notified - n/a  Chest x-ray - 12/08/2023 EKG - 12/08/2023 Stress Test - 10/27/2023 (CE) ECHO - 10/27/2023 (CE) Cardiac Cath - 11/11/2023  Sleep Study - very mild OSA per pt. He used to wear his CPAP, but it currently stresses his heart out wearing it so he does not.  Pt is DM2. He has a CGM, but it is not on today. He has a regular meter at home as well and checks blood sugars 2-3x/day. Normal fasting is 85-95. CBG at pre-op appointment 84. A1c result pending.  Last dose of GLP1 agonist-  n/a GLP1 instructions: n/a  Blood Thinner Instructions: n/a Aspirin Instructions: Pt instructed to continue taking ASA through day before surgery and NONE morning of surgery  NPO after midnight  COVID TEST- n/a   Anesthesia review: Yes. Hx of CAD with 3 stents placed (2 of which have restenosis), HTN, OSA and DM2.  Patient denies shortness of breath, fever, cough and chest pain at PAT appointment. Pt denies any respiratory illness/infection in the last two months.    All instructions explained to the patient, with a verbal understanding of the material. Patient agrees to go over the instructions while at home for a better understanding. Patient also instructed to self quarantine after being tested for COVID-19. The opportunity to ask questions was provided.

## 2023-12-09 MED ORDER — EPINEPHRINE HCL 5 MG/250ML IV SOLN IN NS
0.0000 ug/min | INTRAVENOUS | Status: DC
  Filled 2023-12-09: qty 250

## 2023-12-09 MED ORDER — MAGNESIUM SULFATE 50 % IJ SOLN
40.0000 meq | INTRAMUSCULAR | Status: DC
Start: 2023-12-10 — End: 2023-12-11
  Filled 2023-12-09: qty 9.85

## 2023-12-09 MED ORDER — TRANEXAMIC ACID 1000 MG/10ML IV SOLN
1.5000 mg/kg/h | INTRAVENOUS | Status: AC
  Administered 2023-12-10: 1.5 mg/kg/h via INTRAVENOUS
  Filled 2023-12-09: qty 25

## 2023-12-09 MED ORDER — POTASSIUM CHLORIDE 2 MEQ/ML IV SOLN
80.0000 meq | INTRAVENOUS | Status: DC
  Filled 2023-12-09: qty 40

## 2023-12-09 MED ORDER — HEPARIN SODIUM (PORCINE) 1000 UNIT/ML IJ SOLN
INTRAMUSCULAR | Status: DC
  Filled 2023-12-09: qty 2.5

## 2023-12-09 MED ORDER — PHENYLEPHRINE HCL-NACL 20-0.9 MG/250ML-% IV SOLN
30.0000 ug/min | INTRAVENOUS | Status: AC
  Administered 2023-12-10: 15 ug/min via INTRAVENOUS
  Filled 2023-12-09: qty 250

## 2023-12-09 MED ORDER — CEFAZOLIN SODIUM-DEXTROSE 2-4 GM/100ML-% IV SOLN
2.0000 g | INTRAVENOUS | Status: AC
Start: 2023-12-10 — End: 2023-12-11
  Administered 2023-12-10 (×2): 2 g via INTRAVENOUS
  Filled 2023-12-09: qty 100

## 2023-12-09 MED ORDER — TRANEXAMIC ACID (OHS) BOLUS VIA INFUSION
15.0000 mg/kg | INTRAVENOUS | Status: AC
  Administered 2023-12-10: 1132.5 mg via INTRAVENOUS
  Filled 2023-12-09: qty 1133

## 2023-12-09 MED ORDER — MILRINONE LACTATE IN DEXTROSE 20-5 MG/100ML-% IV SOLN
0.3000 ug/kg/min | INTRAVENOUS | Status: DC
  Filled 2023-12-09: qty 100

## 2023-12-09 MED ORDER — INSULIN REGULAR(HUMAN) IN NACL 100-0.9 UT/100ML-% IV SOLN
INTRAVENOUS | Status: AC
  Administered 2023-12-10: 1.8 [IU]/h via INTRAVENOUS
  Filled 2023-12-09: qty 100

## 2023-12-09 MED ORDER — NITROGLYCERIN IN D5W 200-5 MCG/ML-% IV SOLN
2.0000 ug/min | INTRAVENOUS | Status: DC
  Filled 2023-12-09: qty 250

## 2023-12-09 MED ORDER — HEPARIN 30,000 UNITS/1000 ML (OHS) CELLSAVER SOLUTION
Status: DC
  Filled 2023-12-09: qty 1000

## 2023-12-09 MED ORDER — VANCOMYCIN HCL 1250 MG/250ML IV SOLN
1250.0000 mg | INTRAVENOUS | Status: AC
  Administered 2023-12-10: 1250 mg via INTRAVENOUS
  Filled 2023-12-09: qty 250

## 2023-12-09 MED ORDER — NOREPINEPHRINE 4 MG/250ML-% IV SOLN
0.0000 ug/min | INTRAVENOUS | Status: DC
  Filled 2023-12-09: qty 250

## 2023-12-09 MED ORDER — DEXMEDETOMIDINE HCL IN NACL 400 MCG/100ML IV SOLN
0.1000 ug/kg/h | INTRAVENOUS | Status: AC
Start: 2023-12-10 — End: 2023-12-11
  Administered 2023-12-10: .7 ug/kg/h via INTRAVENOUS
  Filled 2023-12-09: qty 100

## 2023-12-09 MED ORDER — CEFAZOLIN SODIUM-DEXTROSE 2-4 GM/100ML-% IV SOLN
2.0000 g | INTRAVENOUS | Status: DC
  Filled 2023-12-09: qty 100

## 2023-12-09 MED ORDER — TRANEXAMIC ACID (OHS) PUMP PRIME SOLUTION
2.0000 mg/kg | INTRAVENOUS | Status: DC
  Filled 2023-12-09: qty 1.51

## 2023-12-09 NOTE — H&P (Signed)
 [address]       [place] [address]             [phone]      PCP is Feldpausch, Madaline Guthrie, MD Referring Provider is Marcina Millard, MD      Chief Complaint  Patient presents with   Coronary Artery Disease      HPI:   The patient is a 73 year old gentleman with a history of hypertension, hyperlipidemia, OSA on CPAP, moderate left carotid artery stenosis, type 2 diabetes, and coronary artery disease status post overlapping DES to the distal RCA on 08/05/2022 and staged PCI with DES to the distal LAD on 08/28/2022.  He has been followed by Dr. Darrold Junker.  He recently presented with episodes of his heart pounding at night and had a 72-hour Holter monitor showing occasional PACs and PVCs.  A 2D echocardiogram on 10/27/2023 showed an ejection fraction of greater than 55%.  There is no significant valvular abnormality.  A Lexiscan Myoview on 10/27/2023 showed apical, inferior, and septal ischemia.  Cardiac catheterization on 11/11/2023 showed severe three-vessel coronary disease with diffuse in-stent restenosis in the proximal and distal RCA, diffuse in-stent restenosis in the distal LAD at the apex, 85% proximal and 75% mid LAD stenoses, and a 75% stenosis and a large first marginal branch.  Left ventricular ejection fraction was 55 to 65% by visual estimate with a mildly elevated LVEDP.   He is married and lives with his wife.  He has noted worsening exertional fatigue as well as some chest discomfort with moderate exertion.  He has noticed some shortness of breath with lying flat.     Past Medical History:  Diagnosis Date   Carotid artery thrombosis      Left Carotid Artery   Coronary artery disease     Diabetes (HCC)     Diverticulosis     GERD (gastroesophageal reflux disease)     History of kidney stones     Hyperlipidemia     Hypertension     Renal mass     Sleep apnea      C-PAP               Past Surgical History:  Procedure Laterality Date    COLONOSCOPY WITH PROPOFOL N/A 08/15/2019    Procedure: COLONOSCOPY WITH PROPOFOL;  Surgeon: Midge Minium, MD;  Location: [facility];  Service: Endoscopy;  Laterality: N/A;  sleep apnea   CORONARY STENT INTERVENTION N/A 08/05/2022    Procedure: CORONARY STENT INTERVENTION;  Surgeon: Marcina Millard, MD;  Location: [facility];  Service: Cardiovascular;  Laterality: N/A;   CORONARY STENT INTERVENTION Left 08/28/2022    Procedure: CORONARY STENT INTERVENTION;  Surgeon: Marcina Millard, MD;  Location: [facility];  Service: Cardiovascular;  Laterality: Left;   EYE SURGERY Bilateral      LASIK EYE SURGERY   INGUINAL HERNIA REPAIR Bilateral 05/12/2017    Procedure: LAPAROSCOPIC BILATERAL INGUINAL HERNIA REPAIR, umbilical hernia repair;  Surgeon: Lattie Haw, MD;  Location: [facility];  Service: General;  Laterality: Bilateral;   LEFT HEART CATH AND CORONARY ANGIOGRAPHY N/A 08/05/2022    Procedure: LEFT HEART CATH AND CORONARY ANGIOGRAPHY;  Surgeon: Marcina Millard, MD;  Location: [facility];  Service: Cardiovascular;  Laterality: N/A;   LEFT HEART CATH AND CORONARY ANGIOGRAPHY Left 11/11/2023    Procedure: LEFT HEART CATH AND CORONARY ANGIOGRAPHY;  Surgeon: Marcina Millard, MD;  Location: [facility];  Service: Cardiovascular;  Laterality: Left;   none       TONSILLECTOMY                   Family History  Problem Relation Age of Onset   Hypertension Father     Stroke Father     Coronary artery disease Maternal Grandmother     Stroke Maternal Grandmother     Diabetes Mother     Hypertension Mother     Stroke Mother     Stroke Paternal Grandmother     Prostate cancer Neg Hx     Bladder Cancer Neg Hx            Social History Social History  Social History        Tobacco Use   Smoking status: Never   Smokeless tobacco: Never  Vaping Use   Vaping status: Never Used  Substance Use Topics   Alcohol use: No   Drug  use: No              Current Outpatient Medications  Medication Sig Dispense Refill   amLODipine (NORVASC) 5 MG tablet Take 1 tablet by mouth 2 (two) times daily.       aspirin EC 81 MG tablet Take 1 tablet (81 mg total) by mouth daily at 12 noon. 30 tablet 2   azelastine (ASTELIN) 0.1 % nasal spray Place 2 sprays into the nose daily as needed for rhinitis. PRN       b complex vitamins capsule Take 1 capsule by mouth daily as needed (energy).       metoprolol tartrate (LOPRESSOR) 25 MG tablet Take 0.5 tablets (12.5 mg total) by mouth 2 (two) times daily. 30 tablet 11   Omega-3 Fatty Acids (OMEGA 3 PO) Take 1 capsule by mouth daily.       PREVIDENT 5000 SENSITIVE 1.1-5 % GEL Place 1 Application onto teeth daily.       traMADol (ULTRAM) 50 MG tablet Take 50 mg by mouth every 6 (six) hours as needed for moderate pain (pain score 4-6).          No current facility-administered medications for this visit.        Allergies       Allergies  Allergen Reactions   Metformin Other (See Comments)      Stomach problems   Rosuvastatin Other (See Comments)      fatigue        Review of Systems  Constitutional:  Positive for activity change and fatigue.  HENT: Negative.    Eyes: Negative.   Respiratory:  Positive for shortness of breath.   Cardiovascular:  Positive for chest pain. Negative for leg swelling.  Gastrointestinal: Negative.   Endocrine: Negative.   Genitourinary: Negative.   Musculoskeletal: Negative.   Skin: Negative.   Allergic/Immunologic: Negative.   Neurological:  Negative for dizziness and syncope.  Hematological: Negative.   Psychiatric/Behavioral: Negative.        BP (!) 163/89 (BP Location: Left Arm)   Pulse 62   Resp 18   Ht 5\' 8"  (1.727 m)   Wt 161 lb (73 kg)   SpO2 97%   BMI 24.48 kg/m  Physical Exam Constitutional:      Appearance: Normal appearance. He is normal weight.  HENT:     Head: Normocephalic and atraumatic.  Eyes:     Extraocular  Movements: Extraocular movements intact.     Pupils: Pupils are equal, round, and reactive to light.  Cardiovascular:     Rate and Rhythm: Normal rate and regular rhythm.     Pulses: Normal pulses.     Heart sounds: Normal heart sounds. No murmur heard. Pulmonary:     Effort: Pulmonary effort is normal.     Breath sounds: Normal breath sounds.  Abdominal:     General: There is no distension.     Tenderness: There is no abdominal tenderness.  Musculoskeletal:        General: No swelling.     Cervical back: Normal range of motion and neck supple.  Skin:    General: Skin is warm and dry.  Neurological:     General: No focal deficit present.     Mental Status: He is alert and oriented to person, place, and time.  Psychiatric:        Mood and Affect: Mood normal.        Behavior: Behavior normal.          Diagnostic Tests:   Physicians   Panel Physicians Referring Physician Case Authorizing Physician  Paraschos, Alexander, MD (Primary)        Procedures   LEFT HEART CATH AND CORONARY ANGIOGRAPHY    Conclusion       Prox RCA lesion is 95% stenosed.   Mid RCA lesion is 90% stenosed.   Dist RCA lesion is 99% stenosed.   2nd Mrg lesion is 75% stenosed.   Prox Cx lesion is 70% stenosed.   Prox LAD to Mid LAD lesion is 85% stenosed.   Mid LAD lesion is 75% stenosed.   Dist LAD lesion is 95% stenosed.   The left ventricular systolic function is normal.   LV end diastolic pressure is mildly elevated.   The left ventricular ejection fraction is 55-65% by visual estimate.   1.  Three-vessel coronary artery disease with diffuse in-stent restenosis proximal and distal RCA, diffuse in-stent restenosis distal LAD, 85% stenosis proximal and 75% stenosis mid LAD, 75% stenosis OM1 branch 2.  Normal left ventricular function   Recommendations   Complex PCI versus CABG   Indications   Abnormal cardiac function test [R94.30 (ICD-10-CM)]    Procedural Details   Technical  Details The right wrist was prepped and draped in the usual sterile manner.  Anesthesia was obtained 1% lidocaine locally.  A hydrophilic 6 French sheath was introduced to right radial artery.  Coronary angiography was performed with JR4 and JL 4 diagnostic catheters.  Left ventriculography was performed the JR4 diagnostic catheter.  Hemostasis was achieved with a TR band with 9 cc of air.  Patient received 1 mg of Versed and 25 mcg of fentanyl.  He received 3500 units of heparin.  Total contrast was 95 cc.  There were no periprocedural complications. Estimated blood loss <50 mL.   During this procedure medications were administered to achieve and maintain moderate conscious sedation while the patient's heart rate, blood pressure, and oxygen saturation were continuously monitored and I was present face-to-face 100% of this time. April Traczyk RN and Loetta Rough Cardiovascular Specialist are independent, trained observers who assisted in the monitoring of the patient's level of consciousness.    Medications (Filter: Administrations occurring from 0730 to 0837 on 11/11/23)  important  Continuous medications are totaled by the amount administered until 11/11/23 0837.    midazolam (VERSED) injection (mg)  Total dose: 1 mg Date/Time Rate/Dose/Volume Action    11/11/23 0750 1 mg Given    fentaNYL (SUBLIMAZE) injection (mcg)  Total dose: 25 mcg  Date/Time Rate/Dose/Volume Action    11/11/23 0750 25 mcg Given    lidocaine (PF) (XYLOCAINE) 1 % injection (mL)  Total volume: 2 mL Date/Time Rate/Dose/Volume Action    11/11/23 0756 2 mL Given    heparin sodium (porcine) injection (Units)  Total dose: 3,500 Units Date/Time Rate/Dose/Volume Action    11/11/23 0802 3,500 Units Given    verapamil (ISOPTIN) injection (mg)  Total dose: 2.5 mg Date/Time Rate/Dose/Volume Action    11/11/23 0758 2.5 mg Given    metoprolol tartrate (LOPRESSOR) injection (mg)  Total dose: 5 mg Date/Time Rate/Dose/Volume  Action    11/11/23 0828 5 mg Given    iohexol (OMNIPAQUE) 300 MG/ML solution (mL)  Total volume: 95 mL Date/Time Rate/Dose/Volume Action    11/11/23 0829 95 mL Given    Heparin (Porcine) in NaCl 1000-0.9 UT/500ML-% SOLN (mL)  Total volume: 1,000 mL Date/Time Rate/Dose/Volume Action    11/11/23 0831 500 mL Given    0831 500 mL Given      Sedation Time   Sedation Time Physician-1: 34 minutes 29 seconds Contrast        Administrations occurring from 0730 to 0837 on 11/11/23:  Medication Name Total Dose  iohexol (OMNIPAQUE) 300 MG/ML solution 95 mL    Radiation/Fluoro   Fluoro time: 8.3 (min) DAP: 31.2 (Gycm2) Cumulative Air Kerma: 436 (mGy) Complications   Complications documented before study signed (11/11/2023  8:55 AM)    No complications were associated with this study.  Documented by Loetta Rough, RT - 11/11/2023  8:26 AM      Coronary Findings   Diagnostic Dominance: Co-dominant Left Anterior Descending  Prox LAD to Mid LAD lesion is 85% stenosed. The lesion is tubular.  Mid LAD lesion is 75% stenosed. The lesion is tubular.  Dist LAD lesion is 95% stenosed. The lesion was previously treated .    Left Circumflex  Prox Cx lesion is 70% stenosed.    Second Obtuse Marginal Branch  2nd Mrg lesion is 75% stenosed. The lesion is segmental.    Right Coronary Artery  Prox RCA lesion is 95% stenosed. The lesion is segmental and irregular. The lesion was previously treated .  Mid RCA lesion is 90% stenosed. The lesion is tubular.  Dist RCA lesion is 99% stenosed.    Intervention    No interventions have been documented.    Wall Motion       The following segments are normal: mid anterior, mid inferior, basilar anterior, basilar inferior, apical anterior and apical inferior.             Left Heart   Left Ventricle The left ventricular size is normal. The left ventricular systolic function is normal. LV end diastolic pressure is mildly elevated. The left  ventricular ejection fraction is 55-65% by visual estimate. No regional wall motion abnormalities.    Coronary Diagrams   Diagnostic Dominance: Co-dominant  Intervention    Implants    No implant documentation for this case.    Syngo Images    Show images for CARDIAC CATHETERIZATION Images on Long Term Storage    Show images for Patton, Rabinovich to Procedure Log   Procedure Log    Hemo Data   Flowsheet Row Most Recent Value  AO Systolic Pressure 178 mmHg  AO Diastolic Pressure 84 mmHg  AO Mean 123 mmHg  LV Systolic Pressure 185 mmHg  LV Diastolic Pressure 5 mmHg  LV EDP 37 mmHg  Arterial Occlusion Pressure Extended Systolic Pressure 183 mmHg  Arterial Occlusion Pressure Extended Diastolic Pressure 88 mmHg  Arterial Occlusion Pressure Extended Mean Pressure 132 mmHg  Left Ventricular Apex Extended Systolic Pressure 181 mmHg  LVp Diastolic Pressure 14 mmHg  Left Ventricular Apex Extended EDP Pressure 29 mmHg        Impression:   This 53 gentleman has severe three-vessel coronary disease with high-grade in-stent restenosis in the LAD and RCA territory.  He has a high risk nuclear stress test and symptoms suggesting ischemia.  I agree that coronary artery bypass graft surgery is the best treatment to prevent further ischemia and infarction and improve his quality of life. I discussed the operative procedure with the patient and his wife including alternatives, benefits and risks; including but not limited to bleeding, blood transfusion, infection, stroke, myocardial infarction, graft failure, heart block requiring a permanent pacemaker, organ dysfunction, and death.  Viona Gilmore understands and agrees to proceed.       Plan:   Coronary bypass graft surgery on 12/10/2023.       [name], MD [facility] [phone]

## 2023-12-10 ENCOUNTER — Inpatient Hospital Stay (HOSPITAL_COMMUNITY)

## 2023-12-10 ENCOUNTER — Other Ambulatory Visit: Payer: Self-pay

## 2023-12-10 ENCOUNTER — Inpatient Hospital Stay (HOSPITAL_COMMUNITY): Payer: Self-pay | Admitting: Physician Assistant

## 2023-12-10 ENCOUNTER — Encounter (HOSPITAL_COMMUNITY): Payer: Self-pay | Admitting: Surgery

## 2023-12-10 ENCOUNTER — Inpatient Hospital Stay (HOSPITAL_COMMUNITY)
Admission: RE | Admit: 2023-12-10 | Discharge: 2023-12-16 | DRG: 236 | Disposition: A | Discharge status: Home / Self Care | Attending: Surgery | Admitting: Surgery

## 2023-12-10 ENCOUNTER — Encounter (HOSPITAL_COMMUNITY)
Admission: RE | Disposition: A | Discharge status: Home / Self Care | Payer: Self-pay | Source: Home / Self Care | Attending: Surgery

## 2023-12-10 ENCOUNTER — Inpatient Hospital Stay (HOSPITAL_COMMUNITY): Payer: Self-pay | Admitting: Certified Registered Nurse Anesthetist

## 2023-12-10 DIAGNOSIS — E119 Type 2 diabetes mellitus without complications: Secondary | ICD-10-CM | POA: Diagnosis not present

## 2023-12-10 DIAGNOSIS — I7300 Raynaud's syndrome without gangrene: Secondary | ICD-10-CM | POA: Diagnosis not present

## 2023-12-10 DIAGNOSIS — Y832 Surgical operation with anastomosis, bypass or graft as the cause of abnormal reaction of the patient, or of later complication, without mention of misadventure at the time of the procedure: Secondary | ICD-10-CM | POA: Diagnosis not present

## 2023-12-10 DIAGNOSIS — Z833 Family history of diabetes mellitus: Secondary | ICD-10-CM

## 2023-12-10 DIAGNOSIS — D72828 Other elevated white blood cell count: Secondary | ICD-10-CM | POA: Diagnosis not present

## 2023-12-10 DIAGNOSIS — Z87442 Personal history of urinary calculi: Secondary | ICD-10-CM | POA: Diagnosis not present

## 2023-12-10 DIAGNOSIS — I97190 Other postprocedural cardiac functional disturbances following cardiac surgery: Secondary | ICD-10-CM | POA: Diagnosis not present

## 2023-12-10 DIAGNOSIS — Z4682 Encounter for fitting and adjustment of non-vascular catheter: Secondary | ICD-10-CM | POA: Diagnosis not present

## 2023-12-10 DIAGNOSIS — I25110 Atherosclerotic heart disease of native coronary artery with unstable angina pectoris: Secondary | ICD-10-CM | POA: Diagnosis not present

## 2023-12-10 DIAGNOSIS — I2510 Atherosclerotic heart disease of native coronary artery without angina pectoris: Secondary | ICD-10-CM | POA: Diagnosis not present

## 2023-12-10 DIAGNOSIS — I25119 Atherosclerotic heart disease of native coronary artery with unspecified angina pectoris: Secondary | ICD-10-CM

## 2023-12-10 DIAGNOSIS — D62 Acute posthemorrhagic anemia: Secondary | ICD-10-CM | POA: Diagnosis not present

## 2023-12-10 DIAGNOSIS — Z79899 Other long term (current) drug therapy: Secondary | ICD-10-CM

## 2023-12-10 DIAGNOSIS — Y831 Surgical operation with implant of artificial internal device as the cause of abnormal reaction of the patient, or of later complication, without mention of misadventure at the time of the procedure: Secondary | ICD-10-CM | POA: Diagnosis present

## 2023-12-10 DIAGNOSIS — E785 Hyperlipidemia, unspecified: Secondary | ICD-10-CM | POA: Diagnosis present

## 2023-12-10 DIAGNOSIS — Z8249 Family history of ischemic heart disease and other diseases of the circulatory system: Secondary | ICD-10-CM | POA: Diagnosis not present

## 2023-12-10 DIAGNOSIS — Z951 Presence of aortocoronary bypass graft: Secondary | ICD-10-CM | POA: Diagnosis not present

## 2023-12-10 DIAGNOSIS — I517 Cardiomegaly: Secondary | ICD-10-CM | POA: Diagnosis not present

## 2023-12-10 DIAGNOSIS — Z7982 Long term (current) use of aspirin: Secondary | ICD-10-CM

## 2023-12-10 DIAGNOSIS — Z823 Family history of stroke: Secondary | ICD-10-CM

## 2023-12-10 DIAGNOSIS — T82855A Stenosis of coronary artery stent, initial encounter: Principal | ICD-10-CM | POA: Diagnosis present

## 2023-12-10 DIAGNOSIS — E1165 Type 2 diabetes mellitus with hyperglycemia: Secondary | ICD-10-CM | POA: Diagnosis present

## 2023-12-10 DIAGNOSIS — R9389 Abnormal findings on diagnostic imaging of other specified body structures: Secondary | ICD-10-CM | POA: Diagnosis not present

## 2023-12-10 DIAGNOSIS — I10 Essential (primary) hypertension: Secondary | ICD-10-CM | POA: Diagnosis not present

## 2023-12-10 DIAGNOSIS — R5381 Other malaise: Secondary | ICD-10-CM | POA: Diagnosis not present

## 2023-12-10 DIAGNOSIS — R0689 Other abnormalities of breathing: Secondary | ICD-10-CM | POA: Diagnosis not present

## 2023-12-10 DIAGNOSIS — I493 Ventricular premature depolarization: Secondary | ICD-10-CM | POA: Diagnosis not present

## 2023-12-10 DIAGNOSIS — E876 Hypokalemia: Secondary | ICD-10-CM | POA: Diagnosis not present

## 2023-12-10 DIAGNOSIS — R0902 Hypoxemia: Secondary | ICD-10-CM | POA: Diagnosis not present

## 2023-12-10 DIAGNOSIS — K219 Gastro-esophageal reflux disease without esophagitis: Secondary | ICD-10-CM | POA: Diagnosis not present

## 2023-12-10 DIAGNOSIS — J9811 Atelectasis: Secondary | ICD-10-CM | POA: Diagnosis not present

## 2023-12-10 DIAGNOSIS — Z888 Allergy status to other drugs, medicaments and biological substances status: Secondary | ICD-10-CM

## 2023-12-10 DIAGNOSIS — I6522 Occlusion and stenosis of left carotid artery: Secondary | ICD-10-CM | POA: Diagnosis not present

## 2023-12-10 DIAGNOSIS — R2681 Unsteadiness on feet: Secondary | ICD-10-CM | POA: Diagnosis not present

## 2023-12-10 DIAGNOSIS — R918 Other nonspecific abnormal finding of lung field: Secondary | ICD-10-CM | POA: Diagnosis not present

## 2023-12-10 DIAGNOSIS — D6959 Other secondary thrombocytopenia: Secondary | ICD-10-CM | POA: Diagnosis not present

## 2023-12-10 DIAGNOSIS — I4891 Unspecified atrial fibrillation: Secondary | ICD-10-CM | POA: Diagnosis not present

## 2023-12-10 DIAGNOSIS — G4733 Obstructive sleep apnea (adult) (pediatric): Secondary | ICD-10-CM

## 2023-12-10 DIAGNOSIS — Z48812 Encounter for surgical aftercare following surgery on the circulatory system: Secondary | ICD-10-CM | POA: Diagnosis not present

## 2023-12-10 DIAGNOSIS — K9429 Other complications of gastrostomy: Secondary | ICD-10-CM | POA: Diagnosis not present

## 2023-12-10 DIAGNOSIS — R0989 Other specified symptoms and signs involving the circulatory and respiratory systems: Secondary | ICD-10-CM | POA: Diagnosis not present

## 2023-12-10 HISTORY — PX: INTRAOPERATIVE TRANSESOPHAGEAL ECHOCARDIOGRAM: SHX5062

## 2023-12-10 HISTORY — PX: CORONARY ARTERY BYPASS GRAFT: SHX141

## 2023-12-10 LAB — ECHO INTRAOPERATIVE TEE
AV Mean grad: 3 mmHg
AV Peak grad: 5.3 mmHg
Ao pk vel: 1.15 m/s
Height: 68 in
S' Lateral: 3.1 cm
Weight: 2608 [oz_av]

## 2023-12-10 LAB — POCT I-STAT 7, (LYTES, BLD GAS, ICA,H+H)
Acid-base deficit: 1 mmol/L (ref 0.0–2.0)
Acid-base deficit: 2 mmol/L (ref 0.0–2.0)
Acid-base deficit: 2 mmol/L (ref 0.0–2.0)
Acid-base deficit: 3 mmol/L — ABNORMAL HIGH (ref 0.0–2.0)
Acid-base deficit: 3 mmol/L — ABNORMAL HIGH (ref 0.0–2.0)
Acid-base deficit: 2 mmol/L (ref 0.0–2.0)
Acid-base deficit: 4 mmol/L — ABNORMAL HIGH (ref 0.0–2.0)
Bicarbonate: 23.5 mmol/L (ref 20.0–28.0)
Bicarbonate: 25 mmol/L (ref 20.0–28.0)
Bicarbonate: 22.6 mmol/L (ref 20.0–28.0)
Bicarbonate: 21.6 mmol/L (ref 20.0–28.0)
Bicarbonate: 22.7 mmol/L (ref 20.0–28.0)
Bicarbonate: 23.2 mmol/L (ref 20.0–28.0)
Bicarbonate: 22 mmol/L (ref 20.0–28.0)
Calcium, Ion: 1.17 mmol/L (ref 1.15–1.40)
Calcium, Ion: 0.96 mmol/L — ABNORMAL LOW (ref 1.15–1.40)
Calcium, Ion: 1.06 mmol/L — ABNORMAL LOW (ref 1.15–1.40)
Calcium, Ion: 0.99 mmol/L — ABNORMAL LOW (ref 1.15–1.40)
Calcium, Ion: 1.1 mmol/L — ABNORMAL LOW (ref 1.15–1.40)
Calcium, Ion: 1.09 mmol/L — ABNORMAL LOW (ref 1.15–1.40)
Calcium, Ion: 1.13 mmol/L — ABNORMAL LOW (ref 1.15–1.40)
HCT: 34 % — ABNORMAL LOW (ref 39.0–52.0)
HCT: 26 % — ABNORMAL LOW (ref 39.0–52.0)
HCT: 28 % — ABNORMAL LOW (ref 39.0–52.0)
HCT: 26 % — ABNORMAL LOW (ref 39.0–52.0)
HCT: 29 % — ABNORMAL LOW (ref 39.0–52.0)
HCT: 26 % — ABNORMAL LOW (ref 39.0–52.0)
HCT: 28 % — ABNORMAL LOW (ref 39.0–52.0)
Hemoglobin: 11.6 g/dL — ABNORMAL LOW (ref 13.0–17.0)
Hemoglobin: 8.8 g/dL — ABNORMAL LOW (ref 13.0–17.0)
Hemoglobin: 9.5 g/dL — ABNORMAL LOW (ref 13.0–17.0)
Hemoglobin: 8.8 g/dL — ABNORMAL LOW (ref 13.0–17.0)
Hemoglobin: 9.9 g/dL — ABNORMAL LOW (ref 13.0–17.0)
Hemoglobin: 8.8 g/dL — ABNORMAL LOW (ref 13.0–17.0)
Hemoglobin: 9.5 g/dL — ABNORMAL LOW (ref 13.0–17.0)
O2 Saturation: 100 %
O2 Saturation: 100 %
O2 Saturation: 100 %
O2 Saturation: 100 %
O2 Saturation: 97 %
O2 Saturation: 93 %
O2 Saturation: 93 %
Patient temperature: 35.4
Patient temperature: 37.2
Patient temperature: 37.6
Potassium: 3.7 mmol/L (ref 3.5–5.1)
Potassium: 4.2 mmol/L (ref 3.5–5.1)
Potassium: 4.3 mmol/L (ref 3.5–5.1)
Potassium: 3.8 mmol/L (ref 3.5–5.1)
Potassium: 3.4 mmol/L — ABNORMAL LOW (ref 3.5–5.1)
Potassium: 3.9 mmol/L (ref 3.5–5.1)
Potassium: 4 mmol/L (ref 3.5–5.1)
Sodium: 140 mmol/L (ref 135–145)
Sodium: 138 mmol/L (ref 135–145)
Sodium: 139 mmol/L (ref 135–145)
Sodium: 139 mmol/L (ref 135–145)
Sodium: 140 mmol/L (ref 135–145)
Sodium: 141 mmol/L (ref 135–145)
Sodium: 140 mmol/L (ref 135–145)
TCO2: 25 mmol/L (ref 22–32)
TCO2: 27 mmol/L (ref 22–32)
TCO2: 24 mmol/L (ref 22–32)
TCO2: 23 mmol/L (ref 22–32)
TCO2: 24 mmol/L (ref 22–32)
TCO2: 24 mmol/L (ref 22–32)
TCO2: 23 mmol/L (ref 22–32)
pCO2 arterial: 39 mmHg (ref 32–48)
pCO2 arterial: 51 mmHg — ABNORMAL HIGH (ref 32–48)
pCO2 arterial: 35.2 mmHg (ref 32–48)
pCO2 arterial: 33.4 mmHg (ref 32–48)
pCO2 arterial: 37.1 mmHg (ref 32–48)
pCO2 arterial: 41.6 mmHg (ref 32–48)
pCO2 arterial: 43.3 mmHg (ref 32–48)
pH, Arterial: 7.387 (ref 7.35–7.45)
pH, Arterial: 7.298 — ABNORMAL LOW (ref 7.35–7.45)
pH, Arterial: 7.416 (ref 7.35–7.45)
pH, Arterial: 7.419 (ref 7.35–7.45)
pH, Arterial: 7.386 (ref 7.35–7.45)
pH, Arterial: 7.356 (ref 7.35–7.45)
pH, Arterial: 7.317 — ABNORMAL LOW (ref 7.35–7.45)
pO2, Arterial: 418 mmHg — ABNORMAL HIGH (ref 83–108)
pO2, Arterial: 434 mmHg — ABNORMAL HIGH (ref 83–108)
pO2, Arterial: 274 mmHg — ABNORMAL HIGH (ref 83–108)
pO2, Arterial: 235 mmHg — ABNORMAL HIGH (ref 83–108)
pO2, Arterial: 87 mmHg (ref 83–108)
pO2, Arterial: 70 mmHg — ABNORMAL LOW (ref 83–108)
pO2, Arterial: 74 mmHg — ABNORMAL LOW (ref 83–108)

## 2023-12-10 LAB — POCT I-STAT, CHEM 8
BUN: 22 mg/dL (ref 8–23)
BUN: 21 mg/dL (ref 8–23)
BUN: 19 mg/dL (ref 8–23)
BUN: 18 mg/dL (ref 8–23)
BUN: 17 mg/dL (ref 8–23)
Calcium, Ion: 1.19 mmol/L (ref 1.15–1.40)
Calcium, Ion: 1.2 mmol/L (ref 1.15–1.40)
Calcium, Ion: 1.05 mmol/L — ABNORMAL LOW (ref 1.15–1.40)
Calcium, Ion: 1.02 mmol/L — ABNORMAL LOW (ref 1.15–1.40)
Calcium, Ion: 1 mmol/L — ABNORMAL LOW (ref 1.15–1.40)
Chloride: 104 mmol/L (ref 98–111)
Chloride: 104 mmol/L (ref 98–111)
Chloride: 105 mmol/L (ref 98–111)
Chloride: 103 mmol/L (ref 98–111)
Chloride: 105 mmol/L (ref 98–111)
Creatinine, Ser: 0.8 mg/dL (ref 0.61–1.24)
Creatinine, Ser: 0.7 mg/dL (ref 0.61–1.24)
Creatinine, Ser: 0.7 mg/dL (ref 0.61–1.24)
Creatinine, Ser: 0.6 mg/dL — ABNORMAL LOW (ref 0.61–1.24)
Creatinine, Ser: 0.6 mg/dL — ABNORMAL LOW (ref 0.61–1.24)
Glucose, Bld: 100 mg/dL — ABNORMAL HIGH (ref 70–99)
Glucose, Bld: 104 mg/dL — ABNORMAL HIGH (ref 70–99)
Glucose, Bld: 102 mg/dL — ABNORMAL HIGH (ref 70–99)
Glucose, Bld: 113 mg/dL — ABNORMAL HIGH (ref 70–99)
Glucose, Bld: 111 mg/dL — ABNORMAL HIGH (ref 70–99)
HCT: 36 % — ABNORMAL LOW (ref 39.0–52.0)
HCT: 32 % — ABNORMAL LOW (ref 39.0–52.0)
HCT: 28 % — ABNORMAL LOW (ref 39.0–52.0)
HCT: 26 % — ABNORMAL LOW (ref 39.0–52.0)
HCT: 27 % — ABNORMAL LOW (ref 39.0–52.0)
Hemoglobin: 12.2 g/dL — ABNORMAL LOW (ref 13.0–17.0)
Hemoglobin: 10.9 g/dL — ABNORMAL LOW (ref 13.0–17.0)
Hemoglobin: 9.5 g/dL — ABNORMAL LOW (ref 13.0–17.0)
Hemoglobin: 8.8 g/dL — ABNORMAL LOW (ref 13.0–17.0)
Hemoglobin: 9.2 g/dL — ABNORMAL LOW (ref 13.0–17.0)
Potassium: 3.7 mmol/L (ref 3.5–5.1)
Potassium: 3.6 mmol/L (ref 3.5–5.1)
Potassium: 4.1 mmol/L (ref 3.5–5.1)
Potassium: 4.3 mmol/L (ref 3.5–5.1)
Potassium: 3.8 mmol/L (ref 3.5–5.1)
Sodium: 140 mmol/L (ref 135–145)
Sodium: 140 mmol/L (ref 135–145)
Sodium: 139 mmol/L (ref 135–145)
Sodium: 138 mmol/L (ref 135–145)
Sodium: 138 mmol/L (ref 135–145)
TCO2: 24 mmol/L (ref 22–32)
TCO2: 25 mmol/L (ref 22–32)
TCO2: 25 mmol/L (ref 22–32)
TCO2: 23 mmol/L (ref 22–32)
TCO2: 21 mmol/L — ABNORMAL LOW (ref 22–32)

## 2023-12-10 LAB — CBC
HCT: 28 % — ABNORMAL LOW (ref 39.0–52.0)
HCT: 26.4 % — ABNORMAL LOW (ref 39.0–52.0)
Hemoglobin: 9.7 g/dL — ABNORMAL LOW (ref 13.0–17.0)
Hemoglobin: 9.2 g/dL — ABNORMAL LOW (ref 13.0–17.0)
MCH: 30.4 pg (ref 26.0–34.0)
MCH: 31.1 pg (ref 26.0–34.0)
MCHC: 34.6 g/dL (ref 30.0–36.0)
MCHC: 34.8 g/dL (ref 30.0–36.0)
MCV: 87.8 fL (ref 80.0–100.0)
MCV: 89.2 fL (ref 80.0–100.0)
Platelets: 110 10*3/uL — ABNORMAL LOW (ref 150–400)
Platelets: 120 10*3/uL — ABNORMAL LOW (ref 150–400)
RBC: 3.19 MIL/uL — ABNORMAL LOW (ref 4.22–5.81)
RBC: 2.96 MIL/uL — ABNORMAL LOW (ref 4.22–5.81)
RDW: 13.5 % (ref 11.5–15.5)
RDW: 13.6 % (ref 11.5–15.5)
WBC: 5.7 10*3/uL (ref 4.0–10.5)
WBC: 9.5 10*3/uL (ref 4.0–10.5)
nRBC: 0 % (ref 0.0–0.2)
nRBC: 0 % (ref 0.0–0.2)

## 2023-12-10 LAB — POCT I-STAT EG7
Acid-base deficit: 3 mmol/L — ABNORMAL HIGH (ref 0.0–2.0)
Bicarbonate: 23.1 mmol/L (ref 20.0–28.0)
Calcium, Ion: 0.99 mmol/L — ABNORMAL LOW (ref 1.15–1.40)
HCT: 28 % — ABNORMAL LOW (ref 39.0–52.0)
Hemoglobin: 9.5 g/dL — ABNORMAL LOW (ref 13.0–17.0)
O2 Saturation: 87 %
Potassium: 4.1 mmol/L (ref 3.5–5.1)
Sodium: 138 mmol/L (ref 135–145)
TCO2: 24 mmol/L (ref 22–32)
pCO2, Ven: 45.9 mmHg (ref 44–60)
pH, Ven: 7.31 (ref 7.25–7.43)
pO2, Ven: 58 mmHg — ABNORMAL HIGH (ref 32–45)

## 2023-12-10 LAB — BASIC METABOLIC PANEL WITH GFR
Anion gap: 8 (ref 5–15)
BUN: 14 mg/dL (ref 8–23)
CO2: 21 mmol/L — ABNORMAL LOW (ref 22–32)
Calcium: 7.3 mg/dL — ABNORMAL LOW (ref 8.9–10.3)
Chloride: 108 mmol/L (ref 98–111)
Creatinine, Ser: 0.92 mg/dL (ref 0.61–1.24)
GFR, Estimated: >60 mL/min
Glucose, Bld: 151 mg/dL — ABNORMAL HIGH (ref 70–99)
Potassium: 3.6 mmol/L (ref 3.5–5.1)
Sodium: 137 mmol/L (ref 135–145)

## 2023-12-10 LAB — ABO/RH: ABO/RH(D): O POS

## 2023-12-10 LAB — HEMOGLOBIN AND HEMATOCRIT, BLOOD
HCT: 29.1 % — ABNORMAL LOW (ref 39.0–52.0)
Hemoglobin: 10.2 g/dL — ABNORMAL LOW (ref 13.0–17.0)

## 2023-12-10 LAB — PROTIME-INR
INR: 1.5 — ABNORMAL HIGH (ref 0.8–1.2)
Prothrombin Time: 18 s — ABNORMAL HIGH (ref 11.4–15.2)

## 2023-12-10 LAB — GLUCOSE, CAPILLARY: Glucose-Capillary: 80 mg/dL (ref 70–99)

## 2023-12-10 LAB — MAGNESIUM: Magnesium: 2.9 mg/dL — ABNORMAL HIGH (ref 1.7–2.4)

## 2023-12-10 LAB — APTT: aPTT: 30 s (ref 24–36)

## 2023-12-10 LAB — PLATELET COUNT: Platelets: 130 10*3/uL — ABNORMAL LOW (ref 150–400)

## 2023-12-10 SURGERY — CORONARY ARTERY BYPASS GRAFTING (CABG)
Anesthesia: General | Site: Chest

## 2023-12-10 MED ORDER — PROPOFOL 10 MG/ML IV BOLUS
INTRAVENOUS | Status: AC
  Filled 2023-12-10: qty 20

## 2023-12-10 MED ORDER — CHLORHEXIDINE GLUCONATE 0.12 % MT SOLN
15.0000 mL | Freq: Once | OROMUCOSAL | Status: AC
  Administered 2023-12-10: 15 mL via OROMUCOSAL
  Filled 2023-12-10: qty 15

## 2023-12-10 MED ORDER — FENTANYL CITRATE (PF) 250 MCG/5ML IJ SOLN
INTRAMUSCULAR | Status: DC | PRN
  Administered 2023-12-10: 150 ug via INTRAVENOUS
  Administered 2023-12-10: 100 ug via INTRAVENOUS
  Administered 2023-12-10: 50 ug via INTRAVENOUS
  Administered 2023-12-10 (×2): 100 ug via INTRAVENOUS
  Administered 2023-12-10: 150 ug via INTRAVENOUS
  Administered 2023-12-10: 250 ug via INTRAVENOUS
  Administered 2023-12-10: 100 ug via INTRAVENOUS
  Administered 2023-12-10: 150 ug via INTRAVENOUS
  Administered 2023-12-10: 250 ug via INTRAVENOUS

## 2023-12-10 MED ORDER — CHLORHEXIDINE GLUCONATE CLOTH 2 % EX PADS
6.0000 | MEDICATED_PAD | Freq: Every day | CUTANEOUS | Status: DC
  Administered 2023-12-10 – 2023-12-13 (×4): 6 via TOPICAL

## 2023-12-10 MED ORDER — DEXTROSE 50 % IV SOLN: 0.0000 mL | INTRAVENOUS | Status: DC | PRN

## 2023-12-10 MED ORDER — DOCUSATE SODIUM 100 MG PO CAPS
200.0000 mg | ORAL_CAPSULE | Freq: Every day | ORAL | Status: DC
  Administered 2023-12-11 – 2023-12-12 (×2): 200 mg via ORAL
  Filled 2023-12-10 (×3): qty 2

## 2023-12-10 MED ORDER — METOPROLOL TARTRATE 12.5 MG HALF TABLET
12.5000 mg | ORAL_TABLET | Freq: Two times a day (BID) | ORAL | Status: DC
  Administered 2023-12-10 – 2023-12-11 (×3): 12.5 mg via ORAL
  Filled 2023-12-10 (×3): qty 1

## 2023-12-10 MED ORDER — POTASSIUM CHLORIDE 10 MEQ/50ML IV SOLN
10.0000 meq | INTRAVENOUS | Status: AC
  Administered 2023-12-10 (×3): 10 meq via INTRAVENOUS
  Filled 2023-12-10: qty 50

## 2023-12-10 MED ORDER — FENTANYL CITRATE (PF) 250 MCG/5ML IJ SOLN
INTRAMUSCULAR | Status: AC
  Filled 2023-12-10: qty 5

## 2023-12-10 MED ORDER — METOPROLOL TARTRATE 12.5 MG HALF TABLET: 12.5000 mg | ORAL_TABLET | Freq: Once | ORAL | Status: DC

## 2023-12-10 MED ORDER — ALBUMIN HUMAN 5 % IV SOLN
250.0000 mL | INTRAVENOUS | Status: DC | PRN
  Administered 2023-12-10 (×3): 12.5 g via INTRAVENOUS
  Filled 2023-12-10: qty 250

## 2023-12-10 MED ORDER — ASPIRIN 325 MG PO TBEC
325.0000 mg | DELAYED_RELEASE_TABLET | Freq: Every day | ORAL | Status: DC
  Administered 2023-12-11 – 2023-12-13 (×3): 325 mg via ORAL
  Filled 2023-12-10 (×3): qty 1

## 2023-12-10 MED ORDER — PROTAMINE SULFATE 10 MG/ML IV SOLN
INTRAVENOUS | Status: AC
  Filled 2023-12-10: qty 5

## 2023-12-10 MED ORDER — SODIUM CHLORIDE 0.9 % IV SOLN
20.0000 ug | Freq: Once | INTRAVENOUS | Status: AC
  Administered 2023-12-10: 20 ug via INTRAVENOUS
  Filled 2023-12-10: qty 5

## 2023-12-10 MED ORDER — SODIUM CHLORIDE 0.9% FLUSH
10.0000 mL | Freq: Two times a day (BID) | INTRAVENOUS | Status: DC
  Administered 2023-12-10: 20 mL
  Administered 2023-12-11 – 2023-12-13 (×6): 10 mL

## 2023-12-10 MED ORDER — ONDANSETRON HCL 4 MG/2ML IJ SOLN
4.0000 mg | Freq: Four times a day (QID) | INTRAMUSCULAR | Status: DC | PRN
  Administered 2023-12-11: 4 mg via INTRAVENOUS
  Filled 2023-12-10: qty 2

## 2023-12-10 MED ORDER — PLASMA-LYTE A IV SOLN
INTRAVENOUS | Status: DC | PRN
  Administered 2023-12-10: 500 mL

## 2023-12-10 MED ORDER — TRAMADOL HCL 50 MG PO TABS
50.0000 mg | ORAL_TABLET | ORAL | Status: DC | PRN
  Administered 2023-12-11: 50 mg via ORAL
  Filled 2023-12-10: qty 1

## 2023-12-10 MED ORDER — ASPIRIN 81 MG PO CHEW
324.0000 mg | CHEWABLE_TABLET | Freq: Once | ORAL | Status: AC
  Administered 2023-12-10: 324 mg via ORAL
  Filled 2023-12-10: qty 4

## 2023-12-10 MED ORDER — BISACODYL 5 MG PO TBEC
10.0000 mg | DELAYED_RELEASE_TABLET | Freq: Every day | ORAL | Status: DC
  Administered 2023-12-11: 10 mg via ORAL
  Filled 2023-12-10: qty 2

## 2023-12-10 MED ORDER — MIDAZOLAM HCL 2 MG/2ML IJ SOLN
INTRAMUSCULAR | Status: AC
  Filled 2023-12-10: qty 2

## 2023-12-10 MED ORDER — SODIUM CHLORIDE 0.9 % IV SOLN: INTRAVENOUS | Status: AC

## 2023-12-10 MED ORDER — HEPARIN SODIUM (PORCINE) 1000 UNIT/ML IJ SOLN
INTRAMUSCULAR | Status: DC | PRN
  Administered 2023-12-10: 26000 [IU] via INTRAVENOUS

## 2023-12-10 MED ORDER — ~~LOC~~ CARDIAC SURGERY, PATIENT & FAMILY EDUCATION
Freq: Once | Status: DC
  Filled 2023-12-10: qty 1

## 2023-12-10 MED ORDER — ROCURONIUM BROMIDE 10 MG/ML (PF) SYRINGE
PREFILLED_SYRINGE | INTRAVENOUS | Status: DC | PRN
  Administered 2023-12-10: 50 mg via INTRAVENOUS
  Administered 2023-12-10: 100 mg via INTRAVENOUS
  Administered 2023-12-10 (×2): 20 mg via INTRAVENOUS

## 2023-12-10 MED ORDER — PANTOPRAZOLE SODIUM 40 MG PO TBEC
40.0000 mg | DELAYED_RELEASE_TABLET | Freq: Every day | ORAL | Status: DC
  Administered 2023-12-12 – 2023-12-16 (×5): 40 mg via ORAL
  Filled 2023-12-10 (×5): qty 1

## 2023-12-10 MED ORDER — INSULIN REGULAR(HUMAN) IN NACL 100-0.9 UT/100ML-% IV SOLN: INTRAVENOUS | Status: DC

## 2023-12-10 MED ORDER — SODIUM CHLORIDE 0.9% FLUSH: 10.0000 mL | INTRAVENOUS | Status: DC | PRN

## 2023-12-10 MED ORDER — PROPOFOL 10 MG/ML IV BOLUS: INTRAVENOUS | Status: DC | PRN

## 2023-12-10 MED ORDER — ACETAMINOPHEN 500 MG PO TABS
1000.0000 mg | ORAL_TABLET | Freq: Four times a day (QID) | ORAL | Status: AC
  Administered 2023-12-10 – 2023-12-15 (×16): 1000 mg via ORAL
  Filled 2023-12-10 (×17): qty 2

## 2023-12-10 MED ORDER — SODIUM CHLORIDE 0.45 % IV SOLN: INTRAVENOUS | Status: AC | PRN

## 2023-12-10 MED ORDER — THROMBIN 20000 UNITS EX SOLR
OROMUCOSAL | Status: DC | PRN
  Administered 2023-12-10: 4 mL

## 2023-12-10 MED ORDER — DIAZEPAM 2 MG PO TABS
1.0000 mg | ORAL_TABLET | Freq: Two times a day (BID) | ORAL | Status: DC | PRN
  Administered 2023-12-11: 1 mg via ORAL
  Administered 2023-12-11 – 2023-12-12 (×2): 2 mg via ORAL
  Administered 2023-12-13 – 2023-12-15 (×3): 1 mg via ORAL
  Filled 2023-12-10 (×6): qty 1

## 2023-12-10 MED ORDER — PANTOPRAZOLE SODIUM 40 MG IV SOLR
40.0000 mg | Freq: Every day | INTRAVENOUS | Status: AC
  Administered 2023-12-10 – 2023-12-11 (×2): 40 mg via INTRAVENOUS
  Filled 2023-12-10 (×2): qty 10

## 2023-12-10 MED ORDER — MIDAZOLAM HCL (PF) 5 MG/ML IJ SOLN
INTRAMUSCULAR | Status: DC | PRN
  Administered 2023-12-10 (×2): 2 mg via INTRAVENOUS

## 2023-12-10 MED ORDER — LACTATED RINGERS IV SOLN: INTRAVENOUS | Status: AC

## 2023-12-10 MED ORDER — CHLORHEXIDINE GLUCONATE 4 % EX SOLN: 30.0000 mL | CUTANEOUS | Status: DC

## 2023-12-10 MED ORDER — SODIUM CHLORIDE 0.9 % IV SOLN: 250.0000 mL | INTRAVENOUS | Status: AC

## 2023-12-10 MED ORDER — MAGNESIUM SULFATE 4 GM/100ML IV SOLN
4.0000 g | Freq: Once | INTRAVENOUS | Status: AC
  Administered 2023-12-10: 4 g via INTRAVENOUS
  Filled 2023-12-10: qty 100

## 2023-12-10 MED ORDER — EPHEDRINE SULFATE-NACL 50-0.9 MG/10ML-% IV SOSY
PREFILLED_SYRINGE | INTRAVENOUS | Status: DC | PRN
  Administered 2023-12-10: 5 mg via INTRAVENOUS

## 2023-12-10 MED ORDER — LACTATED RINGERS IV SOLN: INTRAVENOUS | Status: DC | PRN

## 2023-12-10 MED ORDER — BISACODYL 10 MG RE SUPP: 10.0000 mg | Freq: Every day | RECTAL | Status: DC

## 2023-12-10 MED ORDER — ACETAMINOPHEN 160 MG/5ML PO SOLN
650.0000 mg | Freq: Once | ORAL | Status: AC
  Administered 2023-12-10: 650 mg
  Filled 2023-12-10: qty 20.3

## 2023-12-10 MED ORDER — ALBUMIN HUMAN 5 % IV SOLN: INTRAVENOUS | Status: DC | PRN

## 2023-12-10 MED ORDER — POTASSIUM CHLORIDE 10 MEQ/50ML IV SOLN
10.0000 meq | Freq: Once | INTRAVENOUS | Status: AC
Start: 2023-12-10 — End: 2023-12-10
  Administered 2023-12-10: 10 meq via INTRAVENOUS

## 2023-12-10 MED ORDER — STERILE WATER FOR IRRIGATION IR SOLN
Status: DC | PRN
  Administered 2023-12-10: 2000 mL

## 2023-12-10 MED ORDER — CEFAZOLIN SODIUM-DEXTROSE 2-4 GM/100ML-% IV SOLN
2.0000 g | Freq: Three times a day (TID) | INTRAVENOUS | Status: AC
  Administered 2023-12-10 – 2023-12-12 (×6): 2 g via INTRAVENOUS
  Filled 2023-12-10 (×6): qty 100

## 2023-12-10 MED ORDER — SODIUM CHLORIDE 0.9% FLUSH: 3.0000 mL | INTRAVENOUS | Status: DC | PRN

## 2023-12-10 MED ORDER — NITROGLYCERIN IN D5W 200-5 MCG/ML-% IV SOLN: 0.0000 ug/min | INTRAVENOUS | Status: DC

## 2023-12-10 MED ORDER — THROMBIN (RECOMBINANT) 20000 UNITS EX SOLR
CUTANEOUS | Status: AC
  Filled 2023-12-10: qty 20000

## 2023-12-10 MED ORDER — METOCLOPRAMIDE HCL 5 MG/ML IJ SOLN
10.0000 mg | Freq: Four times a day (QID) | INTRAMUSCULAR | Status: AC
  Administered 2023-12-10 – 2023-12-11 (×6): 10 mg via INTRAVENOUS
  Filled 2023-12-10 (×6): qty 2

## 2023-12-10 MED ORDER — ORAL CARE MOUTH RINSE: 15.0000 mL | OROMUCOSAL | Status: DC | PRN

## 2023-12-10 MED ORDER — MORPHINE SULFATE (PF) 2 MG/ML IV SOLN
1.0000 mg | INTRAVENOUS | Status: DC | PRN
  Administered 2023-12-11 (×3): 2 mg via INTRAVENOUS
  Filled 2023-12-10 (×3): qty 1

## 2023-12-10 MED ORDER — PHENYLEPHRINE HCL-NACL 20-0.9 MG/250ML-% IV SOLN: 0.0000 ug/min | INTRAVENOUS | Status: DC

## 2023-12-10 MED ORDER — SODIUM BICARBONATE 8.4 % IV SOLN
50.0000 meq | Freq: Once | INTRAVENOUS | Status: AC
  Administered 2023-12-10: 50 meq via INTRAVENOUS

## 2023-12-10 MED ORDER — ASPIRIN 81 MG PO CHEW: 324.0000 mg | CHEWABLE_TABLET | Freq: Every day | ORAL | Status: DC

## 2023-12-10 MED ORDER — CHLORHEXIDINE GLUCONATE 0.12 % MT SOLN
15.0000 mL | OROMUCOSAL | Status: AC
  Administered 2023-12-10: 15 mL via OROMUCOSAL
  Filled 2023-12-10: qty 15

## 2023-12-10 MED ORDER — OXYCODONE HCL 5 MG PO TABS
5.0000 mg | ORAL_TABLET | ORAL | Status: DC | PRN
  Administered 2023-12-10 – 2023-12-11 (×2): 5 mg via ORAL
  Administered 2023-12-12: 10 mg via ORAL
  Filled 2023-12-10: qty 1
  Filled 2023-12-10: qty 2
  Filled 2023-12-10: qty 1

## 2023-12-10 MED ORDER — SODIUM CHLORIDE 0.9% FLUSH
3.0000 mL | Freq: Two times a day (BID) | INTRAVENOUS | Status: DC
  Administered 2023-12-11 – 2023-12-13 (×6): 3 mL via INTRAVENOUS

## 2023-12-10 MED ORDER — HEMOSTATIC AGENTS (NO CHARGE) OPTIME
TOPICAL | Status: DC | PRN
  Administered 2023-12-10 (×2): 1 via TOPICAL

## 2023-12-10 MED ORDER — PROTAMINE SULFATE 10 MG/ML IV SOLN
INTRAVENOUS | Status: AC
  Filled 2023-12-10: qty 25

## 2023-12-10 MED ORDER — NICARDIPINE HCL IN NACL 20-0.86 MG/200ML-% IV SOLN
3.0000 mg/h | INTRAVENOUS | Status: DC
  Administered 2023-12-10: 15 mg/h via INTRAVENOUS
  Administered 2023-12-10 (×2): 5 mg/h via INTRAVENOUS
  Administered 2023-12-11 (×2): 15 mg/h via INTRAVENOUS
  Administered 2023-12-11: 10.5 mg/h via INTRAVENOUS
  Administered 2023-12-11: 15 mg/h via INTRAVENOUS
  Administered 2023-12-11: 3 mg/h via INTRAVENOUS
  Administered 2023-12-11 (×3): 15 mg/h via INTRAVENOUS
  Administered 2023-12-11: 5 mg/h via INTRAVENOUS
  Administered 2023-12-11: 15 mg/h via INTRAVENOUS
  Administered 2023-12-11 – 2023-12-12 (×3): 12.5 mg/h via INTRAVENOUS
  Administered 2023-12-12 (×2): 15 mg/h via INTRAVENOUS
  Administered 2023-12-12: 10 mg/h via INTRAVENOUS
  Filled 2023-12-10 (×4): qty 200
  Filled 2023-12-10: qty 400
  Filled 2023-12-10 (×3): qty 200
  Filled 2023-12-10: qty 400
  Filled 2023-12-10 (×4): qty 200
  Filled 2023-12-10: qty 400
  Filled 2023-12-10: qty 200
  Filled 2023-12-10: qty 400

## 2023-12-10 MED ORDER — PROPOFOL 10 MG/ML IV BOLUS
INTRAVENOUS | Status: DC | PRN
  Administered 2023-12-10 (×2): 50 mg via INTRAVENOUS
  Administered 2023-12-10: 20 mg via INTRAVENOUS
  Administered 2023-12-10: 50 mg via INTRAVENOUS
  Administered 2023-12-10: 30 mg via INTRAVENOUS

## 2023-12-10 MED ORDER — METOPROLOL TARTRATE 5 MG/5ML IV SOLN
2.5000 mg | INTRAVENOUS | Status: DC | PRN
  Administered 2023-12-11 – 2023-12-12 (×4): 5 mg via INTRAVENOUS
  Filled 2023-12-10 (×4): qty 5

## 2023-12-10 MED ORDER — PROTAMINE SULFATE 10 MG/ML IV SOLN
INTRAVENOUS | Status: DC | PRN
Start: 2023-12-10 — End: 2023-12-10
  Administered 2023-12-10: 260 mg via INTRAVENOUS

## 2023-12-10 MED ORDER — ACETAMINOPHEN 160 MG/5ML PO SOLN: 1000.0000 mg | Freq: Four times a day (QID) | ORAL | Status: AC

## 2023-12-10 MED ORDER — MIDAZOLAM HCL 2 MG/2ML IJ SOLN: 2.0000 mg | INTRAMUSCULAR | Status: DC | PRN

## 2023-12-10 MED ORDER — HEPARIN SODIUM (PORCINE) 1000 UNIT/ML IJ SOLN
INTRAMUSCULAR | Status: AC
  Filled 2023-12-10: qty 1

## 2023-12-10 MED ORDER — PHENYLEPHRINE 80 MCG/ML (10ML) SYRINGE FOR IV PUSH (FOR BLOOD PRESSURE SUPPORT)
PREFILLED_SYRINGE | INTRAVENOUS | Status: DC | PRN
  Administered 2023-12-10: 20 ug via INTRAVENOUS
  Administered 2023-12-10: 60 ug via INTRAVENOUS
  Administered 2023-12-10 (×2): 20 ug via INTRAVENOUS

## 2023-12-10 MED ORDER — SODIUM CHLORIDE 0.9 % IV SOLN: INTRAVENOUS | Status: DC | PRN

## 2023-12-10 MED ORDER — VANCOMYCIN HCL IN DEXTROSE 1-5 GM/200ML-% IV SOLN
1000.0000 mg | Freq: Once | INTRAVENOUS | Status: AC
  Administered 2023-12-10: 1000 mg via INTRAVENOUS
  Filled 2023-12-10: qty 200

## 2023-12-10 MED ORDER — METOPROLOL TARTRATE 25 MG/10 ML ORAL SUSPENSION: 12.5000 mg | Freq: Two times a day (BID) | ORAL | Status: DC

## 2023-12-10 MED ORDER — 0.9 % SODIUM CHLORIDE (POUR BTL) OPTIME
TOPICAL | Status: DC | PRN
  Administered 2023-12-10: 5000 mL

## 2023-12-10 MED ORDER — DEXMEDETOMIDINE HCL IN NACL 400 MCG/100ML IV SOLN
0.0000 ug/kg/h | INTRAVENOUS | Status: DC
Start: 2023-12-10 — End: 2023-12-11

## 2023-12-10 MED ORDER — POTASSIUM CHLORIDE 10 MEQ/50ML IV SOLN
10.0000 meq | INTRAVENOUS | Status: AC
  Administered 2023-12-10 (×3): 10 meq via INTRAVENOUS
  Filled 2023-12-10 (×3): qty 50

## 2023-12-10 SURGICAL SUPPLY — 80 items
ANTIFOG SOL W/FOAM PAD STRL (MISCELLANEOUS) ×2 IMPLANT
BAG DECANTER FOR FLEXI CONT (MISCELLANEOUS) ×2 IMPLANT
BLADE CLIPPER SURG (BLADE) ×2 IMPLANT
BLADE STERNUM SYSTEM 6 (BLADE) ×2 IMPLANT
BLADE SURG 11 STRL SS (BLADE) IMPLANT
BNDG ELASTIC 4INX 5YD STR LF (GAUZE/BANDAGES/DRESSINGS) IMPLANT
BNDG ELASTIC 4X5.8 VLCR STR LF (GAUZE/BANDAGES/DRESSINGS) ×2 IMPLANT
BNDG ELASTIC 6INX 5YD STR LF (GAUZE/BANDAGES/DRESSINGS) ×2 IMPLANT
BNDG GAUZE DERMACEA FLUFF 4 (GAUZE/BANDAGES/DRESSINGS) ×2 IMPLANT
CANISTER SUCT 3000ML PPV (MISCELLANEOUS) ×2 IMPLANT
CANNULA ARTERIAL VENT 3/8 20FR (CANNULA) IMPLANT
CANNULA MC2 2 STG 36/46 CONN (CANNULA) IMPLANT
CATH ROBINSON RED A/P 18FR (CATHETERS) ×4 IMPLANT
CATH THOR STR 28F SOFT WA (CATHETERS) IMPLANT
CATH THORACIC 28FR (CATHETERS) ×2 IMPLANT
CATH THORACIC 36FR (CATHETERS) ×2 IMPLANT
CATH THORACIC 36FR RT ANG (CATHETERS) ×2 IMPLANT
CLIP TI WIDE RED SMALL 24 (CLIP) IMPLANT
CONN ST 1/4X3/8 BEN (MISCELLANEOUS) IMPLANT
CONN Y 3/8X3/8X3/8 BEN (MISCELLANEOUS) IMPLANT
CONTAINER PROTECT SURGISLUSH (MISCELLANEOUS) ×4 IMPLANT
DERMABOND ADVANCED .7 DNX12 (GAUZE/BANDAGES/DRESSINGS) IMPLANT
DRAPE SRG 135X102X78XABS (DRAPES) ×2 IMPLANT
DRAPE WARM FLUID 44X44 (DRAPES) ×2 IMPLANT
DRSG COVADERM 4X14 (GAUZE/BANDAGES/DRESSINGS) ×2 IMPLANT
ELECT CAUTERY BLADE 6.4 (BLADE) ×2 IMPLANT
ELECT REM PT RETURN 9FT ADLT (ELECTROSURGICAL) ×4 IMPLANT
ELECTRODE REM PT RTRN 9FT ADLT (ELECTROSURGICAL) ×4 IMPLANT
FELT TEFLON 1X6 (MISCELLANEOUS) ×2 IMPLANT
GAUZE SPONGE 4X4 12PLY STRL (GAUZE/BANDAGES/DRESSINGS) ×4 IMPLANT
GLOVE BIO SURGEON STRL SZ 6 (GLOVE) IMPLANT
GLOVE BIO SURGEON STRL SZ 6.5 (GLOVE) IMPLANT
GLOVE BIO SURGEON STRL SZ7 (GLOVE) IMPLANT
GLOVE BIOGEL PI IND STRL 6.5 (GLOVE) IMPLANT
GLOVE BIOGEL PI IND STRL 7.0 (GLOVE) IMPLANT
GLOVE BIOGEL PI IND STRL 7.5 (GLOVE) IMPLANT
GLOVE ECLIPSE 7.0 STRL STRAW (GLOVE) ×4 IMPLANT
GLOVE INDICATOR 6.5 STRL GRN (GLOVE) IMPLANT
GOWN STRL REUS W/ TWL LRG LVL3 (GOWN DISPOSABLE) ×8 IMPLANT
GOWN STRL REUS W/ TWL XL LVL3 (GOWN DISPOSABLE) ×2 IMPLANT
HEMOSTAT POWDER SURGIFOAM 1G (HEMOSTASIS) ×6 IMPLANT
HEMOSTAT SURGICEL 2X14 (HEMOSTASIS) ×2 IMPLANT
KIT BASIN OR (CUSTOM PROCEDURE TRAY) ×2 IMPLANT
KIT SUCTION CATH 14FR (SUCTIONS) ×2 IMPLANT
KIT TURNOVER KIT B (KITS) ×2 IMPLANT
KIT VASOVIEW HEMOPRO 2 VH 4000 (KITS) ×2 IMPLANT
KNIFE MICRO-UNI 3.5 30 DEG (BLADE) ×2 IMPLANT
NS IRRIG 1000ML POUR BTL (IV SOLUTION) ×10 IMPLANT
PACK E OPEN HEART (SUTURE) ×2 IMPLANT
PACK OPEN HEART (CUSTOM PROCEDURE TRAY) ×2 IMPLANT
PAD ARMBOARD POSITIONER FOAM (MISCELLANEOUS) ×4 IMPLANT
PAD ELECT DEFIB RADIOL ZOLL (MISCELLANEOUS) ×2 IMPLANT
PENCIL BUTTON HOLSTER BLD 10FT (ELECTRODE) ×2 IMPLANT
POSITIONER HEAD DONUT 9IN (MISCELLANEOUS) ×2 IMPLANT
PUNCH AORTIC ROTATE 4.5MM 8IN (MISCELLANEOUS) ×2 IMPLANT
SEALANT SURG COSEAL 8ML (VASCULAR PRODUCTS) IMPLANT
SET MPS 3-ND DEL (MISCELLANEOUS) IMPLANT
SOLUTION ANTFG W/FOAM PAD STRL (MISCELLANEOUS) IMPLANT
SPONGE T-LAP 18X18 ~~LOC~~+RFID (SPONGE) IMPLANT
STOPCOCK 4 WAY LG BORE MALE ST (IV SETS) IMPLANT
SUPPORT HEART JANKE-BARRON (MISCELLANEOUS) ×2 IMPLANT
SUT BONE WAX W31G (SUTURE) ×2 IMPLANT
SUT MNCRL AB 4-0 PS2 18 (SUTURE) IMPLANT
SUT PROLENE 3 0 SH1 36 (SUTURE) ×2 IMPLANT
SUT PROLENE 4-0 RB1 .5 CRCL 36 (SUTURE) IMPLANT
SUT PROLENE 7 0 BV1 MDA (SUTURE) ×2 IMPLANT
SUT SILK 1 MH (SUTURE) IMPLANT
SUT STEEL 6MS V (SUTURE) IMPLANT
SUT VIC AB 1 CTX36XBRD ANBCTR (SUTURE) ×4 IMPLANT
SUT VIC AB 2-0 CT1 TAPERPNT 27 (SUTURE) IMPLANT
SYSTEM SAHARA CHEST DRAIN ATS (WOUND CARE) ×2 IMPLANT
TAPE CLOTH 4X10 WHT NS (GAUZE/BANDAGES/DRESSINGS) IMPLANT
TAPE CLOTH SOFT 2X10 (GAUZE/BANDAGES/DRESSINGS) IMPLANT
TAPE PAPER 2X10 WHT MICROPORE (GAUZE/BANDAGES/DRESSINGS) IMPLANT
TOWEL GREEN STERILE (TOWEL DISPOSABLE) ×2 IMPLANT
TOWEL GREEN STERILE FF (TOWEL DISPOSABLE) ×2 IMPLANT
TRAY FOLEY SLVR 16FR TEMP STAT (SET/KITS/TRAYS/PACK) ×2 IMPLANT
TUBING LAP HI FLOW INSUFFLATIO (TUBING) ×2 IMPLANT
UNDERPAD 30X36 HEAVY ABSORB (UNDERPADS AND DIAPERS) ×2 IMPLANT
WATER STERILE IRR 1000ML POUR (IV SOLUTION) ×4 IMPLANT

## 2023-12-10 NOTE — Interval H&P Note (Signed)
 History and Physical Interval Note:  12/10/2023 6:12 AM  Marcus Huang  has presented today for surgery, with the diagnosis of CAD.  The various methods of treatment have been discussed with the patient and family. After consideration of risks, benefits and other options for treatment, the patient has consented to  Procedure(s): CORONARY ARTERY BYPASS GRAFTING (CABG) (N/A) ECHOCARDIOGRAM, TRANSESOPHAGEAL, INTRAOPERATIVE (N/A) as a surgical intervention.  The patient's history has been reviewed, patient examined, no change in status, stable for surgery.  I have reviewed the patient's chart and labs.  Questions were answered to the patient's satisfaction.     [name]

## 2023-12-10 NOTE — Procedures (Signed)
 Extubation Procedure Note  Patient Details:   Name: Marcus Huang DOB: 03-01-1951 MRN: [id number]   Airway Documentation:  Airway 8 mm (Active)  Secured at (cm) 22 cm 12/10/23 1255  Measured From Lips 12/10/23 1255  Secured Location Right 12/10/23 1255  Secured By Pink Tape 12/10/23 1255  Bite Block No 12/10/23 1255  Site Condition Dry 12/10/23 1255   Vent end date: 12/10/23 Vent end time: 1720   Evaluation  O2 sats: stable throughout Complications: No apparent complications Patient did tolerate procedure well. Bilateral Breath Sounds: Diminished, Clear   Yes pt able to speak, no stridor noted.  No distress noted.  VC 1050 ml, NIF -35, + cuff leak test prior to extubation.  IS approx 500 ml.  Pt denies any SOB.    Jennette Kettle 12/10/2023, 6:00 PM

## 2023-12-10 NOTE — Anesthesia Procedure Notes (Signed)
 Central Venous Catheter Insertion Performed by: Leonides Grills, MD, anesthesiologist Start/End3/27/2025 6:50 AM, 12/10/2023 7:00 AM Patient location: Pre-op. Preanesthetic checklist: patient identified, IV checked, site marked, risks and benefits discussed, surgical consent, monitors and equipment checked, pre-op evaluation, timeout performed and anesthesia consent Position: reverse Trendelenburg Hand hygiene performed  and maximum sterile barriers used  PA cath was placed.Swan type:thermodilution PA Cath depth:45 Procedure performed without using ultrasound guided technique. Attempts: 1 Patient tolerated the procedure well with no immediate complications.

## 2023-12-10 NOTE — Discharge Instructions (Signed)
 Discharge Instructions:  1. You may shower, please wash incisions daily with soap and water and keep dry.  If you wish to cover wounds with dressing you may do so but please keep clean and change daily.  No tub baths or swimming until incisions have completely healed.  If your incisions become red or develop any drainage please call our office at [phone]  2. No Driving until cleared by Dr. Sharee Pimple office and you are no longer using narcotic pain medications  3. Monitor your weight daily.. Please use the same scale and weigh at same time... If you gain 5-10 lbs in 48 hours with associated lower extremity swelling, please contact our office at [phone]  4. Fever of 101.5 for at least 24 hours with no source, please contact our office at [phone]  5. Activity- up as tolerated, please walk at least 3 times per day.  Avoid strenuous activity, no lifting, pushing, or pulling with your arms over 8-10 lbs for a minimum of 6 weeks  6. If any questions or concerns arise, please do not hesitate to contact our office at [phone]

## 2023-12-10 NOTE — Op Note (Signed)
 CARDIOVASCULAR SURGERY OPERATIVE NOTE  12/10/2023  Surgeon:  [name], MD  First Assistant: Aloha Gell,  PA-C:   An experienced assistant was required given the complexity of this surgery and the standard of surgical care. The assistant was needed for endoscopic vein harvest, exposure, dissection, suctioning, retraction of delicate tissues and sutures, instrument exchange and for overall help during this procedure.   Preoperative Diagnosis:  Severe multi-vessel coronary artery disease   Postoperative Diagnosis:  Same   Procedure:  Median Sternotomy Extracorporeal circulation 3.   Coronary artery bypass grafting x 3  Left internal mammary artery graft to the LAD SVG to diagonal SVG to OM  4.   Endoscopic vein harvest from the right leg   Anesthesia:  General Endotracheal   Clinical History/Surgical Indication:  This 73 year old gentleman has severe three-vessel coronary disease with high-grade in-stent restenosis in the LAD and RCA territory.  He has a high risk nuclear stress test and symptoms suggesting ischemia.  I agree that coronary artery bypass graft surgery is the best treatment to prevent further ischemia and infarction and improve his quality of life. I discussed the operative procedure with the patient and his wife including alternatives, benefits and risks; including but not limited to bleeding, blood transfusion, infection, stroke, myocardial infarction, graft failure, heart block requiring a permanent pacemaker, organ dysfunction, and death.  Viona Gilmore understands and agrees to proceed.        Preparation:  The patient was seen in the preoperative holding area and the correct patient, correct operation were confirmed with the patient after reviewing the medical record and catheterization. The consent was signed by me. Preoperative antibiotics were given. A  pulmonary arterial line and radial arterial line were placed by the anesthesia team. The patient was taken back to the operating room and positioned supine on the operating room table. After being placed under general endotracheal anesthesia by the anesthesia team a foley catheter was placed. The neck, chest, abdomen, and both legs were prepped with betadine soap and solution and draped in the usual sterile manner. A surgical time-out was taken and the correct patient and operative procedure were confirmed with the nursing and anesthesia staff.  TEE: performed by Dr. Bradley Ferris. This showed normal LV systolic function with trivial MR and TR.  Cardiopulmonary Bypass:  A median sternotomy was performed. The pericardium was opened in the midline. Right ventricular function appeared normal. The ascending aorta was of normal size and had no palpable plaque. There were no contraindications to aortic cannulation or cross-clamping. The patient was fully systemically heparinized and the ACT was maintained > 400 sec. The proximal aortic arch was cannulated with a 20 F aortic cannula for arterial inflow. Venous cannulation was performed via the right atrial appendage using a two-staged venous cannula. An antegrade cardioplegia/vent cannula was inserted into the mid-ascending aorta. Aortic occlusion was performed with a single cross-clamp. Systemic cooling to 32 degrees Centigrade and topical cooling of the heart with iced saline were used. Hyperkalemic antegrade cold blood cardioplegia was used to induce diastolic arrest and was then given at about 20 minute intervals throughout the period of arrest to maintain myocardial temperature at or below 10 degrees centigrade. A temperature probe was inserted into the interventricular septum and an insulating pad was placed in the pericardium.   Left internal mammary artery harvest:  The left side of the sternum was retracted using the Rultract retractor. The left internal  mammary artery was harvested as a pedicle graft.  All side branches were clipped. It was a medium-sized vessel of good quality with excellent blood flow. It was ligated distally and divided. It was sprayed with topical papaverine solution to prevent vasospasm.   Endoscopic vein harvest: performed by Aloha Gell, PA-C  The right greater saphenous vein was harvested endoscopically through a 2 cm incision medial to the right knee. It was harvested from the upper thigh to below the knee. It was a medium-sized vein of good quality. The side branches were all ligated with 4-0 silk ties.    Coronary arteries:  The coronary arteries were examined. He has severe diffuse disease  LAD:  diffuse disease throughout the proximal and mid vessel. The distal vessel beyond the 75% stenosis was mildly diseased and soft enough to graft. The LAD beyond the distal stent was small and diffusely diseased and not graftable. LCX:  Large OM with no distal disease RCA:  diffusely diseased with calcific plaque. The distal vessel beyond the distal stent was small and diffusely diseased and not graftable.   Grafts: performed by me and assisted by Aloha Gell, PA-C  LIMA to the LAD: 2.0 mm. It was sewn end to side using 8-0 prolene continuous suture. SVG to diagonal:  1.5 mm. It was sewn end to side using 7-0 prolene continuous suture. SVG to OM:  1.75 mm. It was sewn end to side using 7-0 prolene continuous suture.   The proximal vein graft anastomoses were performed to the mid-ascending aorta using continuous 6-0 prolene suture. Graft markers were placed around the proximal anastomoses.   Completion:  The patient was rewarmed to 37 degrees Centigrade. The clamp was removed from the LIMA pedicle and there was rapid warming of the septum and return of ventricular fibrillation. The crossclamp was removed with a time of 74 minutes. There was spontaneous return of sinus rhythm. The distal and proximal anastomoses  were checked for hemostasis. The position of the grafts was satisfactory. Two temporary epicardial pacing wires were placed on the right atrium and two on the right ventricle. The patient was weaned from CPB without difficulty on no inotropes. CPB time was 91 minutes. Cardiac output was 5 LPM. TEE showed normal LV systolic function. Heparin was fully reversed with protamine and the aortic and venous cannulas removed. Hemostasis was achieved. Mediastinal and left pleural drainage tubes were placed. The sternum was closed with #6 stainless steel wires. The fascia was closed with continuous # 1 vicryl suture. The subcutaneous tissue was closed with 2-0 vicryl continuous suture. The skin was closed with 3-0 vicryl subcuticular suture. All sponge, needle, and instrument counts were reported correct at the end of the case. Dry sterile dressings were placed over the incisions and around the chest tubes which were connected to pleurevac suction. The patient was then transported to the surgical intensive care unit in stable condition.

## 2023-12-10 NOTE — Hospital Course (Addendum)
 HPI: The patient is a 73 year old gentleman with a history of hypertension, hyperlipidemia, OSA on CPAP, moderate left carotid artery stenosis, type 2 diabetes, and coronary artery disease status post overlapping DES to the distal RCA on 08/05/2022 and staged PCI with DES to the distal LAD on 08/28/2022.  He has been followed by Dr. Darrold Junker.  He recently presented with episodes of his heart pounding at night and had a 72-hour Holter monitor showing occasional PACs and PVCs.  A 2D echocardiogram on 10/27/2023 showed an ejection fraction of greater than 55%.  There is no significant valvular abnormality.  A Lexiscan Myoview on 10/27/2023 showed apical, inferior, and septal ischemia.  Cardiac catheterization on 11/11/2023 showed severe three-vessel coronary disease with diffuse in-stent restenosis in the proximal and distal RCA, diffuse in-stent restenosis in the distal LAD at the apex, 85% proximal and 75% mid LAD stenoses, and a 75% stenosis and a large first marginal branch.  Left ventricular ejection fraction was 55 to 65% by visual estimate with a mildly elevated LVEDP.   He is married and lives with his wife.  He has noted worsening exertional fatigue as well as some chest discomfort with moderate exertion.  He has noticed some shortness of breath with lying flat.  Dr. Laneta Simmers reviewed the patient's diagnostic studies and determined he would benefit from surgical intervention. He reviewed the treatment options as well as the risks and benefits of surgery with the patient. Mr. Joubert was agreeable to proceed with surgery.  Hospital Course:  Mr. Redner arrived at [facility] and was brought to the operating room on 12/10/23. He underwent CABG x 3 utilizing LIMA to LAD, SVG to OM and SVG to Diagonal as well as endoscopic harvest of the right saphenous vein. He tolerated the procedure well and was transferred to the SICU in stable condition. He was extubated the evening of surgery without  complication. He required some Cardene for hypertension. Low dose Lopressor and Norvasc 5mg  BID was started and Cardene was weaned as able. He was routinely diuresed. Preop A1C was 5.6. CBGs were controlled, Insulin drip was transition to SSI. Epicardial pacing wires and chest tubes were removed on POD1 without complication.  He developed respiratory issues and required assistance with high oxygen via NRB.  He was hypertensive and started on Norvasc and Loressor.  He was aggressively diuresed with Lasix and Metolazone with good response.  He developed Atrial Fibrillation with RVR and was treated with Amiodarone bolus and drip protocol.  He required additional Amiodarone bolus, but converted to NSR on 03/30. He was hypokalemic due to aggressive diuretics and was supplemented accordingly. IV Amiodarone was transitioned to PO Amiodarone on 03/31. He was felt stable for transfer to the progressive unit. He remained hypertensive and was started on Lisinopril. PT/OT evaluations recommended no acute follow up other than cardiac rehab when cleared. His bowels were moving appropriately. He was ambulating well on room air. His incisions were healing well without sign of infection. He was felt stable for discharge.

## 2023-12-10 NOTE — Anesthesia Preprocedure Evaluation (Addendum)
 Anesthesia Evaluation  Patient identified by MRN, date of birth, ID band Patient awake    Reviewed: Allergy & Precautions, NPO status , Patient's Chart, lab work & pertinent test results  Airway Mallampati: II       Dental no notable dental hx.    Pulmonary sleep apnea and Continuous Positive Airway Pressure Ventilation    Pulmonary exam normal        Cardiovascular hypertension, Pt. on medications and Pt. on home beta blockers + CAD, + Cardiac Stents and + Peripheral Vascular Disease  Normal cardiovascular exam     Neuro/Psych negative neurological ROS  negative psych ROS   GI/Hepatic negative GI ROS, Neg liver ROS,,,  Endo/Other  diabetes    Renal/GU negative Renal ROS     Musculoskeletal negative musculoskeletal ROS (+)    Abdominal   Peds  Hematology negative hematology ROS (+)   Anesthesia Other Findings CAD  Reproductive/Obstetrics                             Anesthesia Physical Anesthesia Plan  ASA: 4  Anesthesia Plan: General   Post-op Pain Management:    Induction: Intravenous  PONV Risk Score and Plan: 2 and Ondansetron, Dexamethasone, Midazolam and Treatment may vary due to age or medical condition  Airway Management Planned: Oral ETT  Additional Equipment: Arterial line, CVP, PA Cath, Ultrasound Guidance Line Placement and TEE  Intra-op Plan:   Post-operative Plan: Post-operative intubation/ventilation  Informed Consent: I have reviewed the patients History and Physical, chart, labs and discussed the procedure including the risks, benefits and alternatives for the proposed anesthesia with the patient or authorized representative who has indicated his/her understanding and acceptance.     Dental advisory given  Plan Discussed with: CRNA  Anesthesia Plan Comments:         Anesthesia Quick Evaluation

## 2023-12-10 NOTE — Progress Notes (Signed)
 RN called for vent wean initiation. Pt placed on rate 4, 40% per protocol.  Currently pt appears to be tol well.

## 2023-12-10 NOTE — Progress Notes (Signed)
  Echocardiogram Echocardiogram Transesophageal has been performed.  Marcus Huang 12/10/2023, 8:47 AM

## 2023-12-10 NOTE — Brief Op Note (Addendum)
 12/10/2023  12:33 PM  PATIENT:  Marcus Huang  73 y.o. male  PRE-OPERATIVE DIAGNOSIS:  Coronary Artery Disease  POST-OPERATIVE DIAGNOSIS:  Coronary Artery Disease  PROCEDURE:  CORONARY ARTERY BYPASS GRAFTING (CABG) xTHREE USING LEFT INTERNAL MAMMARY ARTERY AND ENDOSCOPICALLY HARVESTED RIGHT GREATER SAPPHENOUS VEIN ECHOCARDIOGRAM, TRANSESOPHAGEAL, INTRAOPERATIVE  Vein harvest time: Vein prep time: -LIMA to LAD -SVG to OM -SVG to Diagonal  SURGEON:  Surgeons and Role:    * [name], MD - Primary  PHYSICIAN ASSISTANT: Aloha Gell PA-C  ASSISTANTS: Alyce Pagan RNFA   ANESTHESIA:   general  EBL:  Per perfusion records  BLOOD ADMINISTERED:none  DRAINS:  Mediastinal and pleural drains    LOCAL MEDICATIONS USED:  NONE  SPECIMEN:  No Specimen  DISPOSITION OF SPECIMEN:  N/A  COUNTS:  YES  DICTATION: .Dragon Dictation  PLAN OF CARE: Admit to inpatient   PATIENT DISPOSITION:  ICU - intubated and hemodynamically stable.   Delay start of Pharmacological VTE agent (>24hrs) due to surgical blood loss or risk of bleeding: yes

## 2023-12-10 NOTE — Anesthesia Postprocedure Evaluation (Signed)
 Anesthesia Post Note  Patient: Marcus Huang  Procedure(s) Performed: CORONARY ARTERY BYPASS GRAFTING (CABG) xTHREE USING LEFT INTERNAL MAMMARY ARTERY AND ENDOSCOPICALLY HARVESTED RIGHT GREATER SAPPHENOUS VEIN (Chest) ECHOCARDIOGRAM, TRANSESOPHAGEAL, INTRAOPERATIVE     Patient location during evaluation: ICU Anesthesia Type: General Level of consciousness: sedated Pain management: pain level controlled Vital Signs Assessment: post-procedure vital signs reviewed and stable Respiratory status: patient remains intubated per anesthesia plan Cardiovascular status: stable Postop Assessment: no apparent nausea or vomiting Anesthetic complications: no   No notable events documented.  Last Vitals:  Vitals:   12/10/23 1510 12/10/23 1515  BP:    Pulse: 84 88  Resp: (!) 22 (!) 21  Temp: (!) 36.1 C (!) 36.1 C  SpO2: 100% 100%    Last Pain:  Vitals:   12/10/23 1300  TempSrc: Core  PainSc:                  Catheryn Bacon Sundy Houchins

## 2023-12-10 NOTE — Anesthesia Procedure Notes (Signed)
 Arterial Line Insertion Start/End3/27/2025 6:35 AM, 12/10/2023 6:45 AM Performed by: Samara Deist, CRNA, CRNA  Preanesthetic checklist: patient identified, IV checked, site marked, risks and benefits discussed, surgical consent, monitors and equipment checked, pre-op evaluation, timeout performed and anesthesia consent Lidocaine 1% used for infiltration Left, radial was placed Catheter size: 22 G Hand hygiene performed  and maximum sterile barriers used  Allen's test indicative of satisfactory collateral circulation Attempts: 1 Procedure performed without using ultrasound guided technique. Following insertion, dressing applied and Biopatch. Post procedure assessment: normal and unchanged  Patient tolerated the procedure well with no immediate complications. Additional procedure comments: By Sherron Flemings.

## 2023-12-10 NOTE — Anesthesia Procedure Notes (Signed)
 Central Venous Catheter Insertion Performed by: Leonides Grills, MD, anesthesiologist Start/End3/27/2025 6:40 AM, 12/10/2023 6:50 AM Patient location: Pre-op. Preanesthetic checklist: patient identified, IV checked, site marked, risks and benefits discussed, surgical consent, monitors and equipment checked, pre-op evaluation, timeout performed and anesthesia consent Position: Trendelenburg Lidocaine 1% used for infiltration and patient sedated Hand hygiene performed  and maximum sterile barriers used  Catheter size: 9 Fr Total catheter length 12. MAC introducer Procedure performed using ultrasound guided technique. Ultrasound Notes:anatomy identified, needle tip was noted to be adjacent to the nerve/plexus identified, no ultrasound evidence of intravascular and/or intraneural injection and image(s) printed for medical record Attempts: 1 Following insertion, line sutured and dressing applied. Post procedure assessment: blood return through all ports, free fluid flow and no air  Patient tolerated the procedure well with no immediate complications.

## 2023-12-10 NOTE — Transfer of Care (Signed)
 Immediate Anesthesia Transfer of Care Note  Patient: NIMESH RIOLO  Procedure(s) Performed: CORONARY ARTERY BYPASS GRAFTING (CABG) xTHREE USING LEFT INTERNAL MAMMARY ARTERY AND ENDOSCOPICALLY HARVESTED RIGHT GREATER SAPPHENOUS VEIN (Chest) ECHOCARDIOGRAM, TRANSESOPHAGEAL, INTRAOPERATIVE  Patient Location: ICU  Anesthesia Type:General  Level of Consciousness: sedated and Patient remains intubated per anesthesia plan  Airway & Oxygen Therapy: Patient remains intubated per anesthesia plan and Patient placed on Ventilator (see vital sign flow sheet for setting)  Post-op Assessment: Report given to RN and Post -op Vital signs reviewed and stable  Post vital signs: Reviewed and stable  Last Vitals:  Vitals Value Taken Time  BP 97/61 12/10/23 1301  Temp 35.3 C 12/10/23 1309  Pulse 80 12/10/23 1309  Resp 16 12/10/23 1309  SpO2 97 % 12/10/23 1309  Vitals shown include unfiled device data.  Last Pain:  Vitals:   12/10/23 0611  TempSrc:   PainSc: 0-No pain         Complications: No notable events documented.

## 2023-12-10 NOTE — Anesthesia Procedure Notes (Signed)
 Procedure Name: Intubation Date/Time: 12/10/2023 7:45 AM  Performed by: Samara Deist, CRNAPre-anesthesia Checklist: Patient identified, Emergency Drugs available, Suction available and Patient being monitored Patient Re-evaluated:Patient Re-evaluated prior to induction Oxygen Delivery Method: Circle system utilized Preoxygenation: Pre-oxygenation with 100% oxygen Induction Type: IV induction Ventilation: Two handed mask ventilation required and Oral airway inserted - appropriate to patient size Laryngoscope Size: 2 Grade View: Grade I Tube type: Oral Tube size: 8.0 mm Number of attempts: 1 Airway Equipment and Method: Stylet and Oral airway Placement Confirmation: ETT inserted through vocal cords under direct vision, positive ETCO2 and breath sounds checked- equal and bilateral Secured at: 22 cm Tube secured with: Tape Dental Injury: Teeth and Oropharynx as per pre-operative assessment  Comments: Airway by Sherron Flemings

## 2023-12-11 ENCOUNTER — Encounter (HOSPITAL_COMMUNITY): Payer: Self-pay | Admitting: Surgery

## 2023-12-11 ENCOUNTER — Inpatient Hospital Stay (HOSPITAL_COMMUNITY)

## 2023-12-11 LAB — BASIC METABOLIC PANEL WITH GFR
Anion gap: 3 — ABNORMAL LOW (ref 5–15)
Anion gap: 9 (ref 5–15)
BUN: 14 mg/dL (ref 8–23)
BUN: 17 mg/dL (ref 8–23)
CO2: 23 mmol/L (ref 22–32)
CO2: 22 mmol/L (ref 22–32)
Calcium: 7.5 mg/dL — ABNORMAL LOW (ref 8.9–10.3)
Calcium: 7.6 mg/dL — ABNORMAL LOW (ref 8.9–10.3)
Chloride: 111 mmol/L (ref 98–111)
Chloride: 107 mmol/L (ref 98–111)
Creatinine, Ser: 0.91 mg/dL (ref 0.61–1.24)
Creatinine, Ser: 1.16 mg/dL (ref 0.61–1.24)
GFR, Estimated: >60 mL/min (ref >60)
GFR, Estimated: >60 mL/min (ref >60)
Glucose, Bld: 104 mg/dL — ABNORMAL HIGH (ref 70–99)
Glucose, Bld: 173 mg/dL — ABNORMAL HIGH (ref 70–99)
Potassium: 3.9 mmol/L (ref 3.5–5.1)
Potassium: 3.7 mmol/L (ref 3.5–5.1)
Sodium: 137 mmol/L (ref 135–145)
Sodium: 138 mmol/L (ref 135–145)

## 2023-12-11 LAB — CBC
HCT: 26 % — ABNORMAL LOW (ref 39.0–52.0)
HCT: 26.6 % — ABNORMAL LOW (ref 39.0–52.0)
Hemoglobin: 9.1 g/dL — ABNORMAL LOW (ref 13.0–17.0)
Hemoglobin: 9.3 g/dL — ABNORMAL LOW (ref 13.0–17.0)
MCH: 31.4 pg (ref 26.0–34.0)
MCH: 31.4 pg (ref 26.0–34.0)
MCHC: 35 g/dL (ref 30.0–36.0)
MCHC: 35 g/dL (ref 30.0–36.0)
MCV: 89.7 fL (ref 80.0–100.0)
MCV: 89.9 fL (ref 80.0–100.0)
Platelets: 131 10*3/uL — ABNORMAL LOW (ref 150–400)
Platelets: 135 10*3/uL — ABNORMAL LOW (ref 150–400)
RBC: 2.9 MIL/uL — ABNORMAL LOW (ref 4.22–5.81)
RBC: 2.96 MIL/uL — ABNORMAL LOW (ref 4.22–5.81)
RDW: 13.8 % (ref 11.5–15.5)
RDW: 14.1 % (ref 11.5–15.5)
WBC: 9.2 10*3/uL (ref 4.0–10.5)
WBC: 11.1 10*3/uL — ABNORMAL HIGH (ref 4.0–10.5)
nRBC: 0 % (ref 0.0–0.2)
nRBC: 0 % (ref 0.0–0.2)

## 2023-12-11 LAB — GLUCOSE, CAPILLARY
Glucose-Capillary: 100 mg/dL — ABNORMAL HIGH (ref 70–99)
Glucose-Capillary: 106 mg/dL — ABNORMAL HIGH (ref 70–99)
Glucose-Capillary: 106 mg/dL — ABNORMAL HIGH (ref 70–99)
Glucose-Capillary: 111 mg/dL — ABNORMAL HIGH (ref 70–99)
Glucose-Capillary: 115 mg/dL — ABNORMAL HIGH (ref 70–99)
Glucose-Capillary: 121 mg/dL — ABNORMAL HIGH (ref 70–99)
Glucose-Capillary: 121 mg/dL — ABNORMAL HIGH (ref 70–99)
Glucose-Capillary: 124 mg/dL — ABNORMAL HIGH (ref 70–99)
Glucose-Capillary: 127 mg/dL — ABNORMAL HIGH (ref 70–99)
Glucose-Capillary: 129 mg/dL — ABNORMAL HIGH (ref 70–99)
Glucose-Capillary: 133 mg/dL — ABNORMAL HIGH (ref 70–99)
Glucose-Capillary: 136 mg/dL — ABNORMAL HIGH (ref 70–99)
Glucose-Capillary: 142 mg/dL — ABNORMAL HIGH (ref 70–99)
Glucose-Capillary: 144 mg/dL — ABNORMAL HIGH (ref 70–99)
Glucose-Capillary: 149 mg/dL — ABNORMAL HIGH (ref 70–99)
Glucose-Capillary: 152 mg/dL — ABNORMAL HIGH (ref 70–99)
Glucose-Capillary: 153 mg/dL — ABNORMAL HIGH (ref 70–99)
Glucose-Capillary: 161 mg/dL — ABNORMAL HIGH (ref 70–99)
Glucose-Capillary: 89 mg/dL (ref 70–99)
Glucose-Capillary: 97 mg/dL (ref 70–99)
Glucose-Capillary: 97 mg/dL (ref 70–99)

## 2023-12-11 LAB — MAGNESIUM
Magnesium: 2.7 mg/dL — ABNORMAL HIGH (ref 1.7–2.4)
Magnesium: 2.4 mg/dL (ref 1.7–2.4)

## 2023-12-11 MED ORDER — INSULIN ASPART 100 UNIT/ML IJ SOLN
0.0000 [IU] | INTRAMUSCULAR | Status: DC
  Administered 2023-12-11: 4 [IU] via SUBCUTANEOUS
  Administered 2023-12-11 (×2): 2 [IU] via SUBCUTANEOUS
  Administered 2023-12-12: 4 [IU] via SUBCUTANEOUS
  Administered 2023-12-12 – 2023-12-13 (×5): 2 [IU] via SUBCUTANEOUS

## 2023-12-11 MED ORDER — AMLODIPINE BESYLATE 5 MG PO TABS
5.0000 mg | ORAL_TABLET | Freq: Two times a day (BID) | ORAL | Status: DC
  Administered 2023-12-11 (×2): 5 mg via ORAL
  Filled 2023-12-11 (×2): qty 1

## 2023-12-11 MED ORDER — FUROSEMIDE 10 MG/ML IJ SOLN
80.0000 mg | Freq: Once | INTRAMUSCULAR | Status: AC
  Administered 2023-12-11: 80 mg via INTRAVENOUS
  Filled 2023-12-11: qty 8

## 2023-12-11 MED ORDER — POTASSIUM CHLORIDE CRYS ER 20 MEQ PO TBCR
20.0000 meq | EXTENDED_RELEASE_TABLET | Freq: Three times a day (TID) | ORAL | Status: AC
  Administered 2023-12-11 (×3): 20 meq via ORAL
  Filled 2023-12-11 (×3): qty 1

## 2023-12-11 MED ORDER — FUROSEMIDE 10 MG/ML IJ SOLN
40.0000 mg | Freq: Two times a day (BID) | INTRAMUSCULAR | Status: AC
  Administered 2023-12-11 (×2): 40 mg via INTRAVENOUS
  Filled 2023-12-11 (×2): qty 4

## 2023-12-11 NOTE — Discharge Summary (Signed)
 [address]       [place] 12/21/1950             [phone]    Physician Discharge Summary  Patient ID: Marcus Huang MRN: [id number] DOB/AGE: 71-Jan-1952 71 y.o.  Admit date: 12/10/2023 Discharge date: 12/16/2023  Admission Diagnoses:  Patient Active Problem List   Diagnosis Date Noted   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/12/2022   Lymphedema 08/24/2022   Raynaud's disease 08/24/2022   PAD (peripheral artery disease) (HCC) 08/24/2022   S/P drug eluting coronary stent placement 08/15/2022   CAD (coronary artery disease) 08/05/2022   Screen for colon cancer 03/14/2020   Special screening for malignant neoplasms, colon    Peyronie's disease 07/16/2019   Renal mass 07/10/2019   Heart palpitations 06/27/2019   Carotid artery stenosis 06/02/2019   Umbilical hernia without obstruction and without gangrene 06/02/2019   Aortic atherosclerosis (HCC) 06/02/2019   Hypertensive urgency 05/31/2019   Non-recurrent bilateral inguinal hernia without obstruction or gangrene    Diabetes type 2, uncontrolled 02/14/2014   Hyperlipidemia, unspecified 02/14/2014   Malignant hypertension 02/14/2014   Type 2 diabetes mellitus without complication, without long-term current use of insulin (HCC) 02/14/2014   Hyperlipidemia 02/14/2014   Hypertension, essential, benign 02/14/2014    Discharge Diagnoses:  Patient Active Problem List   Diagnosis Date Noted   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/12/2022   Lymphedema 08/24/2022   Raynaud's disease 08/24/2022   PAD (peripheral artery disease) (HCC) 08/24/2022   S/P drug eluting coronary stent placement 08/15/2022   CAD (coronary artery disease) 08/05/2022   Screen for colon cancer 03/14/2020   Special screening for malignant neoplasms, colon    Peyronie's disease 07/16/2019   Renal mass  07/10/2019   Heart palpitations 06/27/2019   Carotid artery stenosis 06/02/2019   Umbilical hernia without obstruction and without gangrene 06/02/2019   Aortic atherosclerosis (HCC) 06/02/2019   Hypertensive urgency 05/31/2019   Non-recurrent bilateral inguinal hernia without obstruction or gangrene    Diabetes type 2, uncontrolled 02/14/2014   Hyperlipidemia, unspecified 02/14/2014   Malignant hypertension 02/14/2014   Type 2 diabetes mellitus without complication, without long-term current use of insulin (HCC) 02/14/2014   Hyperlipidemia 02/14/2014   Hypertension, essential, benign 02/14/2014    Discharged Condition: stable  HPI: The patient is a 73 year old gentleman with a history of hypertension, hyperlipidemia, OSA on CPAP, moderate left carotid artery stenosis, type 2 diabetes, and coronary artery disease status post overlapping DES to the distal RCA on 08/05/2022 and staged PCI with DES to the distal LAD on 08/28/2022.  He has been followed by Dr. Darrold Junker.  He recently presented with episodes of his heart pounding at night and had a 72-hour Holter monitor showing occasional PACs and PVCs.  A 2D echocardiogram on 10/27/2023 showed an ejection fraction of greater than 55%.  There is no significant valvular abnormality.  A Lexiscan Myoview on 10/27/2023 showed apical, inferior, and septal ischemia.  Cardiac catheterization on 11/11/2023 showed severe three-vessel coronary disease with diffuse in-stent restenosis in the proximal and distal RCA, diffuse in-stent restenosis in the distal LAD at the apex, 85% proximal and 75% mid LAD stenoses, and a 75% stenosis and a large first marginal branch.  Left ventricular ejection fraction was 55 to 65% by visual estimate with a mildly elevated LVEDP.   He is married and lives with his  wife.  He has noted worsening exertional fatigue as well as some chest discomfort with moderate exertion.  He has noticed some shortness of breath with lying flat.  Dr.  Laneta Simmers reviewed the patient's diagnostic studies and determined he would benefit from surgical intervention. He reviewed the treatment options as well as the risks and benefits of surgery with the patient. Marcus Huang was agreeable to proceed with surgery.  Hospital Course:  Marcus Huang arrived at [facility] and was brought to the operating room on 12/10/23. He underwent CABG x 3 utilizing LIMA to LAD, SVG to OM and SVG to Diagonal as well as endoscopic harvest of the right saphenous vein. He tolerated the procedure well and was transferred to the SICU in stable condition. He was extubated the evening of surgery without complication. He required some Cardene for hypertension. Low dose Lopressor and Norvasc 5mg  BID was started and Cardene was weaned as able. He was routinely diuresed. Preop A1C was 5.6. CBGs were controlled, Insulin drip was transition to SSI. Epicardial pacing wires and chest tubes were removed on POD1 without complication.  He developed respiratory issues and required assistance with high oxygen via NRB.  He was hypertensive and started on Norvasc and Loressor.  He was aggressively diuresed with Lasix and Metolazone with good response.  He developed Atrial Fibrillation with RVR and was treated with Amiodarone bolus and drip protocol.  He required additional Amiodarone bolus, but converted to NSR on 03/30. He was hypokalemic due to aggressive diuretics and was supplemented accordingly. IV Amiodarone was transitioned to PO Amiodarone on 03/31. He was felt stable for transfer to the progressive unit. He remained hypertensive and was started on Lisinopril. PT/OT evaluations recommended no acute follow up other than cardiac rehab when cleared. His bowels were moving appropriately. He was ambulating well on room air. His incisions were healing well without sign of infection. He was felt stable for discharge on 12/16/23.  Consults: None  Significant Diagnostic Studies:  LEFT HEART CATH  AND CORONARY ANGIOGRAPHY     Prox RCA lesion is 95% stenosed.   Mid RCA lesion is 90% stenosed.   Dist RCA lesion is 99% stenosed.   2nd Mrg lesion is 75% stenosed.   Prox Cx lesion is 70% stenosed.   Prox LAD to Mid LAD lesion is 85% stenosed.   Mid LAD lesion is 75% stenosed.   Dist LAD lesion is 95% stenosed.   The left ventricular systolic function is normal.   LV end diastolic pressure is mildly elevated.   The left ventricular ejection fraction is 55-65% by visual estimate.   1.  Three-vessel coronary artery disease with diffuse in-stent restenosis proximal and distal RCA, diffuse in-stent restenosis distal LAD, 85% stenosis proximal and 75% stenosis mid LAD, 75% stenosis OM1 branch 2.  Normal left ventricular function   *INTRAOPERATIVE TRANSESOPHAGEAL REPORT *   Patient Name:   TAHJIR SILVERIA Date of Exam: 12/10/2023  Medical Rec #:  [id number]      Height:       68.0 in  Accession #:    [id number]     Weight:       163.0 lb  Date of Birth:  [birth year]       BSA:          1.87 m  Patient Age:    72 years       BP:           174/93 mmHg  Patient Gender: M  HR:           51 bpm.  Exam Location:  Anesthesiology   Transesophogeal exam was perform intraoperatively during surgical  procedure.  Patient was closely monitored under general anesthesia during the entirety  of  examination.   Indications:    I25.110 Atherosclerotic heart disease of native coronary  artery                  with unstable angina pectoris  Performing Phys: Karna Christmas MD  Diagnosing Phys: Karna Christmas MD   Complications: No known complications during this procedure.  POST-OP IMPRESSIONS  Overall, there were no significant changes from pre-bypass.  _ Left Ventricle: The left ventricle is unchanged from pre-bypass.  _ Right Ventricle: The right ventricle appears unchanged from pre-bypass.  _ Aorta: The aorta appears unchanged from pre-bypass.  _ Left Atrium: The left atrium appears  unchanged from pre-bypass.  _ Left Atrial Appendage: The left atrial appendage appears unchanged from  pre-bypass.  _ Aortic Valve: The aortic valve appears unchanged from pre-bypass.  _ Mitral Valve: The mitral valve appears unchanged from pre-bypass.  _ Tricuspid Valve: The tricuspid valve appears unchanged from pre-bypass.  _ Pulmonic Valve: The pulmonic valve appears unchanged from pre-bypass.  _ Interatrial Septum: The interatrial septum appears unchanged from  pre-bypass.  _ Interventricular Septum: The interventricular septum appears unchanged  from  pre-bypass.  _ Pericardium: The pericardium appears unchanged from pre-bypass.   PRE-OP FINDINGS   Left Ventricle: The left ventricle has normal systolic function, with an  ejection fraction of 60-65%. The cavity size was normal. There is mildly  increased left ventricular wall thickness. There is mild concentric left  ventricular hypertrophy.   Right Ventricle: The right ventricle has normal systolic function. The  cavity was normal. There is no increase in right ventricular wall  thickness.   Left Atrium: Left atrial size was normal in size. No left atrial/left  atrial appendage thrombus was detected.   Right Atrium: Right atrial size was normal in size.   Interatrial Septum: No atrial level shunt detected by color flow Doppler.   Pericardium: Trivial pericardial effusion is present.   Mitral Valve: The mitral valve is normal in structure. Mitral valve  regurgitation is trivial by color flow Doppler. There is no evidence of  mitral stenosis.   Tricuspid Valve: The tricuspid valve was normal in structure. Tricuspid  valve regurgitation is trivial by color flow Doppler. No evidence of  tricuspid stenosis is present.   Aortic Valve: The aortic valve is tricuspid. Aortic valve regurgitation  was not visualized by color flow Doppler. There is no stenosis of the  aortic valve.   Pulmonic Valve: The pulmonic valve was  normal in structure.  Pulmonic valve regurgitation is not visualized by color flow Doppler.    Aorta: The aortic root, ascending aorta and aortic arch are normal in size  and structure. There is evidence of plaque in the descending aorta.   +--------------+-------++  LEFT VENTRICLE         +--------------+-------++  PLAX 2D                +--------------+-------++  LVIDd:       4.80 cm  +--------------+-------++  LVIDs:       3.10 cm  +--------------+-------++  LV SV:        70 ml    +--------------+-------++  LV SV Index:  36.79    +--------------+-------++                        +--------------+-------++   +-------------+-----------++  AORTIC VALVE              +-------------+-----------++  AV Vmax:     115.00 cm/s  +-------------+-----------++  AV Vmean:    80.300 cm/s  +-------------+-----------++  AV VTI:      0.299 m      +-------------+-----------++  AV Peak Grad:5.3 mmHg     +-------------+-----------++  AV Mean Grad:3.0 mmHg     +-------------+-----------++    +------------+-------++  AORTA               +------------+-------++  Ao Asc diam:3.30 cm  +------------+-------++     Karna Christmas MD  Electronically signed by Karna Christmas MD  Signature Date/Time: 12/10/2023/3:12:06 PM      Final     Treatments: surgery:  CARDIOVASCULAR SURGERY OPERATIVE NOTE   12/10/2023   Surgeon:  [name], MD   First Assistant: Aloha Gell,  PA-C:   An experienced assistant was required given the complexity of this surgery and the standard of surgical care. The assistant was needed for endoscopic vein harvest, exposure, dissection, suctioning, retraction of delicate tissues and sutures, instrument exchange and for overall help during this procedure.   Preoperative Diagnosis:  Severe multi-vessel coronary artery disease   Postoperative Diagnosis:  Same   Procedure: Median Sternotomy Extracorporeal  circulation 3.   Coronary artery bypass grafting x 3 Left internal mammary artery graft to the LAD SVG to diagonal SVG to OM 4.   Endoscopic vein harvest from the right leg  Discharge Exam: Blood pressure 121/66, pulse 65, temperature 98 F (36.7 C), temperature source Oral, resp. rate 15, height 5\' 8"  (1.727 m), weight 73.4 kg, SpO2 94%. General appearance: alert, cooperative, and no distress Neurologic: intact Heart: regular rate and rhythm, S1, S2 normal, no murmur, click, rub or gallop Lungs: clear to auscultation bilaterally Abdomen: soft, non-tender; bowel sounds normal; no masses,  no organomegaly Extremities: trace edema of the RLE, no edema of the LLE Wound: Clean and dry without sign of infection  Discharge Medications:  The patient has been discharged on:   1.Beta Blocker:  Yes [  X ]                              No   [   ]                              If No, reason:  2.Ace Inhibitor/ARB: Yes [  X ]                                     No  [    ]                                     If No, reason:  3.Statin:   Yes [   ]                  No  [  X ]                  If No, reason: statin intolerant  4.Marlowe KaysValentino Hue  [  Juliann Pares ]  No   [   ]                  If No, reason:  Patient had ACS upon admission: No  Plavix/P2Y12 inhibitor: Yes [   ]                                      No  [ X ]     Discharge Instructions     Amb Referral to Cardiac Rehabilitation   Complete by: As directed    Diagnosis: CABG   CABG X ___: 3   After initial evaluation and assessments completed: Virtual Based Care may be provided alone or in conjunction with Phase 2 Cardiac Rehab based on patient barriers.: Yes   Intensive Cardiac Rehabilitation (ICR) [facility] location only OR Traditional Cardiac Rehabilitation (TCR) *If criteria for ICR are not met will enroll in TCR [facility] only): Yes      Allergies as of 12/16/2023       Reactions   Metformin Other (See Comments)   Stomach  problems   Rosuvastatin Other (See Comments)   fatigue   Tape Rash        Medication List     STOP taking these medications    diazepam 2 MG tablet Commonly known as: VALIUM   traMADol 50 MG tablet Commonly known as: ULTRAM       TAKE these medications    acetaminophen 325 MG tablet Commonly known as: Tylenol Take 2 tablets (650 mg total) by mouth every 6 (six) hours as needed for mild pain (pain score 1-3).   amiodarone 200 MG tablet Commonly known as: PACERONE Take 2 tablets by mouth twice per day for 7 days, then take 1 tablet twice per day for 7 days, then take 1 tablet daily thereafter   amLODipine 10 MG tablet Commonly known as: NORVASC Take 1 tablet (10 mg total) by mouth daily. What changed:  medication strength how much to take when to take this   aspirin EC 325 MG tablet Take 1 tablet (325 mg total) by mouth daily. What changed:  medication strength how much to take when to take this   lisinopril 5 MG tablet Commonly known as: ZESTRIL Take 1 tablet (5 mg total) by mouth daily.   metoprolol tartrate 50 MG tablet Commonly known as: LOPRESSOR Take 1 tablet (50 mg total) by mouth 2 (two) times daily. What changed:  medication strength how much to take   OMEGA 3 500 PO Take 500 mg by mouth daily.   oxyCODONE 5 MG immediate release tablet Commonly known as: Oxy IR/ROXICODONE Take 1 tablet (5 mg total) by mouth every 6 (six) hours as needed for severe pain (pain score 7-10).   PreviDent 5000 Sensitive 1.1-5 % Gel Generic drug: Sod Fluoride-Potassium Nitrate Place 1 Application onto teeth 2 (two) times daily.               Durable Medical Equipment  (From admission, onward)           Start     Ordered   12/15/23 0736  For home use only DME Walker rolling  Once       Question Answer Comment  Walker: With 5 Inch Wheels   Patient needs a walker to treat with the following condition Physical deconditioning   Patient needs a walker  to treat with the following condition S/P  CABG (coronary artery bypass graft)      12/15/23 0736            Follow-up Information     [name], MD Follow up on 01/13/2024.   Specialty: Cardiothoracic Surgery Why: Follow up appointment is at 9:30AM. Please get a chest xray at 8:30AM, 1 hour prior to your appointment.  THIS WILL BE LOCATED IN OUR NEW BUILDING: [address] in [place], [place], [address]. Contact information: [address] [place] [address] [phone]         Marcina Millard, MD Follow up on 12/28/2023.   Specialty: Cardiology Why: Cardiology follow up is at 3:30PM Contact information: [address] [facility] [place] [address] [phone]         [facility] Follow up on 12/21/2023.   Specialty: Cardiothoracic Surgery Why: Nurse visit for suture removal is at 10:00AM Contact information: [address] [place], [address] [place] [address] [phone]        [facility] Follow up.   Why: ([employer])- rolling walker arranged- to be delivered to room prior to discharge Contact information: [address] [place] [address] [phone]                 Signed:  Jenny Reichmann, PA-C  12/16/2023, 10:19 AM

## 2023-12-11 NOTE — Plan of Care (Signed)
 ?  Problem: Clinical Measurements: ?Goal: Ability to maintain clinical measurements within normal limits will improve ?Outcome: Progressing ?Goal: Will remain free from infection ?Outcome: Progressing ?Goal: Diagnostic test results will improve ?Outcome: Progressing ?  ?

## 2023-12-11 NOTE — Progress Notes (Signed)
 1 Day Post-Op Procedure(s) (LRB): CORONARY ARTERY BYPASS GRAFTING (CABG) xTHREE USING LEFT INTERNAL MAMMARY ARTERY AND ENDOSCOPICALLY HARVESTED RIGHT GREATER SAPPHENOUS VEIN (N/A) ECHOCARDIOGRAM, TRANSESOPHAGEAL, INTRAOPERATIVE (N/A) Subjective:  Some chest wall discomfort from tubes but otherwise ok  Objective: Vital signs in last 24 hours: Temp:  [95 F (35 C)-99.9 F (37.7 C)] 99 F (37.2 C) (03/28 0645) Pulse Rate:  [63-97] 66 (03/28 0645) Cardiac Rhythm: Normal sinus rhythm (03/27 2100) Resp:  [0-33] 14 (03/28 0645) BP: (97-122)/(52-64) 122/59 (03/28 0600) SpO2:  [89 %-100 %] 91 % (03/28 0645) Arterial Line BP: (72-168)/(39-88) 158/49 (03/28 0645) FiO2 (%):  [40 %-50 %] 40 % (03/27 1640) Weight:  [80.5 kg] 80.5 kg (03/28 0530)  Hemodynamic parameters for last 24 hours: PAP: (18-44)/(6-25) 33/13 CO:  [3.8 L/min-6.4 L/min] 3.9 L/min CI:  [2.05 L/min/m2-3.4 L/min/m2] 2.1 L/min/m2  Intake/Output from previous day: 03/27 0701 - 03/28 0700 In: 6581.9 [I.V.:3754.9; Blood:220; IV Piggyback:2607] Out: 3905 [Urine:2725; Blood:450; Chest Tube:730] Intake/Output this shift: Total I/O In: 1510.3 [I.V.:1139.4; IV Piggyback:370.8] Out: 775 [Urine:515; Chest Tube:260]  General appearance: alert and cooperative Neurologic: intact Heart: regular rate and rhythm Lungs: clear to auscultation bilaterally Extremities: edema moderate Wound: dressings dry  Lab Results: Recent Labs    12/10/23 1852 12/11/23 0407  WBC 9.5 9.2  HGB 9.2* 9.1*  HCT 26.4* 26.0*  PLT 120* 131*   BMET:  Recent Labs    12/10/23 1852 12/11/23 0407  NA 137 137  K 3.6 3.9  CL 108 111  CO2 21* 23  GLUCOSE 151* 104*  BUN 14 14  CREATININE 0.92 0.91  CALCIUM 7.3* 7.5*    PT/INR:  Recent Labs    12/10/23 1251  LABPROT 18.0*  INR 1.5*   ABG    Component Value Date/Time   PHART 7.317 (L) 12/10/2023 1804   HCO3 22.0 12/10/2023 1804   TCO2 23 12/10/2023 1804   ACIDBASEDEF 4.0 (H) 12/10/2023  1804   O2SAT 93 12/10/2023 1804   CBG (last 3)  Recent Labs    12/08/23 1254 12/10/23 0559  GLUCAP 94 80   CXR: mild bibasilar atelectasis  ECG: NSR 67, no acute changes  Assessment/Plan: S/P Procedure(s) (LRB): CORONARY ARTERY BYPASS GRAFTING (CABG) xTHREE USING LEFT INTERNAL MAMMARY ARTERY AND ENDOSCOPICALLY HARVESTED RIGHT GREATER SAPPHENOUS VEIN (N/A) ECHOCARDIOGRAM, TRANSESOPHAGEAL, INTRAOPERATIVE (N/A)  POD 1 Hemodynamically stable on Cardene for HTN. Continue low dose Lopressor and resume his previous Norvasc 5 bid so we can wean off Cardene. LVEF normal.  Start diuresis and KCL. Wt is 14.5 lbs over preop.  Glucose under good control with no hx of DM and normal Hgb A1c. DC insulin drip and transition to SSI.  DC swan, arterial line.  DC pacing wires this am and if stable for a few hrs can remove chest tubes.  IS, OOB, mobilize.   LOS: 1 day    [name] 12/11/2023

## 2023-12-12 ENCOUNTER — Inpatient Hospital Stay (HOSPITAL_COMMUNITY)

## 2023-12-12 LAB — POCT I-STAT 7, (LYTES, BLD GAS, ICA,H+H)
Acid-base deficit: 4 mmol/L — ABNORMAL HIGH (ref 0.0–2.0)
Bicarbonate: 20.9 mmol/L (ref 20.0–28.0)
Calcium, Ion: 1.12 mmol/L — ABNORMAL LOW (ref 1.15–1.40)
HCT: 24 % — ABNORMAL LOW (ref 39.0–52.0)
Hemoglobin: 8.2 g/dL — ABNORMAL LOW (ref 13.0–17.0)
O2 Saturation: 91 %
Patient temperature: 37.2
Potassium: 4.3 mmol/L (ref 3.5–5.1)
Sodium: 139 mmol/L (ref 135–145)
TCO2: 22 mmol/L (ref 22–32)
pCO2 arterial: 35.8 mmHg (ref 32–48)
pH, Arterial: 7.376 (ref 7.35–7.45)
pO2, Arterial: 61 mmHg — ABNORMAL LOW (ref 83–108)

## 2023-12-12 LAB — BASIC METABOLIC PANEL WITH GFR
Anion gap: 7 (ref 5–15)
Anion gap: 8 (ref 5–15)
BUN: 22 mg/dL (ref 8–23)
BUN: 23 mg/dL (ref 8–23)
CO2: 21 mmol/L — ABNORMAL LOW (ref 22–32)
CO2: 26 mmol/L (ref 22–32)
Calcium: 7.8 mg/dL — ABNORMAL LOW (ref 8.9–10.3)
Calcium: 8.2 mg/dL — ABNORMAL LOW (ref 8.9–10.3)
Chloride: 109 mmol/L (ref 98–111)
Chloride: 101 mmol/L (ref 98–111)
Creatinine, Ser: 1.16 mg/dL (ref 0.61–1.24)
Creatinine, Ser: 1.15 mg/dL (ref 0.61–1.24)
GFR, Estimated: >60 mL/min (ref >60)
GFR, Estimated: >60 mL/min (ref >60)
Glucose, Bld: 171 mg/dL — ABNORMAL HIGH (ref 70–99)
Glucose, Bld: 174 mg/dL — ABNORMAL HIGH (ref 70–99)
Potassium: 4.3 mmol/L (ref 3.5–5.1)
Potassium: 3.3 mmol/L — ABNORMAL LOW (ref 3.5–5.1)
Sodium: 137 mmol/L (ref 135–145)
Sodium: 135 mmol/L (ref 135–145)

## 2023-12-12 LAB — CBC
HCT: 25.5 % — ABNORMAL LOW (ref 39.0–52.0)
Hemoglobin: 8.7 g/dL — ABNORMAL LOW (ref 13.0–17.0)
MCH: 30.4 pg (ref 26.0–34.0)
MCHC: 34.1 g/dL (ref 30.0–36.0)
MCV: 89.2 fL (ref 80.0–100.0)
Platelets: 122 10*3/uL — ABNORMAL LOW (ref 150–400)
RBC: 2.86 MIL/uL — ABNORMAL LOW (ref 4.22–5.81)
RDW: 14.3 % (ref 11.5–15.5)
WBC: 10.7 10*3/uL — ABNORMAL HIGH (ref 4.0–10.5)
nRBC: 0 % (ref 0.0–0.2)

## 2023-12-12 LAB — GLUCOSE, CAPILLARY
Glucose-Capillary: 191 mg/dL — ABNORMAL HIGH (ref 70–99)
Glucose-Capillary: 161 mg/dL — ABNORMAL HIGH (ref 70–99)
Glucose-Capillary: 199 mg/dL — ABNORMAL HIGH (ref 70–99)
Glucose-Capillary: 93 mg/dL (ref 70–99)
Glucose-Capillary: 139 mg/dL — ABNORMAL HIGH (ref 70–99)
Glucose-Capillary: 145 mg/dL — ABNORMAL HIGH (ref 70–99)
Glucose-Capillary: 141 mg/dL — ABNORMAL HIGH (ref 70–99)

## 2023-12-12 MED ORDER — POTASSIUM CHLORIDE CRYS ER 20 MEQ PO TBCR
40.0000 meq | EXTENDED_RELEASE_TABLET | Freq: Two times a day (BID) | ORAL | Status: AC
  Administered 2023-12-12: 40 meq via ORAL
  Filled 2023-12-12: qty 2

## 2023-12-12 MED ORDER — AMIODARONE HCL IN DEXTROSE 360-4.14 MG/200ML-% IV SOLN
30.0000 mg/h | INTRAVENOUS | Status: DC
  Administered 2023-12-13 (×3): 30 mg/h via INTRAVENOUS
  Filled 2023-12-12: qty 200

## 2023-12-12 MED ORDER — POTASSIUM CHLORIDE CRYS ER 20 MEQ PO TBCR
20.0000 meq | EXTENDED_RELEASE_TABLET | ORAL | Status: AC
  Administered 2023-12-12 – 2023-12-13 (×3): 20 meq via ORAL
  Filled 2023-12-12 (×3): qty 1

## 2023-12-12 MED ORDER — AMLODIPINE BESYLATE 10 MG PO TABS
10.0000 mg | ORAL_TABLET | Freq: Every day | ORAL | Status: DC
  Administered 2023-12-12 – 2023-12-16 (×5): 10 mg via ORAL
  Filled 2023-12-12 (×5): qty 1

## 2023-12-12 MED ORDER — AMIODARONE LOAD VIA INFUSION
150.0000 mg | Freq: Once | INTRAVENOUS | Status: AC
  Administered 2023-12-12: 150 mg via INTRAVENOUS
  Filled 2023-12-12: qty 83.34

## 2023-12-12 MED ORDER — METOLAZONE 5 MG PO TABS
5.0000 mg | ORAL_TABLET | Freq: Once | ORAL | Status: AC
  Administered 2023-12-12: 5 mg via ORAL
  Filled 2023-12-12: qty 1

## 2023-12-12 MED ORDER — AMIODARONE HCL IN DEXTROSE 360-4.14 MG/200ML-% IV SOLN
60.0000 mg/h | INTRAVENOUS | Status: AC
  Administered 2023-12-12: 60 mg/h via INTRAVENOUS
  Filled 2023-12-12: qty 400

## 2023-12-12 MED ORDER — FUROSEMIDE 10 MG/ML IJ SOLN
40.0000 mg | Freq: Two times a day (BID) | INTRAMUSCULAR | Status: AC
  Administered 2023-12-12 (×2): 40 mg via INTRAVENOUS
  Filled 2023-12-12 (×2): qty 4

## 2023-12-12 MED ORDER — METOPROLOL TARTRATE 25 MG/10 ML ORAL SUSPENSION
25.0000 mg | Freq: Two times a day (BID) | ORAL | Status: DC
Start: 2023-12-12 — End: 2023-12-14

## 2023-12-12 MED ORDER — AMIODARONE HCL IN DEXTROSE 360-4.14 MG/200ML-% IV SOLN
INTRAVENOUS | Status: AC
Start: 2023-12-12 — End: 2023-12-12
  Filled 2023-12-12: qty 200

## 2023-12-12 MED ORDER — POTASSIUM CHLORIDE CRYS ER 20 MEQ PO TBCR
40.0000 meq | EXTENDED_RELEASE_TABLET | Freq: Two times a day (BID) | ORAL | Status: AC
  Administered 2023-12-12: 40 meq via ORAL

## 2023-12-12 MED ORDER — POTASSIUM CHLORIDE CRYS ER 20 MEQ PO TBCR
20.0000 meq | EXTENDED_RELEASE_TABLET | ORAL | Status: DC
  Filled 2023-12-12: qty 1

## 2023-12-12 MED ORDER — METOPROLOL TARTRATE 25 MG PO TABS
25.0000 mg | ORAL_TABLET | Freq: Two times a day (BID) | ORAL | Status: DC
  Administered 2023-12-12 – 2023-12-13 (×4): 25 mg via ORAL
  Filled 2023-12-12 (×4): qty 1

## 2023-12-12 NOTE — Progress Notes (Signed)
 2 Days Post-Op Procedure(s) (LRB): CORONARY ARTERY BYPASS GRAFTING (CABG) xTHREE USING LEFT INTERNAL MAMMARY ARTERY AND ENDOSCOPICALLY HARVESTED RIGHT GREATER SAPPHENOUS VEIN (N/A) ECHOCARDIOGRAM, TRANSESOPHAGEAL, INTRAOPERATIVE (N/A) Subjective:  Had a rough night. Sats high 80's on 15L NRB.  +1400 cc yesterday despite lasix due to Nicardipine drip at 150/hr. Wt up 3.5 lbs. 18 lbs over preop.  Sitting up in chair. Has been using IS at 1000 according to wife. Objective: Vital signs in last 24 hours: Temp:  [98.8 F (37.1 C)-99.7 F (37.6 C)] 99 F (37.2 C) (03/29 0445) Pulse Rate:  [59-86] 78 (03/29 0700) Cardiac Rhythm: Normal sinus rhythm (03/29 0400) Resp:  [12-32] 20 (03/29 0700) BP: (114-135)/(47-63) 130/51 (03/28 1700) SpO2:  [76 %-100 %] 91 % (03/29 0700) Arterial Line BP: (101-162)/(35-67) 140/40 (03/29 0700) Weight:  [82.2 kg] 82.2 kg (03/29 0500)  Hemodynamic parameters for last 24 hours: PAP: (29-34)/(12-17) 33/16 CO:  [4.7 L/min] 4.7 L/min CI:  [2.5 L/min/m2] 2.5 L/min/m2  Intake/Output from previous day: 03/28 0701 - 03/29 0700 In: 3198.3 [P.O.:100; I.V.:2843.4; IV Piggyback:254.9] Out: 1610 [RUEAV:4098; Chest Tube:80] Intake/Output this shift: No intake/output data recorded.  General appearance: alert and cooperative Neurologic: intact Heart: regular rate and rhythm Lungs: diminished breath sounds bibasilar Extremities: edema moderate Wound: dressing dry  Lab Results: Recent Labs    12/11/23 1639 12/12/23 0349 12/12/23 0356  WBC 11.1* 10.7*  --   HGB 9.3* 8.7* 8.2*  HCT 26.6* 25.5* 24.0*  PLT 135* 122*  --    BMET:  Recent Labs    12/11/23 1639 12/12/23 0349 12/12/23 0356  NA 138 137 139  K 3.7 4.3 4.3  CL 107 109  --   CO2 22 21*  --   GLUCOSE 173* 171*  --   BUN 17 22  --   CREATININE 1.16 1.16  --   CALCIUM 7.6* 7.8*  --     PT/INR:  Recent Labs    12/10/23 1251  LABPROT 18.0*  INR 1.5*   ABG    Component Value Date/Time    PHART 7.376 12/12/2023 0356   HCO3 20.9 12/12/2023 0356   TCO2 22 12/12/2023 0356   ACIDBASEDEF 4.0 (H) 12/12/2023 0356   O2SAT 91 12/12/2023 0356   CBG (last 3)  Recent Labs    12/11/23 1940 12/11/23 2320 12/12/23 0330  GLUCAP 191* 161* 161*   CXR: bibasilar atelectasis and low lung volumes.  Assessment/Plan: S/P Procedure(s) (LRB): CORONARY ARTERY BYPASS GRAFTING (CABG) xTHREE USING LEFT INTERNAL MAMMARY ARTERY AND ENDOSCOPICALLY HARVESTED RIGHT GREATER SAPPHENOUS VEIN (N/A) ECHOCARDIOGRAM, TRANSESOPHAGEAL, INTRAOPERATIVE (N/A)  POD 2 Hemodynamically stable in sinus rhythm. DC Nicardipine and change Norvasc to 10 daily. Increase Lopressor to 25 bid. I think SBP< 160 is fine for him at this point.  Continue diuresis. Will give him lasix 40 bid and metolazone 5. Monitor response this am. May need higher dose of Demedex. Normal renal function.  IS, OOB. Wean oxygen as tolerated.  Continue SSI for hyperglycemia. Preop Hgb A1c 5.6 on no meds.    LOS: 2 days    [name] 12/12/2023

## 2023-12-12 NOTE — Plan of Care (Signed)
  Problem: Education: Goal: Knowledge of General Education information will improve Description: Including pain rating scale, medication(s)/side effects and non-pharmacologic comfort measures Outcome: Progressing   Problem: Health Behavior/Discharge Planning: Goal: Ability to manage health-related needs will improve Outcome: Progressing   Problem: Clinical Measurements: Goal: Ability to maintain clinical measurements within normal limits will improve Outcome: Progressing Goal: Will remain free from infection Outcome: Progressing Goal: Diagnostic test results will improve Outcome: Progressing   Problem: Nutrition: Goal: Adequate nutrition will be maintained Outcome: Progressing   

## 2023-12-13 ENCOUNTER — Inpatient Hospital Stay (HOSPITAL_COMMUNITY)

## 2023-12-13 LAB — BASIC METABOLIC PANEL WITH GFR
Anion gap: 13 (ref 5–15)
Anion gap: 12 (ref 5–15)
BUN: 21 mg/dL (ref 8–23)
BUN: 25 mg/dL — ABNORMAL HIGH (ref 8–23)
CO2: 28 mmol/L (ref 22–32)
CO2: 27 mmol/L (ref 22–32)
Calcium: 8.4 mg/dL — ABNORMAL LOW (ref 8.9–10.3)
Calcium: 8.5 mg/dL — ABNORMAL LOW (ref 8.9–10.3)
Chloride: 98 mmol/L (ref 98–111)
Chloride: 100 mmol/L (ref 98–111)
Creatinine, Ser: 1.08 mg/dL (ref 0.61–1.24)
Creatinine, Ser: 1.1 mg/dL (ref 0.61–1.24)
GFR, Estimated: >60 mL/min (ref >60)
GFR, Estimated: >60 mL/min (ref >60)
Glucose, Bld: 145 mg/dL — ABNORMAL HIGH (ref 70–99)
Glucose, Bld: 153 mg/dL — ABNORMAL HIGH (ref 70–99)
Potassium: 3.1 mmol/L — ABNORMAL LOW (ref 3.5–5.1)
Potassium: 3.7 mmol/L (ref 3.5–5.1)
Sodium: 139 mmol/L (ref 135–145)
Sodium: 139 mmol/L (ref 135–145)

## 2023-12-13 LAB — GLUCOSE, CAPILLARY
Glucose-Capillary: 131 mg/dL — ABNORMAL HIGH (ref 70–99)
Glucose-Capillary: 138 mg/dL — ABNORMAL HIGH (ref 70–99)
Glucose-Capillary: 124 mg/dL — ABNORMAL HIGH (ref 70–99)
Glucose-Capillary: 111 mg/dL — ABNORMAL HIGH (ref 70–99)
Glucose-Capillary: 115 mg/dL — ABNORMAL HIGH (ref 70–99)

## 2023-12-13 LAB — CBC
HCT: 26.3 % — ABNORMAL LOW (ref 39.0–52.0)
Hemoglobin: 9.1 g/dL — ABNORMAL LOW (ref 13.0–17.0)
MCH: 30.5 pg (ref 26.0–34.0)
MCHC: 34.6 g/dL (ref 30.0–36.0)
MCV: 88.3 fL (ref 80.0–100.0)
Platelets: 131 10*3/uL — ABNORMAL LOW (ref 150–400)
RBC: 2.98 MIL/uL — ABNORMAL LOW (ref 4.22–5.81)
RDW: 14.3 % (ref 11.5–15.5)
WBC: 9.6 10*3/uL (ref 4.0–10.5)
nRBC: 0 % (ref 0.0–0.2)

## 2023-12-13 LAB — MAGNESIUM
Magnesium: 2.1 mg/dL (ref 1.7–2.4)
Magnesium: 2.1 mg/dL (ref 1.7–2.4)

## 2023-12-13 MED ORDER — POTASSIUM CHLORIDE CRYS ER 20 MEQ PO TBCR
40.0000 meq | EXTENDED_RELEASE_TABLET | ORAL | Status: AC
  Administered 2023-12-13 (×2): 40 meq via ORAL
  Filled 2023-12-13 (×2): qty 2

## 2023-12-13 MED ORDER — AMIODARONE LOAD VIA INFUSION
150.0000 mg | Freq: Once | INTRAVENOUS | Status: AC
  Filled 2023-12-13: qty 83.34

## 2023-12-13 MED ORDER — AYR SALINE NASAL NA GEL
1.0000 | Freq: Two times a day (BID) | NASAL | Status: DC | PRN
  Filled 2023-12-13: qty 14.1

## 2023-12-13 MED ORDER — INSULIN ASPART 100 UNIT/ML IJ SOLN
0.0000 [IU] | Freq: Three times a day (TID) | INTRAMUSCULAR | Status: DC
  Administered 2023-12-13: 2 [IU] via SUBCUTANEOUS

## 2023-12-13 MED ORDER — POTASSIUM CHLORIDE CRYS ER 20 MEQ PO TBCR
20.0000 meq | EXTENDED_RELEASE_TABLET | ORAL | Status: DC
  Administered 2023-12-13: 20 meq via ORAL
  Filled 2023-12-13: qty 1

## 2023-12-13 NOTE — Progress Notes (Signed)
 EVENING ROUNDS NOTE :     [address]       [place] [address]             [phone]                 3 Days Post-Op Procedure(s) (LRB): CORONARY ARTERY BYPASS GRAFTING (CABG) xTHREE USING LEFT INTERNAL MAMMARY ARTERY AND ENDOSCOPICALLY HARVESTED RIGHT GREATER SAPPHENOUS VEIN (N/A) ECHOCARDIOGRAM, TRANSESOPHAGEAL, INTRAOPERATIVE (N/A)   Total Length of Stay:  LOS: 3 days  Events:   Back in sinus On room air Stable day    BP (!) 112/101   Pulse 75   Temp 98.3 F (36.8 C) (Oral)   Resp 18   Ht 5\' 8"  (1.727 m)   Wt 75.4 kg   SpO2 97%   BMI 25.27 kg/m          amiodarone 30 mg/hr (12/13/23 1800)    I/O last 3 completed shifts: In: 746.3 [I.V.:701.1; IV Piggyback:45.2] Out: 7115 [Urine:7115]      Latest Ref Rng & Units 12/13/2023    3:50 AM 12/12/2023    3:56 AM 12/12/2023    3:49 AM  CBC  WBC 4.0 - 10.5 K/uL 9.6   10.7   Hemoglobin 13.0 - 17.0 g/dL 9.1  8.2  8.7   Hematocrit 39.0 - 52.0 % 26.3  24.0  25.5   Platelets 150 - 400 K/uL 131   122        Latest Ref Rng & Units 12/13/2023    3:50 AM 12/12/2023    4:44 PM 12/12/2023    3:56 AM  BMP  Glucose 70 - 99 mg/dL 213  086    BUN 8 - 23 mg/dL 21  23    Creatinine 5.78 - 1.24 mg/dL 4.69  6.29    Sodium 528 - 145 mmol/L 139  135  139   Potassium 3.5 - 5.1 mmol/L 3.1  3.3  4.3   Chloride 98 - 111 mmol/L 98  101    CO2 22 - 32 mmol/L 28  26    Calcium 8.9 - 10.3 mg/dL 8.4  8.2      ABG    Component Value Date/Time   PHART 7.376 12/12/2023 0356   PCO2ART 35.8 12/12/2023 0356   PO2ART 61 (L) 12/12/2023 0356   HCO3 20.9 12/12/2023 0356   TCO2 22 12/12/2023 0356   ACIDBASEDEF 4.0 (H) 12/12/2023 0356   O2SAT 91 12/12/2023 0356       Brynda Greathouse, MD 12/13/2023 7:01 PM

## 2023-12-13 NOTE — Progress Notes (Signed)
 3 Days Post-Op Procedure(s) (LRB): CORONARY ARTERY BYPASS GRAFTING (CABG) xTHREE USING LEFT INTERNAL MAMMARY ARTERY AND ENDOSCOPICALLY HARVESTED RIGHT GREATER SAPPHENOUS VEIN (N/A) ECHOCARDIOGRAM, TRANSESOPHAGEAL, INTRAOPERATIVE (N/A) Subjective:  Feels better today but had some bad dreams and tried to get out of bed himself.  Went into atrial fib with RVR overnight and started on amio. Rate still 120's.  Bowels working.  Objective: Vital signs in last 24 hours: Temp:  [97.9 F (36.6 C)-99.9 F (37.7 C)] 99.9 F (37.7 C) (03/29 2000) Pulse Rate:  [70-126] 115 (03/30 0800) Cardiac Rhythm: Atrial fibrillation (03/30 0800) Resp:  [13-25] 15 (03/30 0800) BP: (109-144)/(55-102) 109/76 (03/30 0800) SpO2:  [85 %-99 %] 96 % (03/30 0800) Arterial Line BP: (140-155)/(45-51) 151/51 (03/29 1115) Weight:  [75.4 kg] 75.4 kg (03/30 0625)  Hemodynamic parameters for last 24 hours:    Intake/Output from previous day: 03/29 0701 - 03/30 0700 In: 482.7 [I.V.:437.5; IV Piggyback:45.2] Out: 6700 [Urine:6700] Intake/Output this shift: Total I/O In: 16.7 [I.V.:16.7] Out: 125 [Urine:125]  General appearance: alert and cooperative Neurologic: intact Heart: irregularly irregular rhythm Lungs: diminished breath sounds bibasilar Extremities: no edema Wound: incision ok  Lab Results: Recent Labs    12/12/23 0349 12/12/23 0356 12/13/23 0350  WBC 10.7*  --  9.6  HGB 8.7* 8.2* 9.1*  HCT 25.5* 24.0* 26.3*  PLT 122*  --  131*   BMET:  Recent Labs    12/12/23 1644 12/13/23 0350  NA 135 139  K 3.3* 3.1*  CL 101 98  CO2 26 28  GLUCOSE 174* 145*  BUN 23 21  CREATININE 1.15 1.08  CALCIUM 8.2* 8.4*    PT/INR:  Recent Labs    12/10/23 1251  LABPROT 18.0*  INR 1.5*   ABG    Component Value Date/Time   PHART 7.376 12/12/2023 0356   HCO3 20.9 12/12/2023 0356   TCO2 22 12/12/2023 0356   ACIDBASEDEF 4.0 (H) 12/12/2023 0356   O2SAT 91 12/12/2023 0356   CBG (last 3)  Recent Labs     12/12/23 2321 12/13/23 0332 12/13/23 0748  GLUCAP 141* 131* 138*   CXR: stable right basilar atelectasis.  Assessment/Plan: S/P Procedure(s) (LRB): CORONARY ARTERY BYPASS GRAFTING (CABG) xTHREE USING LEFT INTERNAL MAMMARY ARTERY AND ENDOSCOPICALLY HARVESTED RIGHT GREATER SAPPHENOUS VEIN (N/A) ECHOCARDIOGRAM, TRANSESOPHAGEAL, INTRAOPERATIVE (N/A)  POD 2  Hemodynamically stable on Norvasc and Lopressor.  Postop atrial fib with RVR on IV amio. Will give him another bolus this am.  Diruesed -6L yesterday and now only 3 lbs over preop. Hold off on further diuresis since UO still good.  Hypokalemia: replacing.  Continue IS, ambulation.    LOS: 3 days    [name] 12/13/2023

## 2023-12-14 ENCOUNTER — Encounter: Admitting: Surgery

## 2023-12-14 LAB — GLUCOSE, CAPILLARY: Glucose-Capillary: 108 mg/dL — ABNORMAL HIGH (ref 70–99)

## 2023-12-14 MED ORDER — ASPIRIN 325 MG PO TBEC
325.0000 mg | DELAYED_RELEASE_TABLET | Freq: Every day | ORAL | Status: DC
  Administered 2023-12-14 – 2023-12-16 (×3): 325 mg via ORAL
  Filled 2023-12-14 (×3): qty 1

## 2023-12-14 MED ORDER — SODIUM CHLORIDE 0.9% FLUSH: 3.0000 mL | INTRAVENOUS | Status: DC | PRN

## 2023-12-14 MED ORDER — POTASSIUM CHLORIDE CRYS ER 20 MEQ PO TBCR
20.0000 meq | EXTENDED_RELEASE_TABLET | Freq: Three times a day (TID) | ORAL | Status: AC
  Administered 2023-12-14 (×3): 20 meq via ORAL
  Filled 2023-12-14 (×3): qty 1

## 2023-12-14 MED ORDER — ~~LOC~~ CARDIAC SURGERY, PATIENT & FAMILY EDUCATION: Freq: Once | Status: AC

## 2023-12-14 MED ORDER — SODIUM CHLORIDE 0.9% FLUSH
3.0000 mL | Freq: Two times a day (BID) | INTRAVENOUS | Status: DC
  Administered 2023-12-14 – 2023-12-15 (×3): 3 mL via INTRAVENOUS

## 2023-12-14 MED ORDER — ENOXAPARIN SODIUM 40 MG/0.4ML IJ SOSY
40.0000 mg | PREFILLED_SYRINGE | INTRAMUSCULAR | Status: DC
  Administered 2023-12-14 – 2023-12-16 (×3): 40 mg via SUBCUTANEOUS
  Filled 2023-12-14 (×3): qty 0.4

## 2023-12-14 MED ORDER — METOPROLOL TARTRATE 25 MG/10 ML ORAL SUSPENSION: 50.0000 mg | Freq: Two times a day (BID) | ORAL | Status: DC

## 2023-12-14 MED ORDER — SODIUM CHLORIDE 0.9 % IV SOLN: 250.0000 mL | INTRAVENOUS | Status: AC | PRN

## 2023-12-14 MED ORDER — METOPROLOL TARTRATE 50 MG PO TABS: 50.0000 mg | ORAL_TABLET | Freq: Two times a day (BID) | ORAL | Status: DC

## 2023-12-14 MED ORDER — METOPROLOL TARTRATE 50 MG PO TABS
50.0000 mg | ORAL_TABLET | Freq: Two times a day (BID) | ORAL | Status: DC
  Administered 2023-12-14 – 2023-12-16 (×5): 50 mg via ORAL
  Filled 2023-12-14 (×5): qty 1

## 2023-12-14 MED ORDER — AMIODARONE HCL 200 MG PO TABS
400.0000 mg | ORAL_TABLET | Freq: Two times a day (BID) | ORAL | Status: DC
  Administered 2023-12-14 – 2023-12-16 (×5): 400 mg via ORAL
  Filled 2023-12-14 (×5): qty 2

## 2023-12-14 NOTE — TOC Initial Note (Signed)
 Transition of Care [facility]) - Initial/Assessment Note    Patient Details  Name: Marcus Huang MRN: [id number] Date of Birth: [birth year]  [facility]) CM/SW Contact:    Gala Lewandowsky, RN Phone Number: 12/14/2023, 3:07 PM  Clinical Narrative: Patient was discussed in progression rounds-POD-4 CABG. PTA patient was from home with spouse. Case Manager spoke with spouse and spouse thinks patient will benefit from a rolling walker at home. Staff RN has ambulated the patient. Case Manager did ask Staff RN if she could place an order for PT/OT for DME recommendations. Case Manager will continue to follow for transition of care needs as the patient progresses.                   Expected Discharge Plan: Home w Home Health Services Barriers to Discharge: Continued Medical Work up   Patient Goals and CMS Choice Patient states their goals for this hospitalization and ongoing recovery are:: plan to return home once stable.  Expected Discharge Plan and Services   Discharge Planning Services: CM Consult   Living arrangements for the past 2 months: Single Family Home  Prior Living Arrangements/Services Living arrangements for the past 2 months: Single Family Home Lives with:: Spouse Patient language and need for interpreter reviewed:: Yes        Need for Family Participation in Patient Care: Yes (Comment) Care giver support system in place?: Yes (comment)   Criminal Activity/Legal Involvement Pertinent to Current Situation/Hospitalization: No - Comment as needed  Activities of Daily Living   ADL Screening (condition at time of admission) Independently performs ADLs?: Yes (appropriate for developmental age) Is the patient deaf or have difficulty hearing?: No Does the patient have difficulty seeing, even when wearing glasses/contacts?: No Does the patient have difficulty concentrating, remembering, or making decisions?: No  Permission Sought/Granted Permission sought to  share information with : Case Manager, Family Supports   Emotional Assessment Appearance:: Appears stated age Attitude/Demeanor/Rapport: Engaged Affect (typically observed): Appropriate   Alcohol / Substance Use: Not Applicable Psych Involvement: No (comment)  Admission diagnosis:  Coronary artery disease involving native coronary artery of native heart without angina pectoris [I25.10] S/P CABG x 3 [Z95.1] Patient Active Problem List   Diagnosis Date Noted   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/12/2022   Lymphedema 08/24/2022   Raynaud's disease 08/24/2022   PAD (peripheral artery disease) (HCC) 08/24/2022   S/P drug eluting coronary stent placement 08/15/2022   CAD (coronary artery disease) 08/05/2022   Screen for colon cancer 03/14/2020   Special screening for malignant neoplasms, colon    Peyronie's disease 07/16/2019   Renal mass 07/10/2019   Heart palpitations 06/27/2019   Carotid artery stenosis 06/02/2019   Umbilical hernia without obstruction and without gangrene 06/02/2019   Aortic atherosclerosis (HCC) 06/02/2019   Hypertensive urgency 05/31/2019   Non-recurrent bilateral inguinal hernia without obstruction or gangrene    Diabetes type 2, uncontrolled 02/14/2014   Hyperlipidemia, unspecified 02/14/2014   Malignant hypertension 02/14/2014   Type 2 diabetes mellitus without complication, without long-term current use of insulin (HCC) 02/14/2014   Hyperlipidemia 02/14/2014   Hypertension, essential, benign 02/14/2014   PCP:  Marina Goodell, MD Pharmacy:   [facility] [phone] [place], [place] - [address] AT [facility] [address] [place] [address] Phone: [phone] Fax: [phone]  Social Drivers of Health (SDOH) Social  History: SDOH Screenings   Food Insecurity: No Food Insecurity (12/11/2023)  Housing: Low Risk  (12/11/2023)  Transportation  Needs: No Transportation Needs (12/11/2023)  Utilities: Not At Risk (12/11/2023)  Depression (PHQ2-9): Low Risk  (11/06/2022)  Recent Concern: Depression (PHQ2-9) - Medium Risk (10/09/2022)  Financial Resource Strain: Low Risk  (05/17/2023)   Received from [facility]  Social Connections: Socially Integrated (12/11/2023)  Tobacco Use: Low Risk  (12/10/2023)   Readmission Risk Interventions     No data to display

## 2023-12-14 NOTE — Progress Notes (Signed)
 4 Days Post-Op Procedure(s) (LRB): CORONARY ARTERY BYPASS GRAFTING (CABG) xTHREE USING LEFT INTERNAL MAMMARY ARTERY AND ENDOSCOPICALLY HARVESTED RIGHT GREATER SAPPHENOUS VEIN (N/A) ECHOCARDIOGRAM, TRANSESOPHAGEAL, INTRAOPERATIVE (N/A) Subjective: Feels well overall. Breathing much better. Only complaint is dry nose from oxygen.   Objective: Vital signs in last 24 hours: Temp:  [97.7 F (36.5 C)-99.7 F (37.6 C)] 99.7 F (37.6 C) (03/31 0400) Pulse Rate:  [62-132] 82 (03/31 0600) Cardiac Rhythm: Normal sinus rhythm (03/30 2000) Resp:  [12-26] 17 (03/31 0600) BP: (109-169)/(59-131) 163/69 (03/31 0600) SpO2:  [85 %-100 %] 93 % (03/31 0600) Weight:  [74.6 kg] 74.6 kg (03/31 0500)  Hemodynamic parameters for last 24 hours:    Intake/Output from previous day: 03/30 0701 - 03/31 0700 In: 463.2 [I.V.:463.2] Out: 2215 [Urine:2215] Intake/Output this shift: Total I/O In: 182.9 [I.V.:182.9] Out: 1800 [Urine:1800]  General appearance: alert and cooperative Neurologic: intact Heart: regular rate and rhythm Lungs: clear to auscultation bilaterally Extremities: no edema Wound: incision healing well  Lab Results: Recent Labs    12/12/23 0349 12/12/23 0356 12/13/23 0350  WBC 10.7*  --  9.6  HGB 8.7* 8.2* 9.1*  HCT 25.5* 24.0* 26.3*  PLT 122*  --  131*   BMET:  Recent Labs    12/13/23 0350 12/13/23 2113  NA 139 139  K 3.1* 3.7  CL 98 100  CO2 28 27  GLUCOSE 145* 153*  BUN 21 25*  CREATININE 1.08 1.10  CALCIUM 8.4* 8.5*    PT/INR: No results for input(s): "LABPROT", "INR" in the last 72 hours. ABG    Component Value Date/Time   PHART 7.376 12/12/2023 0356   HCO3 20.9 12/12/2023 0356   TCO2 22 12/12/2023 0356   ACIDBASEDEF 4.0 (H) 12/12/2023 0356   O2SAT 91 12/12/2023 0356   CBG (last 3)  Recent Labs    12/13/23 1601 12/13/23 2051 12/14/23 0616  GLUCAP 111* 115* 108*    Assessment/Plan: S/P Procedure(s) (LRB): CORONARY ARTERY BYPASS GRAFTING (CABG)  xTHREE USING LEFT INTERNAL MAMMARY ARTERY AND ENDOSCOPICALLY HARVESTED RIGHT GREATER SAPPHENOUS VEIN (N/A) ECHOCARDIOGRAM, TRANSESOPHAGEAL, INTRAOPERATIVE (N/A)  POD 4  Hemodynamically stable in sinus rhythm 90's on Lopressor 25 bid and amio drip. Still tends to HTN so will increase Lopressor to 50 bid. Switch amio to po. Continue Norvasc 10.  -1750 cc yesterday. Wt is 1.5 lbs over preop. Hold off on further diuresis for now since UO still good. Replace K+  DC SSI and CBG's  DC sleeve.  Transfer to 4E and continue IS, ambulation.  Anticipate home in 1-2 days if maintaining sinus on oral amio.   LOS: 4 days    Marcus Huang 12/14/2023

## 2023-12-14 NOTE — Progress Notes (Addendum)
 Pt transferred to 2H, VSS upon arrival, denies pain. Ambulated without difficulty or distress. Pt on central monitoring. Family at bedside. Wiped with CHG upon arrival.

## 2023-12-15 LAB — BASIC METABOLIC PANEL WITH GFR
Anion gap: 16 — ABNORMAL HIGH (ref 5–15)
BUN: 18 mg/dL (ref 8–23)
CO2: 23 mmol/L (ref 22–32)
Calcium: 9 mg/dL (ref 8.9–10.3)
Chloride: 100 mmol/L (ref 98–111)
Creatinine, Ser: 1.01 mg/dL (ref 0.61–1.24)
GFR, Estimated: >60 mL/min (ref >60)
Glucose, Bld: 135 mg/dL — ABNORMAL HIGH (ref 70–99)
Potassium: 4 mmol/L (ref 3.5–5.1)
Sodium: 139 mmol/L (ref 135–145)

## 2023-12-15 MED ORDER — LISINOPRIL 5 MG PO TABS
5.0000 mg | ORAL_TABLET | Freq: Every day | ORAL | Status: DC
  Administered 2023-12-15 – 2023-12-16 (×2): 5 mg via ORAL
  Filled 2023-12-15 (×2): qty 1

## 2023-12-15 NOTE — TOC Progression Note (Signed)
 Transition of Care (TOC) - Progression Note  Sander Radon, BSN Transitions of Care Unit 4E- RN Case Manager See Treatment Team for direct phone #   Patient Details  Name: Marcus Huang MRN: [id number] Date of Birth: 07-20-1951  [facility]) CM/SW Contact  Zenda Alpers Lenn Sink, RN Phone Number: 12/15/2023, 3:25 PM  Clinical Narrative:    Follow up done for DME needs, Order placed for DME- RW.   CM spoke with pt and wife at bedside, discussed DME recommendations and insurance coverage. Explained that insurance will not cover 3n1 to use as shower chair or cover tub/chair chair.  Pt/wife voiced that they would f/u on shower chair and purchase, they only want RW if insurance does not cover the 3n1.  Voiced no preference in DME provider  Wife to transport home, anticipate discharge in the am.   Call made to [employer] for DME need- RW referral- DME to be delivered to room prior to discharge.   No HH needs noted.    Expected Discharge Plan: Home w Home Health Services Barriers to Discharge: Barriers Resolved  Expected Discharge Plan and Services   Discharge Planning Services: CM Consult Post Acute Care Choice: Durable Medical Equipment, Home Health Living arrangements for the past 2 months: Single Family Home                 DME Arranged: Walker rolling DME Agency: [employer] Date DME Agency Contacted: 12/15/23 Time DME Agency Contacted: 1525 Representative spoke with at DME Agency: Ian Malkin HH Arranged: NA HH Agency: NA         Social Determinants of Health (SDOH) Interventions SDOH Screenings   Food Insecurity: No Food Insecurity (12/11/2023)  Housing: Low Risk  (12/11/2023)  Transportation Needs: No Transportation Needs (12/11/2023)  Utilities: Not At Risk (12/11/2023)  Depression (PHQ2-9): Low Risk  (11/06/2022)  Recent Concern: Depression (PHQ2-9) - Medium Risk (10/09/2022)  Financial Resource Strain: Low Risk  (05/17/2023)   Received from [facility]  Social Connections: Socially Integrated (12/11/2023)  Tobacco Use: Low Risk  (12/10/2023)    Readmission Risk Interventions    12/15/2023    3:25 PM  Readmission Risk Prevention Plan  Post Dischage Appt Complete  Medication Screening Complete  Transportation Screening Complete

## 2023-12-15 NOTE — Evaluation (Signed)
 Occupational Therapy Evaluation and Discharge Patient Details Name: Marcus Huang MRN: [id number] DOB: [birth year] Today's Date: 12/15/2023   History of Present Illness   73 year old gentleman presented 12/09/23 for CABG x 3. Extubated 3/27; PMH hypertension, hyperlipidemia, OSA on CPAP, moderate left carotid artery stenosis, type 2 diabetes, and coronary artery disease s/p stents     Clinical Impressions Pt doing well and is at min A level with LB ADLs, Sup with mobility, toilet and shower transfers. PTA pt was Ind with ADLs, IADLs, drives no AD required for mobility. Pt's wife and daughter present and very supportive. Reviewed sternal precautions and LB ADL techniques as well as shower seat education for home use with handout provided. All education completed and no further acute OT services are indicated at this time. OT will sign off     If plan is discharge home, recommend the following:   A little help with bathing/dressing/bathroom;Assist for transportation     Functional Status Assessment   Patient has had a recent decline in their functional status and demonstrates the ability to make significant improvements in function in a reasonable and predictable amount of time.     Equipment Recommendations   3in1 or shower chair     Recommendations for Other Services         Precautions/Restrictions   Precautions Precautions: Sternal Precaution Booklet Issued: Yes (comment) Recall of Precautions/Restrictions: Impaired Precaution/Restrictions Comments: handout for sternal precautions provided Restrictions Weight Bearing Restrictions Per Provider Order: Yes RUE Weight Bearing Per Provider Order: Partial weight bearing RUE Partial Weight Bearing Percentage or Pounds: <5lbs. LUE Weight Bearing Per Provider Order: Partial weight bearing LUE Partial Weight Bearing Percentage or Pounds: <5lbs     Mobility Bed Mobility               General bed mobility comments:  pt in chair    Transfers Overall transfer level: Needs assistance Equipment used: Rolling walker (2 wheels) Transfers: Sit to/from Stand Sit to Stand: Supervision           General transfer comment: vc for proper use of RW and to maintain sternal precautions      Balance Overall balance assessment: No apparent balance deficits (not formally assessed)                                         ADL either performed or assessed with clinical judgement   ADL Overall ADL's : Needs assistance/impaired Eating/Feeding: Independent   Grooming: Wash/dry hands;Wash/dry face;Modified independent;Standing   Upper Body Bathing: Supervision/ safety   Lower Body Bathing: Minimal assistance;With caregiver independent assisting   Upper Body Dressing : Supervision/safety   Lower Body Dressing: Minimal assistance;With caregiver independent assisting   Toilet Transfer: Supervision/safety;Ambulation   Toileting- Clothing Manipulation and Hygiene: Modified independent   Tub/ Shower Transfer: Supervision/safety;Ambulation   Functional mobility during ADLs: Supervision/safety General ADL Comments: pt and family educated on compensatory LB ADL techniques and min A prn     Vision Baseline Vision/History: 1 Wears glasses Ability to See in Adequate Light: 0 Adequate Patient Visual Report: No change from baseline       Perception         Praxis         Pertinent Vitals/Pain Pain Assessment Pain Assessment: No/denies pain     Extremity/Trunk Assessment Upper Extremity Assessment Upper Extremity Assessment: Overall WFL for tasks assessed   Lower  Extremity Assessment Lower Extremity Assessment: Defer to PT evaluation   Cervical / Trunk Assessment Cervical / Trunk Assessment: Normal   Communication Communication Communication: No apparent difficulties   Cognition Arousal: Alert Behavior During Therapy: WFL for tasks assessed/performed Cognition: No apparent  impairments                               Following commands: Intact       Cueing  General Comments          Exercises     Shoulder Instructions      Home Living Family/patient expects to be discharged to:: Private residence Living Arrangements: Spouse/significant other Available Help at Discharge: Family;Available 24 hours/day Type of Home: House Home Access: Stairs to enter [employer] of Steps: 1 Entrance Stairs-Rails: None Home Layout: One level     Bathroom Shower/Tub: Producer, television/film/video: Standard     Home Equipment: Shower seat - built in;Grab bars - tub/shower;Toilet riser          Prior Functioning/Environment Prior Level of Function : Independent/Modified Independent;Driving;Working/employed                    OT Problem List: Impaired balance (sitting and/or standing);Decreased activity tolerance;Decreased knowledge of use of DME or AE   OT Treatment/Interventions:        OT Goals(Current goals can be found in the care plan section)   Acute Rehab OT Goals Patient Stated Goal: go home   OT Frequency:       Co-evaluation              AM-PAC OT "6 Clicks" Daily Activity     Outcome Measure Help from another person eating meals?: None Help from another person taking care of personal grooming?: None Help from another person toileting, which includes using toliet, bedpan, or urinal?: None Help from another person bathing (including washing, rinsing, drying)?: A Little Help from another person to put on and taking off regular upper body clothing?: None Help from another person to put on and taking off regular lower body clothing?: A Little 6 Click Score: 22   End of Session Equipment Utilized During Treatment: Other (comment) (sternal pillow)  Activity Tolerance: Patient tolerated treatment well Patient left: in chair;with family/visitor present  OT Visit Diagnosis: Unsteadiness on feet  (R26.81)                Time: 1001-1027 OT Time Calculation (min): 26 min Charges:  OT General Charges $OT Visit: 1 Visit OT Evaluation $OT Eval Low Complexity: 1 Low OT Treatments $Therapeutic Activity: 8-22 mins    Galen Manila 12/15/2023, 12:38 PM

## 2023-12-15 NOTE — Progress Notes (Signed)
 CARDIAC REHAB PHASE I     Post OHS education including site care, restrictions, heart healthy diabetic diet, sternal precautions, IS use at home, home needs at discharge, exercise guidelines and CRP2 reviewed. All questions and concerns addressed. Will refer to [facility] for CRP2. Will continue to follow.    Woodroe Chen, RN BSN 12/15/2023

## 2023-12-15 NOTE — Evaluation (Signed)
 Physical Therapy Evaluation and Discharge Patient Details Name: Marcus Huang MRN: [id number] DOB: 30-May-1951 Today's Date: 12/15/2023  History of Present Illness  73 year old gentleman presented 12/09/23 for CABG x 3. Extubated 3/27; PMH hypertension, hyperlipidemia, OSA on CPAP, moderate left carotid artery stenosis, type 2 diabetes, and coronary artery disease s/p stents  Clinical Impression  Patient evaluated by Physical Therapy with no further acute PT needs identified. All education has been completed and the patient has no further questions. Continues to need cues to maintain sternal precautions and wife very aware of this and cuing pt appropriately. OT to see pt to answer her questions re: shower seat and to review sternal precautions with ADLs. See below for any follow-up Physical Therapy or equipment needs. PT is signing off. Thank you for this referral.         If plan is discharge home, recommend the following: A little help with walking and/or transfers;Assistance with cooking/housework;Assist for transportation;Help with stairs or ramp for entrance   Can travel by private vehicle        Equipment Recommendations Rolling walker (2 wheels)  Recommendations for Other Services  OT consult    Functional Status Assessment Patient has had a recent decline in their functional status and demonstrates the ability to make significant improvements in function in a reasonable and predictable amount of time.     Precautions / Restrictions Precautions Precautions: Sternal Precaution Booklet Issued: No Recall of Precautions/Restrictions: Impaired Precaution/Restrictions Comments: able to verbalize precautions but needs cues to adhere      Mobility  Bed Mobility                    Transfers Overall transfer level: Needs assistance Equipment used: Rolling walker (2 wheels) Transfers: Sit to/from Stand Sit to Stand: Supervision           General transfer comment: vc  for proper use of RW and to maintain sternal precautions    Ambulation/Gait Ambulation/Gait assistance: Supervision Gait Distance (Feet): 200 Feet Assistive device: Rolling walker (2 wheels), None Gait Pattern/deviations: WFL(Within Functional Limits)   Gait velocity interpretation: >2.62 ft/sec, indicative of community ambulatory   General Gait Details: pt without imbalance or drift; concerned re: walking outdoors over uneven surface and requesting RW; discussed ? need for rollator and he prefers RW  Careers information officer     Tilt Bed    Modified Rankin (Stroke Patients Only)       Balance Overall balance assessment: Independent (on level surfaces)                                           Pertinent Vitals/Pain Pain Assessment Pain Assessment: No/denies pain    Home Living Family/patient expects to be discharged to:: Private residence Living Arrangements: Spouse/significant other Available Help at Discharge: Family;Available 24 hours/day Type of Home: House Home Access: Stairs to enter Entrance Stairs-Rails: None Entrance Stairs-Number of Steps: 1   Home Layout: One level Home Equipment: Shower seat - built in;Grab bars - tub/shower;Toilet riser      Prior Function Prior Level of Function : Independent/Modified Independent;Driving;Working/employed (website development)                     Extremity/Trunk Assessment   Upper Extremity Assessment Upper Extremity Assessment: Defer to OT evaluation  Lower Extremity Assessment Lower Extremity Assessment: Overall WFL for tasks assessed    Cervical / Trunk Assessment Cervical / Trunk Assessment: Normal  Communication   Communication Communication: No apparent difficulties    Cognition Arousal: Alert Behavior During Therapy: WFL for tasks assessed/performed   PT - Cognitive impairments: No apparent impairments                         Following  commands: Intact       Cueing Cueing Techniques: Verbal cues     General Comments      Exercises     Assessment/Plan    PT Assessment Patient does not need any further PT services  PT Problem List         PT Treatment Interventions      PT Goals (Current goals can be found in the Care Plan section)  Acute Rehab PT Goals Patient Stated Goal: Return to walking outdoors PT Goal Formulation: All assessment and education complete, DC therapy    Frequency       Co-evaluation               AM-PAC PT "6 Clicks" Mobility  Outcome Measure Help needed turning from your back to your side while in a flat bed without using bedrails?: A Little Help needed moving from lying on your back to sitting on the side of a flat bed without using bedrails?: A Little Help needed moving to and from a bed to a chair (including a wheelchair)?: A Little Help needed standing up from a chair using your arms (e.g., wheelchair or bedside chair)?: A Little Help needed to walk in hospital room?: A Little Help needed climbing 3-5 steps with a railing? : A Little 6 Click Score: 18    End of Session   Activity Tolerance: Patient tolerated treatment well Patient left: in chair;with call bell/phone within reach;with family/visitor present Nurse Communication: Mobility status;Other (comment) (no f/u PT; RW; OT to see) PT Visit Diagnosis: Difficulty in walking, not elsewhere classified (R26.2)    Time: 8295-6213 PT Time Calculation (min) (ACUTE ONLY): 16 min   Charges:   PT Evaluation $PT Eval Low Complexity: 1 Low   PT General Charges $$ ACUTE PT VISIT: 1 Visit          Jerolyn Center, PT Acute Rehabilitation Services  Office [phone]   Zena Amos 12/15/2023, 9:07 AM

## 2023-12-15 NOTE — Progress Notes (Addendum)
 [address]       [place] [address]             [phone]      5 Days Post-Op Procedure(s) (LRB): CORONARY ARTERY BYPASS GRAFTING (CABG) xTHREE USING LEFT INTERNAL MAMMARY ARTERY AND ENDOSCOPICALLY HARVESTED RIGHT GREATER SAPPHENOUS VEIN (N/A) ECHOCARDIOGRAM, TRANSESOPHAGEAL, INTRAOPERATIVE (N/A) Subjective: No complaints this AM. Sitting in the chair, just ate breakfast. Has ambulated this AM.   Objective: Vital signs in last 24 hours: Temp:  [98 F (36.7 C)-99.1 F (37.3 C)] 98.4 F (36.9 C) (04/01 0312) Pulse Rate:  [70-92] 72 (04/01 0312) Cardiac Rhythm: Normal sinus rhythm (03/31 2050) Resp:  [13-22] 18 (04/01 0312) BP: (103-161)/(61-80) 132/61 (04/01 0312) SpO2:  [92 %-95 %] 93 % (04/01 0312) Weight:  [73.7 kg] 73.7 kg (04/01 0620)  Hemodynamic parameters for last 24 hours:    Intake/Output from previous day: 03/31 0701 - 04/01 0700 In: 250.4 [P.O.:240; I.V.:10.4] Out: 1325 [Urine:1325] Intake/Output this shift: No intake/output data recorded.  General appearance: alert, cooperative, and no distress Neurologic: intact Heart: regular rate and rhythm, S1, S2 normal, no murmur, click, rub or gallop Lungs: clear to auscultation bilaterally Abdomen: soft, non-tender; bowel sounds normal; no masses,  no organomegaly Extremities: extremities normal, atraumatic, no cyanosis or edema Wound: Clean and dry without sign of infection  Lab Results: Recent Labs    12/13/23 0350  WBC 9.6  HGB 9.1*  HCT 26.3*  PLT 131*   BMET:  Recent Labs    12/13/23 2113 12/15/23 0510  NA 139 139  K 3.7 4.0  CL 100 100  CO2 27 23  GLUCOSE 153* 135*  BUN 25* 18  CREATININE 1.10 1.01  CALCIUM 8.5* 9.0    PT/INR: No results for input(s): "LABPROT", "INR" in the last 72 hours. ABG    Component Value Date/Time   PHART 7.376 12/12/2023 0356   HCO3 20.9 12/12/2023 0356   TCO2 22 12/12/2023 0356   ACIDBASEDEF 4.0 (H) 12/12/2023 0356   O2SAT 91  12/12/2023 0356   CBG (last 3)  Recent Labs    12/13/23 1601 12/13/23 2051 12/14/23 0616  GLUCAP 111* 115* 108*    Assessment/Plan: S/P Procedure(s) (LRB): CORONARY ARTERY BYPASS GRAFTING (CABG) xTHREE USING LEFT INTERNAL MAMMARY ARTERY AND ENDOSCOPICALLY HARVESTED RIGHT GREATER SAPPHENOUS VEIN (N/A) ECHOCARDIOGRAM, TRANSESOPHAGEAL, INTRAOPERATIVE (N/A)  CV: Hx of postop afib, on PO Amiodarone 400mg  BID. NSR, HR 90s this AM.  Hx of HTN, SBP 120s-150s. Better control on Lopressor 50mg  BID, Norvasc 10mg  daily. Will add Lisinopril 5mg  daily.   Pulm: Saturating 93% on RA. Last CXR 03/30 with right basilar atelectasis and possibly small right pleural effusion. Encourage IS and ambulation.   GI: +BM, tolerating a diet.   Endo: Hx of diabetes, diet controlled. Preop A1C 5.6. Follows up with PCP.   Renal: Cr 1.01. UO 1325cc/24hrs. Lasix has been held due to good diuresis. Under preop weight. K 4.0.   ID: Likely reactive leukocytosis resolved. Tmax 99.1. No sign of infection.  Expected postop ABLA: Last H/H 9.1/26.2, not clinically significant at this time. Reactive thrombocytopenia improving, plt 131,000.  DVT Prophylaxis: Lovenox  Deconditioning: Case manager requested PT/OT evals, these are pending. Work with PT/OT. Will order a RW for home.   Dispo: Plan to d/c home tomorrow AM if patient remains stable.    LOS: 5 days    Jenny Reichmann, PA-C 12/15/2023   Chart reviewed, patient examined, agree with above.  He has felt well today and ambulating well. Rhythm has been stable sinus. Plan home tomorrow if no changes.

## 2023-12-15 NOTE — Plan of Care (Signed)

## 2023-12-16 ENCOUNTER — Other Ambulatory Visit (HOSPITAL_COMMUNITY): Payer: Self-pay

## 2023-12-16 MED ORDER — ASPIRIN 325 MG PO TBEC: 325.0000 mg | DELAYED_RELEASE_TABLET | Freq: Every day | ORAL | Status: DC

## 2023-12-16 MED ORDER — AMLODIPINE BESYLATE 10 MG PO TABS
10.0000 mg | ORAL_TABLET | Freq: Every day | ORAL | 1 refills | Status: DC
  Filled 2023-12-16: qty 30, 30d supply, fill #0
  Filled 2024-01-10: qty 30, 30d supply, fill #1

## 2023-12-16 MED ORDER — OXYCODONE HCL 5 MG PO TABS
5.0000 mg | ORAL_TABLET | Freq: Four times a day (QID) | ORAL | 0 refills | Status: DC | PRN
  Filled 2023-12-16: qty 28, 7d supply, fill #0

## 2023-12-16 MED ORDER — AMIODARONE HCL 200 MG PO TABS
ORAL_TABLET | ORAL | 1 refills | Status: DC
  Filled 2023-12-16: qty 60, 32d supply, fill #0
  Filled 2024-01-10: qty 60, 32d supply, fill #1

## 2023-12-16 MED ORDER — LISINOPRIL 5 MG PO TABS
5.0000 mg | ORAL_TABLET | Freq: Every day | ORAL | 1 refills | Status: AC
  Filled 2023-12-16: qty 30, 30d supply, fill #0
  Filled 2024-01-10: qty 30, 30d supply, fill #1

## 2023-12-16 MED ORDER — ACETAMINOPHEN 325 MG PO TABS: 650.0000 mg | ORAL_TABLET | Freq: Four times a day (QID) | ORAL | Status: AC | PRN

## 2023-12-16 MED ORDER — METOPROLOL TARTRATE 50 MG PO TABS
50.0000 mg | ORAL_TABLET | Freq: Two times a day (BID) | ORAL | 1 refills | Status: AC
  Filled 2023-12-16: qty 60, 30d supply, fill #0
  Filled 2024-01-10: qty 60, 30d supply, fill #1

## 2023-12-16 MED FILL — Sodium Chloride IV Soln 0.9%: INTRAVENOUS | Qty: 2000 | Status: AC

## 2023-12-16 MED FILL — Mannitol IV Soln 20%: INTRAVENOUS | Qty: 500 | Status: AC

## 2023-12-16 MED FILL — Electrolyte-R (PH 7.4) Solution: INTRAVENOUS | Qty: 4000 | Status: AC

## 2023-12-16 MED FILL — Heparin Sodium (Porcine) Inj 1000 Unit/ML: INTRAMUSCULAR | Qty: 20 | Status: AC

## 2023-12-16 MED FILL — Lidocaine HCl Local Soln Prefilled Syringe 100 MG/5ML (2%): INTRAMUSCULAR | Qty: 5 | Status: AC

## 2023-12-16 NOTE — Progress Notes (Addendum)
 [address]       [place] [address]             [phone]      6 Days Post-Op Procedure(s) (LRB): CORONARY ARTERY BYPASS GRAFTING (CABG) xTHREE USING LEFT INTERNAL MAMMARY ARTERY AND ENDOSCOPICALLY HARVESTED RIGHT GREATER SAPPHENOUS VEIN (N/A) ECHOCARDIOGRAM, TRANSESOPHAGEAL, INTRAOPERATIVE (N/A) Subjective: Patient with no new complaints. States he is ready to go home.   Objective: Vital signs in last 24 hours: Temp:  [97.9 F (36.6 C)-98.2 F (36.8 C)] 98 F (36.7 C) (04/02 0313) Pulse Rate:  [65-90] 65 (04/02 0313) Cardiac Rhythm: Normal sinus rhythm (04/01 1900) Resp:  [14-24] 15 (04/02 0313) BP: (121-140)/(62-78) 121/66 (04/02 0313) SpO2:  [94 %-98 %] 94 % (04/02 0313) Weight:  [73.4 kg] 73.4 kg (04/02 0610)  Hemodynamic parameters for last 24 hours:    Intake/Output from previous day: No intake/output data recorded. Intake/Output this shift: No intake/output data recorded.  General appearance: alert, cooperative, and no distress Neurologic: intact Heart: regular rate and rhythm, S1, S2 normal, no murmur, click, rub or gallop Lungs: clear to auscultation bilaterally Abdomen: soft, non-tender; bowel sounds normal; no masses,  no organomegaly Extremities: trace edema of the RLE, no edema of the LLE Wound: Clean and dry without sign of infection  Lab Results: No results for input(s): "WBC", "HGB", "HCT", "PLT" in the last 72 hours. BMET:  Recent Labs    12/13/23 2113 12/15/23 0510  NA 139 139  K 3.7 4.0  CL 100 100  CO2 27 23  GLUCOSE 153* 135*  BUN 25* 18  CREATININE 1.10 1.01  CALCIUM 8.5* 9.0    PT/INR: No results for input(s): "LABPROT", "INR" in the last 72 hours. ABG    Component Value Date/Time   PHART 7.376 12/12/2023 0356   HCO3 20.9 12/12/2023 0356   TCO2 22 12/12/2023 0356   ACIDBASEDEF 4.0 (H) 12/12/2023 0356   O2SAT 91 12/12/2023 0356   CBG (last 3)  Recent Labs    12/13/23 1601 12/13/23 2051  12/14/23 0616  GLUCAP 111* 115* 108*    Assessment/Plan: S/P Procedure(s) (LRB): CORONARY ARTERY BYPASS GRAFTING (CABG) xTHREE USING LEFT INTERNAL MAMMARY ARTERY AND ENDOSCOPICALLY HARVESTED RIGHT GREATER SAPPHENOUS VEIN (N/A) ECHOCARDIOGRAM, TRANSESOPHAGEAL, INTRAOPERATIVE (N/A)  CV: Hx of postop afib, on PO Amiodarone 400mg  BID. NSR, HR 70s-80s this AM. Hx of HTN, SBP 120s-130s much better control on Lopressor 50mg  BID, Norvasc 10mg  daily, and Lisinopril 5mg  daily.    Pulm: Saturating 94% on RA. Last CXR 03/30 with right basilar atelectasis and possible small right pleural effusion. Encourage IS and ambulation.    GI: +BM, tolerating a diet.    Endo: Hx of diabetes, diet controlled. Preop A1C 5.6. Follows up with PCP.    Renal: Last Cr 1.01, stable. No UO recorded. Under preop weight, Lasix has been d/c'd. Last K 4.0.    ID: Likely reactive leukocytosis resolved. Tmax 98.2. No sign of infection.   Expected postop ABLA: Last H/H 9.1/26.2, not clinically significant at this time. Reactive thrombocytopenia improved, plt 131,000.   DVT Prophylaxis: Lovenox   Deconditioning: Ambulating well, PT/OT recommends no acute follow up. RW has been ordered.   PAD and carotid artery stenosis: Follows up with vascular surgery   Dispo: Plan to D/C home with wife this AM.    LOS: 6 days    Jenny Reichmann, PA-C 12/16/2023   Chart reviewed, patient examined, agree with above.  He feels well and  has been up walking the halls by himself this am. Rhythm has been stable sinus on po amio and Lopressor. Will plan to send home today.

## 2023-12-16 NOTE — TOC Transition Note (Signed)
 Transition of Care (TOC) - Discharge Note Donn Pierini RN, BSN Transitions of Care Unit 4E- RN Case Manager See Treatment Team for direct phone #   Patient Details  Name: Marcus Huang MRN: [id number] Date of Birth: December 21, 1950  Transition of Care [facility]) CM/SW Contact:  Darrold Span, RN Phone Number: 12/16/2023, 9:38 AM   Clinical Narrative:    Pt stable for transition home today, no HH needs, DME RW has been delivered.  Wife to transport home  No further TOC needs noted.    Final next level of care: Home/Self Care Barriers to Discharge: Barriers Resolved   Patient Goals and CMS Choice Patient states their goals for this hospitalization and ongoing recovery are:: plan to return home once stable. CMS Medicare.gov Compare Post Acute Care list provided to:: Patient Choice offered to / list presented to : Patient, Spouse      Discharge Placement               Home        Discharge Plan and Services Additional resources added to the After Visit Summary for     Discharge Planning Services: CM Consult Post Acute Care Choice: Durable Medical Equipment, Home Health          DME Arranged: Walker rolling DME Agency: [employer] Date DME Agency Contacted: 12/15/23 Time DME Agency Contacted: 1525 Representative spoke with at DME Agency: Ian Malkin HH Arranged: NA HH Agency: NA        Social Drivers of Health (SDOH) Interventions SDOH Screenings   Food Insecurity: No Food Insecurity (12/11/2023)  Housing: Low Risk  (12/11/2023)  Transportation Needs: No Transportation Needs (12/11/2023)  Utilities: Not At Risk (12/11/2023)  Depression (PHQ2-9): Low Risk  (11/06/2022)  Recent Concern: Depression (PHQ2-9) - Medium Risk (10/09/2022)  Financial Resource Strain: Low Risk  (05/17/2023)   Received from [facility]  Social Connections: Socially Integrated (12/11/2023)  Tobacco Use: Low Risk  (12/10/2023)     Readmission Risk Interventions    12/15/2023     3:25 PM  Readmission Risk Prevention Plan  Post Dischage Appt Complete  Medication Screening Complete  Transportation Screening Complete

## 2023-12-16 NOTE — Progress Notes (Signed)
 Viona Gilmore to be D/C'd home per MD order. Discussed with the patient and all questions fully answered. TOC medications picked up from pharmacy. Skin clean, dry and intact without evidence of skin break down, no evidence of skin tears noted. Midsternal incision clean and intact. IV catheter discontinued intact. Site without signs and symptoms of complications. Dressing and pressure applied.  An After Visit Summary was printed and given to the patient.  Patient escorted via WC, and D/C home via private auto.  Jon Gills  12/16/2023

## 2023-12-21 ENCOUNTER — Ambulatory Visit: Admitting: *Deleted

## 2023-12-21 DIAGNOSIS — Z4802 Encounter for removal of sutures: Secondary | ICD-10-CM

## 2023-12-21 NOTE — Progress Notes (Signed)
 Patient arrived for nurse visit to remove sutures post-CABG 3/27 with Dr. Laneta Simmers.  Four sutures removed with no signs or symptoms of infection noted.  Incisions well approximated. Patient tolerated suture removal well.  Patient and family instructed to keep the incision site clean and dry.  Patient with bilateral ankle swelling, right greater than left. Advised patient to elevate legs while at rest. Advised to wear compression socks if able. Patient with questions regarding CBG readings at home. Advised to contact PCP for management. Discussed upcoming appts with patient and wife. Patient and family acknowledged instructions given.  All questions answered.

## 2023-12-22 ENCOUNTER — Other Ambulatory Visit: Payer: Self-pay | Admitting: Physician Assistant

## 2023-12-22 MED ORDER — OXYCODONE HCL 5 MG PO TABS: 5.0000 mg | ORAL_TABLET | Freq: Four times a day (QID) | ORAL | 0 refills | Status: DC | PRN

## 2023-12-25 ENCOUNTER — Telehealth: Payer: Self-pay

## 2023-12-25 ENCOUNTER — Other Ambulatory Visit: Payer: Self-pay | Admitting: Physician Assistant

## 2023-12-25 ENCOUNTER — Ambulatory Visit
Admission: RE | Admit: 2023-12-25 | Discharge: 2023-12-25 | Disposition: A | Discharge status: Home / Self Care | Source: Ambulatory Visit | Attending: Physician Assistant | Admitting: Physician Assistant

## 2023-12-25 DIAGNOSIS — M79604 Pain in right leg: Secondary | ICD-10-CM | POA: Diagnosis not present

## 2023-12-25 DIAGNOSIS — M7989 Other specified soft tissue disorders: Secondary | ICD-10-CM | POA: Insufficient documentation

## 2023-12-25 NOTE — Telephone Encounter (Signed)
 Patient contacted the office stating that the bruise/ pain behind his knee on the right leg has not stopped hurting since discharge. He states that the bruise is going away but the pain has stayed consistent. He spoke with his Cardiology office at an appt and patient was told that he could have a [place] of his leg to check for blood clots near his home. Patient advised that having is checked is important and he can have this done near his home. He is contacting his Cardiology office for further instructions.

## 2023-12-28 ENCOUNTER — Telehealth: Payer: Self-pay | Admitting: *Deleted

## 2023-12-28 DIAGNOSIS — E119 Type 2 diabetes mellitus without complications: Secondary | ICD-10-CM | POA: Diagnosis not present

## 2023-12-28 DIAGNOSIS — Z955 Presence of coronary angioplasty implant and graft: Secondary | ICD-10-CM | POA: Diagnosis not present

## 2023-12-28 DIAGNOSIS — Z789 Other specified health status: Secondary | ICD-10-CM | POA: Diagnosis not present

## 2023-12-28 DIAGNOSIS — I10 Essential (primary) hypertension: Secondary | ICD-10-CM | POA: Diagnosis not present

## 2023-12-28 DIAGNOSIS — E7800 Pure hypercholesterolemia, unspecified: Secondary | ICD-10-CM | POA: Diagnosis not present

## 2023-12-28 DIAGNOSIS — I25721 Atherosclerosis of autologous artery coronary artery bypass graft(s) with angina pectoris with documented spasm: Secondary | ICD-10-CM | POA: Diagnosis not present

## 2023-12-28 NOTE — Telephone Encounter (Signed)
 Patient contacted the office regarding DVT scan done on Friday. States the ordering provider spoke with on call MD who advised patient to contact our office on Monday for further guidance. Patient states that since Friday, the pain, swelling and bruising has gotten better. Denies heat or redness to area. Reviewed US  with Marya Smack, PA. Patient advised DVT is not present on report. Advised to continue to monitor thigh for signs and symptoms of infection and to call if symptoms worsen. Patient verbalized understanding.

## 2024-01-12 ENCOUNTER — Other Ambulatory Visit: Payer: Self-pay | Admitting: Surgery

## 2024-01-12 DIAGNOSIS — H93A3 Pulsatile tinnitus, bilateral: Secondary | ICD-10-CM | POA: Diagnosis not present

## 2024-01-12 DIAGNOSIS — I25119 Atherosclerotic heart disease of native coronary artery with unspecified angina pectoris: Secondary | ICD-10-CM

## 2024-01-12 DIAGNOSIS — R569 Unspecified convulsions: Secondary | ICD-10-CM | POA: Diagnosis not present

## 2024-01-13 ENCOUNTER — Encounter: Payer: Self-pay | Admitting: Surgery

## 2024-01-13 ENCOUNTER — Other Ambulatory Visit (HOSPITAL_COMMUNITY): Payer: Self-pay

## 2024-01-13 ENCOUNTER — Ambulatory Visit: Payer: Self-pay | Attending: Surgery | Admitting: Surgery

## 2024-01-13 ENCOUNTER — Ambulatory Visit (HOSPITAL_COMMUNITY)
Admission: RE | Admit: 2024-01-13 | Discharge: 2024-01-13 | Disposition: A | Discharge status: Home / Self Care | Payer: Self-pay | Source: Ambulatory Visit | Attending: Surgery | Admitting: Surgery

## 2024-01-13 VITALS — BP 131/77 | HR 45 | Resp 18 | Ht 68.0 in | Wt 165.0 lb

## 2024-01-13 DIAGNOSIS — I25119 Atherosclerotic heart disease of native coronary artery with unspecified angina pectoris: Secondary | ICD-10-CM | POA: Insufficient documentation

## 2024-01-13 DIAGNOSIS — Z951 Presence of aortocoronary bypass graft: Secondary | ICD-10-CM | POA: Diagnosis not present

## 2024-01-13 NOTE — Progress Notes (Signed)
    HPI: Patient returns for routine postoperative follow-up having undergone prior to bypass graft surgery x 3 on 12/10/2023. The patient's early postoperative recovery while in the hospital was notable for development of postoperative atrial fibrillation converted with amiodarone . Since hospital discharge the patient reports that the first couple weeks for tough but he is turning the corner over the past few days with an improvement in his stamina.  He denies any chest pain or shortness of breath.   Current Outpatient Medications  Medication Sig Dispense Refill   acetaminophen  (TYLENOL ) 325 MG tablet Take 2 tablets (650 mg total) by mouth every 6 (six) hours as needed for mild pain (pain score 1-3).     amiodarone  (PACERONE ) 200 MG tablet Take 2 tablets by mouth twice per day for 7 days, then take 1 tablet twice per day for 7 days, then take 1 tablet daily thereafter 60 tablet 1   amLODipine  (NORVASC ) 10 MG tablet Take 1 tablet (10 mg total) by mouth daily. 30 tablet 1   aspirin  EC 81 MG tablet Take 81 mg by mouth daily. Swallow whole.     lisinopril  (ZESTRIL ) 5 MG tablet Take 1 tablet (5 mg total) by mouth daily. 30 tablet 1   metoprolol  tartrate (LOPRESSOR ) 50 MG tablet Take 1 tablet (50 mg total) by mouth 2 (two) times daily. 60 tablet 1   Omega-3 Fatty Acids (OMEGA 3 500 PO) Take 500 mg by mouth daily.     oxyCODONE  (OXY IR/ROXICODONE ) 5 MG immediate release tablet Take 1 tablet (5 mg total) by mouth every 6 (six) hours as needed for severe pain (pain score 7-10). 28 tablet 0   PREVIDENT 5000 SENSITIVE 1.1-5 % GEL Place 1 Application onto teeth 2 (two) times daily.     aspirin  EC 325 MG tablet Take 1 tablet (325 mg total) by mouth daily. (Patient not taking: Reported on 01/13/2024)     No current facility-administered medications for this visit.    Physical Exam: BP 131/77   Pulse (!) 45   Resp 18   Ht 5\' 8"  (1.727 m)   Wt 165 lb (74.8 kg)   SpO2 96%   BMI 25.09 kg/m  He looks  well. Cardiac exam shows regular rate and rhythm with normal heart sounds. Lungs are clear. The chest incision is healing well and the sternum is stable. There is no peripheral edema.  Diagnostic Tests:  Narrative & Impression  CLINICAL DATA:  cabg   EXAM: CHEST - 2 VIEW   COMPARISON:  December 13, 2023   FINDINGS: Sternotomy wires. No focal airspace consolidation, pleural effusion, or pneumothorax. No cardiomegaly. No acute fracture or destructive lesion. Multilevel thoracic osteophytosis.   IMPRESSION: No acute cardiopulmonary abnormality.     Electronically Signed   By: Rance Burrows M.D.   On: 01/13/2024 09:19    Impression:  Overall I think he is doing well 1 month following his surgery.  I told him he can return to driving a car at this time but should refrain with the anything heavier than 10 pounds for 3 months postoperatively.  I think he can begin cardiac rehab at [facility] anytime.   Plan:  He will continue to follow-up with cardiology and will return to see me if he has any problems with his incisions.   Bartley Lightning, MD [facility] [phone]

## 2024-01-15 ENCOUNTER — Other Ambulatory Visit: Payer: Self-pay | Admitting: Neurology

## 2024-01-15 DIAGNOSIS — H93A3 Pulsatile tinnitus, bilateral: Secondary | ICD-10-CM

## 2024-01-15 DIAGNOSIS — R569 Unspecified convulsions: Secondary | ICD-10-CM

## 2024-01-16 ENCOUNTER — Encounter: Payer: Self-pay | Admitting: Surgery

## 2024-01-18 ENCOUNTER — Encounter: Attending: Cardiology

## 2024-01-18 ENCOUNTER — Other Ambulatory Visit: Payer: Self-pay

## 2024-01-18 DIAGNOSIS — Z951 Presence of aortocoronary bypass graft: Secondary | ICD-10-CM | POA: Insufficient documentation

## 2024-01-18 DIAGNOSIS — Z48812 Encounter for surgical aftercare following surgery on the circulatory system: Secondary | ICD-10-CM | POA: Insufficient documentation

## 2024-01-18 NOTE — Progress Notes (Signed)
 Virtual Visit completed. Patient informed on EP and RD appointment and 6 Minute walk test. Patient also informed of patient health questionnaires on My Chart. Patient Verbalizes understanding. Visit diagnosis can be found in [facility] 12/10/2023.

## 2024-01-20 ENCOUNTER — Encounter

## 2024-01-20 VITALS — Ht 67.5 in | Wt 162.3 lb

## 2024-01-20 DIAGNOSIS — Z951 Presence of aortocoronary bypass graft: Secondary | ICD-10-CM | POA: Diagnosis not present

## 2024-01-20 DIAGNOSIS — Z48812 Encounter for surgical aftercare following surgery on the circulatory system: Secondary | ICD-10-CM | POA: Diagnosis not present

## 2024-01-20 NOTE — Patient Instructions (Signed)
 Patient Instructions  Patient Details  Name: Marcus Huang MRN: [id number] Date of Birth: 1950/11/26 Referring Provider:  Percival Brace, MD  Below are your personal goals for exercise, nutrition, and risk factors. Our goal is to help you stay on track towards obtaining and maintaining these goals. We will be discussing your progress on these goals with you throughout the program.  Initial Exercise Prescription:  Initial Exercise Prescription - 01/20/24 0900       Date of Initial Exercise RX and Referring Provider   Date 01/20/24    Referring Provider Dr. Percival Brace      Oxygen   Maintain Oxygen Saturation 88% or higher      Treadmill   MPH 2.8    Grade 1    Minutes 15    METs 3.53      Elliptical   Level 1    Speed 3    Minutes 15    METs 3.32      REL-XR   Level 3    Speed 50    Minutes 15    METs 3.32      Prescription Details   Frequency (times per week) 2    Duration Progress to 30 minutes of continuous aerobic without signs/symptoms of physical distress      Intensity   THRR 40-80% of Max Heartrate 87-129    Ratings of Perceived Exertion 11-13    Perceived Dyspnea 0-4      Progression   Progression Continue to progress workloads to maintain intensity without signs/symptoms of physical distress.      Resistance Training   Training Prescription Yes    Weight 4 lb    Reps 10-15             Exercise Goals: Frequency: Be able to perform aerobic exercise two to three times per week in program working toward 2-5 days per week of home exercise.  Intensity: Work with a perceived exertion of 11 (fairly light) - 15 (hard) while following your exercise prescription.  We will make changes to your prescription with you as you progress through the program.   Duration: Be able to do 30 to 45 minutes of continuous aerobic exercise in addition to a 5 minute warm-up and a 5 minute cool-down routine.   Nutrition Goals: Your personal nutrition goals  will be established when you do your nutrition analysis with the dietician.  The following are general nutrition guidelines to follow: Cholesterol < 200mg /day Sodium < 1500mg /day Fiber: Men over 50 yrs - 30 grams per day  Personal Goals:  Personal Goals and Risk Factors at Admission - 01/18/24 1339       Core Components/Risk Factors/Patient Goals on Admission    Weight Management Yes;Weight Maintenance    Intervention Weight Management: Develop a combined nutrition and exercise program designed to reach desired caloric intake, while maintaining appropriate intake of nutrient and fiber, sodium and fats, and appropriate energy expenditure required for the weight goal.;Weight Management: Provide education and appropriate resources to help participant work on and attain dietary goals.;Weight Management/Obesity: Establish reasonable short term and long term weight goals.    Expected Outcomes Short Term: Continue to assess and modify interventions until short term weight is achieved;Weight Maintenance: Understanding of the daily nutrition guidelines, which includes 25-35% calories from fat, 7% or less cal from saturated fats, less than 200mg  cholesterol, less than 1.5gm of sodium, & 5 or more servings of fruits and vegetables daily;Understanding recommendations for meals to include 15-35% energy  as protein, 25-35% energy from fat, 35-60% energy from carbohydrates, less than 200mg  of dietary cholesterol, 20-35 gm of total fiber daily;Understanding of distribution of calorie intake throughout the day with the consumption of 4-5 meals/snacks    Diabetes Yes    Intervention Provide education about signs/symptoms and action to take for hypo/hyperglycemia.;Provide education about proper nutrition, including hydration, and aerobic/resistive exercise prescription along with prescribed medications to achieve blood glucose in normal ranges: Fasting glucose 65-99 mg/dL    Expected Outcomes Short Term: Participant  verbalizes understanding of the signs/symptoms and immediate care of hyper/hypoglycemia, proper foot care and importance of medication, aerobic/resistive exercise and nutrition plan for blood glucose control.;Long Term: Attainment of HbA1C < 7%.    Hypertension Yes    Intervention Provide education on lifestyle modifcations including regular physical activity/exercise, weight management, moderate sodium restriction and increased consumption of fresh fruit, vegetables, and low fat dairy, alcohol moderation, and smoking cessation.;Monitor prescription use compliance.    Expected Outcomes Short Term: Continued assessment and intervention until BP is < 140/58mm HG in hypertensive participants. < 130/42mm HG in hypertensive participants with diabetes, heart failure or chronic kidney disease.;Long Term: Maintenance of blood pressure at goal levels.            Exercise Goals and Review:  Exercise Goals     Row Name 01/20/24 0923             Exercise Goals   Increase Physical Activity Yes       Intervention Provide advice, education, support and counseling about physical activity/exercise needs.;Develop an individualized exercise prescription for aerobic and resistive training based on initial evaluation findings, risk stratification, comorbidities and participant's personal goals.       Expected Outcomes Short Term: Attend rehab on a regular basis to increase amount of physical activity.;Long Term: Add in home exercise to make exercise part of routine and to increase amount of physical activity.;Long Term: Exercising regularly at least 3-5 days a week.       Increase Strength and Stamina Yes       Intervention Provide advice, education, support and counseling about physical activity/exercise needs.;Develop an individualized exercise prescription for aerobic and resistive training based on initial evaluation findings, risk stratification, comorbidities and participant's personal goals.        Expected Outcomes Short Term: Increase workloads from initial exercise prescription for resistance, speed, and METs.;Short Term: Perform resistance training exercises routinely during rehab and add in resistance training at home;Long Term: Improve cardiorespiratory fitness, muscular endurance and strength as measured by increased METs and functional capacity ( )       Able to understand and use rate of perceived exertion (RPE) scale Yes       Intervention Provide education and explanation on how to use RPE scale       Expected Outcomes Short Term: Able to use RPE daily in rehab to express subjective intensity level;Long Term:  Able to use RPE to guide intensity level when exercising independently       Able to understand and use Dyspnea scale Yes       Intervention Provide education and explanation on how to use Dyspnea scale       Expected Outcomes Short Term: Able to use Dyspnea scale daily in rehab to express subjective sense of shortness of breath during exertion;Long Term: Able to use Dyspnea scale to guide intensity level when exercising independently       Knowledge and understanding of Target Heart Rate Range (THRR) Yes  Intervention Provide education and explanation of THRR including how the numbers were predicted and where they are located for reference       Expected Outcomes Short Term: Able to state/look up THRR;Long Term: Able to use THRR to govern intensity when exercising independently;Short Term: Able to use daily as guideline for intensity in rehab       Able to check pulse independently Yes       Intervention Provide education and demonstration on how to check pulse in carotid and radial arteries.;Review the importance of being able to check your own pulse for safety during independent exercise       Expected Outcomes Short Term: Able to explain why pulse checking is important during independent exercise       Understanding of Exercise Prescription Yes       Intervention  Provide education, explanation, and written materials on patient's individual exercise prescription       Expected Outcomes Short Term: Able to explain program exercise prescription;Long Term: Able to explain home exercise prescription to exercise independently

## 2024-01-20 NOTE — Progress Notes (Signed)
 Cardiac Individual Treatment Plan  Patient Details  Name: Marcus Huang MRN: [id number] Date of Birth: [birth year] Referring Provider:   Flowsheet Row Cardiac Rehab from 01/20/2024 in [facility] Cardiac and Pulmonary Rehab  Referring Provider Dr. Percival Brace       Initial Encounter Date:  Flowsheet Row Cardiac Rehab from 01/20/2024 in [facility] Cardiac and Pulmonary Rehab  Date 01/20/24       Visit Diagnosis: S/P CABG x 3  Patient's Home Medications on Admission:  Current Outpatient Medications:    acetaminophen  (TYLENOL ) 325 MG tablet, Take 2 tablets (650 mg total) by mouth every 6 (six) hours as needed for mild pain (pain score 1-3)., Disp: , Rfl:    amiodarone  (PACERONE ) 200 MG tablet, Take 2 tablets by mouth twice per day for 7 days, then take 1 tablet twice per day for 7 days, then take 1 tablet daily thereafter, Disp: 60 tablet, Rfl: 1   amiodarone  (PACERONE ) 200 MG tablet, 200 mg., Disp: , Rfl:    amLODipine  (NORVASC ) 10 MG tablet, Take 1 tablet (10 mg total) by mouth daily. (Patient not taking: Reported on 01/18/2024), Disp: 30 tablet, Rfl: 1   amLODipine  (NORVASC ) 10 MG tablet, Take 1 tablet by mouth daily., Disp: , Rfl:    aspirin  EC 325 MG tablet, Take 1 tablet (325 mg total) by mouth daily. (Patient not taking: Reported on 01/18/2024), Disp: , Rfl:    aspirin  EC 81 MG tablet, Take 81 mg by mouth daily. Swallow whole. (Patient not taking: Reported on 01/18/2024), Disp: , Rfl:    aspirin  EC 81 MG tablet, Take 81 mg by mouth., Disp: , Rfl:    Continuous Glucose Sensor (FREESTYLE LIBRE 3 PLUS SENSOR) MISC, FOLLOW PACKAGE DIRECTIONS CHANGE EVERY 15 DAYS, Disp: , Rfl:    lisinopril  (ZESTRIL ) 5 MG tablet, Take 1 tablet (5 mg total) by mouth daily., Disp: 30 tablet, Rfl: 1   metoprolol  tartrate (LOPRESSOR ) 50 MG tablet, Take 1 tablet (50 mg total) by mouth 2 (two) times daily., Disp: 60 tablet, Rfl: 1   metoprolol  tartrate (LOPRESSOR ) 50 MG tablet, Take 1 tablet by mouth 2 (two) times daily.  (Patient not taking: Reported on 01/18/2024), Disp: , Rfl:    Omega-3 Fatty Acids (OMEGA 3 500 PO), Take 500 mg by mouth daily., Disp: , Rfl:    ONETOUCH ULTRA TEST test strip, 1 each 3 (three) times daily., Disp: , Rfl:    oxyCODONE  (OXY IR/ROXICODONE ) 5 MG immediate release tablet, Take 1 tablet (5 mg total) by mouth every 6 (six) hours as needed for severe pain (pain score 7-10)., Disp: 28 tablet, Rfl: 0   PREVIDENT 5000 SENSITIVE 1.1-5 % GEL, Place 1 Application onto teeth 2 (two) times daily., Disp: , Rfl:    REPATHA SURECLICK 140 MG/ML SOAJ, Inject 1 mL into the skin every 14 (fourteen) days., Disp: , Rfl:   Past Medical History: Past Medical History:  Diagnosis Date   Carotid artery thrombosis    Left Carotid Artery   Coronary artery disease    Diabetes (HCC)    Diverticulosis    History of kidney stones    Hyperlipidemia    Hypertension    Renal mass    Sleep apnea    C-PAP    Tobacco Use: Social History   Tobacco Use  Smoking Status Never  Smokeless Tobacco Never    Labs: Review Flowsheet       Latest Ref Rng & Units 12/08/2023 12/10/2023 12/12/2023  Labs for ITP Cardiac and  Pulmonary Rehab  Hemoglobin A1c 4.8 - 5.6 % 5.6  - -  PH, Arterial 7.35 - 7.45 - 7.317  7.356  7.386  7.419  7.416  7.298  7.387  7.376   PCO2 arterial 32 - 48 mmHg - 43.3  41.6  37.1  33.4  35.2  51.0  39.0  35.8   Bicarbonate 20.0 - 28.0 mmol/L - 22.0  23.2  22.7  21.6  22.6  23.1  25.0  23.5  20.9   TCO2 22 - 32 mmol/L - 23  24  24  21  23  23  24  25  24  27  25  24  25  22    Acid-base deficit 0.0 - 2.0 mmol/L - 4.0  2.0  3.0  3.0  2.0  3.0  2.0  1.0  4.0   O2 Saturation % - 93  93  97  100  100  87  100  100  91     Details       Multiple values from one day are sorted in reverse-chronological order          Exercise Target Goals: Exercise Program Goal: Individual exercise prescription set using results from initial 6 min walk test and THRR while considering  patient's activity  barriers and safety.   Exercise Prescription Goal: Initial exercise prescription builds to 30-45 minutes a day of aerobic activity, 2-3 days per week.  Home exercise guidelines will be given to patient during program as part of exercise prescription that the participant will acknowledge.   Education: Aerobic Exercise: - Group verbal and visual presentation on the components of exercise prescription. Introduces F.I.T.T principle from ACSM for exercise prescriptions.  Reviews F.I.T.T. principles of aerobic exercise including progression. Written material given at graduation. Flowsheet Row Cardiac Rehab from 01/20/2024 in [facility] Cardiac and Pulmonary Rehab  Education need identified 01/20/24       Education: Resistance Exercise: - Group verbal and visual presentation on the components of exercise prescription. Introduces F.I.T.T principle from ACSM for exercise prescriptions  Reviews F.I.T.T. principles of resistance exercise including progression. Written material given at graduation. Flowsheet Row Cardiac Rehab from 11/06/2022 in [facility] Cardiac and Pulmonary Rehab  Date 10/09/22  Educator [facility]  Instruction Review Code 1- Verbalizes Understanding        Education: Exercise & Equipment Safety: - Individual verbal instruction and demonstration of equipment use and safety with use of the equipment. Flowsheet Row Cardiac Rehab from 01/20/2024 in [facility] Cardiac and Pulmonary Rehab  Date 01/18/24  Educator jh  Instruction Review Code 1- Verbalizes Understanding       Education: Exercise Physiology & General Exercise Guidelines: - Group verbal and written instruction with models to review the exercise physiology of the cardiovascular system and associated critical values. Provides general exercise guidelines with specific guidelines to those with heart or lung disease.    Education: Flexibility, Balance, Mind/Body Relaxation: - Group verbal and visual presentation with interactive activity on the  components of exercise prescription. Introduces F.I.T.T principle from ACSM for exercise prescriptions. Reviews F.I.T.T. principles of flexibility and balance exercise training including progression. Also discusses the mind body connection.  Reviews various relaxation techniques to help reduce and manage stress (i.e. Deep breathing, progressive muscle relaxation, and visualization). Balance handout provided to take home. Written material given at graduation. Flowsheet Row Cardiac Rehab from 11/06/2022 in [facility] Cardiac and Pulmonary Rehab  Date 10/16/22  Educator [facility]  Instruction Review Code 1- Verbalizes Understanding  Activity Barriers & Risk Stratification:  Activity Barriers & Cardiac Risk Stratification - 01/20/24 8413       Activity Barriers & Cardiac Risk Stratification   Activity Barriers None;Other (comment)    Comments lifting restrictions <10 lb    Cardiac Risk Stratification High             6 Minute Walk:  6 Minute Walk     Row Name 01/20/24 0924         6 Minute Walk   Phase Initial     Distance 1560 feet     Walk Time 6 minutes     # of Rest Breaks 0     MPH 2.95     METS 3.32     RPE 8     Perceived Dyspnea  0     VO2 Peak 11.63     Symptoms No     Resting HR 46 bpm     Resting BP 124/64     Resting Oxygen Saturation  100 %     Exercise Oxygen Saturation  during 6 min walk 99 %     Max Ex. HR 72 bpm     Max Ex. BP 148/66     2 Minute Post BP 140/70              Oxygen Initial Assessment:   Oxygen Re-Evaluation:   Oxygen Discharge (Final Oxygen Re-Evaluation):   Initial Exercise Prescription:  Initial Exercise Prescription - 01/20/24 0900       Date of Initial Exercise RX and Referring Provider   Date 01/20/24    Referring Provider Dr. Percival Brace      Oxygen   Maintain Oxygen Saturation 88% or higher      Treadmill   MPH 2.8    Grade 1    Minutes 15    METs 3.53      Elliptical   Level 1    Speed 3     Minutes 15    METs 3.32      REL-XR   Level 3    Speed 50    Minutes 15    METs 3.32      Prescription Details   Frequency (times per week) 2    Duration Progress to 30 minutes of continuous aerobic without signs/symptoms of physical distress      Intensity   THRR 40-80% of Max Heartrate 87-129    Ratings of Perceived Exertion 11-13    Perceived Dyspnea 0-4      Progression   Progression Continue to progress workloads to maintain intensity without signs/symptoms of physical distress.      Resistance Training   Training Prescription Yes    Weight 4 lb    Reps 10-15             Perform Capillary Blood Glucose checks as needed.  Exercise Prescription Changes:   Exercise Prescription Changes     Row Name 01/20/24 0900             Response to Exercise   Blood Pressure (Admit) 124/64       Blood Pressure (Exercise) 148/66       Blood Pressure (Exit) 140/70       Heart Rate (Admit) 46 bpm       Heart Rate (Exercise) 72 bpm       Heart Rate (Exit) 48 bpm       Oxygen Saturation (Admit) 100 %  Oxygen Saturation (Exercise) 99 %       Rating of Perceived Exertion (Exercise) 8       Perceived Dyspnea (Exercise) 0       Symptoms none       Comments Results                Exercise Comments:   Exercise Goals and Review:   Exercise Goals     Row Name 01/20/24 0923             Exercise Goals   Increase Physical Activity Yes       Intervention Provide advice, education, support and counseling about physical activity/exercise needs.;Develop an individualized exercise prescription for aerobic and resistive training based on initial evaluation findings, risk stratification, comorbidities and participant's personal goals.       Expected Outcomes Short Term: Attend rehab on a regular basis to increase amount of physical activity.;Long Term: Add in home exercise to make exercise part of routine and to increase amount of physical activity.;Long Term:  Exercising regularly at least 3-5 days a week.       Increase Strength and Stamina Yes       Intervention Provide advice, education, support and counseling about physical activity/exercise needs.;Develop an individualized exercise prescription for aerobic and resistive training based on initial evaluation findings, risk stratification, comorbidities and participant's personal goals.       Expected Outcomes Short Term: Increase workloads from initial exercise prescription for resistance, speed, and METs.;Short Term: Perform resistance training exercises routinely during rehab and add in resistance training at home;Long Term: Improve cardiorespiratory fitness, muscular endurance and strength as measured by increased METs and functional capacity ( )       Able to understand and use rate of perceived exertion (RPE) scale Yes       Intervention Provide education and explanation on how to use RPE scale       Expected Outcomes Short Term: Able to use RPE daily in rehab to express subjective intensity level;Long Term:  Able to use RPE to guide intensity level when exercising independently       Able to understand and use Dyspnea scale Yes       Intervention Provide education and explanation on how to use Dyspnea scale       Expected Outcomes Short Term: Able to use Dyspnea scale daily in rehab to express subjective sense of shortness of breath during exertion;Long Term: Able to use Dyspnea scale to guide intensity level when exercising independently       Knowledge and understanding of Target Heart Rate Range (THRR) Yes       Intervention Provide education and explanation of THRR including how the numbers were predicted and where they are located for reference       Expected Outcomes Short Term: Able to state/look up THRR;Long Term: Able to use THRR to govern intensity when exercising independently;Short Term: Able to use daily as guideline for intensity in rehab       Able to check pulse independently Yes        Intervention Provide education and demonstration on how to check pulse in carotid and radial arteries.;Review the importance of being able to check your own pulse for safety during independent exercise       Expected Outcomes Short Term: Able to explain why pulse checking is important during independent exercise       Understanding of Exercise Prescription Yes       Intervention Provide education,  explanation, and written materials on patient's individual exercise prescription       Expected Outcomes Short Term: Able to explain program exercise prescription;Long Term: Able to explain home exercise prescription to exercise independently                Exercise Goals Re-Evaluation :   Discharge Exercise Prescription (Final Exercise Prescription Changes):  Exercise Prescription Changes - 01/20/24 0900       Response to Exercise   Blood Pressure (Admit) 124/64    Blood Pressure (Exercise) 148/66    Blood Pressure (Exit) 140/70    Heart Rate (Admit) 46 bpm    Heart Rate (Exercise) 72 bpm    Heart Rate (Exit) 48 bpm    Oxygen Saturation (Admit) 100 %    Oxygen Saturation (Exercise) 99 %    Rating of Perceived Exertion (Exercise) 8    Perceived Dyspnea (Exercise) 0    Symptoms none    Comments Results             Nutrition:  Target Goals: Understanding of nutrition guidelines, daily intake of sodium 1500mg , cholesterol 200mg , calories 30% from fat and 7% or less from saturated fats, daily to have 5 or more servings of fruits and vegetables.  Education: All About Nutrition: -Group instruction provided by verbal, written material, interactive activities, discussions, models, and posters to present general guidelines for heart healthy nutrition including fat, fiber, MyPlate, the role of sodium in heart healthy nutrition, utilization of the nutrition label, and utilization of this knowledge for meal planning. Follow up email sent as well. Written material given at  graduation. Flowsheet Row Cardiac Rehab from 11/06/2022 in [facility] Cardiac and Pulmonary Rehab  Education need identified 09/17/22       Biometrics:  Pre Biometrics - 01/20/24 0923       Pre Biometrics   Height 5' 7.5" (1.715 m)    Weight 162 lb 4.8 oz (73.6 kg)    Waist Circumference 35 inches    Hip Circumference 37.5 inches    Waist to Hip Ratio 0.93 %    BMI (Calculated) 25.03    Single Leg Stand 30 seconds              Nutrition Therapy Plan and Nutrition Goals:  Nutrition Therapy & Goals - 01/20/24 0920       Nutrition Therapy   RD appointment deferred Yes      Intervention Plan   Intervention Prescribe, educate and counsel regarding individualized specific dietary modifications aiming towards targeted core components such as weight, hypertension, lipid management, diabetes, heart failure and other comorbidities.    Expected Outcomes Short Term Goal: Understand basic principles of dietary content, such as calories, fat, sodium, cholesterol and nutrients.;Short Term Goal: A plan has been developed with personal nutrition goals set during dietitian appointment.;Long Term Goal: Adherence to prescribed nutrition plan.             Nutrition Assessments:  MEDIFICTS Score Key: >=70 Need to make dietary changes  40-70 Heart Healthy Diet <= 40 Therapeutic Level Cholesterol Diet  Flowsheet Row Cardiac Rehab from 01/20/2024 in [facility] Cardiac and Pulmonary Rehab  Picture Your Plate Total Score on Admission 64      Picture Your Plate Scores: <40 Unhealthy dietary pattern with much room for improvement. 41-50 Dietary pattern unlikely to meet recommendations for good health and room for improvement. 51-60 More healthful dietary pattern, with some room for improvement.  >60 Healthy dietary pattern, although there may be some  specific behaviors that could be improved.    Nutrition Goals Re-Evaluation:   Nutrition Goals Discharge (Final Nutrition Goals  Re-Evaluation):   Psychosocial: Target Goals: Acknowledge presence or absence of significant depression and/or stress, maximize coping skills, provide positive support system. Participant is able to verbalize types and ability to use techniques and skills needed for reducing stress and depression.   Education: Stress, Anxiety, and Depression - Group verbal and visual presentation to define topics covered.  Reviews how body is impacted by stress, anxiety, and depression.  Also discusses healthy ways to reduce stress and to treat/manage anxiety and depression.  Written material given at graduation.   Education: Sleep Hygiene -Provides group verbal and written instruction about how sleep can affect your health.  Define sleep hygiene, discuss sleep cycles and impact of sleep habits. Review good sleep hygiene tips.    Initial Review & Psychosocial Screening:  Initial Psych Review & Screening - 01/18/24 1340       Initial Review   Current issues with None Identified      Family Dynamics   Good Support System? Yes    Comments He can look to his friends and family for support. 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      Barriers   Psychosocial barriers to participate in program There are no identifiable barriers or psychosocial needs.;The patient should benefit from training in stress management and relaxation.      Screening Interventions   Interventions Encouraged to exercise;Provide feedback about the scores to participant;To provide support and resources with identified psychosocial needs    Expected Outcomes Short Term goal: Utilizing psychosocial counselor, staff and physician to assist with identification of specific Stressors or current issues interfering with healing process. Setting desired goal for each stressor or current issue identified.;Long Term Goal: Stressors or current issues are controlled or eliminated.;Long Term goal: The  participant improves quality of Life and PHQ9 Scores as seen by post scores and/or verbalization of changes;Short Term goal: Identification and review with participant of any Quality of Life or Depression concerns found by scoring the questionnaire.             Quality of Life Scores:   Quality of Life - 01/20/24 0919       Quality of Life   Select Quality of Life      Quality of Life Scores   Health/Function Pre 17.5 %    Socioeconomic Pre 21.63 %    Psych/Spiritual Pre 22.5 %    Family Pre 19.6 %    GLOBAL Pre 19.74 %            Scores of 19 and below usually indicate a poorer quality of life in these areas.  A difference of  2-3 points is a clinically meaningful difference.  A difference of 2-3 points in the total score of the Quality of Life Index has been associated with significant improvement in overall quality of life, self-image, physical symptoms, and general health in studies assessing change in quality of life.  PHQ-9: Review Flowsheet       01/20/2024 11/06/2022 10/09/2022 09/17/2022  Depression screen PHQ 2/9  Decreased Interest 1 0 0 0  Down, Depressed, Hopeless 1 0 1 1  PHQ - 2 Score 2 0 1 1  Altered sleeping 2 0 1 3  Tired, decreased energy 2 1 1 1   Change in appetite 0 0 0 0  Feeling bad or failure about  yourself  0 0 1 1  Trouble concentrating 1 1 1  0  Moving slowly or fidgety/restless 0 1 0 1  Suicidal thoughts 0 0 0 0  PHQ-9 Score 7 3 5 7   Difficult doing work/chores Not difficult at all Not difficult at all Somewhat difficult Not difficult at all   Interpretation of Total Score  Total Score Depression Severity:  1-4 = Minimal depression, 5-9 = Mild depression, 10-14 = Moderate depression, 15-19 = Moderately severe depression, 20-27 = Severe depression   Psychosocial Evaluation and Intervention:  Psychosocial Evaluation - 01/18/24 1341       Psychosocial Evaluation & Interventions   Interventions Encouraged to exercise with the program and  follow exercise prescription;Relaxation education;Stress management education    Comments He can look to his friends and family for support. 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    Expected Outcomes Short: Start Heart[facility]ac[facility] to help with mood. Long: Maintain a healthy mental state    Continue Psychosocial Services  Follow up required by staff             Psychosocial Re-Evaluation:   Psychosocial Discharge (Final Psychosocial Re-Evaluation):   Vocational Rehabilitation: Provide vocational rehab assistance to qualifying candidates.   Vocational Rehab Evaluation & Intervention:   Education: Education Goals: Education classes will be provided on a variety of topics geared toward better understanding of heart health and risk factor modification. Participant will state understanding/return demonstration of topics presented as noted by education test scores.  Learning Barriers/Preferences:  Learning Barriers/Preferences - 01/18/24 1340       Learning Barriers/Preferences   Learning Barriers None    Learning Preferences None             General Cardiac Education Topics:  AED/CPR: - Group verbal and written instruction with the use of models to demonstrate the basic use of the AED with the basic ABC's of resuscitation.   Anatomy and Cardiac Procedures: - Group verbal and visual presentation and models provide information about basic cardiac anatomy and function. Reviews the testing methods done to diagnose heart disease and the outcomes of the test results. Describes the treatment choices: Medical Management, Angioplasty, or Coronary Bypass Surgery for treating various heart conditions including Myocardial Infarction, Angina, Valve Disease, and Cardiac Arrhythmias.  Written material given at graduation. Flowsheet Row Cardiac Rehab from 01/20/2024 in [facility] Cardiac and Pulmonary Rehab  Education need identified 01/20/24        Medication Safety: - Group verbal and visual instruction to review commonly prescribed medications for heart and lung disease. Reviews the medication, class of the drug, and side effects. Includes the steps to properly store meds and maintain the prescription regimen.  Written material given at graduation. Flowsheet Row Cardiac Rehab from 11/06/2022 in [facility] Cardiac and Pulmonary Rehab  Date 11/06/22  Educator [facility]  Instruction Review Code 1- Verbalizes Understanding       Intimacy: - Group verbal instruction through game format to discuss how heart and lung disease can affect sexual intimacy. Written material given at graduation..   Know Your Numbers and Heart Failure: - Group verbal and visual instruction to discuss disease risk factors for cardiac and pulmonary disease and treatment options.  Reviews associated critical values for Overweight/Obesity, Hypertension, Cholesterol, and Diabetes.  Discusses basics of heart failure: signs/symptoms and treatments.  Introduces Heart Failure Zone chart for action plan for heart failure.  Written material given at graduation. Flowsheet Row Cardiac Rehab from 01/20/2024  in [facility] Cardiac and Pulmonary Rehab  Education need identified 01/20/24       Infection Prevention: - Provides verbal and written material to individual with discussion of infection control including proper hand washing and proper equipment cleaning during exercise session. Flowsheet Row Cardiac Rehab from 01/20/2024 in [facility] Cardiac and Pulmonary Rehab  Date 01/18/24  Educator jh  Instruction Review Code 1- Verbalizes Understanding       Falls Prevention: - Provides verbal and written material to individual with discussion of falls prevention and safety. Flowsheet Row Cardiac Rehab from 01/20/2024 in [facility] Cardiac and Pulmonary Rehab  Date 01/18/24  Educator jh  Instruction Review Code 1- Verbalizes Understanding       Other: -Provides group and verbal instruction on  various topics (see comments) Flowsheet Row Cardiac Rehab from 11/06/2022 in [facility] Cardiac and Pulmonary Rehab  Date 10/30/22  Educator SB  Instruction Review Code 1- Verbalizes Understanding       Knowledge Questionnaire Score:  Knowledge Questionnaire Score - 01/20/24 0919       Knowledge Questionnaire Score   Pre Score 23/26             Core Components/Risk Factors/Patient Goals at Admission:  Personal Goals and Risk Factors at Admission - 01/18/24 1339       Core Components/Risk Factors/Patient Goals on Admission    Weight Management Yes;Weight Maintenance    Intervention Weight Management: Develop a combined nutrition and exercise program designed to reach desired caloric intake, while maintaining appropriate intake of nutrient and fiber, sodium and fats, and appropriate energy expenditure required for the weight goal.;Weight Management: Provide education and appropriate resources to help participant work on and attain dietary goals.;Weight Management/Obesity: Establish reasonable short term and long term weight goals.    Expected Outcomes Short Term: Continue to assess and modify interventions until short term weight is achieved;Weight Maintenance: Understanding of the daily nutrition guidelines, which includes 25-35% calories from fat, 7% or less cal from saturated fats, less than 200mg  cholesterol, less than 1.5gm of sodium, & 5 or more servings of fruits and vegetables daily;Understanding recommendations for meals to include 15-35% energy as protein, 25-35% energy from fat, 35-60% energy from carbohydrates, less than 200mg  of dietary cholesterol, 20-35 gm of total fiber daily;Understanding of distribution of calorie intake throughout the day with the consumption of 4-5 meals/snacks    Diabetes Yes    Intervention Provide education about signs/symptoms and action to take for hypo/hyperglycemia.;Provide education about proper nutrition, including hydration, and aerobic/resistive  exercise prescription along with prescribed medications to achieve blood glucose in normal ranges: Fasting glucose 65-99 mg/dL    Expected Outcomes Short Term: Participant verbalizes understanding of the signs/symptoms and immediate care of hyper/hypoglycemia, proper foot care and importance of medication, aerobic/resistive exercise and nutrition plan for blood glucose control.;Long Term: Attainment of HbA1C < 7%.    Hypertension Yes    Intervention Provide education on lifestyle modifcations including regular physical activity/exercise, weight management, moderate sodium restriction and increased consumption of fresh fruit, vegetables, and low fat dairy, alcohol moderation, and smoking cessation.;Monitor prescription use compliance.    Expected Outcomes Short Term: Continued assessment and intervention until BP is < 140/58mm HG in hypertensive participants. < 130/67mm HG in hypertensive participants with diabetes, heart failure or chronic kidney disease.;Long Term: Maintenance of blood pressure at goal levels.             Education:Diabetes - Individual verbal and written instruction to review signs/symptoms of diabetes, desired ranges of glucose level  fasting, after meals and with exercise. Acknowledge that pre and post exercise glucose checks will be done for 3 sessions at entry of program. Flowsheet Row Cardiac Rehab from 01/20/2024 in [facility] Cardiac and Pulmonary Rehab  Date 01/18/24  Educator jh  Instruction Review Code 1- Verbalizes Understanding       Core Components/Risk Factors/Patient Goals Review:    Core Components/Risk Factors/Patient Goals at Discharge (Final Review):    ITP Comments:  ITP Comments     Row Name 01/18/24 1339 01/20/24 0917         ITP Comments Virtual Visit completed. Patient informed on EP and RD appointment and 6 Minute walk test. Patient also informed of patient health questionnaires on My Chart. Patient Verbalizes understanding. Visit diagnosis can be  found in [facility] 12/10/[facility]23. Completed and gym orientation for cardiac rehab. Initial ITP created and sent for review to Dr. Firman Hughes, Medical Director.               Comments: Initial ITP

## 2024-01-26 ENCOUNTER — Encounter

## 2024-01-28 ENCOUNTER — Encounter: Admitting: *Deleted

## 2024-01-28 DIAGNOSIS — Z951 Presence of aortocoronary bypass graft: Secondary | ICD-10-CM

## 2024-01-28 DIAGNOSIS — Z48812 Encounter for surgical aftercare following surgery on the circulatory system: Secondary | ICD-10-CM | POA: Diagnosis not present

## 2024-01-28 LAB — GLUCOSE, CAPILLARY
Glucose-Capillary: 89 mg/dL (ref 70–99)
Glucose-Capillary: 101 mg/dL — ABNORMAL HIGH (ref 70–99)

## 2024-01-28 NOTE — Progress Notes (Signed)
 Daily Session Note  Patient Details  Name: Marcus Huang MRN: [id number] Date of Birth: 04-29-1951 Referring Provider:   Flowsheet Row Cardiac Rehab from 01/20/2024 in [facility] Cardiac and Pulmonary Rehab  Referring Provider Dr. Percival Brace       Encounter Date: 01/28/2024  Check In:  Session Check In - 01/28/24 1028       Check-In   Supervising physician immediately available to respond to emergencies See telemetry face sheet for immediately available ER MD    Location [facility]-Cardiac & Pulmonary Rehab    Staff Present Maud Sorenson, RN, BSN, CCRP;Joseph Hood RCP,RRT,BSRT;Margaret Best, MS, Exercise Physiologist;Maxon Conetta BS, Exercise Physiologist    Virtual Visit No    Medication changes reported     No    Fall or balance concerns reported    No    Warm-up and Cool-down Performed on first and last piece of equipment    Resistance Training Performed Yes    VAD Patient? No    PAD/SET Patient? No      Pain Assessment   Currently in Pain? No/denies                Social History   Tobacco Use  Smoking Status Never  Smokeless Tobacco Never    Goals Met:  Exercise tolerated well Personal goals reviewed Strength training completed today  Goals Unmet:  Not Applicable  Comments: Pt able to follow exercise prescription today without complaint.  Will continue to monitor for progression.  First full day of exercise!  Patient was oriented to gym and equipment including functions, settings, policies, and procedures.  Patient's individual exercise prescription and treatment plan were reviewed.  All starting workloads were established based on the results of the 6 minute walk test done at initial orientation visit.  The plan for exercise progression was also introduced and progression will be customized based on patient's performance and goals.   Dr. Firman Hughes is Medical Director for [facility] Cardiac Rehabilitation.  Dr. Fuad Aleskerov is Medical Director for  [facility] Pulmonary Rehabilitation.

## 2024-02-02 ENCOUNTER — Encounter: Admitting: *Deleted

## 2024-02-02 ENCOUNTER — Encounter (INDEPENDENT_AMBULATORY_CARE_PROVIDER_SITE_OTHER): Payer: Self-pay

## 2024-02-02 DIAGNOSIS — Z951 Presence of aortocoronary bypass graft: Secondary | ICD-10-CM

## 2024-02-02 DIAGNOSIS — Z48812 Encounter for surgical aftercare following surgery on the circulatory system: Secondary | ICD-10-CM | POA: Diagnosis not present

## 2024-02-02 NOTE — Progress Notes (Signed)
 Daily Session Note  Patient Details  Name: Marcus Huang MRN: [id number] Date of Birth: [birth year] Referring Provider:   Flowsheet Row Cardiac Rehab from 01/20/2024 in [facility] Cardiac and Pulmonary Rehab  Referring Provider Dr. Percival Brace       Encounter Date: 02/02/2024  Check In:  Session Check In - 02/02/24 0943       Check-In   Supervising physician immediately available to respond to emergencies See telemetry face sheet for immediately available ER MD    Location [facility]-Cardiac & Pulmonary Rehab    Staff Present Maud Sorenson, RN, BSN, CCRP;Maxon Conetta BS, Exercise Physiologist;Noah Tickle, BS, Exercise Physiologist;Jason Martina Sledge RDN,LDN    Virtual Visit No    Medication changes reported     No    Fall or balance concerns reported    No    Warm-up and Cool-down Performed on first and last piece of equipment    Resistance Training Performed Yes    VAD Patient? No    PAD/SET Patient? No      Pain Assessment   Currently in Pain? No/denies                Social History   Tobacco Use  Smoking Status Never  Smokeless Tobacco Never    Goals Met:  Independence with exercise equipment Exercise tolerated well No report of concerns or symptoms today  Goals Unmet:  Not Applicable  Comments: Pt able to follow exercise prescription today without complaint.  Will continue to monitor for progression.    Dr. Firman Hughes is Medical Director for [facility] Cardiac Rehabilitation.  Dr. Fuad Aleskerov is Medical Director for [facility] Pulmonary Rehabilitation.

## 2024-02-04 ENCOUNTER — Encounter: Admitting: *Deleted

## 2024-02-04 DIAGNOSIS — Z48812 Encounter for surgical aftercare following surgery on the circulatory system: Secondary | ICD-10-CM | POA: Diagnosis not present

## 2024-02-04 DIAGNOSIS — Z951 Presence of aortocoronary bypass graft: Secondary | ICD-10-CM

## 2024-02-04 NOTE — Progress Notes (Signed)
 Daily Session Note  Patient Details  Name: Marcus Huang MRN: [id number] Date of Birth: 1951/06/03 Referring Provider:   Flowsheet Row Cardiac Rehab from 01/20/2024 in [facility] Cardiac and Pulmonary Rehab  Referring Provider Dr. Percival Brace       Encounter Date: 02/04/2024  Check In:  Session Check In - 02/04/24 0956       Check-In   Supervising physician immediately available to respond to emergencies See telemetry face sheet for immediately available ER MD    Location [facility]-Cardiac & Pulmonary Rehab    Staff Present Maud Sorenson, RN, BSN, CCRP;Joseph Hood RCP,RRT,BSRT;Maxon Albany BS, Exercise Physiologist;Margaret Best, MS, Exercise Physiologist    Virtual Visit No    Medication changes reported     No    Fall or balance concerns reported    No    Warm-up and Cool-down Performed on first and last piece of equipment    Resistance Training Performed Yes    VAD Patient? No    PAD/SET Patient? No      Pain Assessment   Currently in Pain? No/denies                Social History   Tobacco Use  Smoking Status Never  Smokeless Tobacco Never    Goals Met:  Independence with exercise equipment Exercise tolerated well No report of concerns or symptoms today  Goals Unmet:  Not Applicable  Comments: Pt able to follow exercise prescription today without complaint.  Will continue to monitor for progression.    Dr. Firman Hughes is Medical Director for [facility] Cardiac Rehabilitation.  Dr. Fuad Aleskerov is Medical Director for [facility] Pulmonary Rehabilitation.

## 2024-02-09 ENCOUNTER — Encounter: Admitting: *Deleted

## 2024-02-09 DIAGNOSIS — Z48812 Encounter for surgical aftercare following surgery on the circulatory system: Secondary | ICD-10-CM | POA: Diagnosis not present

## 2024-02-09 DIAGNOSIS — Z951 Presence of aortocoronary bypass graft: Secondary | ICD-10-CM

## 2024-02-09 NOTE — Progress Notes (Signed)
 Daily Session Note  Patient Details  Name: Marcus Huang MRN: [id number] Date of Birth: 09/04/1951 Referring Provider:   Flowsheet Row Cardiac Rehab from 01/20/2024 in [facility] Cardiac and Pulmonary Rehab  Referring Provider Dr. Percival Brace       Encounter Date: 02/09/2024  Check In:  Session Check In - 02/09/24 0944       Check-In   Supervising physician immediately available to respond to emergencies See telemetry face sheet for immediately available ER MD    Location [facility]-Cardiac & Pulmonary Rehab    Staff Present Maud Sorenson, RN, BSN, CCRP;Maxon Conetta BS, Exercise Physiologist;Noah Tickle, BS, Exercise Physiologist;Jason Martina Sledge RDN,LDN    Virtual Visit No    Medication changes reported     No    Fall or balance concerns reported    No    Warm-up and Cool-down Performed on first and last piece of equipment    Resistance Training Performed Yes    VAD Patient? No    PAD/SET Patient? No      Pain Assessment   Currently in Pain? No/denies                Social History   Tobacco Use  Smoking Status Never  Smokeless Tobacco Never    Goals Met:  Independence with exercise equipment Exercise tolerated well No report of concerns or symptoms today  Goals Unmet:  Not Applicable  Comments: Pt able to follow exercise prescription today without complaint.  Will continue to monitor for progression.    Dr. Firman Hughes is Medical Director for [facility] Cardiac Rehabilitation.  Dr. Fuad Aleskerov is Medical Director for [facility] Pulmonary Rehabilitation.

## 2024-02-11 ENCOUNTER — Encounter: Admitting: *Deleted

## 2024-02-11 DIAGNOSIS — Z951 Presence of aortocoronary bypass graft: Secondary | ICD-10-CM

## 2024-02-11 DIAGNOSIS — Z48812 Encounter for surgical aftercare following surgery on the circulatory system: Secondary | ICD-10-CM | POA: Diagnosis not present

## 2024-02-11 NOTE — Progress Notes (Signed)
 Daily Session Note  Patient Details  Name: Marcus Huang MRN: [id number] Date of Birth: [birth year] Referring Provider:   Flowsheet Row Cardiac Rehab from 01/20/2024 in [facility] Cardiac and Pulmonary Rehab  Referring Provider Dr. Percival Brace       Encounter Date: 02/11/2024  Check In:  Session Check In - 02/11/24 0956       Check-In   Supervising physician immediately available to respond to emergencies See telemetry face sheet for immediately available ER MD    Location [facility]-Cardiac & Pulmonary Rehab    Staff Present Maud Sorenson, RN, BSN, CCRP;Margaret Best, MS, Exercise Physiologist;Maxon Conetta BS, Exercise Physiologist;Joseph Lacinda Pica RCP,RRT,BSRT    Virtual Visit No    Medication changes reported     No    Fall or balance concerns reported    No    Warm-up and Cool-down Performed on first and last piece of equipment    Resistance Training Performed Yes    VAD Patient? No    PAD/SET Patient? No      Pain Assessment   Currently in Pain? No/denies                Social History   Tobacco Use  Smoking Status Never  Smokeless Tobacco Never    Goals Met:  Independence with exercise equipment Exercise tolerated well No report of concerns or symptoms today  Goals Unmet:  Not Applicable  Comments: Pt able to follow exercise prescription today without complaint.  Will continue to monitor for progression.    Dr. Firman Hughes is Medical Director for [facility] Cardiac Rehabilitation.  Dr. Fuad Aleskerov is Medical Director for [facility] Pulmonary Rehabilitation.

## 2024-02-16 ENCOUNTER — Encounter: Attending: Cardiology

## 2024-02-16 DIAGNOSIS — Z951 Presence of aortocoronary bypass graft: Secondary | ICD-10-CM | POA: Insufficient documentation

## 2024-02-16 DIAGNOSIS — Z955 Presence of coronary angioplasty implant and graft: Secondary | ICD-10-CM

## 2024-02-16 NOTE — Progress Notes (Signed)
 Daily Session Note  Patient Details  Name: Marcus Huang MRN: [id number] Date of Birth: Oct 24, 1950 Referring Provider:   Flowsheet Row Cardiac Rehab from 01/20/2024 in [facility] Cardiac and Pulmonary Rehab  Referring Provider Dr. Percival Brace       Encounter Date: 02/16/2024  Check In:  Session Check In - 02/16/24 9147       Check-In   Supervising physician immediately available to respond to emergencies See telemetry face sheet for immediately available ER MD    Location [facility]-Cardiac & Pulmonary Rehab    Staff Present Sherle Dire, BS, Exercise Physiologist;Jason Martina Sledge RDN,LDN;Teighan Aubert RN,BSN,MPA;Maxon Conetta BS, Exercise Physiologist    Virtual Visit No    Medication changes reported     No    Fall or balance concerns reported    No    Tobacco Cessation No Change    Warm-up and Cool-down Performed on first and last piece of equipment    Resistance Training Performed Yes    VAD Patient? No    PAD/SET Patient? No      Pain Assessment   Currently in Pain? No/denies                Social History   Tobacco Use  Smoking Status Never  Smokeless Tobacco Never    Goals Met:  Independence with exercise equipment Exercise tolerated well No report of concerns or symptoms today Strength training completed today  Goals Unmet:  Not Applicable  Comments: Pt able to follow exercise prescription today without complaint.  Will continue to monitor for progression.    Dr. Firman Hughes is Medical Director for [facility] Cardiac Rehabilitation.  Dr. Fuad Aleskerov is Medical Director for [facility] Pulmonary Rehabilitation.

## 2024-02-17 ENCOUNTER — Encounter: Payer: Self-pay | Admitting: *Deleted

## 2024-02-17 DIAGNOSIS — Z951 Presence of aortocoronary bypass graft: Secondary | ICD-10-CM

## 2024-02-17 NOTE — Progress Notes (Signed)
 Cardiac Individual Treatment Plan  Patient Details  Name: Marcus Huang MRN: [id number] Date of Birth: Jul 21, 1951 Referring Provider:   Flowsheet Row Cardiac Rehab from 01/20/2024 in [facility] Cardiac and Pulmonary Rehab  Referring Provider Dr. Percival Brace       Initial Encounter Date:  Flowsheet Row Cardiac Rehab from 01/20/2024 in [facility] Cardiac and Pulmonary Rehab  Date 01/20/24       Visit Diagnosis: S/P CABG x 3  Patient's Home Medications on Admission:  Current Outpatient Medications:    acetaminophen  (TYLENOL ) 325 MG tablet, Take 2 tablets (650 mg total) by mouth every 6 (six) hours as needed for mild pain (pain score 1-3)., Disp: , Rfl:    amiodarone  (PACERONE ) 200 MG tablet, Take 2 tablets by mouth twice per day for 7 days, then take 1 tablet twice per day for 7 days, then take 1 tablet daily thereafter, Disp: 60 tablet, Rfl: 1   amiodarone  (PACERONE ) 200 MG tablet, 200 mg., Disp: , Rfl:    amLODipine  (NORVASC ) 10 MG tablet, Take 1 tablet (10 mg total) by mouth daily. (Patient not taking: Reported on 01/18/2024), Disp: 30 tablet, Rfl: 1   amLODipine  (NORVASC ) 10 MG tablet, Take 1 tablet by mouth daily., Disp: , Rfl:    aspirin  EC 325 MG tablet, Take 1 tablet (325 mg total) by mouth daily. (Patient not taking: Reported on 01/18/2024), Disp: , Rfl:    aspirin  EC 81 MG tablet, Take 81 mg by mouth daily. Swallow whole. (Patient not taking: Reported on 01/18/2024), Disp: , Rfl:    aspirin  EC 81 MG tablet, Take 81 mg by mouth., Disp: , Rfl:    Continuous Glucose Sensor (FREESTYLE LIBRE 3 PLUS SENSOR) MISC, FOLLOW PACKAGE DIRECTIONS CHANGE EVERY 15 DAYS, Disp: , Rfl:    lisinopril  (ZESTRIL ) 5 MG tablet, Take 1 tablet (5 mg total) by mouth daily., Disp: 30 tablet, Rfl: 1   metoprolol  tartrate (LOPRESSOR ) 50 MG tablet, Take 1 tablet (50 mg total) by mouth 2 (two) times daily., Disp: 60 tablet, Rfl: 1   metoprolol  tartrate (LOPRESSOR ) 50 MG tablet, Take 1 tablet by mouth 2 (two) times daily.  (Patient not taking: Reported on 01/18/2024), Disp: , Rfl:    Omega-3 Fatty Acids (OMEGA 3 500 PO), Take 500 mg by mouth daily., Disp: , Rfl:    ONETOUCH ULTRA TEST test strip, 1 each 3 (three) times daily., Disp: , Rfl:    oxyCODONE  (OXY IR/ROXICODONE ) 5 MG immediate release tablet, Take 1 tablet (5 mg total) by mouth every 6 (six) hours as needed for severe pain (pain score 7-10)., Disp: 28 tablet, Rfl: 0   PREVIDENT 5000 SENSITIVE 1.1-5 % GEL, Place 1 Application onto teeth 2 (two) times daily., Disp: , Rfl:    REPATHA SURECLICK 140 MG/ML SOAJ, Inject 1 mL into the skin every 14 (fourteen) days., Disp: , Rfl:   Past Medical History: Past Medical History:  Diagnosis Date   Carotid artery thrombosis    Left Carotid Artery   Coronary artery disease    Diabetes (HCC)    Diverticulosis    History of kidney stones    Hyperlipidemia    Hypertension    Renal mass    Sleep apnea    C-PAP    Tobacco Use: Social History   Tobacco Use  Smoking Status Never  Smokeless Tobacco Never    Labs: Review Flowsheet       Latest Ref Rng & Units 12/08/2023 12/10/2023 12/12/2023  Labs for ITP Cardiac and  Pulmonary Rehab  Hemoglobin A1c 4.8 - 5.6 % 5.6  - -  PH, Arterial 7.35 - 7.45 - 7.317  7.356  7.386  7.419  7.416  7.298  7.387  7.376   PCO2 arterial 32 - 48 mmHg - 43.3  41.6  37.1  33.4  35.2  51.0  39.0  35.8   Bicarbonate 20.0 - 28.0 mmol/L - 22.0  23.2  22.7  21.6  22.6  23.1  25.0  23.5  20.9   TCO2 22 - 32 mmol/L - 23  24  24  21  23  23  24  25  24  27  25  24  25  22    Acid-base deficit 0.0 - 2.0 mmol/L - 4.0  2.0  3.0  3.0  2.0  3.0  2.0  1.0  4.0   O2 Saturation % - 93  93  97  100  100  87  100  100  91     Details       Multiple values from one day are sorted in reverse-chronological order          Exercise Target Goals: Exercise Program Goal: Individual exercise prescription set using results from initial 6 min walk test and THRR while considering  patient's activity  barriers and safety.   Exercise Prescription Goal: Initial exercise prescription builds to 30-45 minutes a day of aerobic activity, 2-3 days per week.  Home exercise guidelines will be given to patient during program as part of exercise prescription that the participant will acknowledge.   Education: Aerobic Exercise: - Group verbal and visual presentation on the components of exercise prescription. Introduces F.I.T.T principle from ACSM for exercise prescriptions.  Reviews F.I.T.T. principles of aerobic exercise including progression. Written material given at graduation. Flowsheet Row Cardiac Rehab from 01/20/2024 in [facility] Cardiac and Pulmonary Rehab  Education need identified 01/20/24       Education: Resistance Exercise: - Group verbal and visual presentation on the components of exercise prescription. Introduces F.I.T.T principle from ACSM for exercise prescriptions  Reviews F.I.T.T. principles of resistance exercise including progression. Written material given at graduation. Flowsheet Row Cardiac Rehab from 11/06/2022 in [facility] Cardiac and Pulmonary Rehab  Date 10/09/22  Educator [facility]  Instruction Review Code 1- Verbalizes Understanding        Education: Exercise & Equipment Safety: - Individual verbal instruction and demonstration of equipment use and safety with use of the equipment. Flowsheet Row Cardiac Rehab from 01/20/2024 in [facility] Cardiac and Pulmonary Rehab  Date 01/18/24  Educator jh  Instruction Review Code 1- Verbalizes Understanding       Education: Exercise Physiology & General Exercise Guidelines: - Group verbal and written instruction with models to review the exercise physiology of the cardiovascular system and associated critical values. Provides general exercise guidelines with specific guidelines to those with heart or lung disease.    Education: Flexibility, Balance, Mind/Body Relaxation: - Group verbal and visual presentation with interactive activity on the  components of exercise prescription. Introduces F.I.T.T principle from ACSM for exercise prescriptions. Reviews F.I.T.T. principles of flexibility and balance exercise training including progression. Also discusses the mind body connection.  Reviews various relaxation techniques to help reduce and manage stress (i.e. Deep breathing, progressive muscle relaxation, and visualization). Balance handout provided to take home. Written material given at graduation. Flowsheet Row Cardiac Rehab from 11/06/2022 in [facility] Cardiac and Pulmonary Rehab  Date 10/16/22  Educator [facility]  Instruction Review Code 1- Verbalizes Understanding  Activity Barriers & Risk Stratification:  Activity Barriers & Cardiac Risk Stratification - 01/20/24 1610       Activity Barriers & Cardiac Risk Stratification   Activity Barriers None;Other (comment)    Comments lifting restrictions <10 lb    Cardiac Risk Stratification High             6 Minute Walk:  6 Minute Walk     Row Name 01/20/24 0924         6 Minute Walk   Phase Initial     Distance 1560 feet     Walk Time 6 minutes     # of Rest Breaks 0     MPH 2.95     METS 3.32     RPE 8     Perceived Dyspnea  0     VO2 Peak 11.63     Symptoms No     Resting HR 46 bpm     Resting BP 124/64     Resting Oxygen Saturation  100 %     Exercise Oxygen Saturation  during 6 min walk 99 %     Max Ex. HR 72 bpm     Max Ex. BP 148/66     2 Minute Post BP 140/70              Oxygen Initial Assessment:   Oxygen Re-Evaluation:   Oxygen Discharge (Final Oxygen Re-Evaluation):   Initial Exercise Prescription:  Initial Exercise Prescription - 01/20/24 0900       Date of Initial Exercise RX and Referring Provider   Date 01/20/24    Referring Provider Dr. Percival Brace      Oxygen   Maintain Oxygen Saturation 88% or higher      Treadmill   MPH 2.8    Grade 1    Minutes 15    METs 3.53      Elliptical   Level 1    Speed 3     Minutes 15    METs 3.32      REL-XR   Level 3    Speed 50    Minutes 15    METs 3.32      Prescription Details   Frequency (times per week) 2    Duration Progress to 30 minutes of continuous aerobic without signs/symptoms of physical distress      Intensity   THRR 40-80% of Max Heartrate 87-129    Ratings of Perceived Exertion 11-13    Perceived Dyspnea 0-4      Progression   Progression Continue to progress workloads to maintain intensity without signs/symptoms of physical distress.      Resistance Training   Training Prescription Yes    Weight 4 lb    Reps 10-15             Perform Capillary Blood Glucose checks as needed.  Exercise Prescription Changes:   Exercise Prescription Changes     Row Name 01/20/24 0900 02/04/24 1400           Response to Exercise   Blood Pressure (Admit) 124/64 110/60      Blood Pressure (Exercise) 148/66 136/64      Blood Pressure (Exit) 140/70 108/58      Heart Rate (Admit) 46 bpm 43 bpm      Heart Rate (Exercise) 72 bpm 107 bpm      Heart Rate (Exit) 48 bpm 50 bpm      Oxygen Saturation (Admit) 100 % --  Oxygen Saturation (Exercise) 99 % --      Rating of Perceived Exertion (Exercise) 8 13      Perceived Dyspnea (Exercise) 0 0      Symptoms none none      Comments Results first two weeks of exercise      Duration -- Progress to 30 minutes of  aerobic without signs/symptoms of physical distress      Intensity -- THRR unchanged        Progression   Progression -- Continue to progress workloads to maintain intensity without signs/symptoms of physical distress.      Average METs -- 4.02        Resistance Training   Training Prescription -- Yes      Weight -- 4lb      Reps -- 10-15        Interval Training   Interval Training -- No        Treadmill   MPH -- 2.9      Grade -- 2      Minutes -- 15      METs -- 4.02        REL-XR   Level -- 5      Minutes -- 15        Oxygen   Maintain Oxygen Saturation  -- 88% or higher               Exercise Comments:   Exercise Comments     Row Name 01/28/24 1029           Exercise Comments irst full day of exercise!  Patient was oriented to gym and equipment including functions, settings, policies, and procedures.  Patient's individual exercise prescription and treatment plan were reviewed.  All starting workloads were established based on the results of the 6 minute walk test done at initial orientation visit.  The plan for exercise progression was also introduced and progression will be customized based on patient's performance and goals.                Exercise Goals and Review:   Exercise Goals     Row Name 01/20/24 0923             Exercise Goals   Increase Physical Activity Yes       Intervention Provide advice, education, support and counseling about physical activity/exercise needs.;Develop an individualized exercise prescription for aerobic and resistive training based on initial evaluation findings, risk stratification, comorbidities and participant's personal goals.       Expected Outcomes Short Term: Attend rehab on a regular basis to increase amount of physical activity.;Long Term: Add in home exercise to make exercise part of routine and to increase amount of physical activity.;Long Term: Exercising regularly at least 3-5 days a week.       Increase Strength and Stamina Yes       Intervention Provide advice, education, support and counseling about physical activity/exercise needs.;Develop an individualized exercise prescription for aerobic and resistive training based on initial evaluation findings, risk stratification, comorbidities and participant's personal goals.       Expected Outcomes Short Term: Increase workloads from initial exercise prescription for resistance, speed, and METs.;Short Term: Perform resistance training exercises routinely during rehab and add in resistance training at home;Long Term: Improve  cardiorespiratory fitness, muscular endurance and strength as measured by increased METs and functional capacity ( )       Able to understand and use rate of perceived exertion (RPE) scale  Yes       Intervention Provide education and explanation on how to use RPE scale       Expected Outcomes Short Term: Able to use RPE daily in rehab to express subjective intensity level;Long Term:  Able to use RPE to guide intensity level when exercising independently       Able to understand and use Dyspnea scale Yes       Intervention Provide education and explanation on how to use Dyspnea scale       Expected Outcomes Short Term: Able to use Dyspnea scale daily in rehab to express subjective sense of shortness of breath during exertion;Long Term: Able to use Dyspnea scale to guide intensity level when exercising independently       Knowledge and understanding of Target Heart Rate Range (THRR) Yes       Intervention Provide education and explanation of THRR including how the numbers were predicted and where they are located for reference       Expected Outcomes Short Term: Able to state/look up THRR;Long Term: Able to use THRR to govern intensity when exercising independently;Short Term: Able to use daily as guideline for intensity in rehab       Able to check pulse independently Yes       Intervention Provide education and demonstration on how to check pulse in carotid and radial arteries.;Review the importance of being able to check your own pulse for safety during independent exercise       Expected Outcomes Short Term: Able to explain why pulse checking is important during independent exercise       Understanding of Exercise Prescription Yes       Intervention Provide education, explanation, and written materials on patient's individual exercise prescription       Expected Outcomes Short Term: Able to explain program exercise prescription;Long Term: Able to explain home exercise prescription to exercise  independently                Exercise Goals Re-Evaluation :  Exercise Goals Re-Evaluation     Row Name 01/28/24 1029 02/04/24 1424 02/11/24 0941         Exercise Goal Re-Evaluation   Exercise Goals Review -- Increase Physical Activity;Increase Strength and Stamina;Understanding of Exercise Prescription Increase Physical Activity;Increase Strength and Stamina;Understanding of Exercise Prescription     Comments Reviewed RPE and dyspnea scale, THR and program prescription with pt today.  Pt voiced understanding and was given a copy of goals to take home. Masashi is off to a good start in the program. He was able to attend his first and only session during this review period. During his session he was able to use the treadmill at a speed of 2.9 mph and an incline of 2%, as well as the XR at level 5. We will continue to monitor his progress in the program. Geovanie is doing well at rehab, he is increasing workload as able. At home he is doing some hand weight exercise and walking 3-5 times per day     Expected Outcomes Short: Use RPE daily to regulate intensity. Long: Follow program prescription in THR. Short: Continue to follow exercise prescription. Long: Continue exercise to improve strength and stamina. STG: Increase workload as able. LTG: Continue exercise to improve strength and stamina.              Discharge Exercise Prescription (Final Exercise Prescription Changes):  Exercise Prescription Changes - 02/04/24 1400  Response to Exercise   Blood Pressure (Admit) 110/60    Blood Pressure (Exercise) 136/64    Blood Pressure (Exit) 108/58    Heart Rate (Admit) 43 bpm    Heart Rate (Exercise) 107 bpm    Heart Rate (Exit) 50 bpm    Rating of Perceived Exertion (Exercise) 13    Perceived Dyspnea (Exercise) 0    Symptoms none    Comments first two weeks of exercise    Duration Progress to 30 minutes of  aerobic without signs/symptoms of physical distress    Intensity THRR  unchanged      Progression   Progression Continue to progress workloads to maintain intensity without signs/symptoms of physical distress.    Average METs 4.02      Resistance Training   Training Prescription Yes    Weight 4lb    Reps 10-15      Interval Training   Interval Training No      Treadmill   MPH 2.9    Grade 2    Minutes 15    METs 4.02      REL-XR   Level 5    Minutes 15      Oxygen   Maintain Oxygen Saturation 88% or higher             Nutrition:  Target Goals: Understanding of nutrition guidelines, daily intake of sodium 1500mg , cholesterol 200mg , calories 30% from fat and 7% or less from saturated fats, daily to have 5 or more servings of fruits and vegetables.  Education: All About Nutrition: -Group instruction provided by verbal, written material, interactive activities, discussions, models, and posters to present general guidelines for heart healthy nutrition including fat, fiber, MyPlate, the role of sodium in heart healthy nutrition, utilization of the nutrition label, and utilization of this knowledge for meal planning. Follow up email sent as well. Written material given at graduation. Flowsheet Row Cardiac Rehab from 11/06/2022 in [facility] Cardiac and Pulmonary Rehab  Education need identified 09/17/22       Biometrics:  Pre Biometrics - 01/20/24 0923       Pre Biometrics   Height 5' 7.5" (1.715 m)    Weight 162 lb 4.8 oz (73.6 kg)    Waist Circumference 35 inches    Hip Circumference 37.5 inches    Waist to Hip Ratio 0.93 %    BMI (Calculated) 25.03    Single Leg Stand 30 seconds              Nutrition Therapy Plan and Nutrition Goals:  Nutrition Therapy & Goals - 01/20/24 0920       Nutrition Therapy   RD appointment deferred Yes      Intervention Plan   Intervention Prescribe, educate and counsel regarding individualized specific dietary modifications aiming towards targeted core components such as weight, hypertension,  lipid management, diabetes, heart failure and other comorbidities.    Expected Outcomes Short Term Goal: Understand basic principles of dietary content, such as calories, fat, sodium, cholesterol and nutrients.;Short Term Goal: A plan has been developed with personal nutrition goals set during dietitian appointment.;Long Term Goal: Adherence to prescribed nutrition plan.             Nutrition Assessments:  MEDIFICTS Score Key: >=70 Need to make dietary changes  40-70 Heart Healthy Diet <= 40 Therapeutic Level Cholesterol Diet  Flowsheet Row Cardiac Rehab from 01/20/2024 in [facility] Cardiac and Pulmonary Rehab  Picture Your Plate Total Score on Admission 64  Picture Your Plate Scores: <78 Unhealthy dietary pattern with much room for improvement. 41-50 Dietary pattern unlikely to meet recommendations for good health and room for improvement. 51-60 More healthful dietary pattern, with some room for improvement.  >60 Healthy dietary pattern, although there may be some specific behaviors that could be improved.    Nutrition Goals Re-Evaluation:  Nutrition Goals Re-Evaluation     Row Name 02/11/24 4696             Goals   Comment Ambrosio Junker deffered RD appointment                Nutrition Goals Discharge (Final Nutrition Goals Re-Evaluation):  Nutrition Goals Re-Evaluation - 02/11/24 2952       Goals   Comment Ambrosio Junker deffered RD appointment             Psychosocial: Target Goals: Acknowledge presence or absence of significant depression and/or stress, maximize coping skills, provide positive support system. Participant is able to verbalize types and ability to use techniques and skills needed for reducing stress and depression.   Education: Stress, Anxiety, and Depression - Group verbal and visual presentation to define topics covered.  Reviews how body is impacted by stress, anxiety, and depression.  Also discusses healthy ways to reduce stress and to treat/manage  anxiety and depression.  Written material given at graduation.   Education: Sleep Hygiene -Provides group verbal and written instruction about how sleep can affect your health.  Define sleep hygiene, discuss sleep cycles and impact of sleep habits. Review good sleep hygiene tips.    Initial Review & Psychosocial Screening:  Initial Psych Review & Screening - 01/18/24 1340       Initial Review   Current issues with None Identified      Family Dynamics   Good Support System? Yes    Comments He can look to his friends and family for support. 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      Barriers   Psychosocial barriers to participate in program There are no identifiable barriers or psychosocial needs.;The patient should benefit from training in stress management and relaxation.      Screening Interventions   Interventions Encouraged to exercise;Provide feedback about the scores to participant;To provide support and resources with identified psychosocial needs    Expected Outcomes Short Term goal: Utilizing psychosocial counselor, staff and physician to assist with identification of specific Stressors or current issues interfering with healing process. Setting desired goal for each stressor or current issue identified.;Long Term Goal: Stressors or current issues are controlled or eliminated.;Long Term goal: The participant improves quality of Life and PHQ9 Scores as seen by post scores and/or verbalization of changes;Short Term goal: Identification and review with participant of any Quality of Life or Depression concerns found by scoring the questionnaire.             Quality of Life Scores:   Quality of Life - 01/20/24 0919       Quality of Life   Select Quality of Life      Quality of Life Scores   Health/Function Pre 17.5 %    Socioeconomic Pre 21.63 %    Psych/Spiritual Pre 22.5 %    Family Pre 19.6 %    GLOBAL Pre 19.74 %             Scores of 19 and below usually indicate a poorer quality of life in these areas.  A  difference of  2-3 points is a clinically meaningful difference.  A difference of 2-3 points in the total score of the Quality of Life Index has been associated with significant improvement in overall quality of life, self-image, physical symptoms, and general health in studies assessing change in quality of life.  PHQ-9: Review Flowsheet       01/20/2024 11/06/2022 10/09/2022 09/17/2022  Depression screen PHQ 2/9  Decreased Interest 1 0 0 0  Down, Depressed, Hopeless 1 0 1 1  PHQ - 2 Score 2 0 1 1  Altered sleeping 2 0 1 3  Tired, decreased energy 2 1 1 1   Change in appetite 0 0 0 0  Feeling bad or failure about yourself  0 0 1 1  Trouble concentrating 1 1 1  0  Moving slowly or fidgety/restless 0 1 0 1  Suicidal thoughts 0 0 0 0  PHQ-9 Score 7 3 5 7   Difficult doing work/chores Not difficult at all Not difficult at all Somewhat difficult Not difficult at all   Interpretation of Total Score  Total Score Depression Severity:  1-4 = Minimal depression, 5-9 = Mild depression, 10-14 = Moderate depression, 15-19 = Moderately severe depression, 20-27 = Severe depression   Psychosocial Evaluation and Intervention:  Psychosocial Evaluation - 01/18/24 1341       Psychosocial Evaluation & Interventions   Interventions Encouraged to exercise with the program and follow exercise prescription;Relaxation education;Stress management education    Comments He can look to his friends and family for support. 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    Expected Outcomes Short: Start HeartTracK to help with mood. Long: Maintain a healthy mental state    Continue Psychosocial Services  Follow up required by staff             Psychosocial Re-Evaluation:  Psychosocial Re-Evaluation     Row Name 02/11/24 [phone]             Psychosocial Re-Evaluation   Current issues  with Current Sleep Concerns       Comments Jakyron struggles to stay asleep and wakes up 4-5 times per night. Reports he gets 2-7hrs of sleep. He has been working with his Dr to improve it.       Expected Outcomes STG: focus on good sleep hygiene. LTG: Achieve and maintain a positive outlook on health and daily life       Interventions Encouraged to attend Cardiac Rehabilitation for the exercise       Continue Psychosocial Services  Follow up required by staff                Psychosocial Discharge (Final Psychosocial Re-Evaluation):  Psychosocial Re-Evaluation - 02/11/24 0938       Psychosocial Re-Evaluation   Current issues with Current Sleep Concerns    Comments Adreyan struggles to stay asleep and wakes up 4-5 times per night. Reports he gets 2-7hrs of sleep. He has been working with his Dr to improve it.    Expected Outcomes STG: focus on good sleep hygiene. LTG: Achieve and maintain a positive outlook on health and daily life    Interventions Encouraged to attend Cardiac Rehabilitation for the exercise    Continue Psychosocial Services  Follow up required by staff             Vocational Rehabilitation: Provide vocational rehab assistance to qualifying candidates.   Vocational Rehab Evaluation & Intervention:   Education: Education Goals:  Education classes will be provided on a variety of topics geared toward better understanding of heart health and risk factor modification. Participant will state understanding/return demonstration of topics presented as noted by education test scores.  Learning Barriers/Preferences:  Learning Barriers/Preferences - 01/18/24 1340       Learning Barriers/Preferences   Learning Barriers None    Learning Preferences None             General Cardiac Education Topics:  AED/CPR: - Group verbal and written instruction with the use of models to demonstrate the basic use of the AED with the basic ABC's of resuscitation.   Anatomy and  Cardiac Procedures: - Group verbal and visual presentation and models provide information about basic cardiac anatomy and function. Reviews the testing methods done to diagnose heart disease and the outcomes of the test results. Describes the treatment choices: Medical Management, Angioplasty, or Coronary Bypass Surgery for treating various heart conditions including Myocardial Infarction, Angina, Valve Disease, and Cardiac Arrhythmias.  Written material given at graduation. Flowsheet Row Cardiac Rehab from 01/20/2024 in [facility] Cardiac and Pulmonary Rehab  Education need identified 01/20/24       Medication Safety: - Group verbal and visual instruction to review commonly prescribed medications for heart and lung disease. Reviews the medication, class of the drug, and side effects. Includes the steps to properly store meds and maintain the prescription regimen.  Written material given at graduation. Flowsheet Row Cardiac Rehab from 11/06/2022 in [facility] Cardiac and Pulmonary Rehab  Date 11/06/22  Educator [facility]  Instruction Review Code 1- Verbalizes Understanding       Intimacy: - Group verbal instruction through game format to discuss how heart and lung disease can affect sexual intimacy. Written material given at graduation..   Know Your Numbers and Heart Failure: - Group verbal and visual instruction to discuss disease risk factors for cardiac and pulmonary disease and treatment options.  Reviews associated critical values for Overweight/Obesity, Hypertension, Cholesterol, and Diabetes.  Discusses basics of heart failure: signs/symptoms and treatments.  Introduces Heart Failure Zone chart for action plan for heart failure.  Written material given at graduation. Flowsheet Row Cardiac Rehab from 01/20/2024 in [facility] Cardiac and Pulmonary Rehab  Education need identified 01/20/24       Infection Prevention: - Provides verbal and written material to individual with discussion of infection control  including proper hand washing and proper equipment cleaning during exercise session. Flowsheet Row Cardiac Rehab from 01/20/2024 in [facility] Cardiac and Pulmonary Rehab  Date 01/18/24  Educator jh  Instruction Review Code 1- Verbalizes Understanding       Falls Prevention: - Provides verbal and written material to individual with discussion of falls prevention and safety. Flowsheet Row Cardiac Rehab from 01/20/2024 in [facility] Cardiac and Pulmonary Rehab  Date 01/18/24  Educator jh  Instruction Review Code 1- Verbalizes Understanding       Other: -Provides group and verbal instruction on various topics (see comments) Flowsheet Row Cardiac Rehab from 11/06/2022 in [facility] Cardiac and Pulmonary Rehab  Date 10/30/22  Educator SB  Instruction Review Code 1- Verbalizes Understanding       Knowledge Questionnaire Score:  Knowledge Questionnaire Score - 01/20/24 0919       Knowledge Questionnaire Score   Pre Score 23/26             Core Components/Risk Factors/Patient Goals at Admission:  Personal Goals and Risk Factors at Admission - 01/18/24 1339       Core Components/Risk Factors/Patient Goals on  Admission    Weight Management Yes;Weight Maintenance    Intervention Weight Management: Develop a combined nutrition and exercise program designed to reach desired caloric intake, while maintaining appropriate intake of nutrient and fiber, sodium and fats, and appropriate energy expenditure required for the weight goal.;Weight Management: Provide education and appropriate resources to help participant work on and attain dietary goals.;Weight Management/Obesity: Establish reasonable short term and long term weight goals.    Expected Outcomes Short Term: Continue to assess and modify interventions until short term weight is achieved;Weight Maintenance: Understanding of the daily nutrition guidelines, which includes 25-35% calories from fat, 7% or less cal from saturated fats, less than 200mg   cholesterol, less than 1.5gm of sodium, & 5 or more servings of fruits and vegetables daily;Understanding recommendations for meals to include 15-35% energy as protein, 25-35% energy from fat, 35-60% energy from carbohydrates, less than 200mg  of dietary cholesterol, 20-35 gm of total fiber daily;Understanding of distribution of calorie intake throughout the day with the consumption of 4-5 meals/snacks    Diabetes Yes    Intervention Provide education about signs/symptoms and action to take for hypo/hyperglycemia.;Provide education about proper nutrition, including hydration, and aerobic/resistive exercise prescription along with prescribed medications to achieve blood glucose in normal ranges: Fasting glucose 65-99 mg/dL    Expected Outcomes Short Term: Participant verbalizes understanding of the signs/symptoms and immediate care of hyper/hypoglycemia, proper foot care and importance of medication, aerobic/resistive exercise and nutrition plan for blood glucose control.;Long Term: Attainment of HbA1C < 7%.    Hypertension Yes    Intervention Provide education on lifestyle modifcations including regular physical activity/exercise, weight management, moderate sodium restriction and increased consumption of fresh fruit, vegetables, and low fat dairy, alcohol moderation, and smoking cessation.;Monitor prescription use compliance.    Expected Outcomes Short Term: Continued assessment and intervention until BP is < 140/50mm HG in hypertensive participants. < 130/51mm HG in hypertensive participants with diabetes, heart failure or chronic kidney disease.;Long Term: Maintenance of blood pressure at goal levels.             Education:Diabetes - Individual verbal and written instruction to review signs/symptoms of diabetes, desired ranges of glucose level fasting, after meals and with exercise. Acknowledge that pre and post exercise glucose checks will be done for 3 sessions at entry of program. Flowsheet Row  Cardiac Rehab from 01/20/2024 in [facility] Cardiac and Pulmonary Rehab  Date 01/18/24  Educator jh  Instruction Review Code 1- Verbalizes Understanding       Core Components/Risk Factors/Patient Goals Review:   Goals and Risk Factor Review     Row Name 02/11/24 0936             Core Components/Risk Factors/Patient Goals Review   Personal Goals Review Hypertension       Review Kalee is checking his blood pressure at home, says it runs consistently around 110/60s. He reports it matches well with the readings here at rehab.       Expected Outcomes STG: Continue to check BP regularly. LTG: manage risk factors independently                Core Components/Risk Factors/Patient Goals at Discharge (Final Review):   Goals and Risk Factor Review - 02/11/24 0936       Core Components/Risk Factors/Patient Goals Review   Personal Goals Review Hypertension    Review Demir is checking his blood pressure at home, says it runs consistently around 110/60s. He reports it matches well with the readings here at rehab.  Expected Outcomes STG: Continue to check BP regularly. LTG: manage risk factors independently             ITP Comments:  ITP Comments     Row Name 01/18/24 1339 01/20/24 0917 01/28/24 1029 02/17/24 1041     ITP Comments Virtual Visit completed. Patient informed on EP and RD appointment and 6 Minute walk test. Patient also informed of patient health questionnaires on My Chart. Patient Verbalizes understanding. Visit diagnosis can be found in [facility] 12/10/2023. Completed and gym orientation for cardiac rehab. Initial ITP created and sent for review to Dr. Firman Hughes, Medical Director. irst full day of exercise!  Patient was oriented to gym and equipment including functions, settings, policies, and procedures.  Patient's individual exercise prescription and treatment plan were reviewed.  All starting workloads were established based on the results of the 6 minute walk test done at  initial orientation visit.  The plan for exercise progression was also introduced and progression will be customized based on patient's performance and goals. 30 Day review completed. Medical Director ITP review done, changes made as directed, and signed approval by Medical Director.    new to program             Comments:

## 2024-02-18 ENCOUNTER — Encounter: Admitting: *Deleted

## 2024-02-18 DIAGNOSIS — Z951 Presence of aortocoronary bypass graft: Secondary | ICD-10-CM | POA: Diagnosis not present

## 2024-02-18 DIAGNOSIS — Z955 Presence of coronary angioplasty implant and graft: Secondary | ICD-10-CM

## 2024-02-18 NOTE — Progress Notes (Signed)
 Daily Session Note  Patient Details  Name: Marcus Huang MRN: [id number] Date of Birth: 05-21-1951 Referring Provider:   Flowsheet Row Cardiac Rehab from 01/20/2024 in [facility] Cardiac and Pulmonary Rehab  Referring Provider Dr. Percival Brace       Encounter Date: 02/18/2024  Check In:  Session Check In - 02/18/24 1027       Check-In   Supervising physician immediately available to respond to emergencies See telemetry face sheet for immediately available ER MD    Location [facility]-Cardiac & Pulmonary Rehab    Staff Present Maud Sorenson, RN, BSN, CCRP;Margaret Best, MS, Exercise Physiologist;Maxon Conetta BS, Exercise Physiologist;Joseph Lacinda Pica RCP,RRT,BSRT    Virtual Visit No    Medication changes reported     No    Fall or balance concerns reported    No    Warm-up and Cool-down Performed on first and last piece of equipment    Resistance Training Performed Yes    VAD Patient? No    PAD/SET Patient? No      Pain Assessment   Currently in Pain? No/denies                Social History   Tobacco Use  Smoking Status Never  Smokeless Tobacco Never    Goals Met:  Independence with exercise equipment Exercise tolerated well No report of concerns or symptoms today  Goals Unmet:  Not Applicable  Comments: Pt able to follow exercise prescription today without complaint.  Will continue to monitor for progression.    Dr. Firman Hughes is Medical Director for [facility] Cardiac Rehabilitation.  Dr. Fuad Aleskerov is Medical Director for [facility] Pulmonary Rehabilitation.

## 2024-02-23 ENCOUNTER — Encounter: Admitting: *Deleted

## 2024-02-23 DIAGNOSIS — Z951 Presence of aortocoronary bypass graft: Secondary | ICD-10-CM | POA: Diagnosis not present

## 2024-02-23 NOTE — Progress Notes (Signed)
 Daily Session Note  Patient Details  Name: Marcus Huang MRN: [id number] Date of Birth: [birth year] Referring Provider:   Flowsheet Row Cardiac Rehab from 01/20/2024 in [facility] Cardiac and Pulmonary Rehab  Referring Provider Dr. Percival Brace       Encounter Date: 02/23/2024  Check In:  Session Check In - 02/23/24 0935       Check-In   Supervising physician immediately available to respond to emergencies See telemetry face sheet for immediately available ER MD    Location [facility]-Cardiac & Pulmonary Rehab    Staff Present Maud Sorenson, RN, BSN, CCRP;Noah Tickle, BS, Exercise Physiologist;Kelly Bollinger RN,BSN,MPA;Jason Martina Sledge RDN,LDN    Virtual Visit No    Medication changes reported     No    Fall or balance concerns reported    No    Warm-up and Cool-down Performed on first and last piece of equipment    Resistance Training Performed Yes    VAD Patient? No    PAD/SET Patient? No      Pain Assessment   Currently in Pain? No/denies                Social History   Tobacco Use  Smoking Status Never  Smokeless Tobacco Never    Goals Met:  Independence with exercise equipment Exercise tolerated well No report of concerns or symptoms today  Goals Unmet:  Not Applicable  Comments: Pt able to follow exercise prescription today without complaint.  Will continue to monitor for progression.    Dr. Firman Hughes is Medical Director for [facility] Cardiac Rehabilitation.  Dr. Fuad Aleskerov is Medical Director for [facility] Pulmonary Rehabilitation.

## 2024-02-25 ENCOUNTER — Encounter: Admitting: *Deleted

## 2024-02-25 DIAGNOSIS — Z951 Presence of aortocoronary bypass graft: Secondary | ICD-10-CM

## 2024-02-25 NOTE — Progress Notes (Signed)
 Daily Session Note  Patient Details  Name: Marcus Huang MRN: [id number] Date of Birth: December 18, 1950 Referring Provider:   Flowsheet Row Cardiac Rehab from 01/20/2024 in [facility] Cardiac and Pulmonary Rehab  Referring Provider Dr. Percival Brace    Encounter Date: 02/25/2024  Check In:  Session Check In - 02/25/24 0949       Check-In   Supervising physician immediately available to respond to emergencies See telemetry face sheet for immediately available ER MD    Location [facility]-Cardiac & Pulmonary Rehab    Staff Present Maud Sorenson, RN, BSN, CCRP;Meredith Manson Seitz RN,BSN;Noah Tickle, BS, Exercise Physiologist;Jason Martina Sledge RDN,LDN    Virtual Visit No    Medication changes reported     No    Fall or balance concerns reported    No    Warm-up and Cool-down Performed on first and last piece of equipment    Resistance Training Performed Yes    VAD Patient? No    PAD/SET Patient? No      Pain Assessment   Currently in Pain? No/denies             Social History   Tobacco Use  Smoking Status Never  Smokeless Tobacco Never    Goals Met:  Independence with exercise equipment Exercise tolerated well No report of concerns or symptoms today  Goals Unmet:  Not Applicable  Comments: Pt able to follow exercise prescription today without complaint.  Will continue to monitor for progression.    Dr. Firman Hughes is Medical Director for [facility] Cardiac Rehabilitation.  Dr. Fuad Aleskerov is Medical Director for [facility] Pulmonary Rehabilitation.

## 2024-03-01 ENCOUNTER — Encounter: Admitting: *Deleted

## 2024-03-01 DIAGNOSIS — Z951 Presence of aortocoronary bypass graft: Secondary | ICD-10-CM | POA: Diagnosis not present

## 2024-03-01 NOTE — Progress Notes (Signed)
 Daily Session Note  Patient Details  Name: Marcus Huang MRN: [id number] Date of Birth: 05-15-1951 Referring Provider:   Flowsheet Row Cardiac Rehab from 01/20/2024 in [facility] Cardiac and Pulmonary Rehab  Referring Provider Dr. Percival Brace    Encounter Date: 03/01/2024  Check In:  Session Check In - 03/01/24 0948       Check-In   Supervising physician immediately available to respond to emergencies See telemetry face sheet for immediately available ER MD    Location [facility]-Cardiac & Pulmonary Rehab    Staff Present Maud Sorenson, RN, BSN, CCRP;Maxon Conetta BS, Exercise Physiologist;Noah Tickle, BS, Exercise Physiologist;Jason Martina Sledge RDN,LDN    Virtual Visit No    Medication changes reported     No    Fall or balance concerns reported    No    Warm-up and Cool-down Performed on first and last piece of equipment    Resistance Training Performed Yes    VAD Patient? No    PAD/SET Patient? No      Pain Assessment   Currently in Pain? No/denies             Social History   Tobacco Use  Smoking Status Never  Smokeless Tobacco Never    Goals Met:  Independence with exercise equipment Exercise tolerated well No report of concerns or symptoms today  Goals Unmet:  Not Applicable  Comments: Pt able to follow exercise prescription today without complaint.  Will continue to monitor for progression.    Dr. Firman Hughes is Medical Director for [facility] Cardiac Rehabilitation.  Dr. Fuad Aleskerov is Medical Director for [facility] Pulmonary Rehabilitation.

## 2024-03-03 ENCOUNTER — Encounter: Admitting: *Deleted

## 2024-03-03 DIAGNOSIS — Z951 Presence of aortocoronary bypass graft: Secondary | ICD-10-CM | POA: Diagnosis not present

## 2024-03-03 NOTE — Progress Notes (Signed)
 Daily Session Note  Patient Details  Name: Marcus Huang MRN: [id number] Date of Birth: January 07, 1951 Referring Provider:   Flowsheet Row Cardiac Rehab from 01/20/2024 in [facility] Cardiac and Pulmonary Rehab  Referring Provider Dr. Percival Brace    Encounter Date: 03/03/2024  Check In:  Session Check In - 03/03/24 1017       Check-In   Supervising physician immediately available to respond to emergencies See telemetry face sheet for immediately available ER MD    Location [facility]-Cardiac & Pulmonary Rehab    Staff Present Maud Sorenson, RN, BSN, CCRP;Joseph Hood RCP,RRT,BSRT;Maxon Arco BS, Exercise Physiologist;Krista Spencer RN,BSN    Virtual Visit No    Medication changes reported     No    Fall or balance concerns reported    No    Warm-up and Cool-down Performed on first and last piece of equipment    Resistance Training Performed Yes    VAD Patient? No    PAD/SET Patient? No      Pain Assessment   Currently in Pain? No/denies             Social History   Tobacco Use  Smoking Status Never  Smokeless Tobacco Never    Goals Met:  Independence with exercise equipment Exercise tolerated well No report of concerns or symptoms today  Goals Unmet:  Not Applicable  Comments: Pt able to follow exercise prescription today without complaint.  Will continue to monitor for progression.    Dr. Firman Hughes is Medical Director for [facility] Cardiac Rehabilitation.  Dr. Fuad Aleskerov is Medical Director for [facility] Pulmonary Rehabilitation.

## 2024-03-08 ENCOUNTER — Encounter: Admitting: *Deleted

## 2024-03-08 DIAGNOSIS — Z951 Presence of aortocoronary bypass graft: Secondary | ICD-10-CM | POA: Diagnosis not present

## 2024-03-08 NOTE — Progress Notes (Signed)
 Daily Session Note  Patient Details  Name: Marcus Huang MRN: [id number] Date of Birth: 1951/04/30 Referring Provider:   Flowsheet Row Cardiac Rehab from 01/20/2024 in [facility] Cardiac and Pulmonary Rehab  Referring Provider Dr. Marsa Dooms    Encounter Date: 03/08/2024  Check In:  Session Check In - 03/08/24 0929       Check-In   Supervising physician immediately available to respond to emergencies See telemetry face sheet for immediately available ER MD    Location [facility]-Cardiac & Pulmonary Rehab    Staff Present Othel Durand, RN, BSN, CCRP;Noah Tickle, BS, Exercise Physiologist;Maxon Conetta BS, Exercise Physiologist;Jason Elnor RDN,LDN    Virtual Visit No    Medication changes reported     No    Fall or balance concerns reported    No    Warm-up and Cool-down Performed on first and last piece of equipment    Resistance Training Performed Yes    VAD Patient? No    PAD/SET Patient? No      Pain Assessment   Currently in Pain? No/denies             Social History   Tobacco Use  Smoking Status Never  Smokeless Tobacco Never    Goals Met:  Independence with exercise equipment Exercise tolerated well No report of concerns or symptoms today  Goals Unmet:  Not Applicable  Comments: Pt able to follow exercise prescription today without complaint.  Will continue to monitor for progression.   Reviewed home exercise with pt today.  Pt plans to walk and use hand weights for exercise.  Reviewed THR, pulse, RPE, sign and symptoms, pulse oximetery and when to call 911 or MD.  Also discussed weather considerations and indoor options.  Pt voiced understanding.   Dr. Oneil Pinal is Medical Director for [facility] Cardiac Rehabilitation.  Dr. Fuad Aleskerov is Medical Director for [facility] Pulmonary Rehabilitation.

## 2024-03-09 DIAGNOSIS — M791 Myalgia, unspecified site: Secondary | ICD-10-CM | POA: Diagnosis not present

## 2024-03-09 DIAGNOSIS — I25119 Atherosclerotic heart disease of native coronary artery with unspecified angina pectoris: Secondary | ICD-10-CM | POA: Diagnosis not present

## 2024-03-09 DIAGNOSIS — E785 Hyperlipidemia, unspecified: Secondary | ICD-10-CM | POA: Diagnosis not present

## 2024-03-09 DIAGNOSIS — I10 Essential (primary) hypertension: Secondary | ICD-10-CM | POA: Diagnosis not present

## 2024-03-09 DIAGNOSIS — E119 Type 2 diabetes mellitus without complications: Secondary | ICD-10-CM | POA: Diagnosis not present

## 2024-03-10 ENCOUNTER — Encounter: Admitting: *Deleted

## 2024-03-10 DIAGNOSIS — Z951 Presence of aortocoronary bypass graft: Secondary | ICD-10-CM

## 2024-03-10 NOTE — Progress Notes (Signed)
 Daily Session Note  Patient Details  Name: Marcus Huang MRN: [id number] Date of Birth: 15-Sep-1951 Referring Provider:   Flowsheet Row Cardiac Rehab from 01/20/2024 in [facility] Cardiac and Pulmonary Rehab  Referring Provider Dr. Marsa Dooms    Encounter Date: 03/10/2024  Check In:  Session Check In - 03/10/24 0957       Check-In   Supervising physician immediately available to respond to emergencies See telemetry face sheet for immediately available ER MD    Location [facility]-Cardiac & Pulmonary Rehab    Staff Present Othel Durand, RN, BSN, CCRP;Joseph Hood RCP,RRT,BSRT;Maxon [place] BS, Exercise Physiologist;Noah Tickle, BS, Exercise Physiologist    Virtual Visit No    Medication changes reported     No    Fall or balance concerns reported    No    Warm-up and Cool-down Performed on first and last piece of equipment    Resistance Training Performed Yes    VAD Patient? No    PAD/SET Patient? No      Pain Assessment   Currently in Pain? No/denies             Social History   Tobacco Use  Smoking Status Never  Smokeless Tobacco Never    Goals Met:  Independence with exercise equipment Exercise tolerated well No report of concerns or symptoms today  Goals Unmet:  Not Applicable  Comments: Pt able to follow exercise prescription today without complaint.  Will continue to monitor for progression.    Dr. Oneil Pinal is Medical Director for [facility] Cardiac Rehabilitation.  Dr. Fuad Aleskerov is Medical Director for [facility] Pulmonary Rehabilitation.

## 2024-03-15 ENCOUNTER — Encounter: Attending: Cardiology

## 2024-03-15 DIAGNOSIS — Z951 Presence of aortocoronary bypass graft: Secondary | ICD-10-CM | POA: Diagnosis not present

## 2024-03-15 DIAGNOSIS — Z955 Presence of coronary angioplasty implant and graft: Secondary | ICD-10-CM | POA: Diagnosis not present

## 2024-03-15 NOTE — Progress Notes (Signed)
 Daily Session Note  Patient Details  Name: Marcus Huang MRN: [id number] Date of Birth: [birth year] Referring Provider:   Flowsheet Row Cardiac Rehab from 01/20/2024 in [facility] Cardiac and Pulmonary Rehab  Referring Provider Dr. Marsa Dooms    Encounter Date: 03/15/2024  Check In:  Session Check In - 03/15/24 0916       Check-In   Supervising physician immediately available to respond to emergencies See telemetry face sheet for immediately available ER MD    Location [facility]-Cardiac & Pulmonary Rehab    Staff Present Burnard Davenport RN,BSN,MPA;Maxon Conetta BS, Exercise Physiologist;Margaret Best, MS, Exercise Physiologist;Jason Elnor RDN,LDN    Virtual Visit No    Medication changes reported     No    Fall or balance concerns reported    No    Tobacco Cessation No Change    Warm-up and Cool-down Performed on first and last piece of equipment    Resistance Training Performed Yes    VAD Patient? No    PAD/SET Patient? No      Pain Assessment   Currently in Pain? No/denies             Social History   Tobacco Use  Smoking Status Never  Smokeless Tobacco Never    Goals Met:  Independence with exercise equipment Exercise tolerated well No report of concerns or symptoms today Strength training completed today  Goals Unmet:  Not Applicable  Comments: Pt able to follow exercise prescription today without complaint.  Will continue to monitor for progression.    Dr. Oneil Pinal is Medical Director for [facility] Cardiac Rehabilitation.  Dr. Fuad Aleskerov is Medical Director for [facility] Pulmonary Rehabilitation.

## 2024-03-16 ENCOUNTER — Encounter: Payer: Self-pay | Admitting: *Deleted

## 2024-03-16 DIAGNOSIS — Z951 Presence of aortocoronary bypass graft: Secondary | ICD-10-CM

## 2024-03-16 NOTE — Progress Notes (Signed)
 Cardiac Individual Treatment Plan  Patient Details  Name: Marcus Huang MRN: [id number] Date of Birth: 1951-05-04 Referring Provider:   Flowsheet Row Cardiac Rehab from 01/20/2024 in [facility] Cardiac and Pulmonary Rehab  Referring Provider Dr. Marsa Dooms    Initial Encounter Date:  Flowsheet Row Cardiac Rehab from 01/20/2024 in [facility] Cardiac and Pulmonary Rehab  Date 01/20/24    Visit Diagnosis: S/P CABG x 3  Patient's Home Medications on Admission:  Current Outpatient Medications:    acetaminophen  (TYLENOL ) 325 MG tablet, Take 2 tablets (650 mg total) by mouth every 6 (six) hours as needed for mild pain (pain score 1-3)., Disp: , Rfl:    amiodarone  (PACERONE ) 200 MG tablet, Take 2 tablets by mouth twice per day for 7 days, then take 1 tablet twice per day for 7 days, then take 1 tablet daily thereafter, Disp: 60 tablet, Rfl: 1   amiodarone  (PACERONE ) 200 MG tablet, 200 mg., Disp: , Rfl:    amLODipine  (NORVASC ) 10 MG tablet, Take 1 tablet (10 mg total) by mouth daily. (Patient not taking: Reported on 01/18/2024), Disp: 30 tablet, Rfl: 1   amLODipine  (NORVASC ) 10 MG tablet, Take 1 tablet by mouth daily., Disp: , Rfl:    aspirin  EC 325 MG tablet, Take 1 tablet (325 mg total) by mouth daily. (Patient not taking: Reported on 01/18/2024), Disp: , Rfl:    aspirin  EC 81 MG tablet, Take 81 mg by mouth daily. Swallow whole. (Patient not taking: Reported on 01/18/2024), Disp: , Rfl:    aspirin  EC 81 MG tablet, Take 81 mg by mouth., Disp: , Rfl:    Continuous Glucose Sensor (FREESTYLE LIBRE 3 PLUS SENSOR) MISC, FOLLOW PACKAGE DIRECTIONS CHANGE EVERY 15 DAYS, Disp: , Rfl:    lisinopril  (ZESTRIL ) 5 MG tablet, Take 1 tablet (5 mg total) by mouth daily., Disp: 30 tablet, Rfl: 1   metoprolol  tartrate (LOPRESSOR ) 50 MG tablet, Take 1 tablet (50 mg total) by mouth 2 (two) times daily., Disp: 60 tablet, Rfl: 1   metoprolol  tartrate (LOPRESSOR ) 50 MG tablet, Take 1 tablet by mouth 2 (two) times daily. (Patient  not taking: Reported on 01/18/2024), Disp: , Rfl:    Omega-3 Fatty Acids (OMEGA 3 500 PO), Take 500 mg by mouth daily., Disp: , Rfl:    ONETOUCH ULTRA TEST test strip, 1 each 3 (three) times daily., Disp: , Rfl:    oxyCODONE  (OXY IR/ROXICODONE ) 5 MG immediate release tablet, Take 1 tablet (5 mg total) by mouth every 6 (six) hours as needed for severe pain (pain score 7-10)., Disp: 28 tablet, Rfl: 0   PREVIDENT 5000 SENSITIVE 1.1-5 % GEL, Place 1 Application onto teeth 2 (two) times daily., Disp: , Rfl:    REPATHA SURECLICK 140 MG/ML SOAJ, Inject 1 mL into the skin every 14 (fourteen) days., Disp: , Rfl:   Past Medical History: Past Medical History:  Diagnosis Date   Carotid artery thrombosis    Left Carotid Artery   Coronary artery disease    Diabetes (HCC)    Diverticulosis    History of kidney stones    Hyperlipidemia    Hypertension    Renal mass    Sleep apnea    C-PAP    Tobacco Use: Social History   Tobacco Use  Smoking Status Never  Smokeless Tobacco Never    Labs: Review Flowsheet       Latest Ref Rng & Units 12/08/2023 12/10/2023 12/12/2023  Labs for ITP Cardiac and Pulmonary Rehab  Hemoglobin A1c 4.8 -  5.6 % 5.6  - -  PH, Arterial 7.35 - 7.45 - 7.317  7.356  7.386  7.419  7.416  7.298  7.387  7.376   PCO2 arterial 32 - 48 mmHg - 43.3  41.6  37.1  33.4  35.2  51.0  39.0  35.8   Bicarbonate 20.0 - 28.0 mmol/L - 22.0  23.2  22.7  21.6  22.6  23.1  25.0  23.5  20.9   TCO2 22 - 32 mmol/L - 23  24  24  21  23  23  24  25  24  27  25  24  25  22    Acid-base deficit 0.0 - 2.0 mmol/L - 4.0  2.0  3.0  3.0  2.0  3.0  2.0  1.0  4.0   O2 Saturation % - 93  93  97  100  100  87  100  100  91     Details       Multiple values from one day are sorted in reverse-chronological order          Exercise Target Goals: Exercise Program Goal: Individual exercise prescription set using results from initial 6 min walk test and THRR while considering  patient's activity barriers  and safety.   Exercise Prescription Goal: Initial exercise prescription builds to 30-45 minutes a day of aerobic activity, 2-3 days per week.  Home exercise guidelines will be given to patient during program as part of exercise prescription that the participant will acknowledge.   Education: Aerobic Exercise: - Group verbal and visual presentation on the components of exercise prescription. Introduces F.I.T.T principle from ACSM for exercise prescriptions.  Reviews F.I.T.T. principles of aerobic exercise including progression. Written material given at graduation. Flowsheet Row Cardiac Rehab from 01/20/2024 in [facility] Cardiac and Pulmonary Rehab  Education need identified 01/20/24    Education: Resistance Exercise: - Group verbal and visual presentation on the components of exercise prescription. Introduces F.I.T.T principle from ACSM for exercise prescriptions  Reviews F.I.T.T. principles of resistance exercise including progression. Written material given at graduation. Flowsheet Row Cardiac Rehab from 11/06/2022 in [facility] Cardiac and Pulmonary Rehab  Date 10/09/22  Educator [facility]  Instruction Review Code 1- Verbalizes Understanding     Education: Exercise & Equipment Safety: - Individual verbal instruction and demonstration of equipment use and safety with use of the equipment. Flowsheet Row Cardiac Rehab from 01/20/2024 in [facility] Cardiac and Pulmonary Rehab  Date 01/18/24  Educator jh  Instruction Review Code 1- Verbalizes Understanding    Education: Exercise Physiology & General Exercise Guidelines: - Group verbal and written instruction with models to review the exercise physiology of the cardiovascular system and associated critical values. Provides general exercise guidelines with specific guidelines to those with heart or lung disease.    Education: Flexibility, Balance, Mind/Body Relaxation: - Group verbal and visual presentation with interactive activity on the components of exercise  prescription. Introduces F.I.T.T principle from ACSM for exercise prescriptions. Reviews F.I.T.T. principles of flexibility and balance exercise training including progression. Also discusses the mind body connection.  Reviews various relaxation techniques to help reduce and manage stress (i.e. Deep breathing, progressive muscle relaxation, and visualization). Balance handout provided to take home. Written material given at graduation. Flowsheet Row Cardiac Rehab from 11/06/2022 in [facility] Cardiac and Pulmonary Rehab  Date 10/16/22  Educator [facility]  Instruction Review Code 1- Verbalizes Understanding    Activity Barriers & Risk Stratification:  Activity Barriers & Cardiac Risk Stratification - 01/20/24 9077  Activity Barriers & Cardiac Risk Stratification   Activity Barriers None;Other (comment)    Comments lifting restrictions <10 lb    Cardiac Risk Stratification High          6 Minute Walk:  6 Minute Walk     Row Name 01/20/24 0924         6 Minute Walk   Phase Initial     Distance 1560 feet     Walk Time 6 minutes     # of Rest Breaks 0     MPH 2.95     METS 3.32     RPE 8     Perceived Dyspnea  0     VO2 Peak 11.63     Symptoms No     Resting HR 46 bpm     Resting BP 124/64     Resting Oxygen Saturation  100 %     Exercise Oxygen Saturation  during 6 min walk 99 %     Max Ex. HR 72 bpm     Max Ex. BP 148/66     2 Minute Post BP 140/70        Oxygen Initial Assessment:   Oxygen Re-Evaluation:   Oxygen Discharge (Final Oxygen Re-Evaluation):   Initial Exercise Prescription:  Initial Exercise Prescription - 01/20/24 0900       Date of Initial Exercise RX and Referring Provider   Date 01/20/24    Referring Provider Dr. Marsa Dooms      Oxygen   Maintain Oxygen Saturation 88% or higher      Treadmill   MPH 2.8    Grade 1    Minutes 15    METs 3.53      Elliptical   Level 1    Speed 3    Minutes 15    METs 3.32      REL-XR   Level 3     Speed 50    Minutes 15    METs 3.32      Prescription Details   Frequency (times per week) 2    Duration Progress to 30 minutes of continuous aerobic without signs/symptoms of physical distress      Intensity   THRR 40-80% of Max Heartrate 87-129    Ratings of Perceived Exertion 11-13    Perceived Dyspnea 0-4      Progression   Progression Continue to progress workloads to maintain intensity without signs/symptoms of physical distress.      Resistance Training   Training Prescription Yes    Weight 4 lb    Reps 10-15          Perform Capillary Blood Glucose checks as needed.  Exercise Prescription Changes:   Exercise Prescription Changes     Row Name 01/20/24 0900 02/04/24 1400 02/18/24 1600 03/03/24 1600 03/08/24 0900     Response to Exercise   Blood Pressure (Admit) 124/64 110/60 122/62 118/62 --   Blood Pressure (Exercise) 148/66 136/64 144/76 148/60 --   Blood Pressure (Exit) 140/70 108/58 114/66 124/66 --   Heart Rate (Admit) 46 bpm 43 bpm 53 bpm 68 bpm --   Heart Rate (Exercise) 72 bpm 107 bpm 91 bpm 93 bpm --   Heart Rate (Exit) 48 bpm 50 bpm 62 bpm 50 bpm --   Oxygen Saturation (Admit) 100 % -- -- -- --   Oxygen Saturation (Exercise) 99 % -- -- -- --   Rating of Perceived Exertion (Exercise) 8 13 14 12  --  Perceived Dyspnea (Exercise) 0 0 -- -- --   Symptoms none none none none --   Comments Results first two weeks of exercise -- -- --   Duration -- Progress to 30 minutes of  aerobic without signs/symptoms of physical distress Continue with 30 min of aerobic exercise without signs/symptoms of physical distress. Continue with 30 min of aerobic exercise without signs/symptoms of physical distress. Continue with 30 min of aerobic exercise without signs/symptoms of physical distress.   Intensity -- THRR unchanged THRR unchanged THRR unchanged THRR unchanged     Progression   Progression -- Continue to progress workloads to maintain intensity without  signs/symptoms of physical distress. Continue to progress workloads to maintain intensity without signs/symptoms of physical distress. Continue to progress workloads to maintain intensity without signs/symptoms of physical distress. Continue to progress workloads to maintain intensity without signs/symptoms of physical distress.   Average METs -- 4.02 4.27 4.47 4.47     Resistance Training   Training Prescription -- Yes Yes Yes Yes   Weight -- 4lb 4 lb 10lb 10lb   Reps -- 10-15 10-15 10-15 10-15     Interval Training   Interval Training -- No No No No     Treadmill   MPH -- 2.9 3 3 3    Grade -- 2 4.5 4.5 4.5   Minutes -- 15 15 15 15    METs -- 4.02 5.16 5.16 5.16     Elliptical   Level -- -- 1 1 1    Speed -- -- 3 -- --   Minutes -- -- 15 15 15    METs -- -- 3.1 4.2 4.2     REL-XR   Level -- 5 6 5 5    Minutes -- 15 15 15 15    METs -- -- -- 3.6 3.6     Home Exercise Plan   Plans to continue exercise at -- -- -- -- Home (comment)  walking and hand weights.   Frequency -- -- -- -- Add 2 additional days to program exercise sessions.   Initial Home Exercises Provided -- -- -- -- 03/08/24     Oxygen   Maintain Oxygen Saturation -- 88% or higher 88% or higher 88% or higher 88% or higher    Row Name 03/15/24 1300             Response to Exercise   Blood Pressure (Admit) 102/60       Blood Pressure (Exit) 108/62       Heart Rate (Admit) 64 bpm       Heart Rate (Exercise) 99 bpm       Heart Rate (Exit) 62 bpm       Rating of Perceived Exertion (Exercise) 11       Symptoms none       Duration Continue with 30 min of aerobic exercise without signs/symptoms of physical distress.       Intensity THRR unchanged         Progression   Progression Continue to progress workloads to maintain intensity without signs/symptoms of physical distress.       Average METs 4.86         Resistance Training   Training Prescription Yes       Weight 10 lb       Reps 10-15         Interval  Training   Interval Training No         Treadmill   MPH 3  Grade 7       Minutes 15       METs 6.19         Elliptical   Level 3       Speed 6       Minutes 15       METs 5.5         REL-XR   Level 7       Minutes 15       METs 3.7         Home Exercise Plan   Plans to continue exercise at Home (comment)  walking and hand weights.       Frequency Add 2 additional days to program exercise sessions.       Initial Home Exercises Provided 03/08/24         Oxygen   Maintain Oxygen Saturation 88% or higher          Exercise Comments:   Exercise Comments     Row Name 01/28/24 1029           Exercise Comments irst full day of exercise!  Patient was oriented to gym and equipment including functions, settings, policies, and procedures.  Patient's individual exercise prescription and treatment plan were reviewed.  All starting workloads were established based on the results of the 6 minute walk test done at initial orientation visit.  The plan for exercise progression was also introduced and progression will be customized based on patient's performance and goals.          Exercise Goals and Review:   Exercise Goals     Row Name 01/20/24 0923             Exercise Goals   Increase Physical Activity Yes       Intervention Provide advice, education, support and counseling about physical activity/exercise needs.;Develop an individualized exercise prescription for aerobic and resistive training based on initial evaluation findings, risk stratification, comorbidities and participant's personal goals.       Expected Outcomes Short Term: Attend rehab on a regular basis to increase amount of physical activity.;Long Term: Add in home exercise to make exercise part of routine and to increase amount of physical activity.;Long Term: Exercising regularly at least 3-5 days a week.       Increase Strength and Stamina Yes       Intervention Provide advice, education, support and  counseling about physical activity/exercise needs.;Develop an individualized exercise prescription for aerobic and resistive training based on initial evaluation findings, risk stratification, comorbidities and participant's personal goals.       Expected Outcomes Short Term: Increase workloads from initial exercise prescription for resistance, speed, and METs.;Short Term: Perform resistance training exercises routinely during rehab and add in resistance training at home;Long Term: Improve cardiorespiratory fitness, muscular endurance and strength as measured by increased METs and functional capacity ( )       Able to understand and use rate of perceived exertion (RPE) scale Yes       Intervention Provide education and explanation on how to use RPE scale       Expected Outcomes Short Term: Able to use RPE daily in rehab to express subjective intensity level;Long Term:  Able to use RPE to guide intensity level when exercising independently       Able to understand and use Dyspnea scale Yes       Intervention Provide education and explanation on how to use Dyspnea scale       Expected  Outcomes Short Term: Able to use Dyspnea scale daily in rehab to express subjective sense of shortness of breath during exertion;Long Term: Able to use Dyspnea scale to guide intensity level when exercising independently       Knowledge and understanding of Target Heart Rate Range (THRR) Yes       Intervention Provide education and explanation of THRR including how the numbers were predicted and where they are located for reference       Expected Outcomes Short Term: Able to state/look up THRR;Long Term: Able to use THRR to govern intensity when exercising independently;Short Term: Able to use daily as guideline for intensity in rehab       Able to check pulse independently Yes       Intervention Provide education and demonstration on how to check pulse in carotid and radial arteries.;Review the importance of being able to  check your own pulse for safety during independent exercise       Expected Outcomes Short Term: Able to explain why pulse checking is important during independent exercise       Understanding of Exercise Prescription Yes       Intervention Provide education, explanation, and written materials on patient's individual exercise prescription       Expected Outcomes Short Term: Able to explain program exercise prescription;Long Term: Able to explain home exercise prescription to exercise independently          Exercise Goals Re-Evaluation :  Exercise Goals Re-Evaluation     Row Name 01/28/24 1029 02/04/24 1424 02/11/24 0941 02/18/24 1609 02/23/24 0939     Exercise Goal Re-Evaluation   Exercise Goals Review -- Increase Physical Activity;Increase Strength and Stamina;Understanding of Exercise Prescription Increase Physical Activity;Increase Strength and Stamina;Understanding of Exercise Prescription Increase Physical Activity;Increase Strength and Stamina;Understanding of Exercise Prescription Increase Physical Activity;Increase Strength and Stamina;Understanding of Exercise Prescription   Comments Reviewed RPE and dyspnea scale, THR and program prescription with pt today.  Pt voiced understanding and was given a copy of goals to take home. Glennis is off to a good start in the program. He was able to attend his first and only session during this review period. During his session he was able to use the treadmill at a speed of 2.9 mph and an incline of 2%, as well as the XR at level 5. We will continue to monitor his progress in the program. Shantanu is doing well at rehab, he is increasing workload as able. At home he is doing some hand weight exercise and walking 3-5 times per day Donevin continues to do well in rehab. He increased his treadmill worload to a speed of 3 mph with an incline of 4.5%. He also began using the elliptical at level 1 and improved to level 6 on the XR. We will continue to monitor his  progress in the program. Gerhart is doing well at rehab, he is increasing his workload during visits. He says the consistentcy of attending rehab has helped him improve in stamina. He is walking and using handweights most days at home.   Expected Outcomes Short: Use RPE daily to regulate intensity. Long: Follow program prescription in THR. Short: Continue to follow exercise prescription. Long: Continue exercise to improve strength and stamina. STG: Increase workload as able. LTG: Continue exercise to improve strength and stamina. Short: Continue to progressively increase treadmill workload. Long: Continue exercise to improve strength and stamina. STG: Continue to attend rehab and exercise at home. Continue exercise to improve strength  and stamina.    Row Name 03/03/24 1635 03/08/24 0943 03/15/24 1332         Exercise Goal Re-Evaluation   Exercise Goals Review Increase Physical Activity;Increase Strength and Stamina;Understanding of Exercise Prescription Increase Physical Activity;Able to understand and use rate of perceived exertion (RPE) scale;Knowledge and understanding of Target Heart Rate Range (THRR);Understanding of Exercise Prescription;Increase Strength and Stamina;Able to understand and use Dyspnea scale;Able to check pulse independently Increase Physical Activity;Increase Strength and Stamina;Understanding of Exercise Prescription     Comments Jaxiel continues to do well in rehab. He was able to maintain his workload on the treadmill at and 4.5% incline. He was also able to maintain level 1 on the elliptical. We will continue to encourage and monitor his progress in the program. Reviewed home exercise with pt today.  Pt plans to walk and use hand weights for exercise.  Reviewed THR, pulse, RPE, sign and symptoms, pulse oximetery and when to call 911 or MD.  Also discussed weather considerations and indoor options.  Pt voiced understanding. Anibal continues to do well in rehab. He recently increased  his treadmill workload by increasing his incline to 7% while maintaining his speed at 3 mph. He also improved to level 3 on the elliptical and level 7 on the XR. We will continue to monitor his progress in the program.     Expected Outcomes Short: Increase treadmill incline to 5%. Long: Continue exercise to imptove strength and stamina. Short: add 1-2 days a week of exercies at home on off days of cardiac rehab. Long: maintain independent exercise routine upon graduation from cardiac rehab. Short: Continue to progressively increase treadmill workload. Long: Continue exercise to improve strength and stamina.        Discharge Exercise Prescription (Final Exercise Prescription Changes):  Exercise Prescription Changes - 03/15/24 1300       Response to Exercise   Blood Pressure (Admit) 102/60    Blood Pressure (Exit) 108/62    Heart Rate (Admit) 64 bpm    Heart Rate (Exercise) 99 bpm    Heart Rate (Exit) 62 bpm    Rating of Perceived Exertion (Exercise) 11    Symptoms none    Duration Continue with 30 min of aerobic exercise without signs/symptoms of physical distress.    Intensity THRR unchanged      Progression   Progression Continue to progress workloads to maintain intensity without signs/symptoms of physical distress.    Average METs 4.86      Resistance Training   Training Prescription Yes    Weight 10 lb    Reps 10-15      Interval Training   Interval Training No      Treadmill   MPH 3    Grade 7    Minutes 15    METs 6.19      Elliptical   Level 3    Speed 6    Minutes 15    METs 5.5      REL-XR   Level 7    Minutes 15    METs 3.7      Home Exercise Plan   Plans to continue exercise at Home (comment)   walking and hand weights.   Frequency Add 2 additional days to program exercise sessions.    Initial Home Exercises Provided 03/08/24      Oxygen   Maintain Oxygen Saturation 88% or higher          Nutrition:  Target Goals: Understanding of nutrition  guidelines, daily intake of sodium 1500mg , cholesterol 200mg , calories 30% from fat and 7% or less from saturated fats, daily to have 5 or more servings of fruits and vegetables.  Education: All About Nutrition: -Group instruction provided by verbal, written material, interactive activities, discussions, models, and posters to present general guidelines for heart healthy nutrition including fat, fiber, MyPlate, the role of sodium in heart healthy nutrition, utilization of the nutrition label, and utilization of this knowledge for meal planning. Follow up email sent as well. Written material given at graduation. Flowsheet Row Cardiac Rehab from 11/06/2022 in [facility] Cardiac and Pulmonary Rehab  Education need identified 09/17/22    Biometrics:  Pre Biometrics - 01/20/24 0923       Pre Biometrics   Height 5' 7.5 (1.715 m)    Weight 162 lb 4.8 oz (73.6 kg)    Waist Circumference 35 inches    Hip Circumference 37.5 inches    Waist to Hip Ratio 0.93 %    BMI (Calculated) 25.03    Single Leg Stand 30 seconds           Nutrition Therapy Plan and Nutrition Goals:  Nutrition Therapy & Goals - 01/20/24 0920       Nutrition Therapy   RD appointment deferred Yes      Intervention Plan   Intervention Prescribe, educate and counsel regarding individualized specific dietary modifications aiming towards targeted core components such as weight, hypertension, lipid management, diabetes, heart failure and other comorbidities.    Expected Outcomes Short Term Goal: Understand basic principles of dietary content, such as calories, fat, sodium, cholesterol and nutrients.;Short Term Goal: A plan has been developed with personal nutrition goals set during dietitian appointment.;Long Term Goal: Adherence to prescribed nutrition plan.          Nutrition Assessments:  MEDIFICTS Score Key: >=70 Need to make dietary changes  40-70 Heart Healthy Diet <= 40 Therapeutic Level Cholesterol  Diet  Flowsheet Row Cardiac Rehab from 01/20/2024 in [facility] Cardiac and Pulmonary Rehab  Picture Your Plate Total Score on Admission 64   Picture Your Plate Scores: <59 Unhealthy dietary pattern with much room for improvement. 41-50 Dietary pattern unlikely to meet recommendations for good health and room for improvement. 51-60 More healthful dietary pattern, with some room for improvement.  >60 Healthy dietary pattern, although there may be some specific behaviors that could be improved.    Nutrition Goals Re-Evaluation:  Nutrition Goals Re-Evaluation     Row Name 02/11/24 9061 02/23/24 0951           Goals   Comment Arley deffered RD appointment Deferred         Nutrition Goals Discharge (Final Nutrition Goals Re-Evaluation):  Nutrition Goals Re-Evaluation - 02/23/24 0951       Goals   Comment Deferred          Psychosocial: Target Goals: Acknowledge presence or absence of significant depression and/or stress, maximize coping skills, provide positive support system. Participant is able to verbalize types and ability to use techniques and skills needed for reducing stress and depression.   Education: Stress, Anxiety, and Depression - Group verbal and visual presentation to define topics covered.  Reviews how body is impacted by stress, anxiety, and depression.  Also discusses healthy ways to reduce stress and to treat/manage anxiety and depression.  Written material given at graduation.   Education: Sleep Hygiene -Provides group verbal and written instruction about how sleep can affect your health.  Define sleep hygiene, discuss sleep  cycles and impact of sleep habits. Review good sleep hygiene tips.    Initial Review & Psychosocial Screening:  Initial Psych Review & Screening - 01/18/24 1340       Initial Review   Current issues with None Identified      Family Dynamics   Good Support System? Yes    Comments He can look to his friends and family for support. He  has done the program last year for stents and is starting [facility] for CABGx3.      Barriers   Psychosocial barriers to participate in program There are no identifiable barriers or psychosocial needs.;The patient should benefit from training in stress management and relaxation.      Screening Interventions   Interventions Encouraged to exercise;Provide feedback about the scores to participant;To provide support and resources with identified psychosocial needs    Expected Outcomes Short Term goal: Utilizing psychosocial counselor, staff and physician to assist with identification of specific Stressors or current issues interfering with healing process. Setting desired goal for each stressor or current issue identified.;Long Term Goal: Stressors or current issues are controlled or eliminated.;Long Term goal: The participant improves quality of Life and PHQ9 Scores as seen by post scores and/or verbalization of changes;Short Term goal: Identification and review with participant of any Quality of Life or Depression concerns found by scoring the questionnaire.          Quality of Life Scores:   Quality of Life - 01/20/24 0919       Quality of Life   Select Quality of Life      Quality of Life Scores   Health/Function Pre 17.5 %    Socioeconomic Pre 21.63 %    Psych/Spiritual Pre 22.5 %    Family Pre 19.6 %    GLOBAL Pre 19.74 %         Scores of 19 and below usually indicate a poorer quality of life in these areas.  A difference of  2-3 points is a clinically meaningful difference.  A difference of 2-3 points in the total score of the Quality of Life Index has been associated with significant improvement in overall quality of life, self-image, physical symptoms, and general health in studies assessing change in quality of life.  PHQ-9: Review Flowsheet  More data may exist      02/23/2024 01/20/2024 11/06/2022 10/09/2022 09/17/2022  Depression screen PHQ 2/9  Decreased Interest 1 1 0 0 0   Down, Depressed, Hopeless 1 1 0 1 1  PHQ - 2 Score 2 2 0 1 1  Altered sleeping 2 2 0 1 3  Tired, decreased energy 2 2 1 1 1   Change in appetite 0 0 0 0 0  Feeling bad or failure about yourself  0 0 0 1 1  Trouble concentrating 0 1 1 1  0  Moving slowly or fidgety/restless 0 0 1 0 1  Suicidal thoughts 0 0 0 0 0  PHQ-9 Score 6 7 3 5 7   Difficult doing work/chores Somewhat difficult Not difficult at all Not difficult at all Somewhat difficult Not difficult at all   Interpretation of Total Score  Total Score Depression Severity:  1-4 = Minimal depression, 5-9 = Mild depression, 10-14 = Moderate depression, 15-19 = Moderately severe depression, 20-27 = Severe depression   Psychosocial Evaluation and Intervention:  Psychosocial Evaluation - 01/18/24 1341       Psychosocial Evaluation & Interventions   Interventions Encouraged to exercise with the program and follow exercise  prescription;Relaxation education;Stress management education    Comments He can look to his friends and family for support. 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    Expected Outcomes Short: Start Heart[facility]K to help with mood. Long: Maintain a healthy mental state    Continue Psychosocial Services  Follow up required by staff          Psychosocial Re-Evaluation:  Psychosocial Re-Evaluation     Row Name 02/11/24 [id number] 02/23/24 0943           Psychosocial Re-Evaluation   Current issues with Current Sleep Concerns Current Sleep Concerns      Comments Jantzen struggles to stay asleep and wakes up 4-5 times per night. Reports he gets 2-7hrs of sleep. He has been working with his Dr to improve it. Saahir denies any stress, depression, or anxiety. He is still struggling with sleep. Has a apnea machine but reports it doesn't help.      Expected Outcomes STG: focus on good sleep hygiene. LTG: Achieve and maintain a positive outlook on health and daily life STG: Focus on good sleep  hygiene. LTG: Achieve and maintain a positive outlook on health and daily life      Interventions Encouraged to attend Cardiac Rehabilitation for the exercise Encouraged to attend Cardiac Rehabilitation for the exercise      Continue Psychosocial Services  Follow up required by staff Follow up required by staff         Psychosocial Discharge (Final Psychosocial Re-Evaluation):  Psychosocial Re-Evaluation - 02/23/24 0943       Psychosocial Re-Evaluation   Current issues with Current Sleep Concerns    Comments Manu denies any stress, depression, or anxiety. He is still struggling with sleep. Has a apnea machine but reports it doesn't help.    Expected Outcomes STG: Focus on good sleep hygiene. LTG: Achieve and maintain a positive outlook on health and daily life    Interventions Encouraged to attend Cardiac Rehabilitation for the exercise    Continue Psychosocial Services  Follow up required by staff          Vocational Rehabilitation: Provide vocational rehab assistance to qualifying candidates.   Vocational Rehab Evaluation & Intervention:   Education: Education Goals: Education classes will be provided on a variety of topics geared toward better understanding of heart health and risk factor modification. Participant will state understanding/return demonstration of topics presented as noted by education test scores.  Learning Barriers/Preferences:  Learning Barriers/Preferences - 01/18/24 1340       Learning Barriers/Preferences   Learning Barriers None    Learning Preferences None          General Cardiac Education Topics:  AED/CPR: - Group verbal and written instruction with the use of models to demonstrate the basic use of the AED with the basic ABC's of resuscitation.   Anatomy and Cardiac Procedures: - Group verbal and visual presentation and models provide information about basic cardiac anatomy and function. Reviews the testing methods done to diagnose heart  disease and the outcomes of the test results. Describes the treatment choices: Medical Management, Angioplasty, or Coronary Bypass Surgery for treating various heart conditions including Myocardial Infarction, Angina, Valve Disease, and Cardiac Arrhythmias.  Written material given at graduation. Flowsheet Row Cardiac Rehab from 01/20/2024 in [facility] Cardiac and Pulmonary Rehab  Education need identified 01/20/24    Medication Safety: - Group verbal and visual instruction to review commonly prescribed medications for heart and lung disease.  Reviews the medication, class of the drug, and side effects. Includes the steps to properly store meds and maintain the prescription regimen.  Written material given at graduation. Flowsheet Row Cardiac Rehab from 11/06/2022 in [facility] Cardiac and Pulmonary Rehab  Date 11/06/22  Educator [facility]  Instruction Review Code 1- Verbalizes Understanding    Intimacy: - Group verbal instruction through game format to discuss how heart and lung disease can affect sexual intimacy. Written material given at graduation..   Know Your Numbers and Heart Failure: - Group verbal and visual instruction to discuss disease risk factors for cardiac and pulmonary disease and treatment options.  Reviews associated critical values for Overweight/Obesity, Hypertension, Cholesterol, and Diabetes.  Discusses basics of heart failure: signs/symptoms and treatments.  Introduces Heart Failure Zone chart for action plan for heart failure.  Written material given at graduation. Flowsheet Row Cardiac Rehab from 01/20/2024 in [facility] Cardiac and Pulmonary Rehab  Education need identified 01/20/24    Infection Prevention: - Provides verbal and written material to individual with discussion of infection control including proper hand washing and proper equipment cleaning during exercise session. Flowsheet Row Cardiac Rehab from 01/20/2024 in [facility] Cardiac and Pulmonary Rehab  Date 01/18/24  Educator jh   Instruction Review Code 1- Verbalizes Understanding    Falls Prevention: - Provides verbal and written material to individual with discussion of falls prevention and safety. Flowsheet Row Cardiac Rehab from 01/20/2024 in [facility] Cardiac and Pulmonary Rehab  Date 01/18/24  Educator jh  Instruction Review Code 1- Verbalizes Understanding    Other: -Provides group and verbal instruction on various topics (see comments) Flowsheet Row Cardiac Rehab from 11/06/2022 in [facility] Cardiac and Pulmonary Rehab  Date 10/30/22  Educator SB  Instruction Review Code 1- Verbalizes Understanding    Knowledge Questionnaire Score:  Knowledge Questionnaire Score - 01/20/24 0919       Knowledge Questionnaire Score   Pre Score 23/26          Core Components/Risk Factors/Patient Goals at Admission:  Personal Goals and Risk Factors at Admission - 01/18/24 1339       Core Components/Risk Factors/Patient Goals on Admission    Weight Management Yes;Weight Maintenance    Intervention Weight Management: Develop a combined nutrition and exercise program designed to reach desired caloric intake, while maintaining appropriate intake of nutrient and fiber, sodium and fats, and appropriate energy expenditure required for the weight goal.;Weight Management: Provide education and appropriate resources to help participant work on and attain dietary goals.;Weight Management/Obesity: Establish reasonable short term and long term weight goals.    Expected Outcomes Short Term: Continue to assess and modify interventions until short term weight is achieved;Weight Maintenance: Understanding of the daily nutrition guidelines, which includes 25-35% calories from fat, 7% or less cal from saturated fats, less than 200mg  cholesterol, less than 1.5gm of sodium, & 5 or more servings of fruits and vegetables daily;Understanding recommendations for meals to include 15-35% energy as protein, 25-35% energy from fat, 35-60% energy from  carbohydrates, less than 200mg  of dietary cholesterol, 20-35 gm of total fiber daily;Understanding of distribution of calorie intake throughout the day with the consumption of 4-5 meals/snacks    Diabetes Yes    Intervention Provide education about signs/symptoms and action to take for hypo/hyperglycemia.;Provide education about proper nutrition, including hydration, and aerobic/resistive exercise prescription along with prescribed medications to achieve blood glucose in normal ranges: Fasting glucose 65-99 mg/dL    Expected Outcomes Short Term: Participant verbalizes understanding of the signs/symptoms and immediate care of  hyper/hypoglycemia, proper foot care and importance of medication, aerobic/resistive exercise and nutrition plan for blood glucose control.;Long Term: Attainment of HbA1C < 7%.    Hypertension Yes    Intervention Provide education on lifestyle modifcations including regular physical activity/exercise, weight management, moderate sodium restriction and increased consumption of fresh fruit, vegetables, and low fat dairy, alcohol moderation, and smoking cessation.;Monitor prescription use compliance.    Expected Outcomes Short Term: Continued assessment and intervention until BP is < 140/39mm HG in hypertensive participants. < 130/25mm HG in hypertensive participants with diabetes, heart failure or chronic kidney disease.;Long Term: Maintenance of blood pressure at goal levels.          Education:Diabetes - Individual verbal and written instruction to review signs/symptoms of diabetes, desired ranges of glucose level fasting, after meals and with exercise. Acknowledge that pre and post exercise glucose checks will be done for 3 sessions at entry of program. Flowsheet Row Cardiac Rehab from 01/20/2024 in [facility] Cardiac and Pulmonary Rehab  Date 01/18/24  Educator jh  Instruction Review Code 1- Verbalizes Understanding    Core Components/Risk Factors/Patient Goals Review:   Goals and  Risk Factor Review     Row Name 02/11/24 0936 02/23/24 0951           Core Components/Risk Factors/Patient Goals Review   Personal Goals Review Hypertension Hypertension      Review Zigmund is checking his blood pressure at home, says it runs consistently around 110/60s. He reports it matches well with the readings here at rehab. Abdiel reports he is checking his BP at home and runs around 110/60. says he feels better when its closer to 120/7o. Encouraged him to drink ~64oz of water , he says he is currently around ~32oz daily.      Expected Outcomes STG: Continue to check BP regularly. LTG: manage risk factors independently STG: Drink more water , check BP at home.  LTG: manage risk factors independently         Core Components/Risk Factors/Patient Goals at Discharge (Final Review):   Goals and Risk Factor Review - 02/23/24 0951       Core Components/Risk Factors/Patient Goals Review   Personal Goals Review Hypertension    Review Martez reports he is checking his BP at home and runs around 110/60. says he feels better when its closer to 120/7o. Encouraged him to drink ~64oz of water , he says he is currently around ~32oz daily.    Expected Outcomes STG: Drink more water , check BP at home.  LTG: manage risk factors independently          ITP Comments:  ITP Comments     Row Name 01/18/24 1339 01/20/24 0917 01/28/24 1029 02/17/24 1041 03/16/24 1035   ITP Comments Virtual Visit completed. Patient informed on EP and RD appointment and 6 Minute walk test. Patient also informed of patient health questionnaires on My Chart. Patient Verbalizes understanding. Visit diagnosis can be found in [facility] 12/10/2023. Completed and gym orientation for cardiac rehab. Initial ITP created and sent for review to Dr. Oneil Pinal, Medical Director. irst full day of exercise!  Patient was oriented to gym and equipment including functions, settings, policies, and procedures.  Patient's individual exercise prescription  and treatment plan were reviewed.  All starting workloads were established based on the results of the 6 minute walk test done at initial orientation visit.  The plan for exercise progression was also introduced and progression will be customized based on patient's performance and goals. 30 Day review completed. Medical  Director ITP review done, changes made as directed, and signed approval by Wellsite geologist.    new to program 30 Day review completed. Medical Director ITP review done, changes made as directed, and signed approval by Medical Director.      Comments: 30 day review

## 2024-03-17 ENCOUNTER — Encounter: Admitting: *Deleted

## 2024-03-17 DIAGNOSIS — Z951 Presence of aortocoronary bypass graft: Secondary | ICD-10-CM | POA: Diagnosis not present

## 2024-03-17 NOTE — Progress Notes (Signed)
 Daily Session Note  Patient Details  Name: Marcus Huang MRN: [id number] Date of Birth: Jun 09, 1951 Referring Provider:   Flowsheet Row Cardiac Rehab from 01/20/2024 in [facility] Cardiac and Pulmonary Rehab  Referring Provider Dr. Marsa Dooms    Encounter Date: 03/17/2024  Check In:  Session Check In - 03/17/24 0952       Check-In   Supervising physician immediately available to respond to emergencies See telemetry face sheet for immediately available ER MD    Location [facility]-Cardiac & Pulmonary Rehab    Staff Present Othel Durand, RN, BSN, CCRP;Maxon Conetta BS, Exercise Physiologist;Jason Elnor RDN,LDN;[employer]    Virtual Visit No    Medication changes reported     No    Fall or balance concerns reported    No    Warm-up and Cool-down Performed on first and last piece of equipment    Resistance Training Performed Yes    VAD Patient? No    PAD/SET Patient? No      Pain Assessment   Currently in Pain? No/denies             Social History   Tobacco Use  Smoking Status Never  Smokeless Tobacco Never    Goals Met:  Independence with exercise equipment Exercise tolerated well No report of concerns or symptoms today  Goals Unmet:  Not Applicable  Comments: Pt able to follow exercise prescription today without complaint.  Will continue to monitor for progression.    Dr. Oneil Pinal is Medical Director for [facility] Cardiac Rehabilitation.  Dr. Fuad Aleskerov is Medical Director for [facility] Pulmonary Rehabilitation.

## 2024-03-22 ENCOUNTER — Encounter

## 2024-03-24 ENCOUNTER — Encounter

## 2024-03-29 ENCOUNTER — Encounter

## 2024-03-29 DIAGNOSIS — Z951 Presence of aortocoronary bypass graft: Secondary | ICD-10-CM

## 2024-03-29 DIAGNOSIS — Z955 Presence of coronary angioplasty implant and graft: Secondary | ICD-10-CM

## 2024-03-29 NOTE — Progress Notes (Signed)
 Daily Session Note  Patient Details  Name: Marcus Huang MRN: [id number] Date of Birth: March 18, 1951 Referring Provider:   Flowsheet Row Cardiac Rehab from 01/20/2024 in [facility] Cardiac and Pulmonary Rehab  Referring Provider Dr. Marsa Dooms    Encounter Date: 03/29/2024  Check In:  Session Check In - 03/29/24 0915       Check-In   Supervising physician immediately available to respond to emergencies See telemetry face sheet for immediately available ER MD    Location [facility]-Cardiac & Pulmonary Rehab    Staff Present Burnard Davenport RN,BSN,MPA;Margaret Best, MS, Exercise Physiologist;Jason Elnor RDN,LDN;Laureen Delores, BS, RRT, CPFT    Virtual Visit No    Medication changes reported     No    Fall or balance concerns reported    No    Tobacco Cessation No Change    Warm-up and Cool-down Performed on first and last piece of equipment    Resistance Training Performed Yes    VAD Patient? No    PAD/SET Patient? No      Pain Assessment   Currently in Pain? No/denies             Social History   Tobacco Use  Smoking Status Never  Smokeless Tobacco Never    Goals Met:  Independence with exercise equipment Exercise tolerated well No report of concerns or symptoms today Strength training completed today  Goals Unmet:  Not Applicable  Comments: Pt able to follow exercise prescription today without complaint.  Will continue to monitor for progression.    Dr. Oneil Pinal is Medical Director for [facility] Cardiac Rehabilitation.  Dr. Fuad Aleskerov is Medical Director for [facility] Pulmonary Rehabilitation.

## 2024-03-31 ENCOUNTER — Encounter: Admitting: *Deleted

## 2024-03-31 DIAGNOSIS — Z951 Presence of aortocoronary bypass graft: Secondary | ICD-10-CM

## 2024-03-31 NOTE — Progress Notes (Signed)
 Daily Session Note  Patient Details  Name: Marcus Huang MRN: [id number] Date of Birth: 12-28-1950 Referring Provider:   Flowsheet Row Cardiac Rehab from 01/20/2024 in [facility] Cardiac and Pulmonary Rehab  Referring Provider Dr. Marsa Dooms    Encounter Date: 03/31/2024  Check In:  Session Check In - 03/31/24 1107       Check-In   Supervising physician immediately available to respond to emergencies See telemetry face sheet for immediately available ER MD    Location [facility]-Cardiac & Pulmonary Rehab    Staff Present Othel Durand, RN, BSN, CCRP;Meredith Tressa RN,BSN;Maxon [place]ston BS, Exercise Physiologist;Jason Elnor RDN,LDN;[employer]    Virtual Visit No    Medication changes reported     No    Fall or balance concerns reported    No    Warm-up and Cool-down Performed on first and last piece of equipment    Resistance Training Performed Yes    VAD Patient? No    PAD/SET Patient? No      Pain Assessment   Currently in Pain? No/denies             Social History   Tobacco Use  Smoking Status Never  Smokeless Tobacco Never    Goals Met:  Independence with exercise equipment Exercise tolerated well No report of concerns or symptoms today  Goals Unmet:  Not Applicable  Comments: Pt able to follow exercise prescription today without complaint.  Will continue to monitor for progression.    Dr. Oneil Pinal is Medical Director for [facility] Cardiac Rehabilitation.  Dr. Fuad Aleskerov is Medical Director for [facility] Pulmonary Rehabilitation.

## 2024-04-05 ENCOUNTER — Encounter

## 2024-04-05 DIAGNOSIS — Z951 Presence of aortocoronary bypass graft: Secondary | ICD-10-CM | POA: Diagnosis not present

## 2024-04-05 NOTE — Progress Notes (Signed)
 Daily Session Note  Patient Details  Name: Marcus Huang MRN: [id number] Date of Birth: 02-May-1951 Referring Provider:   Flowsheet Row Cardiac Rehab from 01/20/2024 in [facility] Cardiac and Pulmonary Rehab  Referring Provider Dr. Marsa Dooms    Encounter Date: 04/05/2024  Check In:  Session Check In - 04/05/24 0921       Check-In   Supervising physician immediately available to respond to emergencies See telemetry face sheet for immediately available ER MD    Location [facility]-Cardiac & Pulmonary Rehab    Staff Present Burnard Davenport RN,BSN,MPA;Maxon Conetta BS, Exercise Physiologist;Margaret Best, MS, Exercise Physiologist;Jason Elnor RDN,LDN    Virtual Visit No    Medication changes reported     No    Fall or balance concerns reported    No    Tobacco Cessation No Change    Warm-up and Cool-down Performed on first and last piece of equipment    Resistance Training Performed Yes    VAD Patient? No    PAD/SET Patient? No      Pain Assessment   Currently in Pain? No/denies             Social History   Tobacco Use  Smoking Status Never  Smokeless Tobacco Never    Goals Met:  Independence with exercise equipment Exercise tolerated well No report of concerns or symptoms today Strength training completed today  Goals Unmet:  Not Applicable  Comments: Pt able to follow exercise prescription today without complaint.  Will continue to monitor for progression.    Dr. Oneil Pinal is Medical Director for [facility] Cardiac Rehabilitation.  Dr. Fuad Aleskerov is Medical Director for [facility] Pulmonary Rehabilitation.

## 2024-04-07 ENCOUNTER — Encounter

## 2024-04-07 DIAGNOSIS — Z951 Presence of aortocoronary bypass graft: Secondary | ICD-10-CM

## 2024-04-07 NOTE — Progress Notes (Signed)
 Daily Session Note  Patient Details  Name: Marcus Huang MRN: [id number] Date of Birth: 04-Jan-1951 Referring Provider:   Flowsheet Row Cardiac Rehab from 01/20/2024 in [facility] Cardiac and Pulmonary Rehab  Referring Provider Dr. Marsa Dooms    Encounter Date: 04/07/2024  Check In:  Session Check In - 04/07/24 0930       Check-In   Supervising physician immediately available to respond to emergencies See telemetry face sheet for immediately available ER MD    Location [facility]-Cardiac & Pulmonary Rehab    Staff Present Burnard Davenport RN,BSN,MPA;Maxon Conetta BS, Exercise Physiologist;Kristen Coble RN,BC,MSN;Margaret Best, MS, Exercise Physiologist    Virtual Visit No    Medication changes reported     No    Fall or balance concerns reported    No    Tobacco Cessation No Change    Warm-up and Cool-down Performed on first and last piece of equipment    Resistance Training Performed Yes    VAD Patient? No    PAD/SET Patient? No      Pain Assessment   Currently in Pain? No/denies             Social History   Tobacco Use  Smoking Status Never  Smokeless Tobacco Never    Goals Met:  Independence with exercise equipment Exercise tolerated well No report of concerns or symptoms today Strength training completed today  Goals Unmet:  Not Applicable  Comments: Pt able to follow exercise prescription today without complaint.  Will continue to monitor for progression.    Dr. Oneil Pinal is Medical Director for [facility] Cardiac Rehabilitation.  Dr. Fuad Aleskerov is Medical Director for [facility] Pulmonary Rehabilitation.

## 2024-04-12 ENCOUNTER — Encounter

## 2024-04-12 DIAGNOSIS — Z951 Presence of aortocoronary bypass graft: Secondary | ICD-10-CM

## 2024-04-12 NOTE — Progress Notes (Signed)
 Daily Session Note  Patient Details  Name: Marcus Huang MRN: [id number] Date of Birth: 02/27/1951 Referring Provider:   Flowsheet Row Cardiac Rehab from 01/20/2024 in [facility]  Referring Provider Dr. Marsa Dooms    Encounter Date: 04/12/2024  Check In:  Session Check In - 04/12/24 0920       Check-In   Supervising physician immediately available to respond to emergencies See telemetry face sheet for immediately available ER MD    Location [facility]    Staff Present Burnard Davenport RN,BSN,MPA;Maxon Conetta BS, Exercise Physiologist;Margaret Best, MS, Exercise Physiologist;Jason Elnor RDN,LDN;Noah Tickle, BS, Exercise Physiologist    Virtual Visit No    Medication changes reported     No    Fall or balance concerns reported    No    Tobacco Cessation No Change    Warm-up and Cool-down Performed on first and last piece of equipment    Resistance Training Performed Yes    VAD Patient? No    PAD/SET Patient? No      Pain Assessment   Currently in Pain? No/denies             Social History   Tobacco Use  Smoking Status Never  Smokeless Tobacco Never    Goals Met:  Independence with exercise equipment Exercise tolerated well No report of concerns or symptoms today Strength training completed today  Goals Unmet:  Not Applicable  Comments: Pt able to follow exercise prescription today without complaint.  Will continue to monitor for progression.    Dr. Oneil Pinal is Medical Director for [facility].  Dr. Fuad Aleskerov is Medical Director for [facility].

## 2024-04-13 DIAGNOSIS — Z951 Presence of aortocoronary bypass graft: Secondary | ICD-10-CM

## 2024-04-13 DIAGNOSIS — Z955 Presence of coronary angioplasty implant and graft: Secondary | ICD-10-CM

## 2024-04-13 NOTE — Progress Notes (Signed)
 Cardiac Individual Treatment Plan  Patient Details  Name: Marcus Huang MRN: [id number] Date of Birth: [birth year] Referring Provider:   Flowsheet Row Cardiac Rehab from 01/20/2024 in [facility] Cardiac and Pulmonary Rehab  Referring Provider Dr. 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 (Patient not taking: Reported on 01/18/2024), Disp: 30 tablet, Rfl: 1   amLODipine  (NORVASC ) 10 MG tablet, Take 1 tablet by mouth daily., Disp: , Rfl:    aspirin  EC 325 MG tablet, Take 1 tablet (325 mg total) by mouth daily. (Patient not taking: Reported on 01/18/2024), Disp: , Rfl:    aspirin  EC 81 MG tablet, Take 81 mg by mouth daily. Swallow whole. (Patient not taking: Reported on 01/18/2024), Disp: , Rfl:    aspirin  EC 81 MG tablet, Take 81 mg by mouth., Disp: , Rfl:    Continuous Glucose Sensor (FREESTYLE LIBRE 3 PLUS SENSOR) MISC, FOLLOW PACKAGE DIRECTIONS CHANGE EVERY 15 DAYS, Disp: , Rfl:    lisinopril  (ZESTRIL ) 5 MG tablet, Take 1 tablet (5 mg total) by mouth daily., Disp: 30 tablet, Rfl: 1   metoprolol  tartrate (LOPRESSOR ) 50 MG tablet, Take 1 tablet (50 mg total) by mouth 2 (two) times daily., Disp: 60 tablet, Rfl: 1   metoprolol  tartrate (LOPRESSOR ) 50 MG tablet, Take 1  tablet by mouth 2 (two) times daily. (Patient not taking: Reported on 01/18/2024), Disp: , Rfl:    Omega-3 Fatty Acids (OMEGA 3 500 PO), Take 500 mg by mouth daily., Disp: , Rfl:    ONETOUCH ULTRA TEST test strip, 1 each 3 (three) times daily., Disp: , Rfl:    oxyCODONE  (OXY IR/ROXICODONE ) 5 MG immediate release tablet, Take 1 tablet (5 mg total) by mouth every 6 (six) hours as needed for severe pain (pain score 7-10)., Disp: 28 tablet, Rfl: 0   PREVIDENT 5000 SENSITIVE 1.1-5 % GEL, Place 1 Application onto teeth 2 (two) times daily., Disp: , Rfl:    REPATHA SURECLICK 140 MG/ML SOAJ, Inject 1 mL into the skin every 14 (fourteen) days., Disp: , Rfl:   Past Medical History: Past Medical History:  Diagnosis Date   Carotid artery thrombosis    Left Carotid Artery   Coronary artery disease    Diabetes (HCC)    Diverticulosis    History of kidney stones    Hyperlipidemia    Hypertension    Renal mass    Sleep apnea    C-PAP    Tobacco Use: Social History   Tobacco Use  Smoking Status Never  Smokeless Tobacco Never    Labs: Review Flowsheet       Latest Ref Rng & Units 12/08/2023 12/10/2023 12/12/2023  Labs for ITP Cardiac  and Pulmonary Rehab  Hemoglobin A1c 4.8 - 5.6 % 5.6  - -  PH, Arterial 7.35 - 7.45 - 7.317  7.356  7.386  7.419  7.416  7.298  7.387  7.376   PCO2 arterial 32 - 48 mmHg - 43.3  41.6  37.1  33.4  35.2  51.0  39.0  35.8   Bicarbonate 20.0 - 28.0 mmol/L - 22.0  23.2  22.7  21.6  22.6  23.1  25.0  23.5  20.9   TCO2 22 - 32 mmol/L - 23  24  24  21  23  23  24  25  24  27  25  24  25  22    Acid-base deficit 0.0 - 2.0 mmol/L - 4.0  2.0  3.0  3.0  2.0  3.0  2.0  1.0  4.0   O2 Saturation % - 93  93  97  100  100  87  100  100  91     Details       Multiple values from one day are sorted in reverse-chronological order          Exercise Target Goals: Exercise Program Goal: Individual exercise prescription set using results from initial 6 min walk test and THRR  while considering  patient's activity barriers and safety.   Exercise Prescription Goal: Initial exercise prescription builds to 30-45 minutes a day of aerobic activity, 2-3 days per week.  Home exercise guidelines will be given to patient during program as part of exercise prescription that the participant will acknowledge.   Education: Aerobic Exercise: - Group verbal and visual presentation on the components of exercise prescription. Introduces F.I.T.T principle from ACSM for exercise prescriptions.  Reviews F.I.T.T. principles of aerobic exercise including progression. Written material given at graduation. Flowsheet Row Cardiac Rehab from 03/31/2024 in [facility] Cardiac and Pulmonary Rehab  Education need identified 01/20/24    Education: Resistance Exercise: - Group verbal and visual presentation on the components of exercise prescription. Introduces F.I.T.T principle from ACSM for exercise prescriptions  Reviews F.I.T.T. principles of resistance exercise including progression. Written material given at graduation. Flowsheet Row Cardiac Rehab from 11/06/2022 in [facility] Cardiac and Pulmonary Rehab  Date 10/09/22  Educator [facility]  Instruction Review Code 1- Verbalizes Understanding     Education: Exercise & Equipment Safety: - Individual verbal instruction and demonstration of equipment use and safety with use of the equipment. Flowsheet Row Cardiac Rehab from 03/31/2024 in [facility] Cardiac and Pulmonary Rehab  Date 01/18/24  Educator [facility]  Instruction Review Code 1- Verbalizes Understanding    Education: Exercise Physiology & General Exercise Guidelines: - Group verbal and written instruction with models to review the exercise physiology of the cardiovascular system and associated critical values. Provides general exercise guidelines with specific guidelines to those with heart or lung disease.    Education: Flexibility, Balance, Mind/Body Relaxation: - Group verbal and visual presentation with  interactive activity on the components of exercise prescription. Introduces F.I.T.T principle from ACSM for exercise prescriptions. Reviews F.I.T.T. principles of flexibility and balance exercise training including progression. Also discusses the mind body connection.  Reviews various relaxation techniques to help reduce and manage stress (i.e. Deep breathing, progressive muscle relaxation, and visualization). Balance handout provided to take home. Written material given at graduation. Flowsheet Row Cardiac Rehab from 11/06/2022 in [facility] Cardiac and Pulmonary Rehab  Date 10/16/22  Educator [facility]  Instruction Review Code 1- Verbalizes Understanding    Activity Barriers & Risk Stratification:  Activity  Barriers & Cardiac Risk Stratification - 01/20/24 9077       Activity Barriers & Cardiac Risk Stratification   Activity Barriers None;Other (comment)    Comments lifting restrictions <10 lb    Cardiac Risk Stratification High          6 Minute Walk:  6 Minute Walk     Row Name 01/20/24 0924         6 Minute Walk   Phase Initial     Distance 1560 feet     Walk Time 6 minutes     # of Rest Breaks 0     MPH 2.95     METS 3.32     RPE 8     Perceived Dyspnea  0     VO2 Peak 11.63     Symptoms No     Resting HR 46 bpm     Resting BP 124/64     Resting Oxygen Saturation  100 %     Exercise Oxygen Saturation  during 6 min walk 99 %     Max Ex. HR 72 bpm     Max Ex. BP 148/66     2 Minute Post BP 140/70        Oxygen Initial Assessment:   Oxygen Re-Evaluation:   Oxygen Discharge (Final Oxygen Re-Evaluation):   Initial Exercise Prescription:  Initial Exercise Prescription - 01/20/24 0900       Date of Initial Exercise RX and Referring Provider   Date 01/20/24    Referring Provider Dr. Marsa Dooms      Oxygen   Maintain Oxygen Saturation 88% or higher      Treadmill   MPH 2.8    Grade 1    Minutes 15    METs 3.53      Elliptical   Level 1    Speed 3     Minutes 15    METs 3.32      REL-XR   Level 3    Speed 50    Minutes 15    METs 3.32      Prescription Details   Frequency (times per week) 2    Duration Progress to 30 minutes of continuous aerobic without signs/symptoms of physical distress      Intensity   THRR 40-80% of Max Heartrate 87-129    Ratings of Perceived Exertion 11-13    Perceived Dyspnea 0-4      Progression   Progression Continue to progress workloads to maintain intensity without signs/symptoms of physical distress.      Resistance Training   Training Prescription Yes    Weight 4 lb    Reps 10-15          Perform Capillary Blood Glucose checks as needed.  Exercise Prescription Changes:   Exercise Prescription Changes     Row Name 01/20/24 0900 02/04/24 1400 02/18/24 1600 03/03/24 1600 03/08/24 0900     Response to Exercise   Blood Pressure (Admit) 124/64 110/60 122/62 118/62 --   Blood Pressure (Exercise) 148/66 136/64 144/76 148/60 --   Blood Pressure (Exit) 140/70 108/58 114/66 124/66 --   Heart Rate (Admit) 46 bpm 43 bpm 53 bpm 68 bpm --   Heart Rate (Exercise) 72 bpm 107 bpm 91 bpm 93 bpm --   Heart Rate (Exit) 48 bpm 50 bpm 62 bpm 50 bpm --   Oxygen Saturation (Admit) 100 % -- -- -- --   Oxygen Saturation (Exercise) 99 % -- -- -- --  Rating of Perceived Exertion (Exercise) 8 13 14 12  --   Perceived Dyspnea (Exercise) 0 0 -- -- --   Symptoms none none none none --   Comments Results first two weeks of exercise -- -- --   Duration -- Progress to 30 minutes of  aerobic without signs/symptoms of physical distress Continue with 30 min of aerobic exercise without signs/symptoms of physical distress. Continue with 30 min of aerobic exercise without signs/symptoms of physical distress. Continue with 30 min of aerobic exercise without signs/symptoms of physical distress.   Intensity -- THRR unchanged THRR unchanged THRR unchanged THRR unchanged     Progression   Progression -- Continue to  progress workloads to maintain intensity without signs/symptoms of physical distress. Continue to progress workloads to maintain intensity without signs/symptoms of physical distress. Continue to progress workloads to maintain intensity without signs/symptoms of physical distress. Continue to progress workloads to maintain intensity without signs/symptoms of physical distress.   Average METs -- 4.02 4.27 4.47 4.47     Resistance Training   Training Prescription -- Yes Yes Yes Yes   Weight -- 4lb 4 lb 10lb 10lb   Reps -- 10-15 10-15 10-15 10-15     Interval Training   Interval Training -- No No No No     Treadmill   MPH -- 2.9 3 3 3    Grade -- 2 4.5 4.5 4.5   Minutes -- 15 15 15 15    METs -- 4.02 5.16 5.16 5.16     Elliptical   Level -- -- 1 1 1    Speed -- -- 3 -- --   Minutes -- -- 15 15 15    METs -- -- 3.1 4.2 4.2     REL-XR   Level -- 5 6 5 5    Minutes -- 15 15 15 15    METs -- -- -- 3.6 3.6     Home Exercise Plan   Plans to continue exercise at -- -- -- -- Home (comment)  walking and hand weights.   Frequency -- -- -- -- Add 2 additional days to program exercise sessions.   Initial Home Exercises Provided -- -- -- -- 03/08/24     Oxygen   Maintain Oxygen Saturation -- 88% or higher 88% or higher 88% or higher 88% or higher    Row Name 03/15/24 1300 03/30/24 1300 04/12/24 0800         Response to Exercise   Blood Pressure (Admit) 102/60 126/58 124/62     Blood Pressure (Exit) 108/62 110/58 124/64     Heart Rate (Admit) 64 bpm 74 bpm 61 bpm     Heart Rate (Exercise) 99 bpm 97 bpm 120 bpm     Heart Rate (Exit) 62 bpm 66 bpm 77 bpm     Rating of Perceived Exertion (Exercise) 11 12 13      Symptoms none none none     Duration Continue with 30 min of aerobic exercise without signs/symptoms of physical distress. Continue with 30 min of aerobic exercise without signs/symptoms of physical distress. Continue with 30 min of aerobic exercise without signs/symptoms of physical  distress.     Intensity THRR unchanged THRR unchanged THRR unchanged       Progression   Progression Continue to progress workloads to maintain intensity without signs/symptoms of physical distress. Continue to progress workloads to maintain intensity without signs/symptoms of physical distress. Continue to progress workloads to maintain intensity without signs/symptoms of physical distress.     Average METs  4.86 4.92 5.25       Resistance Training   Training Prescription Yes Yes Yes     Weight 10 lb 10 lb 10 lb     Reps 10-15 10-15 10-15       Interval Training   Interval Training No No No       Treadmill   MPH 3 3 3      Grade 7 6.5 7     Minutes 15 15 15      METs 6.19 5.99 6.19       Elliptical   Level 3 3 3      Speed 6 0 0     Minutes 15 15 15      METs 5.5 4.1 4.5       REL-XR   Level 7 -- --     Minutes 15 -- --     METs 3.7 -- --       Rower   Level -- 5 7     Watts -- 25 44     Minutes -- 15 15     METs -- 4.23 4.23       Home Exercise Plan   Plans to continue exercise at Home (comment)  walking and hand weights. Home (comment)  walking and hand weights. Home (comment)  walking and hand weights.     Frequency Add 2 additional days to program exercise sessions. Add 2 additional days to program exercise sessions. Add 2 additional days to program exercise sessions.     Initial Home Exercises Provided 03/08/24 03/08/24 03/08/24       Oxygen   Maintain Oxygen Saturation 88% or higher 88% or higher 88% or higher        Exercise Comments:   Exercise Comments     Row Name 01/28/24 1029           Exercise Comments irst full day of exercise!  Patient was oriented to gym and equipment including functions, settings, policies, and procedures.  Patient's individual exercise prescription and treatment plan were reviewed.  All starting workloads were established based on the results of the 6 minute walk test done at initial orientation visit.  The plan for exercise  progression was also introduced and progression will be customized based on patient's performance and goals.          Exercise Goals and Review:   Exercise Goals     Row Name 01/20/24 0923             Exercise Goals   Increase Physical Activity Yes       Intervention Provide advice, education, support and counseling about physical activity/exercise needs.;Develop an individualized exercise prescription for aerobic and resistive training based on initial evaluation findings, risk stratification, comorbidities and participant's personal goals.       Expected Outcomes Short Term: Attend rehab on a regular basis to increase amount of physical activity.;Long Term: Add in home exercise to make exercise part of routine and to increase amount of physical activity.;Long Term: Exercising regularly at least 3-5 days a week.       Increase Strength and Stamina Yes       Intervention Provide advice, education, support and counseling about physical activity/exercise needs.;Develop an individualized exercise prescription for aerobic and resistive training based on initial evaluation findings, risk stratification, comorbidities and participant's personal goals.       Expected Outcomes Short Term: Increase workloads from initial exercise prescription for resistance, speed, and METs.;Short Term: Perform resistance training  exercises routinely during rehab and add in resistance training at home;Long Term: Improve cardiorespiratory fitness, muscular endurance and strength as measured by increased METs and functional capacity ( )       Able to understand and use rate of perceived exertion (RPE) scale Yes       Intervention Provide education and explanation on how to use RPE scale       Expected Outcomes Short Term: Able to use RPE daily in rehab to express subjective intensity level;Long Term:  Able to use RPE to guide intensity level when exercising independently       Able to understand and use Dyspnea scale  Yes       Intervention Provide education and explanation on how to use Dyspnea scale       Expected Outcomes Short Term: Able to use Dyspnea scale daily in rehab to express subjective sense of shortness of breath during exertion;Long Term: Able to use Dyspnea scale to guide intensity level when exercising independently       Knowledge and understanding of Target Heart Rate Range (THRR) Yes       Intervention Provide education and explanation of THRR including how the numbers were predicted and where they are located for reference       Expected Outcomes Short Term: Able to state/look up THRR;Long Term: Able to use THRR to govern intensity when exercising independently;Short Term: Able to use daily as guideline for intensity in rehab       Able to check pulse independently Yes       Intervention Provide education and demonstration on how to check pulse in carotid and radial arteries.;Review the importance of being able to check your own pulse for safety during independent exercise       Expected Outcomes Short Term: Able to explain why pulse checking is important during independent exercise       Understanding of Exercise Prescription Yes       Intervention Provide education, explanation, and written materials on patient's individual exercise prescription       Expected Outcomes Short Term: Able to explain program exercise prescription;Long Term: Able to explain home exercise prescription to exercise independently          Exercise Goals Re-Evaluation :  Exercise Goals Re-Evaluation     Row Name 01/28/24 1029 02/04/24 1424 02/11/24 0941 02/18/24 1609 02/23/24 0939     Exercise Goal Re-Evaluation   Exercise Goals Review -- Increase Physical Activity;Increase Strength and Stamina;Understanding of Exercise Prescription Increase Physical Activity;Increase Strength and Stamina;Understanding of Exercise Prescription Increase Physical Activity;Increase Strength and Stamina;Understanding of Exercise  Prescription Increase Physical Activity;Increase Strength and Stamina;Understanding of Exercise Prescription   Comments Reviewed RPE and dyspnea scale, THR and program prescription with pt today.  Pt voiced understanding and was given a copy of goals to take home. Marcus Huang is off to a good start in the program. He was able to attend his first and only session during this review period. During his session he was able to use the treadmill at a speed of 2.9 mph and an incline of 2%, as well as the XR at level 5. We will continue to monitor his progress in the program. Marcus Huang is doing well at rehab, he is increasing workload as able. At home he is doing some hand weight exercise and walking 3-5 times per day Marcus Huang continues to do well in rehab. He increased his treadmill worload to a speed of 3 mph with an incline of 4.5%.  He also began using the elliptical at level 1 and improved to level 6 on the XR. We will continue to monitor his progress in the program. Marcus Huang is doing well at rehab, he is increasing his workload during visits. He says the consistentcy of attending rehab has helped him improve in stamina. He is walking and using handweights most days at home.   Expected Outcomes Short: Use RPE daily to regulate intensity. Long: Follow program prescription in THR. Short: Continue to follow exercise prescription. Long: Continue exercise to improve strength and stamina. STG: Increase workload as able. LTG: Continue exercise to improve strength and stamina. Short: Continue to progressively increase treadmill workload. Long: Continue exercise to improve strength and stamina. STG: Continue to attend rehab and exercise at home. Continue exercise to improve strength and stamina.    Row Name 03/03/24 1635 03/08/24 0943 03/15/24 1332 03/30/24 1329 04/12/24 0805     Exercise Goal Re-Evaluation   Exercise Goals Review Increase Physical Activity;Increase Strength and Stamina;Understanding of Exercise Prescription Increase  Physical Activity;Able to understand and use rate of perceived exertion (RPE) scale;Knowledge and understanding of Target Heart Rate Range (THRR);Understanding of Exercise Prescription;Increase Strength and Stamina;Able to understand and use Dyspnea scale;Able to check pulse independently Increase Physical Activity;Increase Strength and Stamina;Understanding of Exercise Prescription Increase Physical Activity;Increase Strength and Stamina;Understanding of Exercise Prescription Increase Physical Activity;Increase Strength and Stamina;Understanding of Exercise Prescription   Comments Marcus Huang continues to do well in rehab. He was able to maintain his workload on the treadmill at and 4.5% incline. He was also able to maintain level 1 on the elliptical. We will continue to encourage and monitor his progress in the program. Reviewed home exercise with pt today.  Pt plans to walk and use hand weights for exercise.  Reviewed THR, pulse, RPE, sign and symptoms, pulse oximetery and when to call 911 or MD.  Also discussed weather considerations and indoor options.  Pt voiced understanding. Marcus Huang continues to do well in rehab. He recently increased his treadmill workload by increasing his incline to 7% while maintaining his speed at 3 mph. He also improved to level 3 on the elliptical and level 7 on the XR. We will continue to monitor his progress in the program. Marcus Huang is doing well in rehab. He has been able to maintain his workload on the treadmill at a speed of and 6.5% incline. He was recently able to add the rowing machine to his exercise prescription at level 5. We will continue to monitor his progress in the program. Marcus Huang continues to do well in rehab. He increased his treadmill workload back up to an incline of 7% while maintaining a speed of 3 mph. He also improved to level 7 on the rower machine. We will continue to monitor his progress in the program.   Expected Outcomes Short: Increase treadmill incline to  5%. Long: Continue exercise to imptove strength and stamina. Short: add 1-2 days a week of exercies at home on off days of cardiac rehab. Long: maintain independent exercise routine upon graduation from cardiac rehab. Short: Continue to progressively increase treadmill workload. Long: Continue exercise to improve strength and stamina. Short: Continue to progressively increase treadmill workload. Long: Continue exercise to improve strength and stamina. Short: Continue to progressively increase treadmill workload. Long: Continue exercise to improve strength and stamina.    Row Name 04/12/24 1000             Exercise Goal Re-Evaluation   Exercise Goals Review Increase Physical  Activity;Increase Strength and Stamina;Understanding of Exercise Prescription       Comments Marcus Huang continues to exercise at home. He has a walking pad at home that he uses everyday that he doesnt attend the program. He also has access to handweights at home that he uses, and tries to do the same workouts as in the program.       Expected Outcomes Short: Continue to exercise at home. Long: Exercise in the program and at home to improve strength and stamina.          Discharge Exercise Prescription (Final Exercise Prescription Changes):  Exercise Prescription Changes - 04/12/24 0800       Response to Exercise   Blood Pressure (Admit) 124/62    Blood Pressure (Exit) 124/64    Heart Rate (Admit) 61 bpm    Heart Rate (Exercise) 120 bpm    Heart Rate (Exit) 77 bpm    Rating of Perceived Exertion (Exercise) 13    Symptoms none    Duration Continue with 30 min of aerobic exercise without signs/symptoms of physical distress.    Intensity THRR unchanged      Progression   Progression Continue to progress workloads to maintain intensity without signs/symptoms of physical distress.    Average METs 5.25      Resistance Training   Training Prescription Yes    Weight 10 lb    Reps 10-15      Interval Training   Interval  Training No      Treadmill   MPH 3    Grade 7    Minutes 15    METs 6.19      Elliptical   Level 3    Speed 0    Minutes 15    METs 4.5      Rower   Level 7    Watts 44    Minutes 15    METs 4.23      Home Exercise Plan   Plans to continue exercise at Home (comment)   walking and hand weights.   Frequency Add 2 additional days to program exercise sessions.    Initial Home Exercises Provided 03/08/24      Oxygen   Maintain Oxygen Saturation 88% or higher          Nutrition:  Target Goals: Understanding of nutrition guidelines, daily intake of sodium 1500mg , cholesterol 200mg , calories 30% from fat and 7% or less from saturated fats, daily to have 5 or more servings of fruits and vegetables.  Education: All About Nutrition: -Group instruction provided by verbal, written material, interactive activities, discussions, models, and posters to present general guidelines for heart healthy nutrition including fat, fiber, MyPlate, the role of sodium in heart healthy nutrition, utilization of the nutrition label, and utilization of this knowledge for meal planning. Follow up email sent as well. Written material given at graduation. Flowsheet Row Cardiac Rehab from 11/06/2022 in [facility] Cardiac and Pulmonary Rehab  Education need identified 09/17/22    Biometrics:  Pre Biometrics - 01/20/24 0923       Pre Biometrics   Height 5' 7.5 (1.715 m)    Weight 162 lb 4.8 oz (73.6 kg)    Waist Circumference 35 inches    Hip Circumference 37.5 inches    Waist to Hip Ratio 0.93 %    BMI (Calculated) 25.03    Single Leg Stand 30 seconds           Nutrition Therapy Plan and Nutrition Goals:  Nutrition  Therapy & Goals - 01/20/24 0920       Nutrition Therapy   RD appointment deferred Yes      Intervention Plan   Intervention Prescribe, educate and counsel regarding individualized specific dietary modifications aiming towards targeted core components such as weight, hypertension,  lipid management, diabetes, heart failure and other comorbidities.    Expected Outcomes Short Term Goal: Understand basic principles of dietary content, such as calories, fat, sodium, cholesterol and nutrients.;Short Term Goal: A plan has been developed with personal nutrition goals set during dietitian appointment.;Long Term Goal: Adherence to prescribed nutrition plan.          Nutrition Assessments:  MEDIFICTS Score Key: >=70 Need to make dietary changes  40-70 Heart Healthy Diet <= 40 Therapeutic Level Cholesterol Diet  Flowsheet Row Cardiac Rehab from 01/20/2024 in [facility] Cardiac and Pulmonary Rehab  Picture Your Plate Total Score on Admission 64   Picture Your Plate Scores: <59 Unhealthy dietary pattern with much room for improvement. 41-50 Dietary pattern unlikely to meet recommendations for good health and room for improvement. 51-60 More healthful dietary pattern, with some room for improvement.  >60 Healthy dietary pattern, although there may be some specific behaviors that could be improved.    Nutrition Goals Re-Evaluation:  Nutrition Goals Re-Evaluation     Row Name 02/11/24 [phone] 02/23/24 0951 04/12/24 0950         Goals   Current Weight -- -- 170 lb 9.6 oz (77.4 kg)     Comment Marcus Huang deffered RD appointment Deferred Deferred        Nutrition Goals Discharge (Final Nutrition Goals Re-Evaluation):  Nutrition Goals Re-Evaluation - 04/12/24 0950       Goals   Current Weight 170 lb 9.6 oz (77.4 kg)    Comment Deferred          Psychosocial: Target Goals: Acknowledge presence or absence of significant depression and/or stress, maximize coping skills, provide positive support system. Participant is able to verbalize types and ability to use techniques and skills needed for reducing stress and depression.   Education: Stress, Anxiety, and Depression - Group verbal and visual presentation to define topics covered.  Reviews how body is impacted by stress, anxiety,  and depression.  Also discusses healthy ways to reduce stress and to treat/manage anxiety and depression.  Written material given at graduation. Flowsheet Row Cardiac Rehab from 03/31/2024 in [facility] Cardiac and Pulmonary Rehab  Date 03/31/24  Educator SB  Instruction Review Code 1- [employer] Understanding    Education: Sleep Hygiene -Provides group verbal and written instruction about how sleep can affect your health.  Define sleep hygiene, discuss sleep cycles and impact of sleep habits. Review good sleep hygiene tips.    Initial Review & Psychosocial Screening:  Initial Psych Review & Screening - 01/18/24 1340       Initial Review   Current issues with None Identified      Family Dynamics   Good Support System? Yes    Comments He can look to his friends and family for support. 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      Barriers   Psychosocial barriers to participate in program There are no identifiable barriers or psychosocial needs.;The patient should benefit from training in stress management and relaxation.      Screening Interventions   Interventions Encouraged to exercise;Provide feedback about the scores to participant;To provide support and resources with identified psychosocial needs  Expected Outcomes Short Term goal: Utilizing psychosocial counselor, staff and physician to assist with identification of specific Stressors or current issues interfering with healing process. Setting desired goal for each stressor or current issue identified.;Long Term Goal: Stressors or current issues are controlled or eliminated.;Long Term goal: The participant improves quality of Life and PHQ9 Scores as seen by post scores and/or verbalization of changes;Short Term goal: Identification and review with participant of any Quality of Life or Depression concerns found by scoring the questionnaire.          Quality of Life Scores:   Quality of Life -  01/20/24 0919       Quality of Life   Select Quality of Life      Quality of Life Scores   Health/Function Pre 17.5 %    Socioeconomic Pre 21.63 %    Psych/Spiritual Pre 22.5 %    Family Pre 19.6 %    GLOBAL Pre 19.74 %         Scores of 19 and below usually indicate a poorer quality of life in these areas.  A difference of  2-3 points is a clinically meaningful difference.  A difference of 2-3 points in the total score of the Quality of Life Index has been associated with significant improvement in overall quality of life, self-image, physical symptoms, and general health in studies assessing change in quality of life.  PHQ-9: Review Flowsheet  More data may exist      02/23/2024 01/20/2024 11/06/2022 10/09/2022 09/17/2022  Depression screen PHQ 2/9  Decreased Interest 1 1 0 0 0  Down, Depressed, Hopeless 1 1 0 1 1  PHQ - 2 Score 2 2 0 1 1  Altered sleeping 2 2 0 1 3  Tired, decreased energy 2 2 1 1 1   Change in appetite 0 0 0 0 0  Feeling bad or failure about yourself  0 0 0 1 1  Trouble concentrating 0 1 1 1  0  Moving slowly or fidgety/restless 0 0 1 0 1  Suicidal thoughts 0 0 0 0 0  PHQ-9 Score 6 7 3 5 7   Difficult doing work/chores Somewhat difficult Not difficult at all Not difficult at all Somewhat difficult Not difficult at all   Interpretation of Total Score  Total Score Depression Severity:  1-4 = Minimal depression, 5-9 = Mild depression, 10-14 = Moderate depression, 15-19 = Moderately severe depression, 20-27 = Severe depression   Psychosocial Evaluation and Intervention:  Psychosocial Evaluation - 01/18/24 1341       Psychosocial Evaluation & Interventions   Interventions Encouraged to exercise with the program and follow exercise prescription;Relaxation education;Stress management education    Comments He can look to his friends and family for support. 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    Expected Outcomes Short:  Start HeartTracK to help with mood. Long: Maintain a healthy mental state    Continue Psychosocial Services  Follow up required by staff          Psychosocial Re-Evaluation:  Psychosocial Re-Evaluation     Row Name 02/11/24 [phone] 02/23/24 0943 04/12/24 0946         Psychosocial Re-Evaluation   Current issues with Current Sleep Concerns Current Sleep Concerns Current Sleep Concerns     Comments Job struggles to stay asleep and wakes up 4-5 times per night. Reports he gets 2-7hrs of sleep. He has been working with his Dr to improve it. Marcus Huang  denies any stress, depression, or anxiety. He is still struggling with sleep. Has a apnea machine but reports it doesn't help. Marcus Huang is not dealing with any stressors at this time. He reports that rehab has drastically improved his sleep habits. He has returned to sleeping in his bed as opposed to the recliner.     Expected Outcomes STG: focus on good sleep hygiene. LTG: Achieve and maintain a positive outlook on health and daily life STG: Focus on good sleep hygiene. LTG: Achieve and maintain a positive outlook on health and daily life Short: Continue to improve sleeping habits, manage stressors in healthy means. Long: Continue to attend rehab to help manage stressors.     Interventions Encouraged to attend Cardiac Rehabilitation for the exercise Encouraged to attend Cardiac Rehabilitation for the exercise Encouraged to attend Cardiac Rehabilitation for the exercise     Continue Psychosocial Services  Follow up required by staff Follow up required by staff Follow up required by staff        Psychosocial Discharge (Final Psychosocial Re-Evaluation):  Psychosocial Re-Evaluation - 04/12/24 0946       Psychosocial Re-Evaluation   Current issues with Current Sleep Concerns    Comments Marcus Huang is not dealing with any stressors at this time. He reports that rehab has drastically improved his sleep habits. He has returned to sleeping in his bed as opposed to the  recliner.    Expected Outcomes Short: Continue to improve sleeping habits, manage stressors in healthy means. Long: Continue to attend rehab to help manage stressors.    Interventions Encouraged to attend Cardiac Rehabilitation for the exercise    Continue Psychosocial Services  Follow up required by staff          Vocational Rehabilitation: Provide vocational rehab assistance to qualifying candidates.   Vocational Rehab Evaluation & Intervention:   Education: Education Goals: Education classes will be provided on a variety of topics geared toward better understanding of heart health and risk factor modification. Participant will state understanding/return demonstration of topics presented as noted by education test scores.  Learning Barriers/Preferences:  Learning Barriers/Preferences - 01/18/24 1340       Learning Barriers/Preferences   Learning Barriers None    Learning Preferences None          General Cardiac Education Topics:  AED/CPR: - Group verbal and written instruction with the use of models to demonstrate the basic use of the AED with the basic ABC's of resuscitation.   Anatomy and Cardiac Procedures: - Group verbal and visual presentation and models provide information about basic cardiac anatomy and function. Reviews the testing methods done to diagnose heart disease and the outcomes of the test results. Describes the treatment choices: Medical Management, Angioplasty, or Coronary Bypass Surgery for treating various heart conditions including Myocardial Infarction, Angina, Valve Disease, and Cardiac Arrhythmias.  Written material given at graduation. Flowsheet Row Cardiac Rehab from 03/31/2024 in [facility] Cardiac and Pulmonary Rehab  Education need identified 01/20/24    Medication Safety: - Group verbal and visual instruction to review commonly prescribed medications for heart and lung disease. Reviews the medication, class of the drug, and side effects. Includes  the steps to properly store meds and maintain the prescription regimen.  Written material given at graduation. Flowsheet Row Cardiac Rehab from 11/06/2022 in [facility] Cardiac and Pulmonary Rehab  Date 11/06/22  Educator [facility]  Instruction Review Code 1- Verbalizes Understanding    Intimacy: - Group verbal instruction through game format to discuss how heart and lung  disease can affect sexual intimacy. Written material given at graduation..   Know Your Numbers and Heart Failure: - Group verbal and visual instruction to discuss disease risk factors for cardiac and pulmonary disease and treatment options.  Reviews associated critical values for Overweight/Obesity, Hypertension, Cholesterol, and Diabetes.  Discusses basics of heart failure: signs/symptoms and treatments.  Introduces Heart Failure Zone chart for action plan for heart failure.  Written material given at graduation. Flowsheet Row Cardiac Rehab from 03/31/2024 in [facility] Cardiac and Pulmonary Rehab  Education need identified 01/20/24    Infection Prevention: - Provides verbal and written material to individual with discussion of infection control including proper hand washing and proper equipment cleaning during exercise session. Flowsheet Row Cardiac Rehab from 03/31/2024 in [facility] Cardiac and Pulmonary Rehab  Date 01/18/24  Educator jh  Instruction Review Code 1- Verbalizes Understanding    Falls Prevention: - Provides verbal and written material to individual with discussion of falls prevention and safety. Flowsheet Row Cardiac Rehab from 03/31/2024 in [facility] Cardiac and Pulmonary Rehab  Date 01/18/24  Educator jh  Instruction Review Code 1- Verbalizes Understanding    Other: -Provides group and verbal instruction on various topics (see comments) Flowsheet Row Cardiac Rehab from 11/06/2022 in [facility] Cardiac and Pulmonary Rehab  Date 10/30/22  Educator SB  Instruction Review Code 1- Verbalizes Understanding    Knowledge Questionnaire  Score:  Knowledge Questionnaire Score - 01/20/24 0919       Knowledge Questionnaire Score   Pre Score 23/26          Core Components/Risk Factors/Patient Goals at Admission:  Personal Goals and Risk Factors at Admission - 01/18/24 1339       Core Components/Risk Factors/Patient Goals on Admission    Weight Management Yes;Weight Maintenance    Intervention Weight Management: Develop a combined nutrition and exercise program designed to reach desired caloric intake, while maintaining appropriate intake of nutrient and fiber, sodium and fats, and appropriate energy expenditure required for the weight goal.;Weight Management: Provide education and appropriate resources to help participant work on and attain dietary goals.;Weight Management/Obesity: Establish reasonable short term and long term weight goals.    Expected Outcomes Short Term: Continue to assess and modify interventions until short term weight is achieved;Weight Maintenance: Understanding of the daily nutrition guidelines, which includes 25-35% calories from fat, 7% or less cal from saturated fats, less than 200mg  cholesterol, less than 1.5gm of sodium, & 5 or more servings of fruits and vegetables daily;Understanding recommendations for meals to include 15-35% energy as protein, 25-35% energy from fat, 35-60% energy from carbohydrates, less than 200mg  of dietary cholesterol, 20-35 gm of total fiber daily;Understanding of distribution of calorie intake throughout the day with the consumption of 4-5 meals/snacks    Diabetes Yes    Intervention Provide education about signs/symptoms and action to take for hypo/hyperglycemia.;Provide education about proper nutrition, including hydration, and aerobic/resistive exercise prescription along with prescribed medications to achieve blood glucose in normal ranges: Fasting glucose 65-99 mg/dL    Expected Outcomes Short Term: Participant verbalizes understanding of the signs/symptoms and immediate  care of hyper/hypoglycemia, proper foot care and importance of medication, aerobic/resistive exercise and nutrition plan for blood glucose control.;Long Term: Attainment of HbA1C < 7%.    Hypertension Yes    Intervention Provide education on lifestyle modifcations including regular physical activity/exercise, weight management, moderate sodium restriction and increased consumption of fresh fruit, vegetables, and low fat dairy, alcohol moderation, and smoking cessation.;Monitor prescription use compliance.    Expected Outcomes  Short Term: Continued assessment and intervention until BP is < 140/32mm HG in hypertensive participants. < 130/66mm HG in hypertensive participants with diabetes, heart failure or chronic kidney disease.;Long Term: Maintenance of blood pressure at goal levels.          Education:Diabetes - Individual verbal and written instruction to review signs/symptoms of diabetes, desired ranges of glucose level fasting, after meals and with exercise. Acknowledge that pre and post exercise glucose checks will be done for 3 sessions at entry of program. Flowsheet Row Cardiac Rehab from 03/31/2024 in [facility] Cardiac and Pulmonary Rehab  Date 01/18/24  Educator jh  Instruction Review Code 1- Verbalizes Understanding    Core Components/Risk Factors/Patient Goals Review:   Goals and Risk Factor Review     Row Name 02/11/24 0936 02/23/24 0951 04/12/24 0951         Core Components/Risk Factors/Patient Goals Review   Personal Goals Review Hypertension Hypertension Hypertension     Review Marcus Huang is checking his blood pressure at home, says it runs consistently around 110/60s. He reports it matches well with the readings here at rehab. Marcus Huang reports he is checking his BP at home and runs around 110/60. says he feels better when its closer to 120/7o. Encouraged him to drink ~64oz of water , he says he is currently around ~32oz daily. Marcus Huang is taking his BP at home when he feels unusual. He states  that the program has made him feel more in control of his BP, and has stayed around 110/60.     Expected Outcomes STG: Continue to check BP regularly. LTG: manage risk factors independently STG: Drink more water , check BP at home.  LTG: manage risk factors independently Short: Check BP at home more frequently. Long: Continue to monitor BP to manage hypertension.        Core Components/Risk Factors/Patient Goals at Discharge (Final Review):   Goals and Risk Factor Review - 04/12/24 0951       Core Components/Risk Factors/Patient Goals Review   Personal Goals Review Hypertension    Review Marcus Huang is taking his BP at home when he feels unusual. He states that the program has made him feel more in control of his BP, and has stayed around 110/60.    Expected Outcomes Short: Check BP at home more frequently. Long: Continue to monitor BP to manage hypertension.          ITP Comments:  ITP Comments     Row Name 01/18/24 1339 01/20/24 0917 01/28/24 1029 02/17/24 1041 03/16/24 1035   ITP Comments Virtual Visit completed. Patient informed on EP and RD appointment and 6 Minute walk test. Patient also informed of patient health questionnaires on My Chart. Patient Verbalizes understanding. Visit diagnosis can be found in [facility] 12/10/2023. Completed and gym orientation for cardiac rehab. Initial ITP created and sent for review to Dr. Oneil Pinal, Medical Director. irst full day of exercise!  Patient was oriented to gym and equipment including functions, settings, policies, and procedures.  Patient's individual exercise prescription and treatment plan were reviewed.  All starting workloads were established based on the results of the 6 minute walk test done at initial orientation visit.  The plan for exercise progression was also introduced and progression will be customized based on patient's performance and goals. 30 Day review completed. Medical Director ITP review done, changes made as directed, and signed  approval by Medical Director.    new to program 30 Day review completed. Medical Director ITP review done, changes made  as directed, and signed approval by Wellsite geologist.    Row Name 04/13/24 0957           ITP Comments 30 Day review completed. Medical Director ITP review done, changes made as directed, and signed approval by Medical Director.          Comments: 30 day review

## 2024-04-14 ENCOUNTER — Encounter

## 2024-04-14 DIAGNOSIS — E7800 Pure hypercholesterolemia, unspecified: Secondary | ICD-10-CM | POA: Diagnosis not present

## 2024-04-14 DIAGNOSIS — I10 Essential (primary) hypertension: Secondary | ICD-10-CM | POA: Diagnosis not present

## 2024-04-14 DIAGNOSIS — I25721 Atherosclerosis of autologous artery coronary artery bypass graft(s) with angina pectoris with documented spasm: Secondary | ICD-10-CM | POA: Diagnosis not present

## 2024-04-14 DIAGNOSIS — E119 Type 2 diabetes mellitus without complications: Secondary | ICD-10-CM | POA: Diagnosis not present

## 2024-04-14 DIAGNOSIS — R002 Palpitations: Secondary | ICD-10-CM | POA: Diagnosis not present

## 2024-04-14 DIAGNOSIS — Z789 Other specified health status: Secondary | ICD-10-CM | POA: Diagnosis not present

## 2024-04-14 DIAGNOSIS — I890 Lymphedema, not elsewhere classified: Secondary | ICD-10-CM | POA: Diagnosis not present

## 2024-04-14 DIAGNOSIS — I739 Peripheral vascular disease, unspecified: Secondary | ICD-10-CM | POA: Diagnosis not present

## 2024-04-14 DIAGNOSIS — Z955 Presence of coronary angioplasty implant and graft: Secondary | ICD-10-CM | POA: Diagnosis not present

## 2024-04-19 ENCOUNTER — Encounter: Attending: Cardiology

## 2024-04-19 DIAGNOSIS — Z955 Presence of coronary angioplasty implant and graft: Secondary | ICD-10-CM | POA: Diagnosis not present

## 2024-04-19 DIAGNOSIS — Z951 Presence of aortocoronary bypass graft: Secondary | ICD-10-CM | POA: Insufficient documentation

## 2024-04-19 DIAGNOSIS — Z48812 Encounter for surgical aftercare following surgery on the circulatory system: Secondary | ICD-10-CM | POA: Insufficient documentation

## 2024-04-19 NOTE — Progress Notes (Signed)
 Daily Session Note  Patient Details  Name: Marcus Huang MRN: [id number] Date of Birth: 16-Jan-1951 Referring Provider:   Flowsheet Row Cardiac Rehab from 01/20/2024 in [facility] Cardiac and Pulmonary Rehab  Referring Provider Dr. Marsa Dooms    Encounter Date: 04/19/2024  Check In:  Session Check In - 04/19/24 9061       Check-In   Supervising physician immediately available to respond to emergencies See telemetry face sheet for immediately available ER MD    Location [facility]-Cardiac & Pulmonary Rehab    Staff Present Burnard Davenport [facility] Dyane BS, ACSM CEP, Exercise Physiologist;Margaret Best, MS, Exercise Physiologist;Jason Elnor RDN,LDN    Virtual Visit No    Medication changes reported     No    Fall or balance concerns reported    No    Tobacco Cessation No Change    Warm-up and Cool-down Performed on first and last piece of equipment    Resistance Training Performed Yes    VAD Patient? No    PAD/SET Patient? No      Pain Assessment   Currently in Pain? No/denies             Social History   Tobacco Use  Smoking Status Never  Smokeless Tobacco Never    Goals Met:  Independence with exercise equipment Exercise tolerated well No report of concerns or symptoms today Strength training completed today  Goals Unmet:  Not Applicable  Comments: Pt able to follow exercise prescription today without complaint.  Will continue to monitor for progression.    Dr. Oneil Pinal is Medical Director for [facility] Cardiac Rehabilitation.  Dr. Fuad Aleskerov is Medical Director for [facility] Pulmonary Rehabilitation.

## 2024-04-21 ENCOUNTER — Encounter: Admitting: *Deleted

## 2024-04-21 DIAGNOSIS — Z951 Presence of aortocoronary bypass graft: Secondary | ICD-10-CM

## 2024-04-21 DIAGNOSIS — Z48812 Encounter for surgical aftercare following surgery on the circulatory system: Secondary | ICD-10-CM | POA: Diagnosis not present

## 2024-04-21 NOTE — Progress Notes (Signed)
 Daily Session Note  Patient Details  Name: Marcus Huang MRN: [id number] Date of Birth: [birth year] Referring Provider:   Flowsheet Row Cardiac Rehab from 01/20/2024 in [facility] Cardiac and Pulmonary Rehab  Referring Provider Dr. Marsa Dooms    Encounter Date: 04/21/2024  Check In:  Session Check In - 04/21/24 1042       Check-In   Supervising physician immediately available to respond to emergencies See telemetry face sheet for immediately available ER MD    Location [facility]-Cardiac & Pulmonary Rehab    Staff Present Rollene Paterson, MS, Exercise Physiologist;Maxon Burnell HECKLE, Exercise Physiologist;Joseph Hood RCP,RRT,BSRT   Leita Franks RN BSN   Virtual Visit No    Medication changes reported     No    Fall or balance concerns reported    No    Warm-up and Cool-down Performed on first and last piece of equipment    Resistance Training Performed Yes    VAD Patient? No    PAD/SET Patient? No      Pain Assessment   Currently in Pain? No/denies             Social History   Tobacco Use  Smoking Status Never  Smokeless Tobacco Never    Goals Met:  Independence with exercise equipment Exercise tolerated well No report of concerns or symptoms today  Goals Unmet:  Not Applicable  Comments: Pt able to follow exercise prescription today without complaint.  Will continue to monitor for progression.    Dr. Oneil Pinal is Medical Director for [facility] Cardiac Rehabilitation.  Dr. Fuad Aleskerov is Medical Director for [facility] Pulmonary Rehabilitation.

## 2024-04-26 ENCOUNTER — Encounter

## 2024-04-26 DIAGNOSIS — Z955 Presence of coronary angioplasty implant and graft: Secondary | ICD-10-CM

## 2024-04-26 DIAGNOSIS — Z951 Presence of aortocoronary bypass graft: Secondary | ICD-10-CM

## 2024-04-26 DIAGNOSIS — Z48812 Encounter for surgical aftercare following surgery on the circulatory system: Secondary | ICD-10-CM | POA: Diagnosis not present

## 2024-04-26 NOTE — Progress Notes (Signed)
 Daily Session Note  Patient Details  Name: Marcus Huang MRN: [id number] Date of Birth: April 13, 1951 Referring Provider:   Flowsheet Row Cardiac Rehab from 01/20/2024 in [facility] Cardiac and Pulmonary Rehab  Referring Provider Dr. Marsa Dooms    Encounter Date: 04/26/2024  Check In:  Session Check In - 04/26/24 0921       Check-In   Supervising physician immediately available to respond to emergencies See telemetry face sheet for immediately available ER MD    Location [facility]-Cardiac & Pulmonary Rehab    Staff Present Maxon Conetta BS, Exercise Physiologist;Finnean Cerami RN,BSN,MPA;Margaret Best, MS, Exercise Physiologist;Jason Elnor RDN,LDN    Virtual Visit No    Medication changes reported     No    Fall or balance concerns reported    No    Tobacco Cessation No Change    Warm-up and Cool-down Performed on first and last piece of equipment    Resistance Training Performed Yes    VAD Patient? No    PAD/SET Patient? No      Pain Assessment   Currently in Pain? No/denies             Social History   Tobacco Use  Smoking Status Never  Smokeless Tobacco Never    Goals Met:  Independence with exercise equipment Exercise tolerated well No report of concerns or symptoms today Strength training completed today  Goals Unmet:  Not Applicable  Comments: Pt able to follow exercise prescription today without complaint.  Will continue to monitor for progression.    Dr. Oneil Pinal is Medical Director for [facility] Cardiac Rehabilitation.  Dr. Fuad Aleskerov is Medical Director for [facility] Pulmonary Rehabilitation.

## 2024-04-28 ENCOUNTER — Encounter: Admitting: Emergency Medicine

## 2024-04-28 DIAGNOSIS — Z48812 Encounter for surgical aftercare following surgery on the circulatory system: Secondary | ICD-10-CM | POA: Diagnosis not present

## 2024-04-28 DIAGNOSIS — Z951 Presence of aortocoronary bypass graft: Secondary | ICD-10-CM

## 2024-04-28 NOTE — Progress Notes (Signed)
 Daily Session Note  Patient Details  Name: Marcus Huang MRN: [id number] Date of Birth: 05-14-1951 Referring Provider:   Flowsheet Row Cardiac Rehab from 01/20/2024 in [facility] Cardiac and Pulmonary Rehab  Referring Provider Dr. Marsa Dooms    Encounter Date: 04/28/2024  Check In:  Session Check In - 04/28/24 0939       Check-In   Supervising physician immediately available to respond to emergencies See telemetry face sheet for immediately available ER MD    Location [facility]-Cardiac & Pulmonary Rehab    Staff Present Othel Durand, RN, BSN, CCRP;Joseph Hood RCP,RRT,BSRT;Maxon [place] BS, Exercise Physiologist;Jason Elnor RDN,LDN    Virtual Visit No    Medication changes reported     No    Fall or balance concerns reported    No    Tobacco Cessation No Change    Warm-up and Cool-down Performed on first and last piece of equipment    Resistance Training Performed Yes    VAD Patient? No    PAD/SET Patient? No      Pain Assessment   Currently in Pain? No/denies             Social History   Tobacco Use  Smoking Status Never  Smokeless Tobacco Never    Goals Met:  Independence with exercise equipment Exercise tolerated well No report of concerns or symptoms today Strength training completed today  Goals Unmet:  Not Applicable  Comments: Pt able to follow exercise prescription today without complaint.  Will continue to monitor for progression.    Dr. Oneil Pinal is Medical Director for [facility] Cardiac Rehabilitation.  Dr. Fuad Aleskerov is Medical Director for [facility] Pulmonary Rehabilitation.

## 2024-05-03 ENCOUNTER — Encounter

## 2024-05-05 ENCOUNTER — Inpatient Hospital Stay: Admitting: Certified Registered Nurse Anesthetist

## 2024-05-05 ENCOUNTER — Emergency Department

## 2024-05-05 ENCOUNTER — Other Ambulatory Visit: Payer: Self-pay

## 2024-05-05 ENCOUNTER — Encounter
Admission: EM | Disposition: A | Discharge status: Home / Self Care | Payer: Self-pay | Source: Home / Self Care | Attending: Emergency Medicine

## 2024-05-05 ENCOUNTER — Encounter

## 2024-05-05 ENCOUNTER — Encounter: Payer: Self-pay | Admitting: Emergency Medicine

## 2024-05-05 ENCOUNTER — Observation Stay
Admission: EM | Admit: 2024-05-05 | Discharge: 2024-05-06 | Disposition: A | Discharge status: Home / Self Care | Attending: Internal Medicine | Admitting: Internal Medicine

## 2024-05-05 DIAGNOSIS — Z7982 Long term (current) use of aspirin: Secondary | ICD-10-CM | POA: Diagnosis not present

## 2024-05-05 DIAGNOSIS — I10 Essential (primary) hypertension: Secondary | ICD-10-CM | POA: Insufficient documentation

## 2024-05-05 DIAGNOSIS — L0390 Cellulitis, unspecified: Secondary | ICD-10-CM | POA: Diagnosis present

## 2024-05-05 DIAGNOSIS — S60551A Superficial foreign body of right hand, initial encounter: Secondary | ICD-10-CM | POA: Diagnosis not present

## 2024-05-05 DIAGNOSIS — L03113 Cellulitis of right upper limb: Secondary | ICD-10-CM | POA: Diagnosis not present

## 2024-05-05 DIAGNOSIS — M7989 Other specified soft tissue disorders: Secondary | ICD-10-CM | POA: Diagnosis not present

## 2024-05-05 DIAGNOSIS — M79644 Pain in right finger(s): Secondary | ICD-10-CM | POA: Diagnosis present

## 2024-05-05 DIAGNOSIS — E119 Type 2 diabetes mellitus without complications: Secondary | ICD-10-CM | POA: Diagnosis not present

## 2024-05-05 DIAGNOSIS — Z951 Presence of aortocoronary bypass graft: Secondary | ICD-10-CM | POA: Insufficient documentation

## 2024-05-05 DIAGNOSIS — S60451A Superficial foreign body of left index finger, initial encounter: Secondary | ICD-10-CM | POA: Diagnosis not present

## 2024-05-05 DIAGNOSIS — L03021 Acute lymphangitis of right finger: Secondary | ICD-10-CM | POA: Diagnosis not present

## 2024-05-05 DIAGNOSIS — I2510 Atherosclerotic heart disease of native coronary artery without angina pectoris: Secondary | ICD-10-CM | POA: Insufficient documentation

## 2024-05-05 DIAGNOSIS — Z8679 Personal history of other diseases of the circulatory system: Secondary | ICD-10-CM | POA: Diagnosis not present

## 2024-05-05 DIAGNOSIS — L03011 Cellulitis of right finger: Principal | ICD-10-CM | POA: Insufficient documentation

## 2024-05-05 DIAGNOSIS — S61230A Puncture wound without foreign body of right index finger without damage to nail, initial encounter: Secondary | ICD-10-CM | POA: Diagnosis not present

## 2024-05-05 DIAGNOSIS — Z23 Encounter for immunization: Secondary | ICD-10-CM | POA: Insufficient documentation

## 2024-05-05 DIAGNOSIS — L089 Local infection of the skin and subcutaneous tissue, unspecified: Secondary | ICD-10-CM | POA: Diagnosis not present

## 2024-05-05 DIAGNOSIS — M65141 Other infective (teno)synovitis, right hand: Secondary | ICD-10-CM | POA: Diagnosis not present

## 2024-05-05 HISTORY — PX: INCISION AND DRAINAGE OF WOUND: SHX1803

## 2024-05-05 LAB — CBG MONITORING, ED: Glucose-Capillary: 91 mg/dL (ref 70–99)

## 2024-05-05 LAB — COMPREHENSIVE METABOLIC PANEL WITH GFR
ALT: 15 U/L (ref 0–44)
AST: 16 U/L (ref 15–41)
Albumin: 4.3 g/dL (ref 3.5–5.0)
Alkaline Phosphatase: 67 U/L (ref 38–126)
Anion gap: 10 (ref 5–15)
BUN: 35 mg/dL — ABNORMAL HIGH (ref 8–23)
CO2: 23 mmol/L (ref 22–32)
Calcium: 8.8 mg/dL — ABNORMAL LOW (ref 8.9–10.3)
Chloride: 106 mmol/L (ref 98–111)
Creatinine, Ser: 1.03 mg/dL (ref 0.61–1.24)
GFR, Estimated: >60 mL/min (ref >60)
Glucose, Bld: 95 mg/dL (ref 70–99)
Potassium: 3.8 mmol/L (ref 3.5–5.1)
Sodium: 139 mmol/L (ref 135–145)
Total Bilirubin: 0.3 mg/dL (ref 0.0–1.2)
Total Protein: 7.3 g/dL (ref 6.5–8.1)

## 2024-05-05 LAB — CBC WITH DIFFERENTIAL/PLATELET
Abs Immature Granulocytes: 0.01 K/uL (ref 0.00–0.07)
Basophils Absolute: 0 K/uL (ref 0.0–0.1)
Basophils Relative: 1 %
Eosinophils Absolute: 0.2 K/uL (ref 0.0–0.5)
Eosinophils Relative: 3 %
HCT: 40.6 % (ref 39.0–52.0)
Hemoglobin: 13.6 g/dL (ref 13.0–17.0)
Immature Granulocytes: 0 %
Lymphocytes Relative: 28 %
Lymphs Abs: 1.6 K/uL (ref 0.7–4.0)
MCH: 29.6 pg (ref 26.0–34.0)
MCHC: 33.5 g/dL (ref 30.0–36.0)
MCV: 88.5 fL (ref 80.0–100.0)
Monocytes Absolute: 0.5 K/uL (ref 0.1–1.0)
Monocytes Relative: 8 %
Neutro Abs: 3.6 K/uL (ref 1.7–7.7)
Neutrophils Relative %: 60 %
Platelets: 195 K/uL (ref 150–400)
RBC: 4.59 MIL/uL (ref 4.22–5.81)
RDW: 14.3 % (ref 11.5–15.5)
WBC: 6 K/uL (ref 4.0–10.5)
nRBC: 0 % (ref 0.0–0.2)

## 2024-05-05 LAB — GLUCOSE, CAPILLARY: Glucose-Capillary: 131 mg/dL — ABNORMAL HIGH (ref 70–99)

## 2024-05-05 LAB — LACTIC ACID, PLASMA: Lactic Acid, Venous: 0.6 mmol/L (ref 0.5–1.9)

## 2024-05-05 SURGERY — IRRIGATION AND DEBRIDEMENT WOUND
Anesthesia: General | Site: Index Finger | Laterality: Right

## 2024-05-05 MED ORDER — FLEET ENEMA RE ENEM: 1.0000 | ENEMA | Freq: Once | RECTAL | Status: DC | PRN

## 2024-05-05 MED ORDER — FENTANYL CITRATE (PF) 100 MCG/2ML IJ SOLN
INTRAMUSCULAR | Status: AC
Start: 2024-05-05 — End: 2024-05-05
  Filled 2024-05-05: qty 2

## 2024-05-05 MED ORDER — ONDANSETRON HCL 4 MG/2ML IJ SOLN
4.0000 mg | Freq: Four times a day (QID) | INTRAMUSCULAR | Status: DC | PRN
Start: 2024-05-05 — End: 2024-05-05

## 2024-05-05 MED ORDER — LABETALOL HCL 5 MG/ML IV SOLN
INTRAVENOUS | Status: AC
  Filled 2024-05-05: qty 4

## 2024-05-05 MED ORDER — DROPERIDOL 2.5 MG/ML IJ SOLN: 0.6250 mg | Freq: Once | INTRAMUSCULAR | Status: DC | PRN

## 2024-05-05 MED ORDER — ONDANSETRON HCL 4 MG/2ML IJ SOLN
4.0000 mg | Freq: Once | INTRAMUSCULAR | Status: AC
  Administered 2024-05-05: 4 mg via INTRAVENOUS
  Filled 2024-05-05: qty 2

## 2024-05-05 MED ORDER — FENTANYL CITRATE (PF) 100 MCG/2ML IJ SOLN
INTRAMUSCULAR | Status: AC
  Filled 2024-05-05: qty 2

## 2024-05-05 MED ORDER — ONDANSETRON HCL 4 MG/2ML IJ SOLN: 4.0000 mg | Freq: Four times a day (QID) | INTRAMUSCULAR | Status: DC | PRN

## 2024-05-05 MED ORDER — SODIUM CHLORIDE 0.9 % IV SOLN
2.0000 g | Freq: Once | INTRAVENOUS | Status: AC
  Administered 2024-05-05: 2 g via INTRAVENOUS
  Filled 2024-05-05: qty 20

## 2024-05-05 MED ORDER — PROPOFOL 1000 MG/100ML IV EMUL
INTRAVENOUS | Status: AC
  Filled 2024-05-05: qty 100

## 2024-05-05 MED ORDER — 0.9 % SODIUM CHLORIDE (POUR BTL) OPTIME
TOPICAL | Status: DC | PRN
  Administered 2024-05-05: 500 mL

## 2024-05-05 MED ORDER — KETOROLAC TROMETHAMINE 15 MG/ML IJ SOLN
15.0000 mg | Freq: Four times a day (QID) | INTRAMUSCULAR | Status: DC | PRN
Start: 2024-05-05 — End: 2024-05-05

## 2024-05-05 MED ORDER — SUCCINYLCHOLINE CHLORIDE 200 MG/10ML IV SOSY
PREFILLED_SYRINGE | INTRAVENOUS | Status: DC | PRN
  Administered 2024-05-05: 100 mg via INTRAVENOUS

## 2024-05-05 MED ORDER — AMIODARONE HCL 200 MG PO TABS
200.0000 mg | ORAL_TABLET | Freq: Every day | ORAL | Status: DC
  Filled 2024-05-05: qty 1

## 2024-05-05 MED ORDER — BISACODYL 10 MG RE SUPP: 10.0000 mg | Freq: Every day | RECTAL | Status: DC | PRN

## 2024-05-05 MED ORDER — AMLODIPINE BESYLATE 5 MG PO TABS
10.0000 mg | ORAL_TABLET | Freq: Every day | ORAL | Status: DC
  Filled 2024-05-05: qty 2

## 2024-05-05 MED ORDER — ACETAMINOPHEN 10 MG/ML IV SOLN
INTRAVENOUS | Status: AC
  Filled 2024-05-05: qty 100

## 2024-05-05 MED ORDER — KETOROLAC TROMETHAMINE 15 MG/ML IJ SOLN
15.0000 mg | Freq: Once | INTRAMUSCULAR | Status: AC
  Administered 2024-05-05: 15 mg via INTRAVENOUS

## 2024-05-05 MED ORDER — ACETAMINOPHEN 10 MG/ML IV SOLN
INTRAVENOUS | Status: DC | PRN
Start: 2024-05-05 — End: 2024-05-05
  Administered 2024-05-05: 1000 mg via INTRAVENOUS

## 2024-05-05 MED ORDER — ENOXAPARIN SODIUM 40 MG/0.4ML IJ SOSY: 40.0000 mg | PREFILLED_SYRINGE | Freq: Every day | INTRAMUSCULAR | Status: DC

## 2024-05-05 MED ORDER — FENTANYL CITRATE PF 50 MCG/ML IJ SOSY
50.0000 ug | PREFILLED_SYRINGE | Freq: Once | INTRAMUSCULAR | Status: AC
  Administered 2024-05-05: 50 ug via INTRAVENOUS
  Filled 2024-05-05: qty 1

## 2024-05-05 MED ORDER — MIDAZOLAM HCL 2 MG/2ML IJ SOLN
INTRAMUSCULAR | Status: AC
Start: 2024-05-05 — End: 2024-05-05
  Filled 2024-05-05: qty 2

## 2024-05-05 MED ORDER — DIPHENHYDRAMINE HCL 12.5 MG/5ML PO ELIX: 12.5000 mg | ORAL_SOLUTION | ORAL | Status: DC | PRN

## 2024-05-05 MED ORDER — HYDRALAZINE HCL 20 MG/ML IJ SOLN
10.0000 mg | Freq: Four times a day (QID) | INTRAMUSCULAR | Status: DC | PRN
  Administered 2024-05-05 – 2024-05-06 (×2): 10 mg via INTRAVENOUS
  Filled 2024-05-05 (×2): qty 1

## 2024-05-05 MED ORDER — ASPIRIN 81 MG PO TBEC
81.0000 mg | DELAYED_RELEASE_TABLET | Freq: Every day | ORAL | Status: DC
  Administered 2024-05-06: 81 mg via ORAL
  Filled 2024-05-05: qty 1

## 2024-05-05 MED ORDER — OXYCODONE HCL 5 MG PO TABS: 5.0000 mg | ORAL_TABLET | ORAL | Status: DC | PRN

## 2024-05-05 MED ORDER — KETOROLAC TROMETHAMINE 15 MG/ML IJ SOLN
7.5000 mg | Freq: Four times a day (QID) | INTRAMUSCULAR | Status: AC
  Administered 2024-05-05 – 2024-05-06 (×4): 7.5 mg via INTRAVENOUS
  Filled 2024-05-05 (×5): qty 1

## 2024-05-05 MED ORDER — CHLORHEXIDINE GLUCONATE 4 % EX SOLN
1.0000 | CUTANEOUS | 1 refills | Status: AC
Start: 2024-05-05 — End: ?

## 2024-05-05 MED ORDER — AMIODARONE HCL 200 MG PO TABS
200.0000 mg | ORAL_TABLET | Freq: Every day | ORAL | Status: DC
  Administered 2024-05-06: 200 mg via ORAL
  Filled 2024-05-05: qty 1

## 2024-05-05 MED ORDER — FENTANYL CITRATE (PF) 100 MCG/2ML IJ SOLN
25.0000 ug | INTRAMUSCULAR | Status: DC | PRN
  Administered 2024-05-05 (×2): 50 ug via INTRAVENOUS

## 2024-05-05 MED ORDER — LISINOPRIL 5 MG PO TABS
5.0000 mg | ORAL_TABLET | Freq: Every day | ORAL | Status: DC
  Administered 2024-05-06: 5 mg via ORAL
  Filled 2024-05-05: qty 1

## 2024-05-05 MED ORDER — DOCUSATE SODIUM 100 MG PO CAPS
100.0000 mg | ORAL_CAPSULE | Freq: Two times a day (BID) | ORAL | Status: DC
  Administered 2024-05-05: 100 mg via ORAL
  Filled 2024-05-05 (×3): qty 1

## 2024-05-05 MED ORDER — LIDOCAINE HCL (CARDIAC) PF 100 MG/5ML IV SOSY
PREFILLED_SYRINGE | INTRAVENOUS | Status: DC | PRN
  Administered 2024-05-05: 100 mg via INTRAVENOUS

## 2024-05-05 MED ORDER — METOCLOPRAMIDE HCL 10 MG PO TABS: 5.0000 mg | ORAL_TABLET | Freq: Three times a day (TID) | ORAL | Status: DC | PRN

## 2024-05-05 MED ORDER — KETOROLAC TROMETHAMINE 15 MG/ML IJ SOLN
INTRAMUSCULAR | Status: AC
  Filled 2024-05-05: qty 1

## 2024-05-05 MED ORDER — ONDANSETRON HCL 4 MG PO TABS: 4.0000 mg | ORAL_TABLET | Freq: Four times a day (QID) | ORAL | Status: DC | PRN

## 2024-05-05 MED ORDER — LABETALOL HCL 5 MG/ML IV SOLN
10.0000 mg | INTRAVENOUS | Status: DC | PRN
  Administered 2024-05-05: 10 mg via INTRAVENOUS

## 2024-05-05 MED ORDER — LACTATED RINGERS IV SOLN: INTRAVENOUS | Status: DC | PRN

## 2024-05-05 MED ORDER — TETANUS-DIPHTH-ACELL PERTUSSIS 5-2.5-18.5 LF-MCG/0.5 IM SUSY
0.5000 mL | PREFILLED_SYRINGE | Freq: Once | INTRAMUSCULAR | Status: AC
  Administered 2024-05-05: 0.5 mL via INTRAMUSCULAR
  Filled 2024-05-05: qty 0.5

## 2024-05-05 MED ORDER — MUPIROCIN 2 % EX OINT: 1.0000 | TOPICAL_OINTMENT | Freq: Two times a day (BID) | CUTANEOUS | 0 refills | Status: AC

## 2024-05-05 MED ORDER — SENNOSIDES-DOCUSATE SODIUM 8.6-50 MG PO TABS: 1.0000 | ORAL_TABLET | Freq: Every evening | ORAL | Status: DC | PRN

## 2024-05-05 MED ORDER — SODIUM CHLORIDE 0.9 % IV SOLN: INTRAVENOUS | Status: DC

## 2024-05-05 MED ORDER — ASPIRIN 81 MG PO TBEC
81.0000 mg | DELAYED_RELEASE_TABLET | Freq: Every day | ORAL | Status: DC
  Filled 2024-05-05: qty 1

## 2024-05-05 MED ORDER — CEFAZOLIN SODIUM-DEXTROSE 2-3 GM-%(50ML) IV SOLR
INTRAVENOUS | Status: DC | PRN
Start: 2024-05-05 — End: 2024-05-05
  Administered 2024-05-05: 2 g via INTRAVENOUS

## 2024-05-05 MED ORDER — LIDOCAINE HCL (PF) 2 % IJ SOLN
INTRAMUSCULAR | Status: AC
  Filled 2024-05-05: qty 5

## 2024-05-05 MED ORDER — INSULIN ASPART 100 UNIT/ML IJ SOLN
0.0000 [IU] | Freq: Three times a day (TID) | INTRAMUSCULAR | Status: DC
  Administered 2024-05-06: 2 [IU] via SUBCUTANEOUS
  Filled 2024-05-05: qty 1

## 2024-05-05 MED ORDER — LABETALOL HCL 5 MG/ML IV SOLN: 20.0000 mg | INTRAVENOUS | Status: DC | PRN

## 2024-05-05 MED ORDER — VANCOMYCIN HCL 1750 MG/350ML IV SOLN
1750.0000 mg | Freq: Once | INTRAVENOUS | Status: AC
  Administered 2024-05-05: 1750 mg via INTRAVENOUS
  Filled 2024-05-05: qty 350

## 2024-05-05 MED ORDER — ONDANSETRON HCL 4 MG PO TABS
4.0000 mg | ORAL_TABLET | Freq: Four times a day (QID) | ORAL | Status: DC | PRN
Start: 2024-05-05 — End: 2024-05-05

## 2024-05-05 MED ORDER — PROPOFOL 10 MG/ML IV BOLUS
INTRAVENOUS | Status: DC | PRN
  Administered 2024-05-05: 50 ug/kg/min via INTRAVENOUS
  Administered 2024-05-05: 100 ug/kg/min via INTRAVENOUS

## 2024-05-05 MED ORDER — LISINOPRIL 5 MG PO TABS
5.0000 mg | ORAL_TABLET | Freq: Every day | ORAL | Status: DC
  Filled 2024-05-05: qty 1

## 2024-05-05 MED ORDER — MIDAZOLAM HCL 2 MG/2ML IJ SOLN
INTRAMUSCULAR | Status: DC | PRN
  Administered 2024-05-05: 2 mg via INTRAVENOUS

## 2024-05-05 MED ORDER — OXYCODONE HCL 5 MG PO TABS
5.0000 mg | ORAL_TABLET | ORAL | Status: DC | PRN
  Administered 2024-05-06 (×2): 5 mg via ORAL
  Filled 2024-05-05 (×2): qty 1

## 2024-05-05 MED ORDER — ENOXAPARIN SODIUM 40 MG/0.4ML IJ SOSY
40.0000 mg | PREFILLED_SYRINGE | INTRAMUSCULAR | Status: DC
  Administered 2024-05-06: 40 mg via SUBCUTANEOUS
  Filled 2024-05-05: qty 0.4

## 2024-05-05 MED ORDER — BUPIVACAINE HCL (PF) 0.5 % IJ SOLN
INTRAMUSCULAR | Status: AC
  Filled 2024-05-05: qty 30

## 2024-05-05 MED ORDER — MAGNESIUM HYDROXIDE 400 MG/5ML PO SUSP: 30.0000 mL | Freq: Every day | ORAL | Status: DC | PRN

## 2024-05-05 MED ORDER — SODIUM CHLORIDE 0.9 % IV SOLN
2.0000 g | INTRAVENOUS | Status: DC
  Administered 2024-05-06: 2 g via INTRAVENOUS
  Filled 2024-05-05: qty 20

## 2024-05-05 MED ORDER — METOPROLOL TARTRATE 25 MG PO TABS
25.0000 mg | ORAL_TABLET | Freq: Two times a day (BID) | ORAL | Status: DC
  Filled 2024-05-05: qty 1

## 2024-05-05 MED ORDER — METOCLOPRAMIDE HCL 5 MG/ML IJ SOLN: 5.0000 mg | Freq: Three times a day (TID) | INTRAMUSCULAR | Status: DC | PRN

## 2024-05-05 MED ORDER — FENTANYL CITRATE (PF) 100 MCG/2ML IJ SOLN
INTRAMUSCULAR | Status: DC | PRN
  Administered 2024-05-05: 100 ug via INTRAVENOUS

## 2024-05-05 MED ORDER — AMLODIPINE BESYLATE 10 MG PO TABS
10.0000 mg | ORAL_TABLET | Freq: Every day | ORAL | Status: DC
  Administered 2024-05-06: 10 mg via ORAL
  Filled 2024-05-05: qty 1

## 2024-05-05 MED ORDER — METOPROLOL TARTRATE 25 MG PO TABS
25.0000 mg | ORAL_TABLET | Freq: Two times a day (BID) | ORAL | Status: DC
  Administered 2024-05-05 – 2024-05-06 (×2): 25 mg via ORAL
  Filled 2024-05-05 (×2): qty 1

## 2024-05-05 MED ORDER — CEFAZOLIN SODIUM 1 G IJ SOLR
INTRAMUSCULAR | Status: AC
  Filled 2024-05-05: qty 20

## 2024-05-05 MED ORDER — ACETAMINOPHEN 325 MG PO TABS: 325.0000 mg | ORAL_TABLET | Freq: Four times a day (QID) | ORAL | Status: DC | PRN

## 2024-05-05 SURGICAL SUPPLY — 38 items
BLADE SURG SZ10 CARB STEEL (BLADE) ×1 IMPLANT
BNDG COHESIVE 4X5 TAN STRL LF (GAUZE/BANDAGES/DRESSINGS) ×1 IMPLANT
BNDG ELASTIC 4INX 5YD STR LF (GAUZE/BANDAGES/DRESSINGS) ×1 IMPLANT
BNDG ESMARCH 4X12 STRL LF (GAUZE/BANDAGES/DRESSINGS) ×1 IMPLANT
CHLORAPREP W/TINT 26 (MISCELLANEOUS) ×1 IMPLANT
CNTNR URN SCR LID CUP LEK RST (MISCELLANEOUS) ×1 IMPLANT
CORD BIP STRL DISP 12FT (MISCELLANEOUS) IMPLANT
CUFF TOURN SGL QUICK 18X4 (TOURNIQUET CUFF) IMPLANT
CUFF TRNQT CYL 24X4X16.5-23 (TOURNIQUET CUFF) IMPLANT
DRAIN PENROSE 12X.25 LTX STRL (MISCELLANEOUS) IMPLANT
ELECTRODE REM PT RTRN 9FT ADLT (ELECTROSURGICAL) ×1 IMPLANT
FORCEPS JEWEL BIP 4-3/4 STR (INSTRUMENTS) IMPLANT
GLOVE BIO SURGEON STRL SZ8 (GLOVE) ×1 IMPLANT
GLOVE INDICATOR 8.0 STRL GRN (GLOVE) ×1 IMPLANT
GLOVE SURG ORTHO 8.5 STRL (GLOVE) ×1 IMPLANT
GOWN STRL REUS W/ TWL LRG LVL3 (GOWN DISPOSABLE) ×1 IMPLANT
GOWN STRL REUS W/ TWL XL LVL3 (GOWN DISPOSABLE) ×1 IMPLANT
IV CATH ANGIO 14GX3.25 ORG (MISCELLANEOUS) IMPLANT
KIT TURNOVER KIT A (KITS) ×1 IMPLANT
LABEL OR SOLS (LABEL) ×1 IMPLANT
MANIFOLD NEPTUNE II (INSTRUMENTS) ×1 IMPLANT
NDL SAFETY ECLIPSE 18X1.5 (NEEDLE) ×1 IMPLANT
NS IRRIG 1000ML POUR BTL (IV SOLUTION) ×1 IMPLANT
NS IRRIG 500ML POUR BTL (IV SOLUTION) IMPLANT
PACK EXTREMITY ARMC (MISCELLANEOUS) ×1 IMPLANT
PAD CAST 4YDX4 CTTN HI CHSV (CAST SUPPLIES) ×1 IMPLANT
PADDING CAST BLEND 4X4 STRL (MISCELLANEOUS) ×1 IMPLANT
PENCIL SMOKE EVACUATOR (MISCELLANEOUS) ×1 IMPLANT
SPLINT CAST 1 STEP 3X12 (MISCELLANEOUS) ×1 IMPLANT
SPONGE T-LAP 18X18 ~~LOC~~+RFID (SPONGE) ×1 IMPLANT
STAPLER SKIN PROX 35W (STAPLE) ×1 IMPLANT
STOCKINETTE BIAS CUT 4 980044 (GAUZE/BANDAGES/DRESSINGS) ×1 IMPLANT
STOCKINETTE IMPERVIOUS 9X36 MD (GAUZE/BANDAGES/DRESSINGS) ×1 IMPLANT
SUT PROLENE 4 0 PS 2 18 (SUTURE) ×2 IMPLANT
SWAB CULTURE AMIES ANAERIB BLU (MISCELLANEOUS) ×1 IMPLANT
SYR 10ML LL (SYRINGE) ×1 IMPLANT
TRAP FLUID SMOKE EVACUATOR (MISCELLANEOUS) ×1 IMPLANT
WATER STERILE IRR 500ML POUR (IV SOLUTION) ×1 IMPLANT

## 2024-05-05 NOTE — Transfer of Care (Signed)
 Immediate Anesthesia Transfer of Care Note  Patient: Marcus Huang  Procedure(s) Performed: IRRIGATION AND DEBRIDEMENT WOUND RIGHT INDEX FINGER, EXPLORATION WITH IRRIGATION AND DEBRIDEMENT OF FLEXOR SHEATH WITH RELEASE OF A-1 PULLEY (Right: Index Finger)  Patient Location: PACU  Anesthesia Type:General  Level of Consciousness: sedated  Airway & Oxygen Therapy: Patient Spontanous Breathing  Post-op Assessment: Report given to RN and Post -op Vital signs reviewed and stable  Post vital signs: Reviewed and stable  Last Vitals:  Vitals Value Taken Time  BP 157/83 05/05/24 14:31  Temp    Pulse 65 05/05/24 14:36  Resp 14 05/05/24 14:36  SpO2 96 % 05/05/24 14:36  Vitals shown include unfiled device data.  Last Pain:  Vitals:   05/05/24 1245  TempSrc: Temporal  PainSc: 4          Complications: There were no known notable events for this encounter.

## 2024-05-05 NOTE — Anesthesia Preprocedure Evaluation (Signed)
 Anesthesia Evaluation  Patient identified by MRN, date of birth, ID band Patient awake    Reviewed: Allergy & Precautions, H&P , NPO status , Patient's Chart, lab work & pertinent test results, reviewed documented beta blocker date and time   History of Anesthesia Complications Negative for: history of anesthetic complications  Airway Mallampati: II  TM Distance: >3 FB Neck ROM: full    Dental  (+) Dental Advidsory Given   Pulmonary neg shortness of breath, sleep apnea , neg COPD, neg recent URI   Pulmonary exam normal breath sounds clear to auscultation       Cardiovascular Exercise Tolerance: Good hypertension, (-) angina + CAD, + Cardiac Stents, + CABG and + Peripheral Vascular Disease  (-) Past MI Normal cardiovascular exam(-) dysrhythmias (-) Valvular Problems/Murmurs Rhythm:regular Rate:Normal     Neuro/Psych negative neurological ROS  negative psych ROS   GI/Hepatic negative GI ROS, Neg liver ROS,,,  Endo/Other  diabetes, Well Controlled    Renal/GU negative Renal ROS  negative genitourinary   Musculoskeletal   Abdominal   Peds  Hematology negative hematology ROS (+)   Anesthesia Other Findings Past Medical History: No date: 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 No date: Coronary artery disease No date: Diabetes (HCC) No date: Diverticulosis No date: History of kidney stones No date: Hyperlipidemia No date: Hypertension No date: Renal mass No date: Sleep apnea     Comment:  C-PAP   Reproductive/Obstetrics negative OB ROS                              Anesthesia Physical Anesthesia Plan  ASA: 3 and emergent  Anesthesia Plan: General   Post-op Pain Management:    Induction: Intravenous, Rapid sequence and Cricoid pressure planned  PONV Risk Score and Plan: 2 and Ondansetron , Dexamethasone, Midazolam  and Treatment may vary due to age or medical  condition  Airway Management Planned: Oral ETT  Additional Equipment:   Intra-op Plan:   Post-operative Plan: Extubation in OR  Informed Consent: I have reviewed the patients History and Physical, chart, labs and discussed the procedure including the risks, benefits and alternatives for the proposed anesthesia with the patient or authorized representative who has indicated his/her understanding and acceptance.     Dental Advisory Given  Plan Discussed with: Anesthesiologist, CRNA and Surgeon  Anesthesia Plan Comments:          Anesthesia Quick Evaluation

## 2024-05-05 NOTE — ED Provider Notes (Signed)
 [facility] Provider Note    Event Date/Time   First MD Initiated Contact with Patient 05/05/24 [phone]     (approximate)   History   Finger Injury   HPI  Marcus Huang is a 73 y.o. male left-hand-dominant male with history of hypertension, diabetes, hyperlipidemia who presents to the emergency department with right hand pain, swelling, redness, warmth progressively worsening over the past couple of days.  He reports 4 days ago he excellently injured his right index finger with a nail gun.  He states the nail went through a board into his finger.  He states initially it was painful, swollen but seem to be improving and then over the past couple days has become more swollen, red, throbbing with red that streaks down the hand towards the wrist.  He denies any known fevers, chills.  He reports his blood sugars have been well-controlled.  Able to fully flex and extend the right index finger.  Unsure of his last tetanus vaccine.   History provided by patient.    Past Medical History:  Diagnosis Date   Carotid artery thrombosis    Left Carotid Artery   Coronary artery disease    Diabetes (HCC)    Diverticulosis    History of kidney stones    Hyperlipidemia    Hypertension    Renal mass    Sleep apnea    C-PAP    Past Surgical History:  Procedure Laterality Date   COLONOSCOPY WITH PROPOFOL  N/A 08/15/2019   Procedure: COLONOSCOPY WITH PROPOFOL ;  Surgeon: Jinny Carmine, MD;  Location: [facility];  Service: Endoscopy;  Laterality: N/A;  sleep apnea   CORONARY ARTERY BYPASS GRAFT N/A 12/10/2023   Procedure: CORONARY ARTERY BYPASS GRAFTING (CABG) xTHREE USING LEFT INTERNAL MAMMARY ARTERY AND ENDOSCOPICALLY HARVESTED RIGHT GREATER SAPPHENOUS VEIN;  Surgeon: Lucas Dorise POUR, MD;  Location: [facility] OR;  Service: Open Heart Surgery;  Laterality: N/A;   CORONARY STENT INTERVENTION N/A 08/05/2022   Procedure: CORONARY STENT INTERVENTION;  Surgeon: Ammon Blunt, MD;  Location: [facility] INVASIVE CV LAB;  Service: Cardiovascular;  Laterality: N/A;   CORONARY STENT INTERVENTION Left 08/28/2022   Procedure: CORONARY STENT INTERVENTION;  Surgeon: Ammon Blunt, MD;  Location: [facility] INVASIVE CV LAB;  Service: Cardiovascular;  Laterality: Left;   EYE SURGERY Bilateral    LASIK EYE SURGERY   INGUINAL HERNIA REPAIR Bilateral 05/12/2017   Procedure: LAPAROSCOPIC BILATERAL INGUINAL HERNIA REPAIR, umbilical hernia repair;  Surgeon: Wonda Charlie BRAVO, MD;  Location: [facility] ORS;  Service: General;  Laterality: Bilateral;   INTRAOPERATIVE TRANSESOPHAGEAL ECHOCARDIOGRAM N/A 12/10/2023   Procedure: ECHOCARDIOGRAM, TRANSESOPHAGEAL, INTRAOPERATIVE;  Surgeon: Lucas Dorise POUR, MD;  Location: [facility] OR;  Service: Open Heart Surgery;  Laterality: N/A;   LEFT HEART CATH AND CORONARY ANGIOGRAPHY N/A 08/05/2022   Procedure: LEFT HEART CATH AND CORONARY ANGIOGRAPHY;  Surgeon: Ammon Blunt, MD;  Location: [facility] INVASIVE CV LAB;  Service: Cardiovascular;  Laterality: N/A;   LEFT HEART CATH AND CORONARY ANGIOGRAPHY Left 11/11/2023   Procedure: LEFT HEART CATH AND CORONARY ANGIOGRAPHY;  Surgeon: Ammon Blunt, MD;  Location: [facility] INVASIVE CV LAB;  Service: Cardiovascular;  Laterality: Left;   TONSILLECTOMY     As a child    MEDICATIONS:  Prior to Admission medications   Medication Sig Start Date End Date Taking? Authorizing Provider  acetaminophen  (TYLENOL ) 325 MG tablet Take 2 tablets (650 mg total) by mouth every 6 (six) hours as needed for mild pain (pain score 1-3). 12/16/23  Raguel Benders S, PA-C  amiodarone  (PACERONE ) 200 MG tablet Take 2 tablets by mouth twice per day for 7 days, then take 1 tablet twice per day for 7 days, then take 1 tablet daily thereafter 12/16/23   Raguel Benders RAMAN, PA-C  amiodarone  (PACERONE ) 200 MG tablet 200 mg. 12/16/23   [provider]  amLODipine  (NORVASC ) 10 MG tablet Take 1 tablet (10 mg total) by mouth  daily. Patient not taking: Reported on 01/18/2024 12/16/23   Raguel Benders RAMAN, PA-C  amLODipine  (NORVASC ) 10 MG tablet Take 1 tablet by mouth daily. 12/16/23   [provider]  aspirin  EC 325 MG tablet Take 1 tablet (325 mg total) by mouth daily. Patient not taking: Reported on 01/18/2024 12/16/23   Raguel Benders RAMAN DEVONNA  aspirin  EC 81 MG tablet Take 81 mg by mouth daily. Swallow whole. Patient not taking: Reported on 01/18/2024    [provider]  aspirin  EC 81 MG tablet Take 81 mg by mouth. 12/28/23 12/27/24  [provider]  Continuous Glucose Sensor (FREESTYLE LIBRE 3 PLUS SENSOR) MISC FOLLOW PACKAGE DIRECTIONS CHANGE EVERY 15 DAYS 12/25/23   [provider]  lisinopril  (ZESTRIL ) 5 MG tablet Take 1 tablet (5 mg total) by mouth daily. 12/16/23   Raguel Benders RAMAN, PA-C  metoprolol  tartrate (LOPRESSOR ) 50 MG tablet Take 1 tablet (50 mg total) by mouth 2 (two) times daily. 12/16/23   Raguel Benders RAMAN, PA-C  metoprolol  tartrate (LOPRESSOR ) 50 MG tablet Take 1 tablet by mouth 2 (two) times daily. Patient not taking: Reported on 01/18/2024 12/16/23   [provider]  Omega-3 Fatty Acids (OMEGA 3 500 PO) Take 500 mg by mouth daily.    [provider]  ONETOUCH ULTRA TEST test strip 1 each 3 (three) times daily.    [provider]  oxyCODONE  (OXY IR/ROXICODONE ) 5 MG immediate release tablet Take 1 tablet (5 mg total) by mouth every 6 (six) hours as needed for severe pain (pain score 7-10). 12/22/23   Dwan Aldo M, PA-C  PREVIDENT 5000 SENSITIVE 1.1-5 % GEL Place 1 Application onto teeth 2 (two) times daily. 07/03/22   [provider]  REPATHA SURECLICK 140 MG/ML SOAJ Inject 1 mL into the skin every 14 (fourteen) days.    [provider]    Physical Exam   Triage Vital Signs: ED Triage Vitals  Encounter Vitals Group     BP 05/05/24 0120 (!) 186/106     Girls Systolic BP Percentile --      Girls Diastolic BP Percentile --       Boys Systolic BP Percentile --      Boys Diastolic BP Percentile --      Pulse Rate 05/05/24 0120 67     Resp 05/05/24 0120 18     Temp 05/05/24 0120 97.8 F (36.6 C)     Temp Source 05/05/24 0120 Oral     SpO2 05/05/24 0120 100 %     Weight 05/05/24 0120 170 lb (77.1 kg)     Height 05/05/24 0120 5' 8 (1.727 m)     Head Circumference --      Peak Flow --      Pain Score 05/05/24 0129 8     Pain Loc --      Pain Education --      Exclude from Growth Chart --     Most recent vital signs: Vitals:   05/05/24 0120  BP: (!) 186/106  Pulse: 67  Resp:  18  Temp: 97.8 F (36.6 C)  SpO2: 100%    CONSTITUTIONAL: Alert, responds appropriately to questions. Well-appearing; well-nourished HEAD: Normocephalic, atraumatic EYES: Conjunctivae clear, pupils appear equal, sclera nonicteric ENT: normal nose; moist mucous membranes NECK: Supple, normal ROM CARD: RRR; S1 and S2 appreciated RESP: Normal chest excursion without splinting or tachypnea; breath sounds clear and equal bilaterally; no wheezes, no rhonchi, no rales, no hypoxia or respiratory distress, speaking full sentences ABD/GI: Non-distended; soft, non-tender, no rebound, no guarding, no peritoneal signs BACK: The back appears normal EXT: Increased redness, warmth noted to the right index finger streaking into the dorsal and volar hand.  No significant pain over the flexor tendon and he is able to fully flex and extend the finger.  He has a small healing punctate wound to the radial aspect of the distal right index finger without purulent drainage.  Normal cap refill.  Normal sensation.  2+ right radial pulse. SKIN: Normal color for age and race; warm; no rash on exposed skin NEURO: Moves all extremities equally, normal speech PSYCH: The patient's mood and manner are appropriate.      ED Results / Procedures / Treatments   LABS: (all labs ordered are listed, but only abnormal results are displayed) Labs Reviewed   COMPREHENSIVE METABOLIC PANEL WITH GFR - Abnormal; Notable for the following components:      Result Value   BUN 35 (*)    Calcium  8.8 (*)    All other components within normal limits  CULTURE, BLOOD (ROUTINE X 2)  CULTURE, BLOOD (ROUTINE X 2)  CBC WITH DIFFERENTIAL/PLATELET  LACTIC ACID, PLASMA     EKG:   RADIOLOGY: My personal review and interpretation of imaging: X-ray shows no fracture.  I have personally reviewed all radiology reports.   DG Finger Index Right Result Date: 05/05/2024 CLINICAL DATA:  Punctured index finger with a nail gun on Saturday now with swelling pain and redness EXAM: RIGHT INDEX FINGER 2+V COMPARISON:  None Available. FINDINGS: Swelling about the right second finger. No acute fracture or dislocation. Tiny linear foreign body in the palmar soft tissues of the distal second finger no evidence of osteomyelitis. IMPRESSION: 1. Swelling about the right second finger. Tiny linear foreign body in the palmar soft tissues of the distal second finger. 2. No acute fracture or dislocation. Electronically Signed   By: Norman Gatlin M.D.   On: 05/05/2024 01:58     PROCEDURES:  Critical Care performed: No   Procedures    IMPRESSION / MDM / ASSESSMENT AND PLAN / ED COURSE  I reviewed the triage vital signs and the nursing notes.    Patient here with right cellulitis after puncture wound from a nail gun.  The patient is on the cardiac monitor to evaluate for evidence of arrhythmia and/or significant heart rate changes.   DIFFERENTIAL DIAGNOSIS (includes but not limited to):   Cellulitis, developing flexor tenosynovitis, abscess, retained foreign body, doubt osteomyelitis   Patient's presentation is most consistent with acute presentation with potential threat to life or bodily function.   PLAN: Labs show no leukocytosis, normal blood glucose, normal lactic.  He does not meet criteria for sepsis but given history of diabetes in an elderly patient with  progressively worsening symptoms, I have recommended admission for IV antibiotics and close monitoring to ensure he does not develop flexor tenosynovitis which may need surgical intervention.  He is comfortable with this plan.  X-ray reviewed and interpreted by myself and the radiologist and shows possible very  small punctate foreign body that I do not feel at this time is amendable to bedside removal.  Will update his tetanus vaccine.  Will give pain medication.  Will discuss with medicine for admission.   MEDICATIONS GIVEN IN ED: Medications  cefTRIAXone  (ROCEPHIN ) 2 g in sodium chloride  0.9 % 100 mL IVPB (2 g Intravenous New Bag/Given 05/05/24 0509)  Tdap (BOOSTRIX) injection 0.5 mL (has no administration in time range)  vancomycin  (VANCOREADY) IVPB 1750 mg/350 mL (has no administration in time range)  fentaNYL  (SUBLIMAZE ) injection 50 mcg (50 mcg Intravenous Given 05/05/24 0505)  ondansetron  (ZOFRAN ) injection 4 mg (4 mg Intravenous Given 05/05/24 0505)       CONSULTS:  Consulted and discussed patient's case with hospitalist, Dr. Lawence.  I have recommended admission and consulting physician agrees and will place admission orders.  Patient (and family if present) agree with this plan.   I reviewed all nursing notes, vitals, pertinent previous records.  All labs, EKGs, imaging ordered have been independently reviewed and interpreted by myself.    OUTSIDE RECORDS REVIEWED: Reviewed recent cardiology, neurology and family medicine notes.       FINAL CLINICAL IMPRESSION(S) / ED DIAGNOSES   Final diagnoses:  Cellulitis of right hand     Rx / DC Orders   ED Discharge Orders     None        Note:  This document was prepared using Dragon voice recognition software and may include unintentional dictation errors.   Toneisha Savary, Josette SAILOR, DO 05/05/24 [phone]

## 2024-05-05 NOTE — Op Note (Signed)
 05/05/2024  2:33 PM  Patient:   Marcus Huang  Pre-Op Diagnosis:   Felon of right index finger with possible suppurative tenosynovitis of right index finger.  Post-Op Diagnosis:   Felon of right index finger.  Procedure:   Irrigation debridement of wound and felon right index, exploration with irrigation and debridement of right index flexor sheath, and release of A1 pulley right index finger.  Surgeon:   DOROTHA Reyes Maltos, MD  Assistant:   None  Anesthesia:   GET  Findings:   As above.  Complications:   None  Fluids:   400 cc crystalloid  EBL:   1 cc  UOP:   None  TT:   22 minutes at 250 mmHg  Drains:   Penrose x 1  Closure:   4-0 Prolene interrupted sutures  Brief Clinical Note:   The patient is a 73 year old male who sustained a nail gun injury to his right index fingertip 4 days ago.  Initially, the patient treated it with some local cleansing and application of a Band-Aid.  However, his symptoms worsened over the past 24 hours or so, prompting him to present to the emergency room.  Subsequently, the patient has been admitted for IV antibiotics.  His history and examination are suspicious for a felon of the distal portion of his index finger with a possible suppurative flexor tenosynovitis.  The patient presents at this time for formal irrigation debridement of the right fingertip wound as well as for exploration with irrigation and debridement of his right index flexor sheath.  Procedure:   The patient was brought into the operating room and lain in the supine position.  After adequate general endotracheal intubation and anesthesia were obtained, the patient's right hand and upper extremity were prepped with ChloraPrep solution before being draped sterilely.  Preoperative antibiotics were administered.  A timeout was performed to verify the appropriate surgical site before the limb was exsanguinated with an Esmarch mark and the tourniquet inflated to 250 mmHg.  An  approximately 1 to 1.5 cm incision was made along the radial aspect of the distal portion of the index finger, ellipsing the scabbed site of his puncture wound.  The incision was carried down through the subcutaneous tissues.  A small amount of purulent material was identified and cultures obtained.  The volar pad was opened and what appeared to be a small metallic fragment was identified and removed.  The wound was copiously irrigated with sterile saline solution using bulb irrigation.  An approximately 1.5 to 2 cm incision was made along the proximal palmar crease centered over the flexor tendon sheath of the index finger.  This incision was carried down through this cutaneous tissues to expose the flexor sheath.  The sheath was entered and a small amount of fluid identified.  This fluid was cultured.  The flexor sheath was milked from distal to proximal, but no obviously purulent material was identified.  The A1 pulley was released and a small amount of tenosynovium debrided and sent for culture.  A 14-gauge Angiocath was passed up through the flexor sheath and the flexor sheath irrigated thoroughly using sterile saline solution.  There did not appear to be any communication with the more distal wound.  The more proximal wound was reapproximated using 4-0 Prolene interrupted sutures.  A Penrose drain was placed in the more distal wound which was left open.  A sterile bulky dressing was applied to the 2 wounds before the patient was awakened, extubated, and returned to  the recovery room in satisfactory condition after tolerating the procedure well.

## 2024-05-05 NOTE — ED Notes (Addendum)
 Pt took his own meds this mornging.  Metoprolol  1 - 1/2 pil .SABRA...unknown mg  Amlodipine  2.5 mg  Linsinopril  5 mg

## 2024-05-05 NOTE — ED Triage Notes (Addendum)
 Patient ambulatory to triage with steady gait, without difficulty or distress noted; pt reports puncturing rt index finger with nail gun on Saturday; now with swelling/pain/redness to finger; 2 ibuprofen taken at 10pm

## 2024-05-05 NOTE — H&P (Signed)
 History and Physical    [name]:[id number] DOB: [birth year] DOA: 05/05/2024  PCP: Jeffie Cheryl BRAVO, MD (Confirm with patient/family/NH records and if not entered, this has to be entered at [facility] point of entry) Patient coming from: Home  I have personally briefly reviewed patient's old medical records in [facility]  Chief Complaint: Right index finger swellign and pain  HPI: Marcus Huang is a 73 y.o. male with medical history significant of HTN, HLD, CAD, IIDM, presented with worsening of right index finger swelling pain.  Symptoms started 4 days ago, patient sustained a accidental nail gun shot while at work.  Penetrate through distal right index finger, and next day, she started to develop pain and swelling of the distal part right index finger 2 days despite hand pain moved to the whole right index finger.  Denied any fever or chills.  He feels heaviness in the finger and hard to bend but denied any numbness of the finger.  ED Course: Afebrile blood pressure 180/110 O2 saturation 100% on room air.  X-ray showed foreign body in the distal part of right index finger, WBC 6.0 13.6 glucose 95.  Patient was given vancomycin  and ceftriaxone   Review of Systems: As per HPI otherwise 14 point review of systems negative.    Past Medical History:  Diagnosis Date   Carotid artery thrombosis    Left Carotid Artery   Coronary artery disease    Diabetes (HCC)    Diverticulosis    History of kidney stones    Hyperlipidemia    Hypertension    Renal mass    Sleep apnea    C-PAP    Past Surgical History:  Procedure Laterality Date   COLONOSCOPY WITH PROPOFOL  N/A 08/15/2019   Procedure: COLONOSCOPY WITH PROPOFOL ;  Surgeon: Jinny Carmine, MD;  Location: [facility];  Service: Endoscopy;  Laterality: N/A;  sleep apnea   CORONARY ARTERY BYPASS GRAFT N/A 12/10/2023   Procedure: CORONARY ARTERY BYPASS GRAFTING (CABG) xTHREE USING LEFT INTERNAL MAMMARY ARTERY AND  ENDOSCOPICALLY HARVESTED RIGHT GREATER SAPPHENOUS VEIN;  Surgeon: Lucas Dorise POUR, MD;  Location: [facility];  Service: Open Heart Surgery;  Laterality: N/A;   CORONARY STENT INTERVENTION N/A 08/05/2022   Procedure: CORONARY STENT INTERVENTION;  Surgeon: Ammon Blunt, MD;  Location: [facility];  Service: Cardiovascular;  Laterality: N/A;   CORONARY STENT INTERVENTION Left 08/28/2022   Procedure: CORONARY STENT INTERVENTION;  Surgeon: Ammon Blunt, MD;  Location: [facility] CV [facility];  Service: Cardiovascular;  Laterality: Left;   EYE SURGERY Bilateral    LASIK EYE SURGERY   INGUINAL HERNIA REPAIR Bilateral 05/12/2017   Procedure: LAPAROSCOPIC BILATERAL INGUINAL HERNIA REPAIR, umbilical hernia repair;  Surgeon: Wonda Charlie BRAVO, MD;  Location: [facility] ORS;  Service: General;  Laterality: Bilateral;   INTRAOPERATIVE TRANSESOPHAGEAL ECHOCARDIOGRAM N/A 12/10/2023   Procedure: ECHOCARDIOGRAM, TRANSESOPHAGEAL, INTRAOPERATIVE;  Surgeon: Lucas Dorise POUR, MD;  Location: [facility];  Service: Open Heart Surgery;  Laterality: N/A;   LEFT HEART CATH AND CORONARY ANGIOGRAPHY N/A 08/05/2022   Procedure: LEFT HEART CATH AND CORONARY ANGIOGRAPHY;  Surgeon: Ammon Blunt, MD;  Location: [facility];  Service: Cardiovascular;  Laterality: N/A;   LEFT HEART CATH AND CORONARY ANGIOGRAPHY Left 11/11/2023   Procedure: LEFT HEART CATH AND CORONARY ANGIOGRAPHY;  Surgeon: Ammon Blunt, MD;  Location: [facility] CV [facility];  Service: Cardiovascular;  Laterality: Left;   TONSILLECTOMY     As a child     reports that he has  never smoked. He has never used smokeless tobacco. He reports that he does not drink alcohol and does not use drugs.  Allergies  Allergen Reactions   Metformin Other (See Comments)    Stomach problems   Rosuvastatin  Other (See Comments)    fatigue   Silicone Dermatitis   Tape Rash    Family History  Problem Relation Age of Onset   Hypertension Father     Stroke Father    Coronary artery disease Maternal Grandmother    Stroke Maternal Grandmother    Diabetes Mother    Hypertension Mother    Stroke Mother    Stroke Paternal Grandmother    Prostate cancer Neg Hx    Bladder Cancer Neg Hx      Prior to Admission medications   Medication Sig Start Date End Date Taking? Authorizing Provider  amLODipine  (NORVASC ) 10 MG tablet Take 1 tablet (10 mg total) by mouth daily. 12/16/23  Yes Raguel Con RAMAN, PA-C  aspirin  EC 81 MG tablet Take 81 mg by mouth daily. Swallow whole.   Yes [provider]  lisinopril  (ZESTRIL ) 5 MG tablet Take 1 tablet (5 mg total) by mouth daily. 12/16/23  Yes Raguel Con RAMAN, PA-C  metoprolol  tartrate (LOPRESSOR ) 50 MG tablet Take 1 tablet (50 mg total) by mouth 2 (two) times daily. Patient taking differently: Take 25 mg by mouth 2 (two) times daily. Take half in the morning and half in the evening 12/16/23  Yes Raguel, Con RAMAN, PA-C  acetaminophen  (TYLENOL ) 325 MG tablet Take 2 tablets (650 mg total) by mouth every 6 (six) hours as needed for mild pain (pain score 1-3). Patient not taking: Reported on 05/05/2024 12/16/23   Raguel Con RAMAN, PA-C  amiodarone  (PACERONE ) 200 MG tablet Take 2 tablets by mouth twice per day for 7 days, then take 1 tablet twice per day for 7 days, then take 1 tablet daily thereafter Patient not taking: Reported on 05/05/2024 12/16/23   Raguel Con RAMAN, PA-C  amiodarone  (PACERONE ) 200 MG tablet 200 mg. 12/16/23   [provider]  amLODipine  (NORVASC ) 10 MG tablet Take 1 tablet by mouth daily. 12/16/23   [provider]  amoxicillin -clavulanate (AUGMENTIN ) 875-125 MG tablet Take 1 tablet by mouth 2 (two) times daily. Patient not taking: Reported on 05/05/2024 03/23/24   [provider]  aspirin  EC 325 MG tablet Take 1 tablet (325 mg total) by mouth daily. Patient not taking: Reported on 01/13/2024 12/16/23   Raguel Con RAMAN DEVONNA  aspirin  EC 81 MG tablet Take 81 mg  by mouth. 12/28/23 12/27/24  [provider]  Continuous Glucose Sensor (FREESTYLE LIBRE 3 PLUS SENSOR) MISC FOLLOW PACKAGE DIRECTIONS CHANGE EVERY 15 DAYS 12/25/23   [provider]  metoprolol  tartrate (LOPRESSOR ) 50 MG tablet Take 1 tablet by mouth 2 (two) times daily. Patient not taking: Reported on 01/18/2024 12/16/23   [provider]  Omega-3 Fatty Acids (OMEGA 3 500 PO) Take 500 mg by mouth daily. Patient not taking: Reported on 05/05/2024    [provider]  [facility] ULTRA TEST test strip 1 each 3 (three) times daily.    [provider]  oxyCODONE  (OXY IR/ROXICODONE ) 5 MG immediate release tablet Take 1 tablet (5 mg total) by mouth every 6 (six) hours as needed for severe pain (pain score 7-10). Patient not taking: Reported on 05/05/2024 12/22/23   Dwan Kyla HERO, PA-C  PREVIDENT 5000 SENSITIVE 1.1-5 % GEL Place 1 Application onto teeth 2 (two) times daily.  07/03/22   [provider]  REPATHA SURECLICK 140 MG/ML SOAJ Inject 1 mL into the skin every 14 (fourteen) days.    [provider]    Physical Exam: Vitals:   05/05/24 0523 05/05/24 0628 05/05/24 0653 05/05/24 0722  BP:  (!) 188/110 (!) 188/110   Pulse: 60 76    Resp: 18 16    Temp:    97.9 F (36.6 C)  TempSrc:    Oral  SpO2: 100% 98%    Weight:      Height:        Constitutional: NAD, calm, comfortable Vitals:   05/05/24 0523 05/05/24 0628 05/05/24 0653 05/05/24 0722  BP:  (!) 188/110 (!) 188/110   Pulse: 60 76    Resp: 18 16    Temp:    97.9 F (36.6 C)  TempSrc:    Oral  SpO2: 100% 98%    Weight:      Height:       Eyes: PERRL, lids and conjunctivae normal ENMT: Mucous membranes are moist. Posterior pharynx clear of any exudate or lesions.Normal dentition.  Neck: normal, supple, no masses, no thyromegaly Respiratory: clear to auscultation bilaterally, no wheezing, no crackles. Normal respiratory effort. No accessory muscle use.  Cardiovascular:  Regular rate and rhythm, no murmurs / rubs / gallops. No extremity edema. 2+ pedal pulses. No carotid bruits.  Abdomen: no tenderness, no masses palpated. No hepatosplenomegaly. Bowel sounds positive.  Musculoskeletal: no clubbing / cyanosis.  Swelling nontender of right index finger, significant decreased ROM of joints Skin: no rashes, lesions, ulcers. No induration Neurologic: CN 2-12 grossly intact. Sensation intact, DTR normal. Strength 5/5 in all 4.  Psychiatric: Normal judgment and insight. Alert and oriented x 3. Normal mood.     Labs on Admission: I have personally reviewed following labs and imaging studies  CBC: Recent Labs  Lab 05/05/24 0135  WBC 6.0  NEUTROABS 3.6  HGB 13.6  HCT 40.6  MCV 88.5  PLT 195   Basic Metabolic Panel: Recent Labs  Lab 05/05/24 0135  NA 139  K 3.8  CL 106  CO2 23  GLUCOSE 95  BUN 35*  CREATININE 1.03  CALCIUM  8.8*   GFR: Estimated Creatinine Clearance: 63.6 mL/min (by C-G formula based on SCr of 1.03 mg/dL). Liver Function Tests: Recent Labs  Lab 05/05/24 0135  AST 16  ALT 15  ALKPHOS 67  BILITOT 0.3  PROT 7.3  ALBUMIN  4.3   No results for input(s): LIPASE, AMYLASE in the last 168 hours. No results for input(s): AMMONIA in the last 168 hours. Coagulation Profile: No results for input(s): INR, PROTIME in the last 168 hours. Cardiac Enzymes: No results for input(s): CKTOTAL, CKMB, CKMBINDEX, TROPONINI in the last 168 hours. BNP (last 3 results) No results for input(s): PROBNP in the last 8760 hours. HbA1C: No results for input(s): HGBA1C in the last 72 hours. CBG: No results for input(s): GLUCAP in the last 168 hours. Lipid Profile: No results for input(s): CHOL, HDL, LDLCALC, TRIG, CHOLHDL, LDLDIRECT in the last 72 hours. Thyroid  Function Tests: No results for input(s): TSH, T4TOTAL, FREET4, T3FREE, THYROIDAB in the last 72 hours. Anemia Panel: No results for input(s):  VITAMINB12, FOLATE, FERRITIN, TIBC, IRON, RETICCTPCT in the last 72 hours. Urine analysis:    Component Value Date/Time   COLORURINE YELLOW 12/08/2023 1355   APPEARANCEUR CLEAR 12/08/2023 1355   APPEARANCEUR Clear 03/25/2017 1515   LABSPEC 1.019 12/08/2023 1355   PHURINE 6.0 12/08/2023 1355  GLUCOSEU NEGATIVE 12/08/2023 1355   HGBUR NEGATIVE 12/08/2023 1355   BILIRUBINUR NEGATIVE 12/08/2023 1355   BILIRUBINUR Negative 03/25/2017 1515   KETONESUR 5 (A) 12/08/2023 1355   PROTEINUR NEGATIVE 12/08/2023 1355   NITRITE NEGATIVE 12/08/2023 1355   LEUKOCYTESUR NEGATIVE 12/08/2023 1355    Radiological Exams on Admission: DG Finger Index Right Result Date: 05/05/2024 CLINICAL DATA:  Punctured index finger with a nail gun on Saturday now with swelling pain and redness EXAM: RIGHT INDEX FINGER 2+V COMPARISON:  None Available. FINDINGS: Swelling about the right second finger. No acute fracture or dislocation. Tiny linear foreign body in the palmar soft tissues of the distal second finger no evidence of osteomyelitis. IMPRESSION: 1. Swelling about the right second finger. Tiny linear foreign body in the palmar soft tissues of the distal second finger. 2. No acute fracture or dislocation. Electronically Signed   By: Norman Gatlin M.D.   On: 05/05/2024 01:58    EKG: None  Assessment/Plan Principal Problem:   Cellulitis of finger of right hand Active Problems:   Cellulitis  (please populate well all problems here in Problem List. (For example, if patient is on BP meds at home and you resume or decide to hold them, it is a problem that needs to be her. Same for CAD, COPD, HLD and so on)  Right index finger cellulitis Likely tenosynovitis development of right finger Right index finger foreign body -D/W hand surgeon, who will be on the consult -Continue vancomycin  and ceftriaxone   HTN, controlled - Home regimen including amlodipine , metoprolol  lisinopril   History of CAD status  post CABG and stenting - Continue aspirin  and Repatha  History of ventricular arrhythmia - Stable, continue amiodarone   Total time spent patient care 55 minutes.  DVT prophylaxis: Lovenox  Code Status: Full code Family Communication: None at bedside Disposition Plan: Patient is sick with finger infection involving deep tissue and required IV antibiotics, patient had surgery consultation expect more than 2 midnight hospital stay Consults called: Hand surgery Admission status: MedSurg admission   Cort ONEIDA Mana MD [facility] Pager [phone]  05/05/2024, 8:17 AM

## 2024-05-05 NOTE — ED Notes (Signed)
 Pt refusing to go to shared inpatient room.  AC and bed placement notified.  Assigned bed to be removed.

## 2024-05-05 NOTE — Consult Note (Signed)
 ORTHOPAEDIC CONSULTATION  REQUESTING PHYSICIAN: Laurita Cort DASEN, MD  Chief Complaint:   Right index finger pain and swelling status post nail gun injury.  History of Present Illness: Marcus Huang is a 73 y.o. male with a history of coronary artery disease, carotid artery thrombosis, diabetes, hypertension, hyperlipidemia, and sleep apnea who was in his usual state of health until about 4 days ago.  While using a nail gun in his left hand and holding a board with his right, the nail gun somehow deflected, causing a penetrating injury into the radial aspect of his right index fingertip.  The patient washed it out and washed it carefully.  However, over the past 24 hours, he has noted progressive swelling, erythema, stiffness, and pain develop in the index finger, prompting him to present to the emergency room.  The patient has been admitted for IV antibiotics.  I have been consulted from an orthopedic standpoint to assess his flexor sheath.  In addition, plain radiographs suggest the presence of a possible retained foreign body in the index fingertip.  Past Medical History:  Diagnosis Date   Carotid artery thrombosis    Left Carotid Artery   Coronary artery disease    Diabetes (HCC)    Diverticulosis    History of kidney stones    Hyperlipidemia    Hypertension    Renal mass    Sleep apnea    C-PAP   Past Surgical History:  Procedure Laterality Date   COLONOSCOPY WITH PROPOFOL  N/A 08/15/2019   Procedure: COLONOSCOPY WITH PROPOFOL ;  Surgeon: Jinny Carmine, MD;  Location: [facility];  Service: Endoscopy;  Laterality: N/A;  sleep apnea   CORONARY ARTERY BYPASS GRAFT N/A 12/10/2023   Procedure: CORONARY ARTERY BYPASS GRAFTING (CABG) xTHREE USING LEFT INTERNAL MAMMARY ARTERY AND ENDOSCOPICALLY HARVESTED RIGHT GREATER SAPPHENOUS VEIN;  Surgeon: Lucas Dorise POUR, MD;  Location: [facility];  Service: Open Heart Surgery;  Laterality:  N/A;   CORONARY STENT INTERVENTION N/A 08/05/2022   Procedure: CORONARY STENT INTERVENTION;  Surgeon: Ammon Blunt, MD;  Location: [facility];  Service: Cardiovascular;  Laterality: N/A;   CORONARY STENT INTERVENTION Left 08/28/2022   Procedure: CORONARY STENT INTERVENTION;  Surgeon: Ammon Blunt, MD;  Location: [facility];  Service: Cardiovascular;  Laterality: Left;   EYE SURGERY Bilateral    LASIK EYE SURGERY   INGUINAL HERNIA REPAIR Bilateral 05/12/2017   Procedure: LAPAROSCOPIC BILATERAL INGUINAL HERNIA REPAIR, umbilical hernia repair;  Surgeon: Wonda Charlie BRAVO, MD;  Location: [facility];  Service: General;  Laterality: Bilateral;   INTRAOPERATIVE TRANSESOPHAGEAL ECHOCARDIOGRAM N/A 12/10/2023   Procedure: ECHOCARDIOGRAM, TRANSESOPHAGEAL, INTRAOPERATIVE;  Surgeon: Lucas Dorise POUR, MD;  Location: [facility];  Service: Open Heart Surgery;  Laterality: N/A;   LEFT HEART CATH AND CORONARY ANGIOGRAPHY N/A 08/05/2022   Procedure: LEFT HEART CATH AND CORONARY ANGIOGRAPHY;  Surgeon: Ammon Blunt, MD;  Location: [facility];  Service: Cardiovascular;  Laterality: N/A;   LEFT HEART CATH AND CORONARY ANGIOGRAPHY Left 11/11/2023   Procedure: LEFT HEART CATH AND CORONARY ANGIOGRAPHY;  Surgeon: Ammon Blunt, MD;  Location: [facility];  Service: Cardiovascular;  Laterality: Left;   TONSILLECTOMY     As a child   Social History   Socioeconomic History   Marital status: Married    Spouse name: Marcus   Number of children: Not on file   Years of education: Not on file   Highest education level: Not on file  Occupational History   Not on  file  Tobacco Use   Smoking status: Never   Smokeless tobacco: Never  Vaping Use   Vaping status: Never Used  Substance and Sexual Activity   Alcohol use: No   Drug use: No   Sexual activity: Yes    Birth control/protection: None  Other Topics Concern   Not on file  Social History Narrative    Lives with Marcus and no indoor pets.     Social Drivers of Corporate investment banker Strain: Low Risk  (05/17/2023)   Received from [facility]   Overall Financial Resource Strain (CARDIA)    Difficulty of Paying Living Expenses: Not very hard  Food Insecurity: No Food Insecurity (12/11/2023)   Hunger Vital Sign    Worried About Running Out of Food in the Last Year: Never true    Ran Out of Food in the Last Year: Never true  Transportation Needs: No Transportation Needs (12/11/2023)   PRAPARE - Administrator, Civil Service (Medical): No    Lack of Transportation (Non-Medical): No  Physical Activity: Not on file  Stress: Not on file  Social Connections: Socially Integrated (12/11/2023)   Social Connection and Isolation Panel    Frequency of Communication with Friends and Family: Twice a week    Frequency of Social Gatherings with Friends and Family: Twice a week    Attends Religious Services: 1 to 4 times per year    Active Member of [employer] or Organizations: Yes    Attends Engineer, structural: 1 to 4 times per year    Marital Status: Married   Family History  Problem Relation Age of Onset   Hypertension Father    Stroke Father    Coronary artery disease Maternal Grandmother    Stroke Maternal Grandmother    Diabetes Mother    Hypertension Mother    Stroke Mother    Stroke Paternal Grandmother    Prostate cancer Neg Hx    Bladder Cancer Neg Hx    Allergies  Allergen Reactions   Metformin Other (See Comments)    Stomach problems   Rosuvastatin  Other (See Comments)    fatigue   Silicone Dermatitis   Tape Rash   Prior to Admission medications   Medication Sig Start Date End Date Taking? Authorizing Provider  amLODipine  (NORVASC ) 10 MG tablet Take 1 tablet (10 mg total) by mouth daily. 12/16/23  Yes Raguel Con RAMAN, PA-C  aspirin  EC 81 MG tablet Take 81 mg by mouth daily. Swallow whole.   Yes [provider]  lisinopril   (ZESTRIL ) 5 MG tablet Take 1 tablet (5 mg total) by mouth daily. 12/16/23  Yes Raguel Con RAMAN, PA-C  metoprolol  tartrate (LOPRESSOR ) 50 MG tablet Take 1 tablet (50 mg total) by mouth 2 (two) times daily. Patient taking differently: Take 25 mg by mouth 2 (two) times daily. Take half in the morning and half in the evening 12/16/23  Yes Raguel, Con RAMAN, PA-C  acetaminophen  (TYLENOL ) 325 MG tablet Take 2 tablets (650 mg total) by mouth every 6 (six) hours as needed for mild pain (pain score 1-3). Patient not taking: Reported on 05/05/2024 12/16/23   Raguel Con RAMAN, PA-C  amiodarone  (PACERONE ) 200 MG tablet Take 2 tablets by mouth twice per day for 7 days, then take 1 tablet twice per day for 7 days, then take 1 tablet daily thereafter Patient not taking: Reported on 05/05/2024 12/16/23   Raguel Con RAMAN, PA-C  amiodarone  (PACERONE ) 200 MG  tablet 200 mg. 12/16/23   [provider]  amLODipine  (NORVASC ) 10 MG tablet Take 1 tablet by mouth daily. 12/16/23   [provider]  amoxicillin -clavulanate (AUGMENTIN ) 875-125 MG tablet Take 1 tablet by mouth 2 (two) times daily. Patient not taking: Reported on 05/05/2024 03/23/24   [provider]  aspirin  EC 325 MG tablet Take 1 tablet (325 mg total) by mouth daily. Patient not taking: Reported on 01/13/2024 12/16/23   Raguel Con GORMAN DEVONNA  aspirin  EC 81 MG tablet Take 81 mg by mouth. 12/28/23 12/27/24  [provider]  Continuous Glucose Sensor (FREESTYLE LIBRE 3 PLUS SENSOR) MISC FOLLOW PACKAGE DIRECTIONS CHANGE EVERY 15 DAYS 12/25/23   [provider]  metoprolol  tartrate (LOPRESSOR ) 50 MG tablet Take 1 tablet by mouth 2 (two) times daily. Patient not taking: Reported on 01/18/2024 12/16/23   [provider]  Omega-3 Fatty Acids (OMEGA 3 500 PO) Take 500 mg by mouth daily. Patient not taking: Reported on 05/05/2024    [provider]  [facility] ULTRA TEST test strip 1 each 3 (three) times daily.    [provider]  oxyCODONE  (OXY IR/ROXICODONE ) 5 MG immediate release tablet Take 1 tablet (5 mg total) by mouth every 6 (six) hours as needed for severe pain (pain score 7-10). Patient not taking: Reported on 05/05/2024 12/22/23   Dwan Kyla HERO, PA-C  PREVIDENT 5000 SENSITIVE 1.1-5 % GEL Place 1 Application onto teeth 2 (two) times daily. 07/03/22   [provider]  REPATHA SURECLICK 140 MG/ML SOAJ Inject 1 mL into the skin every 14 (fourteen) days.    [provider]   DG Finger Index Right Result Date: 05/05/2024 CLINICAL DATA:  Punctured index finger with a nail gun on Saturday now with swelling pain and redness EXAM: RIGHT INDEX FINGER 2+V COMPARISON:  None Available. FINDINGS: Swelling about the right second finger. No acute fracture or dislocation. Tiny linear foreign body in the palmar soft tissues of the distal second finger no evidence of osteomyelitis. IMPRESSION: 1. Swelling about the right second finger. Tiny linear foreign body in the palmar soft tissues of the distal second finger. 2. No acute fracture or dislocation. Electronically Signed   By: Norman Gatlin M.D.   On: 05/05/2024 01:58    Positive ROS: All other systems have been reviewed and were otherwise negative with the exception of those mentioned in the HPI and as above.  Physical Exam: General:  Alert, no acute distress Psychiatric:  Patient is competent for consent with normal mood and affect   Cardiovascular:  No pedal edema Respiratory:  No wheezing, non-labored breathing GI:  Abdomen is soft and non-tender Skin:  No lesions in the area of chief complaint Neurologic:  Sensation intact distally Lymphatic:  No axillary or cervical lymphadenopathy  Orthopedic Exam:  Orthopedic examination is limited to the right hand.  There is a small punctate wound over the radial aspect of the index finger just distal to the DIP joint.  This wound has no drainage or bleeding from it.  There is moderate  swelling and mild erythema along the length of the index finger extending into the volar aspect of the hand over the distal index metacarpal region.  He has mild-moderate tenderness to palpation along the volar aspect of the index finger.  There are no other areas of tenderness in the hand.  He is able to actively flex and extend the PIP and DIP joint of the index finger, although motion is limited  due to pain and and swelling.  He is grossly neurovascularly intact to all digits.  X-rays:  Recent x-rays of the right index finger are available for review and have been reviewed by myself.  The findings are as described above.    Assessment: Right index finger infection with possible early suppurative flexor tenosynovitis.  Plan: The treatment options, including both surgical and nonsurgical choices, have been discussed in detail with the patient.  The patient would like to proceed with formal irrigation debridement of the right index finger wound, as well as an exploration of possible irrigation and debridement of the right index flexor tendon sheath.  The risks (including bleeding, infection, nerve and/or blood vessel injury, persistent or recurrent pain, stiffness of the finger, weakness of grip, need for further surgery, blood clots, strokes, heart attacks or arrhythmias, pneumonia, etc.) and benefits of the surgical procedure were discussed. The patient states his understanding and agrees to proceed.  A formal written consent will be obtained by the nursing staff.  Thank you for asking me to participate in the care of this most pleasant unfortunate man.  I will be happy to follow him with you.   DOROTHA Reyes Maltos, MD  Beeper #:  [id number]  05/05/2024 11:19 AM

## 2024-05-05 NOTE — Anesthesia Procedure Notes (Signed)
 Procedure Name: Intubation Date/Time: 05/05/2024 1:45 PM  Performed by: Bonnetta Jimmey SAUNDERS, CRNAPre-anesthesia Checklist: Patient identified, Emergency Drugs available, Suction available and Patient being monitored Patient Re-evaluated:Patient Re-evaluated prior to induction Oxygen Delivery Method: Circle system utilized Preoxygenation: Pre-oxygenation with 100% oxygen Induction Type: IV induction Ventilation: Mask ventilation without difficulty Laryngoscope Size: McGrath and 4 Grade View: Grade I Tube type: Oral Tube size: 7.0 mm Number of attempts: 1 Airway Equipment and Method: Stylet and Oral airway Placement Confirmation: ETT inserted through vocal cords under direct vision, positive ETCO2 and breath sounds checked- equal and bilateral Secured at: 22 cm Tube secured with: Tape Dental Injury: Teeth and Oropharynx as per pre-operative assessment

## 2024-05-05 NOTE — Progress Notes (Signed)
 ED Pharmacy Antibiotic Sign Off An antibiotic consult was received from an ED provider for Vancomycin  per pharmacy dosing for cellulitis. A chart review was completed to assess appropriateness.   The following one time order(s) were placed:  Vancomycin  1750 mg per pt wt: 76.2 kg  Further antibiotic and/or antibiotic pharmacy consults should be ordered by the admitting provider if indicated.   Thank you for allowing pharmacy to be a part of this patient's care.   Thank you, Rankin CANDIE Dills, PharmD, [facility] 05/05/2024 4:36 AM

## 2024-05-06 ENCOUNTER — Encounter: Payer: Self-pay | Admitting: Surgery

## 2024-05-06 DIAGNOSIS — L03011 Cellulitis of right finger: Secondary | ICD-10-CM | POA: Diagnosis not present

## 2024-05-06 LAB — CBC
HCT: 38.5 % — ABNORMAL LOW (ref 39.0–52.0)
Hemoglobin: 13.5 g/dL (ref 13.0–17.0)
MCH: 30.5 pg (ref 26.0–34.0)
MCHC: 35.1 g/dL (ref 30.0–36.0)
MCV: 87.1 fL (ref 80.0–100.0)
Platelets: 188 K/uL (ref 150–400)
RBC: 4.42 MIL/uL (ref 4.22–5.81)
RDW: 14.4 % (ref 11.5–15.5)
WBC: 6.4 K/uL (ref 4.0–10.5)
nRBC: 0 % (ref 0.0–0.2)

## 2024-05-06 LAB — BASIC METABOLIC PANEL WITH GFR
Anion gap: 8 (ref 5–15)
BUN: 19 mg/dL (ref 8–23)
CO2: 24 mmol/L (ref 22–32)
Calcium: 8.4 mg/dL — ABNORMAL LOW (ref 8.9–10.3)
Chloride: 107 mmol/L (ref 98–111)
Creatinine, Ser: 0.92 mg/dL (ref 0.61–1.24)
GFR, Estimated: >60 mL/min (ref >60)
Glucose, Bld: 102 mg/dL — ABNORMAL HIGH (ref 70–99)
Potassium: 3.7 mmol/L (ref 3.5–5.1)
Sodium: 139 mmol/L (ref 135–145)

## 2024-05-06 LAB — GLUCOSE, CAPILLARY
Glucose-Capillary: 99 mg/dL (ref 70–99)
Glucose-Capillary: 143 mg/dL — ABNORMAL HIGH (ref 70–99)

## 2024-05-06 MED ORDER — AMOXICILLIN-POT CLAVULANATE 875-125 MG PO TABS: 1.0000 | ORAL_TABLET | Freq: Two times a day (BID) | ORAL | 0 refills | Status: AC

## 2024-05-06 MED ORDER — ORAL CARE MOUTH RINSE: 15.0000 mL | OROMUCOSAL | Status: DC | PRN

## 2024-05-06 MED ORDER — OXYCODONE HCL 5 MG PO TABS: 5.0000 mg | ORAL_TABLET | Freq: Four times a day (QID) | ORAL | 0 refills | Status: AC | PRN

## 2024-05-06 NOTE — Plan of Care (Signed)
  Problem: Education: Goal: Ability to describe self-care measures that may prevent or decrease complications (Diabetes Survival Skills Education) will improve Outcome: Progressing Goal: Individualized Educational Video(s) Outcome: Progressing   Problem: Coping: Goal: Ability to adjust to condition or change in health will improve Outcome: Progressing   Problem: Fluid Volume: Goal: Ability to maintain a balanced intake and output will improve Outcome: Progressing   Problem: Health Behavior/Discharge Planning: Goal: Ability to identify and utilize available resources and services will improve Outcome: Progressing Goal: Ability to manage health-related needs will improve Outcome: Progressing   Problem: Nutritional: Goal: Maintenance of adequate nutrition will improve Outcome: Progressing Goal: Progress toward achieving an optimal weight will improve Outcome: Progressing   Problem: Skin Integrity: Goal: Risk for impaired skin integrity will decrease Outcome: Progressing   Problem: Tissue Perfusion: Goal: Adequacy of tissue perfusion will improve Outcome: Progressing   Problem: Education: Goal: Knowledge of General Education information will improve Description: Including pain rating scale, medication(s)/side effects and non-pharmacologic comfort measures Outcome: Progressing   Problem: Health Behavior/Discharge Planning: Goal: Ability to manage health-related needs will improve Outcome: Progressing   Problem: Activity: Goal: Risk for activity intolerance will decrease Outcome: Progressing   Problem: Nutrition: Goal: Adequate nutrition will be maintained Outcome: Progressing   Problem: Coping: Goal: Level of anxiety will decrease Outcome: Progressing   Problem: Pain Managment: Goal: General experience of comfort will improve and/or be controlled Outcome: Progressing   Problem: Safety: Goal: Ability to remain free from injury will improve Outcome: Progressing    Problem: Skin Integrity: Goal: Risk for impaired skin integrity will decrease Outcome: Progressing   Problem: Education: Goal: Knowledge of General Education information will improve Description: Including pain rating scale, medication(s)/side effects and non-pharmacologic comfort measures Outcome: Progressing

## 2024-05-06 NOTE — Care Management CC44 (Cosign Needed)
 Condition Code 44 Documentation Completed  Patient Details  Name: Marcus Huang MRN: [id number] Date of Birth: 1950/11/08   Condition Code 44 given:    Patient signature on Condition Code 44 notice:    Documentation of 2 MD's agreement:    Code 44 added to claim:       Dalia GORMAN Fuse, RN 05/06/2024, 2:25 PM

## 2024-05-06 NOTE — TOC Transition Note (Signed)
 Transition of Care [facility]) - Discharge Note   Patient Details  Name: Marcus Huang MRN: [id number] Date of Birth: 1951/07/01  Transition of Care [facility]) CM/SW Contact:  Dalia GORMAN Fuse, RN Phone Number: 05/06/2024, 2:42 PM   Clinical Narrative:    Patient is medically clear to discharge to home. No TOC needs identified.    Final next level of care: Home/Self Care Barriers to Discharge: Barriers Resolved   Patient Goals and CMS Choice            Discharge Placement                       Discharge Plan and Services Additional resources added to the After Visit Summary for                                       Social Drivers of Health (SDOH) Interventions SDOH Screenings   Food Insecurity: No Food Insecurity (05/05/2024)  Housing: Low Risk  (05/05/2024)  Transportation Needs: No Transportation Needs (05/05/2024)  Utilities: Not At Risk (05/05/2024)  Depression (PHQ2-9): Medium Risk (02/23/2024)  Financial Resource Strain: Low Risk  (05/17/2023)   Received from [facility]  Social Connections: Unknown (05/05/2024)  Tobacco Use: Low Risk  (05/05/2024)     Readmission Risk Interventions    12/15/2023    3:25 PM  Readmission Risk Prevention Plan  Post Dischage Appt Complete  Medication Screening Complete  Transportation Screening Complete

## 2024-05-06 NOTE — Progress Notes (Signed)
 PT Cancellation Note  Patient Details Name: Marcus Huang MRN: [id number] DOB: 1951-02-17   Cancelled Treatment:    Reason Eval/Treat Not Completed: PT screened, no needs identified, will sign off (Consult received and chart reviewed.  Per patient (confirmed by primary RN), patient indep in room and around unit without assist device.  Appears at baseline level of mobility; no acute PT needs identified. Will complete initial order; please reconsult should needs change.)  Rhyland Hinderliter H. Delores, PT, DPT, NCS 05/06/24, 9:47 AM [phone]

## 2024-05-06 NOTE — Discharge Summary (Addendum)
 Physician Discharge Summary  JAVARI BUFKIN FMW:[id number] DOB: [birth year] DOA: 05/05/2024  PCP: Jeffie Cheryl BRAVO, MD  Admit date: 05/05/2024 Discharge date: 05/06/2024  Admitted From: home  Disposition: home   Recommendations for Outpatient Follow-up:  Follow up with PCP in 1-2 weeks F/u w/ ortho surg, PA McGhee, in 4-5 days  Home Health: no  Equipment/Devices:  Discharge Condition: stable CODE STATUS: full Diet recommendation: Heart Healthy / Carb Modified   Brief/Interim Summary: HPI was taken from Dr. Laurita: Marcus Huang is a 73 y.o. male with medical history significant of HTN, HLD, CAD, IIDM, presented with worsening of right index finger swelling pain.   Symptoms started 4 days ago, patient sustained a accidental nail gun shot while at work.  Penetrate through distal right index finger, and next day, she started to develop pain and swelling of the distal part right index finger 2 days despite hand pain moved to the whole right index finger.  Denied any fever or chills.  He feels heaviness in the finger and hard to bend but denied any numbness of the finger.   ED Course: Afebrile blood pressure 180/110 O2 saturation 100% on room air.  X-ray showed foreign body in the distal part of right index finger, WBC 6.0 13.6 glucose 95.   Patient was given vancomycin  and ceftriaxone   Discharge Diagnoses:  Principal Problem:   Cellulitis of finger of right hand Active Problems:   Cellulitis  Right index finger cellulitis: likely secondary to tenosynovitis & possible foreign body. S/p irrigation & debridement as per ortho surg 05/05/24. Continue on IV rocephin  while inpatient and d/c home on po augmentin  to complete the course. Wound cx NGTD   HTN: continue on home dose of metoprolol , amlodipine , lisinopril    Hx of CAD: s/p CABG & stents. Continue on home dose of aspirin  and takes repatha q2wks.   Hx of ventricular arrhythmia: continue on home dose of amio  DM2: well controlled,  HbA1c 5.6. Diet controlled   Discharge Instructions  Discharge Instructions     Diet - low sodium heart healthy   Complete by: As directed    Diet Carb Modified   Complete by: As directed    Discharge instructions   Complete by: As directed    F/u w/ ortho surg, PA McGhee, in 4-5 days. F/u w/ PCP in 1-2 weeks   Increase activity slowly   Complete by: As directed    No wound care   Complete by: As directed       Allergies as of 05/06/2024       Reactions   Metformin Other (See Comments)   Stomach problems   Rosuvastatin  Other (See Comments)   fatigue   Silicone Dermatitis   Tape Rash        Medication List     TAKE these medications    acetaminophen  325 MG tablet Commonly known as: Tylenol  Take 2 tablets (650 mg total) by mouth every 6 (six) hours as needed for mild pain (pain score 1-3).   amiodarone  200 MG tablet Commonly known as: PACERONE  200 mg. What changed: Another medication with the same name was removed. Continue taking this medication, and follow the directions you see here.   amLODipine  10 MG tablet Commonly known as: NORVASC  Take 1 tablet by mouth daily. What changed: Another medication with the same name was removed. Continue taking this medication, and follow the directions you see here.   amoxicillin -clavulanate 875-125 MG tablet Commonly known as: AUGMENTIN  Take 1 tablet  by mouth 2 (two) times daily for 7 days.   aspirin  EC 81 MG tablet Take 81 mg by mouth daily. Swallow whole. What changed: Another medication with the same name was removed. Continue taking this medication, and follow the directions you see here.   chlorhexidine  4 % external liquid Commonly known as: HIBICLENS  Apply 15 mLs (1 Application total) topically as directed for 30 doses. Use as directed daily for 5 days every other week for 6 weeks.   FreeStyle Libre 3 Plus Sensor Misc FOLLOW PACKAGE DIRECTIONS CHANGE EVERY 15 DAYS   lisinopril  5 MG tablet Commonly known as:  ZESTRIL  Take 1 tablet (5 mg total) by mouth daily.   metoprolol  tartrate 50 MG tablet Commonly known as: LOPRESSOR  Take 1 tablet (50 mg total) by mouth 2 (two) times daily. What changed:  how much to take additional instructions Another medication with the same name was removed. Continue taking this medication, and follow the directions you see here.   mupirocin  ointment 2 % Commonly known as: BACTROBAN  Place 1 Application into the nose 2 (two) times daily for 60 doses. Use as directed 2 times daily for 5 days every other week for 6 weeks.   OMEGA 3 500 PO Take 500 mg by mouth daily.   OneTouch Ultra Test test strip Generic drug: glucose blood 1 each 3 (three) times daily.   oxyCODONE  5 MG immediate release tablet Commonly known as: Oxy IR/ROXICODONE  Take 1 tablet (5 mg total) by mouth every 6 (six) hours as needed for up to 3 days for severe pain (pain score 7-10) or moderate pain (pain score 4-6). What changed: reasons to take this   PreviDent 5000 Sensitive 1.1-5 % Gel Generic drug: Sod Fluoride-Potassium Nitrate Place 1 Application onto teeth 2 (two) times daily.   Repatha SureClick 140 MG/ML Soaj Generic drug: Evolocumab Inject 1 mL into the skin every 14 (fourteen) days.        Follow-up Information     Kip Lynwood Double, PA-C. Call.   Specialty: Physician Assistant Why: Call for an appointment on Tue/Wed of next week. Contact information: [address] [place] [address] [phone]                Allergies  Allergen Reactions   Metformin Other (See Comments)    Stomach problems   Rosuvastatin  Other (See Comments)    fatigue   Silicone Dermatitis   Tape Rash    Consultations: Ortho surg    Procedures/Studies: DG Finger Index Right Result Date: 05/05/2024 CLINICAL DATA:  Punctured index finger with a nail gun on Saturday now with swelling pain and redness EXAM: RIGHT INDEX FINGER 2+V COMPARISON:  None Available. FINDINGS:  Swelling about the right second finger. No acute fracture or dislocation. Tiny linear foreign body in the palmar soft tissues of the distal second finger no evidence of osteomyelitis. IMPRESSION: 1. Swelling about the right second finger. Tiny linear foreign body in the palmar soft tissues of the distal second finger. 2. No acute fracture or dislocation. Electronically Signed   By: Norman Gatlin M.D.   On: 05/05/2024 01:58   (Echo, Carotid, EGD, Colonoscopy, ERCP)    Subjective: Pt c/o intermittent finger pain    Discharge Exam: Vitals:   05/06/24 0607 05/06/24 0831  BP: (!) 142/78 (!) 165/81  Pulse:  82  Resp:  16  Temp:  98.8 F (37.1 C)  SpO2:  99%   Vitals:   05/06/24 9545 05/06/24 0507 05/06/24 9392 05/06/24 0831  BP: (!) 189/87 (!) 164/90 (!) 142/78 (!) 165/81  Pulse:    82  Resp:    16  Temp:    98.8 F (37.1 C)  TempSrc:      SpO2:    99%  Weight:      Height:        General: Pt is alert, awake, not in acute distress Cardiovascular: S1/S2 +, no rubs, no gallops Respiratory: CTA bilaterally, no wheezing, no rhonchi Abdominal: Soft, NT, ND, bowel sounds + Extremities: no edema, no cyanosis. R index finger is dressed & dressing is C/D/I     The results of significant diagnostics from this hospitalization (including imaging, microbiology, ancillary and laboratory) are listed below for reference.     Microbiology: Recent Results (from the past 240 hours)  Blood culture (routine x 2)     Status: None (Preliminary result)   Collection Time: 05/05/24  1:35 AM   Specimen: BLOOD  Result Value Ref Range Status   Specimen Description BLOOD RIGHT ASSIST CONTROL  Final   Special Requests   Final    BOTTLES DRAWN AEROBIC AND ANAEROBIC Blood Culture adequate volume   Culture   Final    NO GROWTH 1 DAY Performed at [facility], [address], [place], [place] [address]    Report Status PENDING  Incomplete  Blood culture (routine x 2)     Status: None  (Preliminary result)   Collection Time: 05/05/24  4:58 AM   Specimen: BLOOD  Result Value Ref Range Status   Specimen Description BLOOD RIGHT FOREARM  Final   Special Requests   Final    BOTTLES DRAWN AEROBIC AND ANAEROBIC Blood Culture results may not be optimal due to an inadequate volume of blood received in culture bottles   Culture   Final    NO GROWTH < 24 HOURS Performed at [facility], [address] [place], [place] [address]    Report Status PENDING  Incomplete  Aerobic/Anaerobic Culture w Gram Stain (surgical/deep wound)     Status: None (Preliminary result)   Collection Time: 05/05/24  1:59 PM   Specimen: Path Tissue  Result Value Ref Range Status   Specimen Description   Final    WOUND Performed at [facility], [address], [place], [place] [address]    Special Requests   Final    RIGHT HAND CELLULITIS Performed at [facility], [address] [place], [place] [address]    Gram Stain NO WBC SEEN NO ORGANISMS SEEN   Final   Culture   Final    CULTURE REINCUBATED FOR BETTER GROWTH Performed at [facility], [address], [place], [place] [address]    Report Status PENDING  Incomplete  Aerobic/Anaerobic Culture w Gram Stain (surgical/deep wound)     Status: None (Preliminary result)   Collection Time: 05/05/24  2:06 PM   Specimen: Path Tissue  Result Value Ref Range Status   Specimen Description   Final    WOUND Performed at [facility], [address] [place], [place] [address]    Special Requests   Final    RIGHT HAND CELLULITIS Performed at [facility], [address] [place], [place] [address]    Gram Stain NO WBC SEEN NO ORGANISMS SEEN   Final   Culture   Final    NO GROWTH < 24 HOURS Performed at [facility], [address], [place], [place] [address]  Report Status PENDING  Incomplete  Aerobic/Anaerobic Culture w Gram Stain (surgical/deep wound)     Status: None  (Preliminary result)   Collection Time: 05/05/24  2:08 PM   Specimen: PATH Soft tissue  Result Value Ref Range Status   Specimen Description   Final    WOUND Performed at [facility], [address] [place], [place] [address]    Special Requests   Final    RIGHT HAND CELLULITIS Performed at [facility], [address] [place], [place] [address]    Gram Stain   Final    RARE WBC PRESENT, PREDOMINANTLY PMN NO ORGANISMS SEEN    Culture   Final    NO GROWTH < 24 HOURS Performed at [facility], [address], [place], [place] [address]    Report Status PENDING  Incomplete     Labs: BNP (last 3 results) No results for input(s): BNP in the last 8760 hours. Basic Metabolic Panel: Recent Labs  Lab 05/05/24 0135 05/06/24 0554  NA 139 139  K 3.8 3.7  CL 106 107  CO2 23 24  GLUCOSE 95 102*  BUN 35* 19  CREATININE 1.03 0.92  CALCIUM  8.8* 8.4*   Liver Function Tests: Recent Labs  Lab 05/05/24 0135  AST 16  ALT 15  ALKPHOS 67  BILITOT 0.3  PROT 7.3  ALBUMIN  4.3   No results for input(s): LIPASE, AMYLASE in the last 168 hours. No results for input(s): AMMONIA in the last 168 hours. CBC: Recent Labs  Lab 05/05/24 0135 05/06/24 0554  WBC 6.0 6.4  NEUTROABS 3.6  --   HGB 13.6 13.5  HCT 40.6 38.5*  MCV 88.5 87.1  PLT 195 188   Cardiac Enzymes: No results for input(s): CKTOTAL, CKMB, CKMBINDEX, TROPONINI in the last 168 hours. BNP: Invalid input(s): POCBNP CBG: Recent Labs  Lab 05/05/24 1451 05/05/24 2033 05/06/24 0803 05/06/24 1217  GLUCAP 91 131* 99 143*   D-Dimer No results for input(s): DDIMER in the last 72 hours. Hgb A1c No results for input(s): HGBA1C in the last 72 hours. Lipid Profile No results for input(s): CHOL, HDL, LDLCALC, TRIG, CHOLHDL, LDLDIRECT in the last 72 hours. Thyroid  function studies No results for input(s): TSH, T4TOTAL, T3FREE, THYROIDAB in the last 72  hours.  Invalid input(s): FREET3 Anemia work up No results for input(s): VITAMINB12, FOLATE, FERRITIN, TIBC, IRON, RETICCTPCT in the last 72 hours. Urinalysis    Component Value Date/Time   COLORURINE YELLOW 12/08/2023 1355   APPEARANCEUR CLEAR 12/08/2023 1355   APPEARANCEUR Clear 03/25/2017 1515   LABSPEC 1.019 12/08/2023 1355   PHURINE 6.0 12/08/2023 1355   GLUCOSEU NEGATIVE 12/08/2023 1355   HGBUR NEGATIVE 12/08/2023 1355   BILIRUBINUR NEGATIVE 12/08/2023 1355   BILIRUBINUR Negative 03/25/2017 1515   KETONESUR 5 (A) 12/08/2023 1355   PROTEINUR NEGATIVE 12/08/2023 1355   NITRITE NEGATIVE 12/08/2023 1355   LEUKOCYTESUR NEGATIVE 12/08/2023 1355   Sepsis Labs Recent Labs  Lab 05/05/24 0135 05/06/24 0554  WBC 6.0 6.4   Microbiology Recent Results (from the past 240 hours)  Blood culture (routine x 2)     Status: None (Preliminary result)   Collection Time: 05/05/24  1:35 AM   Specimen: BLOOD  Result Value Ref Range Status   Specimen Description BLOOD RIGHT ASSIST CONTROL  Final   Special Requests   Final    BOTTLES DRAWN AEROBIC AND ANAEROBIC Blood Culture adequate volume   Culture   Final    NO GROWTH  1 DAY Performed at [facility], [address] [place], [place] [address]    Report Status PENDING  Incomplete  Blood culture (routine x 2)     Status: None (Preliminary result)   Collection Time: 05/05/24  4:58 AM   Specimen: BLOOD  Result Value Ref Range Status   Specimen Description BLOOD RIGHT FOREARM  Final   Special Requests   Final    BOTTLES DRAWN AEROBIC AND ANAEROBIC Blood Culture results may not be optimal due to an inadequate volume of blood received in culture bottles   Culture   Final    NO GROWTH < 24 HOURS Performed at [facility], [address] [place], [place] [address]    Report Status PENDING  Incomplete  Aerobic/Anaerobic Culture w Gram Stain (surgical/deep wound)     Status: None (Preliminary result)    Collection Time: 05/05/24  1:59 PM   Specimen: Path Tissue  Result Value Ref Range Status   Specimen Description   Final    WOUND Performed at [facility], [address] [place], [place] [address]    Special Requests   Final    RIGHT HAND CELLULITIS Performed at [facility], [address] [place], [place] [address]    Gram Stain NO WBC SEEN NO ORGANISMS SEEN   Final   Culture   Final    CULTURE REINCUBATED FOR BETTER GROWTH Performed at [facility], [address], [place], [place] [address]    Report Status PENDING  Incomplete  Aerobic/Anaerobic Culture w Gram Stain (surgical/deep wound)     Status: None (Preliminary result)   Collection Time: 05/05/24  2:06 PM   Specimen: Path Tissue  Result Value Ref Range Status   Specimen Description   Final    WOUND Performed at [facility], [address] [place], [place] [address]    Special Requests   Final    RIGHT HAND CELLULITIS Performed at [facility], [address] [place], [place] [address]    Gram Stain NO WBC SEEN NO ORGANISMS SEEN   Final   Culture   Final    NO GROWTH < 24 HOURS Performed at [facility], [address] [place], [place] [address]    Report Status PENDING  Incomplete  Aerobic/Anaerobic Culture w Gram Stain (surgical/deep wound)     Status: None (Preliminary result)   Collection Time: 05/05/24  2:08 PM   Specimen: PATH Soft tissue  Result Value Ref Range Status   Specimen Description   Final    WOUND Performed at [facility], [address] [place], [place] [address]    Special Requests   Final    RIGHT HAND CELLULITIS Performed at [facility], [address] [place], [place] [address]    Gram Stain   Final    RARE WBC PRESENT, PREDOMINANTLY PMN NO ORGANISMS SEEN    Culture   Final    NO GROWTH < 24 HOURS Performed at [facility], [address] [place], [place] [address]    Report Status  PENDING  Incomplete     Time coordinating discharge: 32 minutes  SIGNED:   Anthony CHRISTELLA Pouch, MD  [facility] 05/06/2024, 1:48 PM Pager   If 7PM-7AM, please contact night-coverage [email or web address]

## 2024-05-06 NOTE — TOC Initial Note (Signed)
 Transition of Care [facility]) - Initial/Assessment Note    Patient Details  Name: Marcus Huang MRN: [id number] Date of Birth: 08-25-1951  Transition of Care [facility]) CM/SW Contact:    Dalia GORMAN Fuse, RN Phone Number: 05/06/2024, 11:52 AM  Clinical Narrative:                 Patient is from home and presented with right index finger cellulitis with likely tenosynovitis development of right finger. TOC will continue to follow.   Expected Discharge Plan: Home/Self Care Barriers to Discharge: Continued Medical Work up   Patient Goals and CMS Choice            Expected Discharge Plan and Services       Living arrangements for the past 2 months: Single Family Home                                      Prior Living Arrangements/Services Living arrangements for the past 2 months: Single Family Home Lives with:: Self                   Activities of Daily Living   ADL Screening (condition at time of admission) Independently performs ADLs?: Yes (appropriate for developmental age) Is the patient deaf or have difficulty hearing?: No Does the patient have difficulty seeing, even when wearing glasses/contacts?: No Does the patient have difficulty concentrating, remembering, or making decisions?: No  Permission Sought/Granted                  Emotional Assessment              Admission diagnosis:  Cellulitis [L03.90] Cellulitis of right hand [L03.113] Cellulitis of finger of right hand [L03.011] Patient Active Problem List   Diagnosis Date Noted   Cellulitis of finger of right hand 05/05/2024   Cellulitis 05/05/2024   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/12/2022   Lymphedema 08/24/2022   Raynaud's disease 08/24/2022   PAD (peripheral artery disease) (HCC) 08/24/2022   S/P drug eluting coronary stent placement 08/15/2022   CAD (coronary artery disease) 08/05/2022   Screen  for colon cancer 03/14/2020   Special screening for malignant neoplasms, colon    Peyronie's disease 07/16/2019   Renal mass 07/10/2019   Heart palpitations 06/27/2019   Carotid artery stenosis 06/02/2019   Umbilical hernia without obstruction and without gangrene 06/02/2019   Aortic atherosclerosis (HCC) 06/02/2019   Hypertensive urgency 05/31/2019   Non-recurrent bilateral inguinal hernia without obstruction or gangrene    Diabetes type 2, uncontrolled 02/14/2014   Hyperlipidemia, unspecified 02/14/2014   Malignant hypertension 02/14/2014   Type 2 diabetes mellitus without complication, without long-term current use of insulin  (HCC) 02/14/2014   Hyperlipidemia 02/14/2014   Hypertension, essential, benign 02/14/2014   PCP:  Jeffie Cheryl BRAVO, MD Pharmacy:   [facility] [phone], [place] - [address] AT [facility] OF [address] [place] [address] [place] [address] Phone: [phone] Fax: [phone]     Social Drivers of Health (SDOH) Social History: SDOH Screenings   Food Insecurity: No Food Insecurity (05/05/2024)  Housing: Low Risk  (05/05/2024)  Transportation Needs: No Transportation Needs (05/05/2024)  Utilities: Not At Risk (05/05/2024)  Depression (PHQ2-9): Medium Risk (02/23/2024)  Financial Resource Strain: Low Risk  (05/17/2023)   Received  from [facility]  Social Connections: Unknown (05/05/2024)  Tobacco Use: Low Risk  (05/05/2024)   SDOH Interventions:     Readmission Risk Interventions    12/15/2023    3:25 PM  Readmission Risk Prevention Plan  Post Dischage Appt Complete  Medication Screening Complete  Transportation Screening Complete

## 2024-05-06 NOTE — Progress Notes (Addendum)
 Subjective: 1 Day Post-Op Procedure(s) (LRB): IRRIGATION AND DEBRIDEMENT WOUND RIGHT INDEX FINGER, EXPLORATION WITH IRRIGATION AND DEBRIDEMENT OF FLEXOR SHEATH WITH RELEASE OF A-1 PULLEY (Right) Patient reports pain as mild in the right hand this AM.  Feels that the finger is much improved compared to prior to surgery.   Patient is well, and has had no acute complaints or problems Plan is to go Home after hospital stay. Negative for chest pain and shortness of breath Fever: no Gastrointestinal:Negative for nausea and vomiting  Objective: Vital signs in last 24 hours: Temp:  [97 F (36.1 C)-98.5 F (36.9 C)] 97.7 F (36.5 C) (08/22 0453) Pulse Rate:  [58-79] 79 (08/22 0453) Resp:  [10-20] 19 (08/22 0453) BP: (131-189)/(67-90) 142/78 (08/22 0607) SpO2:  [96 %-100 %] 97 % (08/22 0453) Weight:  [82.4 kg] 82.4 kg (08/21 1708)  Intake/Output from previous day:  Intake/Output Summary (Last 24 hours) at 05/06/2024 0811 Last data filed at 05/05/2024 1830 Gross per 24 hour  Intake 850 ml  Output 101 ml  Net 749 ml    Intake/Output this shift: No intake/output data recorded.  Labs: Recent Labs    05/05/24 0135 05/06/24 0554  HGB 13.6 13.5   Recent Labs    05/05/24 0135 05/06/24 0554  WBC 6.0 6.4  RBC 4.59 4.42  HCT 40.6 38.5*  PLT 195 188   Recent Labs    05/05/24 0135 05/06/24 0554  NA 139 139  K 3.8 3.7  CL 106 107  CO2 23 24  BUN 35* 19  CREATININE 1.03 0.92  GLUCOSE 95 102*  CALCIUM  8.8* 8.4*   No results for input(s): LABPT, INR in the last 72 hours.   EXAM General - Patient is Alert, Appropriate, and Oriented Extremity - Bulky dressing intact to the right hand. Exposed finger without any purulent drainage.  No drainage noted into the dressing. Intact to light touch to the exposed fingertip. Able to gently flex and extend in the dressing.  Past Medical History:  Diagnosis Date   Carotid artery thrombosis    Left Carotid Artery   Coronary  artery disease    Diabetes (HCC)    Diverticulosis    History of kidney stones    Hyperlipidemia    Hypertension    Renal mass    Sleep apnea    C-PAP    Assessment/Plan: 1 Day Post-Op Procedure(s) (LRB): IRRIGATION AND DEBRIDEMENT WOUND RIGHT INDEX FINGER, EXPLORATION WITH IRRIGATION AND DEBRIDEMENT OF FLEXOR SHEATH WITH RELEASE OF A-1 PULLEY (Right) Principal Problem:   Cellulitis of finger of right hand Active Problems:   Cellulitis  Estimated body mass index is 27.62 kg/m as calculated from the following:   Height as of this encounter: 5' 8 (1.727 m).   Weight as of this encounter: 82.4 kg. Advance diet D/C IV fluids when tolerating po intake.  Labs and vitals reviewed.  WBC 6.4, no recent fevers. Cultures from surgery are pending. Drain in place, will remove at lunch today. If doing well can discharge home on oral Abx, continue to monitor culture and follow-up next week with ortho for a skin check of the finger.  DVT Prophylaxis - TED hose Weight-Bearing as tolerated to right hand.  12:12PM. Dressing was removed.  Swelling noted to the finger, but no severe erythema is noted.  Drain was removed without issue.  No purulence was seen.  New bulky dressing was applied.  Can d/c home on oral Abx with close follow-up of cultures.  Will see in  clinic next week for a skin check.       Marcus Huang Gustavo Level, PA-C [facility]opa[facility] Surgery 05/06/2024, 8:11 AM

## 2024-05-10 ENCOUNTER — Encounter

## 2024-05-10 DIAGNOSIS — R002 Palpitations: Secondary | ICD-10-CM | POA: Diagnosis not present

## 2024-05-10 LAB — CULTURE, BLOOD (ROUTINE X 2)
Culture: NO GROWTH
Culture: NO GROWTH
Special Requests: ADEQUATE

## 2024-05-10 LAB — AEROBIC/ANAEROBIC CULTURE W GRAM STAIN (SURGICAL/DEEP WOUND)
Culture: NO GROWTH
Culture: NO GROWTH
Gram Stain: NONE SEEN
Gram Stain: NONE SEEN

## 2024-05-11 DIAGNOSIS — L03113 Cellulitis of right upper limb: Secondary | ICD-10-CM | POA: Diagnosis not present

## 2024-05-11 DIAGNOSIS — Z951 Presence of aortocoronary bypass graft: Secondary | ICD-10-CM

## 2024-05-11 DIAGNOSIS — Z1331 Encounter for screening for depression: Secondary | ICD-10-CM | POA: Diagnosis not present

## 2024-05-11 DIAGNOSIS — E119 Type 2 diabetes mellitus without complications: Secondary | ICD-10-CM | POA: Diagnosis not present

## 2024-05-11 DIAGNOSIS — Z955 Presence of coronary angioplasty implant and graft: Secondary | ICD-10-CM

## 2024-05-11 DIAGNOSIS — I10 Essential (primary) hypertension: Secondary | ICD-10-CM | POA: Diagnosis not present

## 2024-05-11 NOTE — Progress Notes (Signed)
 Cardiac Individual Treatment Plan  Patient Details  Name: Marcus Huang MRN: [id number] Date of Birth: 12/02/1950 Referring Provider:   Flowsheet Row Cardiac Rehab from 01/20/2024 in [facility] Cardiac and Pulmonary Rehab  Referring Provider Dr. 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 (Patient not taking: Reported on 05/05/2024), Disp: , Rfl:    amiodarone  (PACERONE ) 200 MG tablet, 200 mg., Disp: , Rfl:    amLODipine  (NORVASC ) 10 MG tablet, Take 1 tablet by mouth daily., Disp: , Rfl:    amoxicillin -clavulanate (AUGMENTIN ) 875-125 MG tablet, Take 1 tablet by mouth 2 (two) times daily for 7 days., Disp: 14 tablet, Rfl: 0   aspirin  EC 81 MG tablet, Take 81 mg by mouth daily. Swallow whole., Disp: , Rfl:    chlorhexidine  (HIBICLENS ) 4 % external liquid, Apply 15 mLs (1 Application total) topically as directed for 30 doses. Use as directed daily for 5 days every other week for 6 weeks., Disp: 946 mL, Rfl: 1   Continuous Glucose Sensor (FREESTYLE LIBRE 3 PLUS SENSOR) MISC, FOLLOW PACKAGE DIRECTIONS CHANGE EVERY 15 DAYS, Disp: , Rfl:    lisinopril  (ZESTRIL ) 5 MG tablet, Take 1 tablet (5 mg total) by mouth daily., Disp: 30 tablet, Rfl: 1   metoprolol  tartrate (LOPRESSOR ) 50 MG tablet, Take 1 tablet (50 mg total) by mouth 2 (two) times daily. (Patient taking differently: Take 25 mg by mouth 2 (two) times daily. Take half in the morning and half in the evening), Disp: 60 tablet, Rfl: 1   mupirocin  ointment (BACTROBAN ) 2 %, Place 1 Application into the nose 2 (two) times daily for 60 doses. Use as directed 2 times  daily for 5 days every other week for 6 weeks., Disp: 60 g, Rfl: 0   Omega-3 Fatty Acids (OMEGA 3 500 PO), Take 500 mg by mouth daily. (Patient not taking: Reported on 05/05/2024), Disp: , Rfl:    ONETOUCH ULTRA TEST test strip, 1 each 3 (three) times daily., Disp: , Rfl:    PREVIDENT 5000 SENSITIVE 1.1-5 % GEL, Place 1 Application onto teeth 2 (two) times daily., Disp: , Rfl:    REPATHA SURECLICK 140 MG/ML SOAJ, Inject 1 mL into the skin every 14 (fourteen) days., Disp: , Rfl:   Past Medical History: Past Medical History:  Diagnosis Date   Carotid artery thrombosis    Left Carotid Artery   Coronary artery disease    Diabetes (HCC)    Diverticulosis    History of kidney stones    Hyperlipidemia    Hypertension    Renal mass    Sleep apnea    C-PAP    Tobacco Use: Social History   Tobacco Use  Smoking Status Never  Smokeless Tobacco Never    Labs: Review Flowsheet       Latest Ref Rng & Units 12/08/2023 12/10/2023 12/12/2023  Labs for ITP Cardiac and Pulmonary Rehab  Hemoglobin A1c 4.8 - 5.6 % 5.6  - -  PH, Arterial 7.35 - 7.45 - 7.317  7.356  7.386  7.419  7.416  7.298  7.387  7.376   PCO2 arterial  32 - 48 mmHg - 43.3  41.6  37.1  33.4  35.2  51.0  39.0  35.8   Bicarbonate 20.0 - 28.0 mmol/L - 22.0  23.2  22.7  21.6  22.6  23.1  25.0  23.5  20.9   TCO2 22 - 32 mmol/L - 23  24  24  21  23  23  24  25  24  27  25  24  25  22    Acid-base deficit 0.0 - 2.0 mmol/L - 4.0  2.0  3.0  3.0  2.0  3.0  2.0  1.0  4.0   O2 Saturation % - 93  93  97  100  100  87  100  100  91     Details       Multiple values from one day are sorted in reverse-chronological order          Exercise Target Goals: Exercise Program Goal: Individual exercise prescription set using results from initial 6 min walk test and THRR while considering  patient's activity barriers and safety.   Exercise Prescription Goal: Initial exercise prescription builds to 30-45 minutes a day of aerobic activity, 2-3  days per week.  Home exercise guidelines will be given to patient during program as part of exercise prescription that the participant will acknowledge.   Education: Aerobic Exercise: - Group verbal and visual presentation on the components of exercise prescription. Introduces F.I.T.T principle from ACSM for exercise prescriptions.  Reviews F.I.T.T. principles of aerobic exercise including progression. Written material provided at class time. Flowsheet Row Cardiac Rehab from 03/31/2024 in [facility] Cardiac and Pulmonary Rehab  Education need identified 01/20/24    Education: Resistance Exercise: - Group verbal and visual presentation on the components of exercise prescription. Introduces F.I.T.T principle from ACSM for exercise prescriptions  Reviews F.I.T.T. principles of resistance exercise including progression. Written material provided at class time. Flowsheet Row Cardiac Rehab from 11/06/2022 in [facility] Cardiac and Pulmonary Rehab  Date 10/09/22  Educator [facility]  Instruction Review Code 1- Verbalizes Understanding     Education: Exercise & Equipment Safety: - Individual verbal instruction and demonstration of equipment use and safety with use of the equipment. Flowsheet Row Cardiac Rehab from 03/31/2024 in [facility] Cardiac and Pulmonary Rehab  Date 01/18/24  Educator jh  Instruction Review Code 1- Verbalizes Understanding    Education: Exercise Physiology & General Exercise Guidelines: - Group verbal and written instruction with models to review the exercise physiology of the cardiovascular system and associated critical values. Provides general exercise guidelines with specific guidelines to those with heart or lung disease. Written material provided at class time.   Education: Flexibility, Balance, Mind/Body Relaxation: - Group verbal and visual presentation with interactive activity on the components of exercise prescription. Introduces F.I.T.T principle from ACSM for exercise prescriptions.  Reviews F.I.T.T. principles of flexibility and balance exercise training including progression. Also discusses the mind body connection.  Reviews various relaxation techniques to help reduce and manage stress (i.e. Deep breathing, progressive muscle relaxation, and visualization). Balance handout provided to take home. Written material provided at class time. Flowsheet Row Cardiac Rehab from 11/06/2022 in [facility] Cardiac and Pulmonary Rehab  Date 10/16/22  Educator [facility]  Instruction Review Code 1- Verbalizes Understanding    Activity Barriers & Risk Stratification:  Activity Barriers & Cardiac Risk Stratification - 01/20/24 9077       Activity Barriers & Cardiac Risk Stratification   Activity Barriers None;Other (comment)    Comments lifting restrictions <  10 lb    Cardiac Risk Stratification High          6 Minute Walk:  6 Minute Walk     Row Name 01/20/24 0924         6 Minute Walk   Phase Initial     Distance 1560 feet     Walk Time 6 minutes     # of Rest Breaks 0     MPH 2.95     METS 3.32     RPE 8     Perceived Dyspnea  0     VO2 Peak 11.63     Symptoms No     Resting HR 46 bpm     Resting BP 124/64     Resting Oxygen Saturation  100 %     Exercise Oxygen Saturation  during 6 min walk 99 %     Max Ex. HR 72 bpm     Max Ex. BP 148/66     2 Minute Post BP 140/70        Oxygen Initial Assessment:   Oxygen Re-Evaluation:   Oxygen Discharge (Final Oxygen Re-Evaluation):   Initial Exercise Prescription:  Initial Exercise Prescription - 01/20/24 0900       Date of Initial Exercise RX and Referring Provider   Date 01/20/24    Referring Provider Dr. Marsa Dooms      Oxygen   Maintain Oxygen Saturation 88% or higher      Treadmill   MPH 2.8    Grade 1    Minutes 15    METs 3.53      Elliptical   Level 1    Speed 3    Minutes 15    METs 3.32      REL-XR   Level 3    Speed 50    Minutes 15    METs 3.32      Prescription Details    Frequency (times per week) 2    Duration Progress to 30 minutes of continuous aerobic without signs/symptoms of physical distress      Intensity   THRR 40-80% of Max Heartrate 87-129    Ratings of Perceived Exertion 11-13    Perceived Dyspnea 0-4      Progression   Progression Continue to progress workloads to maintain intensity without signs/symptoms of physical distress.      Resistance Training   Training Prescription Yes    Weight 4 lb    Reps 10-15          Perform Capillary Blood Glucose checks as needed.  Exercise Prescription Changes:   Exercise Prescription Changes     Row Name 01/20/24 0900 02/04/24 1400 02/18/24 1600 03/03/24 1600 03/08/24 0900     Response to Exercise   Blood Pressure (Admit) 124/64 110/60 122/62 118/62 --   Blood Pressure (Exercise) 148/66 136/64 144/76 148/60 --   Blood Pressure (Exit) 140/70 108/58 114/66 124/66 --   Heart Rate (Admit) 46 bpm 43 bpm 53 bpm 68 bpm --   Heart Rate (Exercise) 72 bpm 107 bpm 91 bpm 93 bpm --   Heart Rate (Exit) 48 bpm 50 bpm 62 bpm 50 bpm --   Oxygen Saturation (Admit) 100 % -- -- -- --   Oxygen Saturation (Exercise) 99 % -- -- -- --   Rating of Perceived Exertion (Exercise) 8 13 14 12  --   Perceived Dyspnea (Exercise) 0 0 -- -- --   Symptoms none none none none --  Comments Results first two weeks of exercise -- -- --   Duration -- Progress to 30 minutes of  aerobic without signs/symptoms of physical distress Continue with 30 min of aerobic exercise without signs/symptoms of physical distress. Continue with 30 min of aerobic exercise without signs/symptoms of physical distress. Continue with 30 min of aerobic exercise without signs/symptoms of physical distress.   Intensity -- THRR unchanged THRR unchanged THRR unchanged THRR unchanged     Progression   Progression -- Continue to progress workloads to maintain intensity without signs/symptoms of physical distress. Continue to progress workloads to  maintain intensity without signs/symptoms of physical distress. Continue to progress workloads to maintain intensity without signs/symptoms of physical distress. Continue to progress workloads to maintain intensity without signs/symptoms of physical distress.   Average METs -- 4.02 4.27 4.47 4.47     Resistance Training   Training Prescription -- Yes Yes Yes Yes   Weight -- 4lb 4 lb 10lb 10lb   Reps -- 10-15 10-15 10-15 10-15     Interval Training   Interval Training -- No No No No     Treadmill   MPH -- 2.9 3 3 3    Grade -- 2 4.5 4.5 4.5   Minutes -- 15 15 15 15    METs -- 4.02 5.16 5.16 5.16     Elliptical   Level -- -- 1 1 1    Speed -- -- 3 -- --   Minutes -- -- 15 15 15    METs -- -- 3.1 4.2 4.2     REL-XR   Level -- 5 6 5 5    Minutes -- 15 15 15 15    METs -- -- -- 3.6 3.6     Home Exercise Plan   Plans to continue exercise at -- -- -- -- Home (comment)  walking and hand weights.   Frequency -- -- -- -- Add 2 additional days to program exercise sessions.   Initial Home Exercises Provided -- -- -- -- 03/08/24     Oxygen   Maintain Oxygen Saturation -- 88% or higher 88% or higher 88% or higher 88% or higher    Row Name 03/15/24 1300 03/30/24 1300 04/12/24 0800 04/28/24 0900       Response to Exercise   Blood Pressure (Admit) 102/60 126/58 124/62 122/60    Blood Pressure (Exit) 108/62 110/58 124/64 118/62    Heart Rate (Admit) 64 bpm 74 bpm 61 bpm 72 bpm    Heart Rate (Exercise) 99 bpm 97 bpm 120 bpm 119 bpm    Heart Rate (Exit) 62 bpm 66 bpm 77 bpm 73 bpm    Rating of Perceived Exertion (Exercise) 11 12 13 12     Symptoms none none none none    Duration Continue with 30 min of aerobic exercise without signs/symptoms of physical distress. Continue with 30 min of aerobic exercise without signs/symptoms of physical distress. Continue with 30 min of aerobic exercise without signs/symptoms of physical distress. Continue with 30 min of aerobic exercise without signs/symptoms  of physical distress.    Intensity THRR unchanged THRR unchanged THRR unchanged THRR unchanged      Progression   Progression Continue to progress workloads to maintain intensity without signs/symptoms of physical distress. Continue to progress workloads to maintain intensity without signs/symptoms of physical distress. Continue to progress workloads to maintain intensity without signs/symptoms of physical distress. Continue to progress workloads to maintain intensity without signs/symptoms of physical distress.    Average METs 4.86 4.92 5.25 5.35  Resistance Training   Training Prescription Yes Yes Yes Yes    Weight 10 lb 10 lb 10 lb 10 lb    Reps 10-15 10-15 10-15 10-15      Interval Training   Interval Training No No No No      Treadmill   MPH 3 3 3 3     Grade 7 6.5 7 8     Minutes 15 15 15 15     METs 6.19 5.99 6.19 6.61      Elliptical   Level 3 3 3 3     Speed 6 0 0 4    Minutes 15 15 15 15     METs 5.5 4.1 4.5 4.1      REL-XR   Level 7 -- -- --    Minutes 15 -- -- --    METs 3.7 -- -- --      Rower   Level -- 5 7 6     Watts -- 25 44 51    Minutes -- 15 15 15     METs -- 4.23 4.23 4.22      Home Exercise Plan   Plans to continue exercise at Home (comment)  walking and hand weights. Home (comment)  walking and hand weights. Home (comment)  walking and hand weights. Home (comment)  walking and hand weights.    Frequency Add 2 additional days to program exercise sessions. Add 2 additional days to program exercise sessions. Add 2 additional days to program exercise sessions. Add 2 additional days to program exercise sessions.    Initial Home Exercises Provided 03/08/24 03/08/24 03/08/24 03/08/24      Oxygen   Maintain Oxygen Saturation 88% or higher 88% or higher 88% or higher 88% or higher       Exercise Comments:   Exercise Comments     Row Name 01/28/24 1029           Exercise Comments irst full day of exercise!  Patient was oriented to gym and equipment  including functions, settings, policies, and procedures.  Patient's individual exercise prescription and treatment plan were reviewed.  All starting workloads were established based on the results of the 6 minute walk test done at initial orientation visit.  The plan for exercise progression was also introduced and progression will be customized based on patient's performance and goals.          Exercise Goals and Review:   Exercise Goals     Row Name 01/20/24 0923             Exercise Goals   Increase Physical Activity Yes       Intervention Provide advice, education, support and counseling about physical activity/exercise needs.;Develop an individualized exercise prescription for aerobic and resistive training based on initial evaluation findings, risk stratification, comorbidities and participant's personal goals.       Expected Outcomes Short Term: Attend rehab on a regular basis to increase amount of physical activity.;Long Term: Add in home exercise to make exercise part of routine and to increase amount of physical activity.;Long Term: Exercising regularly at least 3-5 days a week.       Increase Strength and Stamina Yes       Intervention Provide advice, education, support and counseling about physical activity/exercise needs.;Develop an individualized exercise prescription for aerobic and resistive training based on initial evaluation findings, risk stratification, comorbidities and participant's personal goals.       Expected Outcomes Short Term: Increase workloads from initial exercise prescription for resistance,  speed, and METs.;Short Term: Perform resistance training exercises routinely during rehab and add in resistance training at home;Long Term: Improve cardiorespiratory fitness, muscular endurance and strength as measured by increased METs and functional capacity ( )       Able to understand and use rate of perceived exertion (RPE) scale Yes       Intervention Provide  education and explanation on how to use RPE scale       Expected Outcomes Short Term: Able to use RPE daily in rehab to express subjective intensity level;Long Term:  Able to use RPE to guide intensity level when exercising independently       Able to understand and use Dyspnea scale Yes       Intervention Provide education and explanation on how to use Dyspnea scale       Expected Outcomes Short Term: Able to use Dyspnea scale daily in rehab to express subjective sense of shortness of breath during exertion;Long Term: Able to use Dyspnea scale to guide intensity level when exercising independently       Knowledge and understanding of Target Heart Rate Range (THRR) Yes       Intervention Provide education and explanation of THRR including how the numbers were predicted and where they are located for reference       Expected Outcomes Short Term: Able to state/look up THRR;Long Term: Able to use THRR to govern intensity when exercising independently;Short Term: Able to use daily as guideline for intensity in rehab       Able to check pulse independently Yes       Intervention Provide education and demonstration on how to check pulse in carotid and radial arteries.;Review the importance of being able to check your own pulse for safety during independent exercise       Expected Outcomes Short Term: Able to explain why pulse checking is important during independent exercise       Understanding of Exercise Prescription Yes       Intervention Provide education, explanation, and written materials on patient's individual exercise prescription       Expected Outcomes Short Term: Able to explain program exercise prescription;Long Term: Able to explain home exercise prescription to exercise independently          Exercise Goals Re-Evaluation :  Exercise Goals Re-Evaluation     Row Name 01/28/24 1029 02/04/24 1424 02/11/24 0941 02/18/24 1609 02/23/24 0939     Exercise Goal Re-Evaluation   Exercise Goals  Review -- Increase Physical Activity;Increase Strength and Stamina;Understanding of Exercise Prescription Increase Physical Activity;Increase Strength and Stamina;Understanding of Exercise Prescription Increase Physical Activity;Increase Strength and Stamina;Understanding of Exercise Prescription Increase Physical Activity;Increase Strength and Stamina;Understanding of Exercise Prescription   Comments Reviewed RPE and dyspnea scale, THR and program prescription with pt today.  Pt voiced understanding and was given a copy of goals to take home. Dorthy is off to a good start in the program. He was able to attend his first and only session during this review period. During his session he was able to use the treadmill at a speed of 2.9 mph and an incline of 2%, as well as the XR at level 5. We will continue to monitor his progress in the program. Dantrell is doing well at rehab, he is increasing workload as able. At home he is doing some hand weight exercise and walking 3-5 times per day Parthiv continues to do well in rehab. He increased his treadmill worload to a speed of  3 mph with an incline of 4.5%. He also began using the elliptical at level 1 and improved to level 6 on the XR. We will continue to monitor his progress in the program. Olufemi is doing well at rehab, he is increasing his workload during visits. He says the consistentcy of attending rehab has helped him improve in stamina. He is walking and using handweights most days at home.   Expected Outcomes Short: Use RPE daily to regulate intensity. Long: Follow program prescription in THR. Short: Continue to follow exercise prescription. Long: Continue exercise to improve strength and stamina. STG: Increase workload as able. LTG: Continue exercise to improve strength and stamina. Short: Continue to progressively increase treadmill workload. Long: Continue exercise to improve strength and stamina. STG: Continue to attend rehab and exercise at home. Continue  exercise to improve strength and stamina.    Row Name 03/03/24 1635 03/08/24 0943 03/15/24 1332 03/30/24 1329 04/12/24 0805     Exercise Goal Re-Evaluation   Exercise Goals Review Increase Physical Activity;Increase Strength and Stamina;Understanding of Exercise Prescription Increase Physical Activity;Able to understand and use rate of perceived exertion (RPE) scale;Knowledge and understanding of Target Heart Rate Range (THRR);Understanding of Exercise Prescription;Increase Strength and Stamina;Able to understand and use Dyspnea scale;Able to check pulse independently Increase Physical Activity;Increase Strength and Stamina;Understanding of Exercise Prescription Increase Physical Activity;Increase Strength and Stamina;Understanding of Exercise Prescription Increase Physical Activity;Increase Strength and Stamina;Understanding of Exercise Prescription   Comments Aryaan continues to do well in rehab. He was able to maintain his workload on the treadmill at and 4.5% incline. He was also able to maintain level 1 on the elliptical. We will continue to encourage and monitor his progress in the program. Reviewed home exercise with pt today.  Pt plans to walk and use hand weights for exercise.  Reviewed THR, pulse, RPE, sign and symptoms, pulse oximetery and when to call 911 or MD.  Also discussed weather considerations and indoor options.  Pt voiced understanding. Delton continues to do well in rehab. He recently increased his treadmill workload by increasing his incline to 7% while maintaining his speed at 3 mph. He also improved to level 3 on the elliptical and level 7 on the XR. We will continue to monitor his progress in the program. Mayes is doing well in rehab. He has been able to maintain his workload on the treadmill at a speed of and 6.5% incline. He was recently able to add the rowing machine to his exercise prescription at level 5. We will continue to monitor his progress in the program. Boleslaw  continues to do well in rehab. He increased his treadmill workload back up to an incline of 7% while maintaining a speed of 3 mph. He also improved to level 7 on the rower machine. We will continue to monitor his progress in the program.   Expected Outcomes Short: Increase treadmill incline to 5%. Long: Continue exercise to imptove strength and stamina. Short: add 1-2 days a week of exercies at home on off days of cardiac rehab. Long: maintain independent exercise routine upon graduation from cardiac rehab. Short: Continue to progressively increase treadmill workload. Long: Continue exercise to improve strength and stamina. Short: Continue to progressively increase treadmill workload. Long: Continue exercise to improve strength and stamina. Short: Continue to progressively increase treadmill workload. Long: Continue exercise to improve strength and stamina.    Row Name 04/12/24 1000 04/28/24 0935           Exercise Goal Re-Evaluation  Exercise Goals Review Increase Physical Activity;Increase Strength and Stamina;Understanding of Exercise Prescription Increase Physical Activity;Increase Strength and Stamina;Understanding of Exercise Prescription      Comments Cledis continues to exercise at home. He has a walking pad at home that he uses everyday that he doesnt attend the program. He also has access to handweights at home that he uses, and tries to do the same workouts as in the program. Ladavion continues to do well in the program. He recently increased his treadmill incline to 8% while maintaining his speed at 3 mph. He also continues to work at level 3 on the elliptical and level 6 on the rower. We will continue to monitor his progress in the program.      Expected Outcomes Short: Continue to exercise at home. Long: Exercise in the program and at home to improve strength and stamina. Short: Continue to progressively increase treadmill workload. Long: Continue exercise to improve strength and stamina.          Discharge Exercise Prescription (Final Exercise Prescription Changes):  Exercise Prescription Changes - 04/28/24 0900       Response to Exercise   Blood Pressure (Admit) 122/60    Blood Pressure (Exit) 118/62    Heart Rate (Admit) 72 bpm    Heart Rate (Exercise) 119 bpm    Heart Rate (Exit) 73 bpm    Rating of Perceived Exertion (Exercise) 12    Symptoms none    Duration Continue with 30 min of aerobic exercise without signs/symptoms of physical distress.    Intensity THRR unchanged      Progression   Progression Continue to progress workloads to maintain intensity without signs/symptoms of physical distress.    Average METs 5.35      Resistance Training   Training Prescription Yes    Weight 10 lb    Reps 10-15      Interval Training   Interval Training No      Treadmill   MPH 3    Grade 8    Minutes 15    METs 6.61      Elliptical   Level 3    Speed 4    Minutes 15    METs 4.1      Rower   Level 6    Watts 51    Minutes 15    METs 4.22      Home Exercise Plan   Plans to continue exercise at Home (comment)   walking and hand weights.   Frequency Add 2 additional days to program exercise sessions.    Initial Home Exercises Provided 03/08/24      Oxygen   Maintain Oxygen Saturation 88% or higher          Nutrition:  Target Goals: Understanding of nutrition guidelines, daily intake of sodium 1500mg , cholesterol 200mg , calories 30% from fat and 7% or less from saturated fats, daily to have 5 or more servings of fruits and vegetables.  Education: Nutrition 1 -Group instruction provided by verbal, written material, interactive activities, discussions, models, and posters to present general guidelines for heart healthy nutrition including macronutrients, label reading, and promoting whole foods over processed counterparts. Education serves as Pensions consultant of discussion of heart healthy eating for all. Written material provided at class  time.    Education: Nutrition 2 -Group instruction provided by verbal, written material, interactive activities, discussions, models, and posters to present general guidelines for heart healthy nutrition including sodium, cholesterol, and saturated fat. Providing guidance of habit forming  to improve blood pressure, cholesterol, and body weight. Written material provided at class time.     Biometrics:  Pre Biometrics - 01/20/24 0923       Pre Biometrics   Height 5' 7.5 (1.715 m)    Weight 162 lb 4.8 oz (73.6 kg)    Waist Circumference 35 inches    Hip Circumference 37.5 inches    Waist to Hip Ratio 0.93 %    BMI (Calculated) 25.03    Single Leg Stand 30 seconds           Nutrition Therapy Plan and Nutrition Goals:  Nutrition Therapy & Goals - 01/20/24 0920       Nutrition Therapy   RD appointment deferred Yes      Intervention Plan   Intervention Prescribe, educate and counsel regarding individualized specific dietary modifications aiming towards targeted core components such as weight, hypertension, lipid management, diabetes, heart failure and other comorbidities.    Expected Outcomes Short Term Goal: Understand basic principles of dietary content, such as calories, fat, sodium, cholesterol and nutrients.;Short Term Goal: A plan has been developed with personal nutrition goals set during dietitian appointment.;Long Term Goal: Adherence to prescribed nutrition plan.          Nutrition Assessments:  MEDIFICTS Score Key: >=70 Need to make dietary changes  40-70 Heart Healthy Diet <= 40 Therapeutic Level Cholesterol Diet  Flowsheet Row Cardiac Rehab from 01/20/2024 in [facility] Cardiac and Pulmonary Rehab  Picture Your Plate Total Score on Admission 64   Picture Your Plate Scores: <59 Unhealthy dietary pattern with much room for improvement. 41-50 Dietary pattern unlikely to meet recommendations for good health and room for improvement. 51-60 More healthful dietary  pattern, with some room for improvement.  >60 Healthy dietary pattern, although there may be some specific behaviors that could be improved.    Nutrition Goals Re-Evaluation:  Nutrition Goals Re-Evaluation     Row Name 02/11/24 [phone] 02/23/24 0951 04/12/24 0950         Goals   Current Weight -- -- 170 lb 9.6 oz (77.4 kg)     Comment Arley deffered RD appointment Deferred Deferred        Nutrition Goals Discharge (Final Nutrition Goals Re-Evaluation):  Nutrition Goals Re-Evaluation - 04/12/24 0950       Goals   Current Weight 170 lb 9.6 oz (77.4 kg)    Comment Deferred          Psychosocial: Target Goals: Acknowledge presence or absence of significant depression and/or stress, maximize coping skills, provide positive support system. Participant is able to verbalize types and ability to use techniques and skills needed for reducing stress and depression.   Education: Stress, Anxiety, and Depression - Group verbal and visual presentation to define topics covered.  Reviews how body is impacted by stress, anxiety, and depression.  Also discusses healthy ways to reduce stress and to treat/manage anxiety and depression. Written material provided at class time. Flowsheet Row Cardiac Rehab from 03/31/2024 in [facility] Cardiac and Pulmonary Rehab  Date 03/31/24  Educator SB  Instruction Review Code 1- [employer] Understanding    Education: Sleep Hygiene -Provides group verbal and written instruction about how sleep can affect your health.  Define sleep hygiene, discuss sleep cycles and impact of sleep habits. Review good sleep hygiene tips.   Initial Review & Psychosocial Screening:  Initial Psych Review & Screening - 01/18/24 1340       Initial Review   Current issues with None Identified  Family Dynamics   Good Support System? Yes    Comments He can look to his friends and family for support. 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       Barriers   Psychosocial barriers to participate in program There are no identifiable barriers or psychosocial needs.;The patient should benefit from training in stress management and relaxation.      Screening Interventions   Interventions Encouraged to exercise;Provide feedback about the scores to participant;To provide support and resources with identified psychosocial needs    Expected Outcomes Short Term goal: Utilizing psychosocial counselor, staff and physician to assist with identification of specific Stressors or current issues interfering with healing process. Setting desired goal for each stressor or current issue identified.;Long Term Goal: Stressors or current issues are controlled or eliminated.;Long Term goal: The participant improves quality of Life and PHQ9 Scores as seen by post scores and/or verbalization of changes;Short Term goal: Identification and review with participant of any Quality of Life or Depression concerns found by scoring the questionnaire.          Quality of Life Scores:   Quality of Life - 01/20/24 0919       Quality of Life   Select Quality of Life      Quality of Life Scores   Health/Function Pre 17.5 %    Socioeconomic Pre 21.63 %    Psych/Spiritual Pre 22.5 %    Family Pre 19.6 %    GLOBAL Pre 19.74 %         Scores of 19 and below usually indicate a poorer quality of life in these areas.  A difference of  2-3 points is a clinically meaningful difference.  A difference of 2-3 points in the total score of the Quality of Life Index has been associated with significant improvement in overall quality of life, self-image, physical symptoms, and general health in studies assessing change in quality of life.  PHQ-9: Review Flowsheet  More data may exist      02/23/2024 01/20/2024 11/06/2022 10/09/2022 09/17/2022  Depression screen PHQ 2/9  Decreased Interest 1 1 0 0 0  Down, Depressed, Hopeless 1 1 0 1 1  PHQ - 2 Score 2 2 0 1 1  Altered sleeping  2 2 0 1 3  Tired, decreased energy 2 2 1 1 1   Change in appetite 0 0 0 0 0  Feeling bad or failure about yourself  0 0 0 1 1  Trouble concentrating 0 1 1 1  0  Moving slowly or fidgety/restless 0 0 1 0 1  Suicidal thoughts 0 0 0 0 0  PHQ-9 Score 6 7 3 5 7   Difficult doing work/chores Somewhat difficult Not difficult at all Not difficult at all Somewhat difficult Not difficult at all   Interpretation of Total Score  Total Score Depression Severity:  1-4 = Minimal depression, 5-9 = Mild depression, 10-14 = Moderate depression, 15-19 = Moderately severe depression, 20-27 = Severe depression   Psychosocial Evaluation and Intervention:  Psychosocial Evaluation - 01/18/24 1341       Psychosocial Evaluation & Interventions   Interventions Encouraged to exercise with the program and follow exercise prescription;Relaxation education;Stress management education    Comments He can look to his friends and family for support. 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    Expected Outcomes Short: Start HeartTracK to help with mood. Long: Maintain a  healthy mental state    Continue Psychosocial Services  Follow up required by staff          Psychosocial Re-Evaluation:  Psychosocial Re-Evaluation     Row Name 02/11/24 [phone] 02/23/24 0943 04/12/24 0946         Psychosocial Re-Evaluation   Current issues with Current Sleep Concerns Current Sleep Concerns Current Sleep Concerns     Comments Selassie struggles to stay asleep and wakes up 4-5 times per night. Reports he gets 2-7hrs of sleep. He has been working with his Dr to improve it. Ivan denies any stress, depression, or anxiety. He is still struggling with sleep. Has a apnea machine but reports it doesn't help. Maurilio is not dealing with any stressors at this time. He reports that rehab has drastically improved his sleep habits. He has returned to sleeping in his bed as opposed to the recliner.     Expected  Outcomes STG: focus on good sleep hygiene. LTG: Achieve and maintain a positive outlook on health and daily life STG: Focus on good sleep hygiene. LTG: Achieve and maintain a positive outlook on health and daily life Short: Continue to improve sleeping habits, manage stressors in healthy means. Long: Continue to attend rehab to help manage stressors.     Interventions Encouraged to attend Cardiac Rehabilitation for the exercise Encouraged to attend Cardiac Rehabilitation for the exercise Encouraged to attend Cardiac Rehabilitation for the exercise     Continue Psychosocial Services  Follow up required by staff Follow up required by staff Follow up required by staff        Psychosocial Discharge (Final Psychosocial Re-Evaluation):  Psychosocial Re-Evaluation - 04/12/24 0946       Psychosocial Re-Evaluation   Current issues with Current Sleep Concerns    Comments Rachard is not dealing with any stressors at this time. He reports that rehab has drastically improved his sleep habits. He has returned to sleeping in his bed as opposed to the recliner.    Expected Outcomes Short: Continue to improve sleeping habits, manage stressors in healthy means. Long: Continue to attend rehab to help manage stressors.    Interventions Encouraged to attend Cardiac Rehabilitation for the exercise    Continue Psychosocial Services  Follow up required by staff          Vocational Rehabilitation: Provide vocational rehab assistance to qualifying candidates.   Vocational Rehab Evaluation & Intervention:   Education: Education Goals: Education classes will be provided on a variety of topics geared toward better understanding of heart health and risk factor modification. Participant will state understanding/return demonstration of topics presented as noted by education test scores.  Learning Barriers/Preferences:  Learning Barriers/Preferences - 01/18/24 1340       Learning Barriers/Preferences   Learning  Barriers None    Learning Preferences None          General Cardiac Education Topics:  AED/CPR: - Group verbal and written instruction with the use of models to demonstrate the basic use of the AED with the basic ABC's of resuscitation.   Test and Procedures: - Group verbal and visual presentation and models provide information about basic cardiac anatomy and function. Reviews the testing methods done to diagnose heart disease and the outcomes of the test results. Describes the treatment choices: Medical Management, Angioplasty, or Coronary Bypass Surgery for treating various heart conditions including Myocardial Infarction, Angina, Valve Disease, and Cardiac Arrhythmias. Written material provided at class time. Flowsheet Row Cardiac Rehab from 03/31/2024 in [facility] Cardiac and  Pulmonary Rehab  Education need identified 01/20/24    Medication Safety: - Group verbal and visual instruction to review commonly prescribed medications for heart and lung disease. Reviews the medication, class of the drug, and side effects. Includes the steps to properly store meds and maintain the prescription regimen. Written material provided at class time. Flowsheet Row Cardiac Rehab from 11/06/2022 in [facility] Cardiac and Pulmonary Rehab  Date 11/06/22  Educator [facility]  Instruction Review Code 1- Verbalizes Understanding    Intimacy: - Group verbal instruction through game format to discuss how heart and lung disease can affect sexual intimacy. Written material provided at class time.   Know Your Numbers and Heart Failure: - Group verbal and visual instruction to discuss disease risk factors for cardiac and pulmonary disease and treatment options.  Reviews associated critical values for Overweight/Obesity, Hypertension, Cholesterol, and Diabetes.  Discusses basics of heart failure: signs/symptoms and treatments.  Introduces Heart Failure Zone chart for action plan for heart failure. Written material provided at class  time. Flowsheet Row Cardiac Rehab from 03/31/2024 in [facility] Cardiac and Pulmonary Rehab  Education need identified 01/20/24    Infection Prevention: - Provides verbal and written material to individual with discussion of infection control including proper hand washing and proper equipment cleaning during exercise session. Flowsheet Row Cardiac Rehab from 03/31/2024 in [facility] Cardiac and Pulmonary Rehab  Date 01/18/24  Educator jh  Instruction Review Code 1- Verbalizes Understanding    Falls Prevention: - Provides verbal and written material to individual with discussion of falls prevention and safety. Flowsheet Row Cardiac Rehab from 03/31/2024 in [facility] Cardiac and Pulmonary Rehab  Date 01/18/24  Educator jh  Instruction Review Code 1- Verbalizes Understanding    Other: -Provides group and verbal instruction on various topics (see comments) Flowsheet Row Cardiac Rehab from 11/06/2022 in [facility] Cardiac and Pulmonary Rehab  Date 10/30/22  Educator SB  Instruction Review Code 1- Verbalizes Understanding    Knowledge Questionnaire Score:  Knowledge Questionnaire Score - 01/20/24 0919       Knowledge Questionnaire Score   Pre Score 23/26          Core Components/Risk Factors/Patient Goals at Admission:  Personal Goals and Risk Factors at Admission - 01/18/24 1339       Core Components/Risk Factors/Patient Goals on Admission    Weight Management Yes;Weight Maintenance    Intervention Weight Management: Develop a combined nutrition and exercise program designed to reach desired caloric intake, while maintaining appropriate intake of nutrient and fiber, sodium and fats, and appropriate energy expenditure required for the weight goal.;Weight Management: Provide education and appropriate resources to help participant work on and attain dietary goals.;Weight Management/Obesity: Establish reasonable short term and long term weight goals.    Expected Outcomes Short Term: Continue to assess and  modify interventions until short term weight is achieved;Weight Maintenance: Understanding of the daily nutrition guidelines, which includes 25-35% calories from fat, 7% or less cal from saturated fats, less than 200mg  cholesterol, less than 1.5gm of sodium, & 5 or more servings of fruits and vegetables daily;Understanding recommendations for meals to include 15-35% energy as protein, 25-35% energy from fat, 35-60% energy from carbohydrates, less than 200mg  of dietary cholesterol, 20-35 gm of total fiber daily;Understanding of distribution of calorie intake throughout the day with the consumption of 4-5 meals/snacks    Diabetes Yes    Intervention Provide education about signs/symptoms and action to take for hypo/hyperglycemia.;Provide education about proper nutrition, including hydration, and aerobic/resistive exercise prescription along with prescribed  medications to achieve blood glucose in normal ranges: Fasting glucose 65-99 mg/dL    Expected Outcomes Short Term: Participant verbalizes understanding of the signs/symptoms and immediate care of hyper/hypoglycemia, proper foot care and importance of medication, aerobic/resistive exercise and nutrition plan for blood glucose control.;Long Term: Attainment of HbA1C < 7%.    Hypertension Yes    Intervention Provide education on lifestyle modifcations including regular physical activity/exercise, weight management, moderate sodium restriction and increased consumption of fresh fruit, vegetables, and low fat dairy, alcohol moderation, and smoking cessation.;Monitor prescription use compliance.    Expected Outcomes Short Term: Continued assessment and intervention until BP is < 140/45mm HG in hypertensive participants. < 130/66mm HG in hypertensive participants with diabetes, heart failure or chronic kidney disease.;Long Term: Maintenance of blood pressure at goal levels.          Education:Diabetes - Individual verbal and written instruction to review  signs/symptoms of diabetes, desired ranges of glucose level fasting, after meals and with exercise. Acknowledge that pre and post exercise glucose checks will be done for 3 sessions at entry of program. Flowsheet Row Cardiac Rehab from 03/31/2024 in [facility] Cardiac and Pulmonary Rehab  Date 01/18/24  Educator jh  Instruction Review Code 1- Verbalizes Understanding    Core Components/Risk Factors/Patient Goals Review:   Goals and Risk Factor Review     Row Name 02/11/24 0936 02/23/24 0951 04/12/24 0951         Core Components/Risk Factors/Patient Goals Review   Personal Goals Review Hypertension Hypertension Hypertension     Review Neftali is checking his blood pressure at home, says it runs consistently around 110/60s. He reports it matches well with the readings here at rehab. Altariq reports he is checking his BP at home and runs around 110/60. says he feels better when its closer to 120/7o. Encouraged him to drink ~64oz of water , he says he is currently around ~32oz daily. Dyke is taking his BP at home when he feels unusual. He states that the program has made him feel more in control of his BP, and has stayed around 110/60.     Expected Outcomes STG: Continue to check BP regularly. LTG: manage risk factors independently STG: Drink more water , check BP at home.  LTG: manage risk factors independently Short: Check BP at home more frequently. Long: Continue to monitor BP to manage hypertension.        Core Components/Risk Factors/Patient Goals at Discharge (Final Review):   Goals and Risk Factor Review - 04/12/24 0951       Core Components/Risk Factors/Patient Goals Review   Personal Goals Review Hypertension    Review Aldo is taking his BP at home when he feels unusual. He states that the program has made him feel more in control of his BP, and has stayed around 110/60.    Expected Outcomes Short: Check BP at home more frequently. Long: Continue to monitor BP to manage hypertension.           ITP Comments:  ITP Comments     Row Name 01/18/24 1339 01/20/24 0917 01/28/24 1029 02/17/24 1041 03/16/24 1035   ITP Comments Virtual Visit completed. Patient informed on EP and RD appointment and 6 Minute walk test. Patient also informed of patient health questionnaires on My Chart. Patient Verbalizes understanding. Visit diagnosis can be found in [facility] 12/10/2023. Completed and gym orientation for cardiac rehab. Initial ITP created and sent for review to Dr. Oneil Pinal, Medical Director. irst full day of exercise!  Patient  was oriented to gym and equipment including functions, settings, policies, and procedures.  Patient's individual exercise prescription and treatment plan were reviewed.  All starting workloads were established based on the results of the 6 minute walk test done at initial orientation visit.  The plan for exercise progression was also introduced and progression will be customized based on patient's performance and goals. 30 Day review completed. Medical Director ITP review done, changes made as directed, and signed approval by Medical Director.    new to program 30 Day review completed. Medical Director ITP review done, changes made as directed, and signed approval by Medical Director.    Row Name 04/13/24 0957 05/11/24 1006         ITP Comments 30 Day review completed. Medical Director ITP review done, changes made as directed, and signed approval by Medical Director. 30 Day review completed. Medical Director ITP review done, changes made as directed, and signed approval by Medical Director.         Comments: 30 day review

## 2024-05-12 ENCOUNTER — Encounter

## 2024-05-15 NOTE — Anesthesia Postprocedure Evaluation (Signed)
 Anesthesia Post Note  Patient: Marcus Huang  Procedure(s) Performed: IRRIGATION AND DEBRIDEMENT WOUND RIGHT INDEX FINGER, EXPLORATION WITH IRRIGATION AND DEBRIDEMENT OF FLEXOR SHEATH WITH RELEASE OF A-1 PULLEY (Right: Index Finger)  Patient location during evaluation: PACU Anesthesia Type: General Level of consciousness: awake and alert Pain management: pain level controlled Vital Signs Assessment: post-procedure vital signs reviewed and stable Respiratory status: spontaneous breathing, nonlabored ventilation, respiratory function stable and patient connected to nasal cannula oxygen Cardiovascular status: blood pressure returned to baseline and stable Postop Assessment: no apparent nausea or vomiting Anesthetic complications: no   There were no known notable events for this encounter.   Last Vitals:  Vitals:   05/06/24 0607 05/06/24 0831  BP: (!) 142/78 (!) 165/81  Pulse:  82  Resp:  16  Temp:  37.1 C  SpO2:  99%    Last Pain:  Vitals:   05/06/24 1335  TempSrc:   PainSc: 5                  Prentice Murphy

## 2024-05-17 ENCOUNTER — Encounter: Attending: Cardiology

## 2024-05-17 DIAGNOSIS — R002 Palpitations: Secondary | ICD-10-CM | POA: Diagnosis not present

## 2024-05-17 DIAGNOSIS — E7800 Pure hypercholesterolemia, unspecified: Secondary | ICD-10-CM | POA: Diagnosis not present

## 2024-05-17 DIAGNOSIS — I25721 Atherosclerosis of autologous artery coronary artery bypass graft(s) with angina pectoris with documented spasm: Secondary | ICD-10-CM | POA: Diagnosis not present

## 2024-05-17 DIAGNOSIS — Z955 Presence of coronary angioplasty implant and graft: Secondary | ICD-10-CM | POA: Diagnosis not present

## 2024-05-17 DIAGNOSIS — I890 Lymphedema, not elsewhere classified: Secondary | ICD-10-CM | POA: Diagnosis not present

## 2024-05-17 DIAGNOSIS — Z789 Other specified health status: Secondary | ICD-10-CM | POA: Diagnosis not present

## 2024-05-17 DIAGNOSIS — Z951 Presence of aortocoronary bypass graft: Secondary | ICD-10-CM | POA: Diagnosis not present

## 2024-05-17 DIAGNOSIS — I739 Peripheral vascular disease, unspecified: Secondary | ICD-10-CM | POA: Diagnosis not present

## 2024-05-17 DIAGNOSIS — E119 Type 2 diabetes mellitus without complications: Secondary | ICD-10-CM | POA: Diagnosis not present

## 2024-05-17 DIAGNOSIS — I10 Essential (primary) hypertension: Secondary | ICD-10-CM | POA: Diagnosis not present

## 2024-05-17 NOTE — Progress Notes (Signed)
 Daily Session Note  Patient Details  Name: Marcus Huang MRN: [id number] Date of Birth: 09-22-1950 Referring Provider:   Flowsheet Row Cardiac Rehab from 01/20/2024 in [facility] Cardiac and Pulmonary Rehab  Referring Provider Dr. Marsa Dooms    Encounter Date: 05/17/2024  Check In:  Session Check In - 05/17/24 0936       Check-In   Supervising physician immediately available to respond to emergencies See telemetry face sheet for immediately available ER MD    Location [facility]-Cardiac & Pulmonary Rehab    Staff Present Burnard Davenport RN,BSN,MPA;Maxon Conetta BS, Exercise Physiologist;Laureen Delores, BS, RRT, CPFT;Jason Elnor RDN,LDN    Virtual Visit No    Medication changes reported     No    Fall or balance concerns reported    No    Tobacco Cessation No Change    Warm-up and Cool-down Performed on first and last piece of equipment    Resistance Training Performed Yes    VAD Patient? No    PAD/SET Patient? No      Pain Assessment   Currently in Pain? No/denies             Social History   Tobacco Use  Smoking Status Never  Smokeless Tobacco Never    Goals Met:  Independence with exercise equipment Exercise tolerated well No report of concerns or symptoms today Strength training completed today  Goals Unmet:  Not Applicable  Comments: Pt able to follow exercise prescription today without complaint.  Will continue to monitor for progression.    Dr. Oneil Pinal is Medical Director for [facility] Cardiac Rehabilitation.  Dr. Fuad Aleskerov is Medical Director for [facility] Pulmonary Rehabilitation.

## 2024-05-19 ENCOUNTER — Encounter: Admitting: Emergency Medicine

## 2024-05-19 DIAGNOSIS — Z951 Presence of aortocoronary bypass graft: Secondary | ICD-10-CM | POA: Diagnosis not present

## 2024-05-19 NOTE — Progress Notes (Signed)
 Daily Session Note  Patient Details  Name: Marcus Huang MRN: [id number] Date of Birth: 12/25/1950 Referring Provider:   Flowsheet Row Cardiac Rehab from 01/20/2024 in [facility] Cardiac and Pulmonary Rehab  Referring Provider Dr. Marsa Dooms    Encounter Date: 05/19/2024  Check In:  Session Check In - 05/19/24 1012       Check-In   Supervising physician immediately available to respond to emergencies See telemetry face sheet for immediately available ER MD    Location [facility]-Cardiac & Pulmonary Rehab    Staff Present Devaughn Jaeger, BS, Exercise Physiologist;Maxon Conetta BS, Exercise Physiologist;Joseph Rolinda RCP,RRT,BSRT;Florentine Diekman RN,BSN    Virtual Visit No    Medication changes reported     No    Fall or balance concerns reported    No    Tobacco Cessation No Change    Warm-up and Cool-down Performed on first and last piece of equipment    Resistance Training Performed Yes    VAD Patient? No    PAD/SET Patient? No      Pain Assessment   Currently in Pain? No/denies             Social History   Tobacco Use  Smoking Status Never  Smokeless Tobacco Never    Goals Met:  Independence with exercise equipment Exercise tolerated well No report of concerns or symptoms today Strength training completed today  Goals Unmet:  Not Applicable  Comments: Pt able to follow exercise prescription today without complaint.  Will continue to monitor for progression.    Dr. Oneil Pinal is Medical Director for [facility] Cardiac Rehabilitation.  Dr. Fuad Aleskerov is Medical Director for [facility] Pulmonary Rehabilitation.

## 2024-05-23 DIAGNOSIS — Z01810 Encounter for preprocedural cardiovascular examination: Secondary | ICD-10-CM | POA: Diagnosis not present

## 2024-05-23 DIAGNOSIS — E119 Type 2 diabetes mellitus without complications: Secondary | ICD-10-CM | POA: Diagnosis not present

## 2024-05-23 DIAGNOSIS — E785 Hyperlipidemia, unspecified: Secondary | ICD-10-CM | POA: Diagnosis not present

## 2024-05-23 DIAGNOSIS — Z125 Encounter for screening for malignant neoplasm of prostate: Secondary | ICD-10-CM | POA: Diagnosis not present

## 2024-05-24 ENCOUNTER — Encounter

## 2024-05-24 ENCOUNTER — Encounter: Payer: Self-pay | Admitting: Surgery

## 2024-05-24 DIAGNOSIS — Z951 Presence of aortocoronary bypass graft: Secondary | ICD-10-CM | POA: Diagnosis not present

## 2024-05-24 NOTE — Progress Notes (Signed)
 Daily Session Note  Patient Details  Name: Marcus Huang MRN: [id number] Date of Birth: 1951/05/27 Referring Provider:   Flowsheet Row Cardiac Rehab from 01/20/2024 in [facility] Cardiac and Pulmonary Rehab  Referring Provider Dr. Marsa Dooms    Encounter Date: 05/24/2024  Check In:  Session Check In - 05/24/24 0936       Check-In   Supervising physician immediately available to respond to emergencies See telemetry face sheet for immediately available ER MD    Location [facility]-Cardiac & Pulmonary Rehab    Staff Present Burnard Davenport RN,BSN,MPA;Maxon Conetta BS, Exercise Physiologist;Margaret Best, MS, Exercise Physiologist;Jason Elnor RDN,LDN    Virtual Visit No    Medication changes reported     No    Fall or balance concerns reported    No    Tobacco Cessation No Change    Warm-up and Cool-down Performed on first and last piece of equipment    Resistance Training Performed Yes    VAD Patient? No    PAD/SET Patient? No      Pain Assessment   Currently in Pain? No/denies             Social History   Tobacco Use  Smoking Status Never  Smokeless Tobacco Never    Goals Met:  Independence with exercise equipment Exercise tolerated well No report of concerns or symptoms today Strength training completed today  Goals Unmet:  Not Applicable  Comments: Pt able to follow exercise prescription today without complaint.  Will continue to monitor for progression.    Dr. Oneil Pinal is Medical Director for [facility] Cardiac Rehabilitation.  Dr. Fuad Aleskerov is Medical Director for [facility] Pulmonary Rehabilitation.

## 2024-05-26 ENCOUNTER — Encounter: Admitting: Emergency Medicine

## 2024-05-26 VITALS — Ht 67.5 in | Wt 167.8 lb

## 2024-05-26 DIAGNOSIS — Z951 Presence of aortocoronary bypass graft: Secondary | ICD-10-CM | POA: Diagnosis not present

## 2024-05-26 NOTE — Progress Notes (Signed)
 Daily Session Note  Patient Details  Name: Marcus Huang MRN: [id number] Date of Birth: 1951/03/19 Referring Provider:   Flowsheet Row Cardiac Rehab from 01/20/2024 in [facility] Cardiac and Pulmonary Rehab  Referring Provider Dr. Marsa Dooms    Encounter Date: 05/26/2024  Check In:  Session Check In - 05/26/24 0917       Check-In   Supervising physician immediately available to respond to emergencies See telemetry face sheet for immediately available ER MD    Location [facility]-Cardiac & Pulmonary Rehab    Staff Present Devaughn Jaeger, BS, Exercise Physiologist;Maxon Conetta BS, Exercise Physiologist;Joseph Rolinda RCP,RRT,BSRT;Davey Bergsma RN,BSN    Virtual Visit No    Medication changes reported     No    Fall or balance concerns reported    No    Tobacco Cessation No Change    Warm-up and Cool-down Performed on first and last piece of equipment    Resistance Training Performed Yes    VAD Patient? No    PAD/SET Patient? No      Pain Assessment   Currently in Pain? No/denies             Social History   Tobacco Use  Smoking Status Never  Smokeless Tobacco Never    Goals Met:  Independence with exercise equipment Exercise tolerated well No report of concerns or symptoms today Strength training completed today  Goals Unmet:  Not Applicable  Comments: Pt able to follow exercise prescription today without complaint.  Will continue to monitor for progression.    Dr. Oneil Pinal is Medical Director for [facility] Cardiac Rehabilitation.  Dr. Fuad Aleskerov is Medical Director for [facility] Pulmonary Rehabilitation.

## 2024-05-26 NOTE — Patient Instructions (Signed)
 Discharge Patient Instructions  Patient Details  Name: Marcus Huang MRN: [id number] Date of Birth: [birth year] Referring Provider:  Jeffie Cheryl BRAVO, MD   Number of Visits: 39  Reason for Discharge:  Patient reached a stable level of exercise. Patient independent in their exercise. Patient has met program and personal goals.  Diagnosis:  S/P CABG x 3  Initial Exercise Prescription:  Initial Exercise Prescription - 01/20/24 0900       Date of Initial Exercise RX and Referring Provider   Date 01/20/24    Referring Provider Dr. Marsa Dooms      Oxygen   Maintain Oxygen Saturation 88% or higher      Treadmill   MPH 2.8    Grade 1    Minutes 15    METs 3.53      Elliptical   Level 1    Speed 3    Minutes 15    METs 3.32      REL-XR   Level 3    Speed 50    Minutes 15    METs 3.32      Prescription Details   Frequency (times per week) 2    Duration Progress to 30 minutes of continuous aerobic without signs/symptoms of physical distress      Intensity   THRR 40-80% of Max Heartrate 87-129    Ratings of Perceived Exertion 11-13    Perceived Dyspnea 0-4      Progression   Progression Continue to progress workloads to maintain intensity without signs/symptoms of physical distress.      Resistance Training   Training Prescription Yes    Weight 4 lb    Reps 10-15          Discharge Exercise Prescription (Final Exercise Prescription Changes):  Exercise Prescription Changes - 05/23/24 1500       Response to Exercise   Blood Pressure (Admit) 120/66    Blood Pressure (Exit) 118/66    Heart Rate (Admit) 62 bpm    Heart Rate (Exercise) 97 bpm    Heart Rate (Exit) 61 bpm    Rating of Perceived Exertion (Exercise) 13    Symptoms none    Duration Continue with 30 min of aerobic exercise without signs/symptoms of physical distress.    Intensity THRR unchanged      Progression   Progression Continue to progress workloads to maintain intensity  without signs/symptoms of physical distress.    Average METs 6.26      Resistance Training   Training Prescription Yes    Weight 10 lb    Reps 10-15      Interval Training   Interval Training No      Treadmill   MPH 3    Grade 8    Minutes 15    METs 6.61      Elliptical   Level 3    Speed 3.6    Minutes 15      Home Exercise Plan   Plans to continue exercise at Home (comment)   walking and hand weights.   Frequency Add 2 additional days to program exercise sessions.    Initial Home Exercises Provided 03/08/24      Oxygen   Maintain Oxygen Saturation 88% or higher          Functional Capacity:  6 Minute Walk     Row Name 01/20/24 0924 05/26/24 0928       6 Minute Walk   Phase Initial Discharge  Distance 1560 feet 1690 feet    Distance % Change -- 8.3 %    Distance Feet Change -- 130 ft    Walk Time 6 minutes 6 minutes    # of Rest Breaks 0 0    MPH 2.95 3.2    METS 3.32 3.51    RPE 8 10    Perceived Dyspnea  0 0    VO2 Peak 11.63 12.3    Symptoms No No    Resting HR 46 bpm 51 bpm    Resting BP 124/64 132/76    Resting Oxygen Saturation  100 % 96 %    Exercise Oxygen Saturation  during 6 min walk 99 % 93 %    Max Ex. HR 72 bpm 84 bpm    Max Ex. BP 148/66 138/68    2 Minute Post BP 140/70 --      Nutrition & Weight - Outcomes:  Pre Biometrics - 01/20/24 0923       Pre Biometrics   Height 5' 7.5 (1.715 m)    Weight 162 lb 4.8 oz (73.6 kg)    Waist Circumference 35 inches    Hip Circumference 37.5 inches    Waist to Hip Ratio 0.93 %    BMI (Calculated) 25.03    Single Leg Stand 30 seconds          Post Biometrics - 05/26/24 0929        Post  Biometrics   Height 5' 7.5 (1.715 m)    Weight 167 lb 12.8 oz (76.1 kg)    Waist Circumference 37 inches    Hip Circumference 39 inches    Waist to Hip Ratio 0.95 %    BMI (Calculated) 25.88    Single Leg Stand 24.9 seconds          Nutrition:  Nutrition Therapy & Goals - 01/20/24 0920        Nutrition Therapy   RD appointment deferred Yes      Intervention Plan   Intervention Prescribe, educate and counsel regarding individualized specific dietary modifications aiming towards targeted core components such as weight, hypertension, lipid management, diabetes, heart failure and other comorbidities.    Expected Outcomes Short Term Goal: Understand basic principles of dietary content, such as calories, fat, sodium, cholesterol and nutrients.;Short Term Goal: A plan has been developed with personal nutrition goals set during dietitian appointment.;Long Term Goal: Adherence to prescribed nutrition plan.

## 2024-05-27 NOTE — Therapy (Signed)
 OUTPATIENT OCCUPATIONAL THERAPY ORTHO EVALUATION  Patient Name: Marcus Huang MRN: [id number] DOB:10-23-1950, 73 y.o., male Today's Date: 05/30/2024  PCP: Dr Kip MART PROVIDER: Kip PA  END OF SESSION:  OT End of Session - 05/30/24 0819     Visit Number 1    Number of Visits 6    Date for OT Re-Evaluation 07/11/24    OT Start Time 0819    OT Stop Time 0900    OT Time Calculation (min) 41 min    Activity Tolerance Patient tolerated treatment well    Behavior During Therapy [facility] for tasks assessed/performed          Past Medical History:  Diagnosis Date   Carotid artery thrombosis    Left Carotid Artery   Coronary artery disease    Diabetes (HCC)    Diverticulosis    History of kidney stones    Hyperlipidemia    Hypertension    Renal mass    Sleep apnea    C-PAP   Past Surgical History:  Procedure Laterality Date   COLONOSCOPY WITH PROPOFOL  N/A 08/15/2019   Procedure: COLONOSCOPY WITH PROPOFOL ;  Surgeon: Jinny Carmine, MD;  Location: [facility];  Service: Endoscopy;  Laterality: N/A;  sleep apnea   CORONARY ARTERY BYPASS GRAFT N/A 12/10/2023   Procedure: CORONARY ARTERY BYPASS GRAFTING (CABG) xTHREE USING LEFT INTERNAL MAMMARY ARTERY AND ENDOSCOPICALLY HARVESTED RIGHT GREATER SAPPHENOUS VEIN;  Surgeon: Lucas Dorise POUR, MD;  Location: [facility] OR;  Service: Open Heart Surgery;  Laterality: N/A;   CORONARY STENT INTERVENTION N/A 08/05/2022   Procedure: CORONARY STENT INTERVENTION;  Surgeon: Ammon Blunt, MD;  Location: [facility] INVASIVE CV LAB;  Service: Cardiovascular;  Laterality: N/A;   CORONARY STENT INTERVENTION Left 08/28/2022   Procedure: CORONARY STENT INTERVENTION;  Surgeon: Ammon Blunt, MD;  Location: [facility] INVASIVE CV LAB;  Service: Cardiovascular;  Laterality: Left;   EYE SURGERY Bilateral    LASIK EYE SURGERY   INCISION AND DRAINAGE OF WOUND Right 05/05/2024   Procedure: IRRIGATION AND DEBRIDEMENT WOUND RIGHT INDEX FINGER, EXPLORATION  WITH IRRIGATION AND DEBRIDEMENT OF FLEXOR SHEATH WITH RELEASE OF A-1 PULLEY;  Surgeon: Edie Norleen PARAS, MD;  Location: [facility] ORS;  Service: Orthopedics;  Laterality: Right;  flexor sheath right index   INGUINAL HERNIA REPAIR Bilateral 05/12/2017   Procedure: LAPAROSCOPIC BILATERAL INGUINAL HERNIA REPAIR, umbilical hernia repair;  Surgeon: Wonda Charlie BRAVO, MD;  Location: [facility] ORS;  Service: General;  Laterality: Bilateral;   INTRAOPERATIVE TRANSESOPHAGEAL ECHOCARDIOGRAM N/A 12/10/2023   Procedure: ECHOCARDIOGRAM, TRANSESOPHAGEAL, INTRAOPERATIVE;  Surgeon: Lucas Dorise POUR, MD;  Location: [facility] OR;  Service: Open Heart Surgery;  Laterality: N/A;   LEFT HEART CATH AND CORONARY ANGIOGRAPHY N/A 08/05/2022   Procedure: LEFT HEART CATH AND CORONARY ANGIOGRAPHY;  Surgeon: Ammon Blunt, MD;  Location: [facility] INVASIVE CV LAB;  Service: Cardiovascular;  Laterality: N/A;   LEFT HEART CATH AND CORONARY ANGIOGRAPHY Left 11/11/2023   Procedure: LEFT HEART CATH AND CORONARY ANGIOGRAPHY;  Surgeon: Ammon Blunt, MD;  Location: [facility] INVASIVE CV LAB;  Service: Cardiovascular;  Laterality: Left;   TONSILLECTOMY     As a child   Patient Active Problem List   Diagnosis Date Noted   Cellulitis of finger of right hand 05/05/2024   Cellulitis 05/05/2024   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/12/2022   Lymphedema 08/24/2022   Raynaud's disease 08/24/2022   PAD (peripheral artery disease) (HCC) 08/24/2022   S/P drug eluting coronary  stent placement 08/15/2022   CAD (coronary artery disease) 08/05/2022   Screen for colon cancer 03/14/2020   Special screening for malignant neoplasms, colon    Peyronie's disease 07/16/2019   Renal mass 07/10/2019   Heart palpitations 06/27/2019   Carotid artery stenosis 06/02/2019   Umbilical hernia without obstruction and without gangrene 06/02/2019   Aortic atherosclerosis (HCC) 06/02/2019    Hypertensive urgency 05/31/2019   Non-recurrent bilateral inguinal hernia without obstruction or gangrene    Diabetes type 2, uncontrolled 02/14/2014   Hyperlipidemia, unspecified 02/14/2014   Malignant hypertension 02/14/2014   Type 2 diabetes mellitus without complication, without long-term current use of insulin  (HCC) 02/14/2014   Hyperlipidemia 02/14/2014   Hypertension, essential, benign 02/14/2014    ONSET DATE: 05/05/24  REFERRING DIAG: R hand cellulitis with I&D 2nd digit and palm   THERAPY DIAG:  Stiffness of right hand, not elsewhere classified  Scar tissue  Pain in right hand  Muscle weakness (generalized)  Rationale for Evaluation and Treatment: Rehabilitation  SUBJECTIVE:   SUBJECTIVE STATEMENT: My finger is much better than what it was but still tight and stiff.  The doctor showed me some scar massage.  It still tender over the scars and when I pinch on my index finger Pt accompanied by: self  PERTINENT HISTORY: 05/17/24 Ortho not :  The patient presents today with a dressing applied to the right hand. Dressing was removed at today's visit. Skin examination of the right hand demonstrates a well-healing incision at the base of the finger on where the entire A1 pulley was released. Sutures are intact without any evidence of loosening. The sutures were removed today without complication. Benzoin and Steri-Strips were applied. The patient has a well-healed stab type incision along the radial aspect of the distal phalanx without any erythema. The patient does still have some mild swelling throughout the finger however wrinkles are beginning to appear. He denies any significant tenderness to palpation along the volar or dorsal aspect of the finger at this time. He is able to fully extend the finger, limitations with flexion due to the continued swelling but no pain with passive flexion or extension of the finger. There is no purulent drainage coming from either incision site. He  is intact to light touch, he does report some slight loss of sensation along the distal aspect of the radial portion of the distal phalanx. Cap refills intact to each individual digit. Radial or pulse are intact at today's visit. A Band-Aid was applied over the stab incision along the radial aspect of this callus.  Imaging: None.  Impression: Suppurative tenosynovitis of flexor tendon of right hand [M65.141] Suppurative tenosynovitis of flexor tendon of right hand (primary encounter diagnosis) Infected foreign body of finger of right hand, sequela Felon of finger of right hand with lymphangitis  Plan:  1. Treatment options were discussed today with the patient. 2. Continue with doxycycline as prescribed. 3. The patient was instructed that he may begin to get the finger wet over the A1 pulley incision site. Sutures were removed at today's visit and benzoin and Steri-Strips were applied. He was instructed to keep the stab incision dry until Friday. 4. Begin to massage to the index finger and work on continued range of motion. Gradually increase activities as tolerated. Referral for occupational therapy was placed with the patient. 5. The patient will follow up with me in 2 weeks for repeat skin check of the right hand. 6. They can call the clinic they have any questions, new  symptoms develop or symptoms worsen.   PRECAUTIONS: None     WEIGHT BEARING RESTRICTIONS: No  PAIN:  Are you having pain? 5/10 lateral DIP and scar palm   FALLS: Has patient fallen in last 6 months? No  LIVING ENVIRONMENT: Lives with: Live with wife    PLOF: Had normal active range of motion and strength and right dominant hand.  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  PATIENT GOALS: Want the pain and scar tissue better as well as more motion to return to normal activity  NEXT MD VISIT: This week   OBJECTIVE:  Note: Objective  measures were completed at Evaluation unless otherwise noted.  HAND DOMINANCE: Left  ADLs: Cannot grip tight objects as well as pinching  FUNCTIONAL OUTCOME MEASURES: Next session  UPPER EXTREMITY ROM:     Active ROM Right eval Left eval  Shoulder flexion    Shoulder abduction    Shoulder adduction    Shoulder extension    Shoulder internal rotation    Shoulder external rotation    Elbow flexion    Elbow extension    Wrist flexion    Wrist extension    Wrist ulnar deviation    Wrist radial deviation    Wrist pronation    Wrist supination    (Blank rows = not tested)  Active ROM Right eval Left eval  Thumb MCP (0-60)    Thumb IP (0-80)    Thumb Radial abd/add (0-55)     Thumb Palmar abd/add (0-45)     Thumb Opposition to Small Finger     Index MCP (0-90) 80  85  Index PIP (0-100) 90  100  Index DIP (0-70)  60  75  Long MCP (0-90)      Long PIP (0-100)      Long DIP (0-70)      Ring MCP (0-90)      Ring PIP (0-100)      Ring DIP (0-70)      Little MCP (0-90)      Little PIP (0-100)      Little DIP (0-70)      (Blank rows = not tested)  )  HAND FUNCTION: Grip strength: Right:   lbs; Left:   lbs, Lateral pinch: Right: 21 lbs, Left: 27 lbs, and 3 point pinch: Right: 11 lbs, Left: 21 lbs 2 point pinch R 7 lbs and L 16 lbs   COORDINATION: Decrease - tenderness at DIP of 2nd with pinching   SENSATION: Some numbness around incision at lateral DIP   EDEMA: proximal phalanges-R 6.9 cm and L 6.5 cm  COGNITION: Overall cognitive status: Within functional limits for tasks assessed      TREATMENT DATE: 05/30/24  Modalities: Fluidotherapy:  Time: Location: R wrist and hand  Decrease stiffness and pain prior to review of home exercises  Patient do contrast to decrease edema and pain and stiffness prior to home exercises to 3  times a day Handout provided and reviewed Scar massage patient educated on scar mobilization on palmar scar as well as DIP Fitted with Cica -Care scar pad to use at nighttime Silicone compression sleeve for digit at nighttime  Followed by [facility] flexion with PIP extension 10 reps Gentle passive range of motion to DIP/PIP flexion 10 reps Followed by blocked intrinsic a fist 10 reps Followed by composite flexion 10 reps         PATIENT EDUCATION: Education details: findings of eval and HEP  Person educated: Patient Education method: Explanation, Demonstration, Tactile cues, Verbal cues, and Handouts Education comprehension: verbalized understanding, returned demonstration, verbal cues required, and needs further education   GOALS: Goals reviewed with patient? Yes   LONG TERM GOALS: Target date: 6 wks  Patient be independent in home program to decrease scar tissue and tenderness and pain for patient to tolerate pressure with lateral pinch and 2 point pinch Baseline: 5/10 pain and tenderness over the scar with massage as well as lateral and precision pinch-scar tender and thick Goal status: INITIAL  2.  Right second digit flexion improved to within normal range compared to the left hand symptom-free for patient to play guitar type shoes do buttons Baseline: Patient has MP flexion at 80 PIP 90 and DIP 60 degrees with increased discomfort and tightness.  With pressure use pain can increase to 5/10 Goal status: INITIAL  3.  Right 3-point pinch and 2-point pinch improved to within normal range for patient to do buttons, play guitar open Ziploc bags or packages symptom-free Baseline: 3-point pinch right 11 pounds and left 21 pounds, 2-point pinch right 7 pounds left 16 pounds with discomfort increased to 5/10 Goal status: INITIAL  ASSESSMENT:  CLINICAL IMPRESSION: Patient seen today for occupational therapy evaluation for right second digit cellulitis with the irrigation and  debridement on 05/05/2024 by Dr. Edie -patient with increased stiffness as well as increase scar tissue and tenderness that can increase to 5/10 especially with pressure on the DIP scar during 3 point and 2 point precision pinch.  Tenderness of the scar as well as thick scar tissue over the A1 pulley of the second and lateral DIP of second.  Patient is left-hand dominant.  But scar tissue with increased pain and stiffness limiting patient's functional use especially in small task like buttons small opening small packages, playing guitar, cutting with utensils.  Patient can benefit from skilled OT services to decrease edema ,scar tissue and pain and increase motion and strength to return to prior level of function.  PERFORMANCE DEFICITS: in functional skills including ADLs, IADLs, ROM, strength, pain, flexibility, decreased knowledge of use of DME, and UE functional use,   and psychosocial skills including environmental adaptation and routines and behaviors.   IMPAIRMENTS: are limiting patient from ADLs, IADLs, rest and sleep, play, leisure, and social participation.   COMORBIDITIES: has no other co-morbidities that affects occupational performance. Patient will benefit from skilled OT to address above impairments and improve overall function.  MODIFICATION OR ASSISTANCE TO COMPLETE EVALUATION: No modification of tasks or assist necessary to complete an evaluation.  OT OCCUPATIONAL PROFILE AND HISTORY: Problem focused assessment: Including review of records relating to presenting problem.  CLINICAL DECISION MAKING: LOW - limited treatment options, no task modification necessary  REHAB POTENTIAL: Good for goals  EVALUATION COMPLEXITY: Low    PLAN:  OT FREQUENCY: 1x/week  OT DURATION: 6 weeks  PLANNED INTERVENTIONS: 97168 OT Re-evaluation, 97535 self care/ADL training, 02889 therapeutic exercise, 97530 therapeutic activity, 97112 neuromuscular re-education, 97140 manual therapy, 97035  ultrasound, 97018 paraffin, 02960 fluidotherapy, 97034 contrast bath, 97760 Orthotic Initial, [id number] Orthotic/Prosthetic subsequent, scar mobilization, passive range of motion, patient/family education, and DME and/or AE instructions    CONSULTED AND AGREED WITH PLAN OF CARE: Patient     Ancel Peters, OTR/L,CLT 05/30/2024, 1:46 PM

## 2024-05-30 ENCOUNTER — Encounter: Payer: Self-pay | Admitting: Occupational Therapy

## 2024-05-30 ENCOUNTER — Ambulatory Visit: Attending: Student | Admitting: Occupational Therapy

## 2024-05-30 DIAGNOSIS — M25641 Stiffness of right hand, not elsewhere classified: Secondary | ICD-10-CM | POA: Diagnosis not present

## 2024-05-30 DIAGNOSIS — L905 Scar conditions and fibrosis of skin: Secondary | ICD-10-CM | POA: Diagnosis not present

## 2024-05-30 DIAGNOSIS — M6281 Muscle weakness (generalized): Secondary | ICD-10-CM | POA: Insufficient documentation

## 2024-05-30 DIAGNOSIS — M79641 Pain in right hand: Secondary | ICD-10-CM | POA: Insufficient documentation

## 2024-05-31 ENCOUNTER — Encounter

## 2024-05-31 DIAGNOSIS — Z951 Presence of aortocoronary bypass graft: Secondary | ICD-10-CM

## 2024-05-31 NOTE — Progress Notes (Signed)
 Daily Session Note  Patient Details  Name: MACLOVIO HENSON MRN: [id number] Date of Birth: 10/09/1950 Referring Provider:   Flowsheet Row Cardiac Rehab from 01/20/2024 in [facility] Cardiac and Pulmonary Rehab  Referring Provider Dr. Marsa Dooms    Encounter Date: 05/31/2024  Check In:  Session Check In - 05/31/24 0949       Check-In   Supervising physician immediately available to respond to emergencies See telemetry face sheet for immediately available ER MD    Location [facility]-Cardiac & Pulmonary Rehab    Staff Present Burnard Davenport [facility] Best, MS, Exercise Physiologist;Laureen Delores, BS, RRT, CPFT;Jason Elnor RDN,LDN    Virtual Visit No    Medication changes reported     No    Fall or balance concerns reported    No    Tobacco Cessation No Change    Warm-up and Cool-down Performed on first and last piece of equipment    Resistance Training Performed Yes    VAD Patient? No    PAD/SET Patient? No      Pain Assessment   Currently in Pain? No/denies             Social History   Tobacco Use  Smoking Status Never  Smokeless Tobacco Never    Goals Met:  Independence with exercise equipment Exercise tolerated well No report of concerns or symptoms today Strength training completed today  Goals Unmet:  Not Applicable  Comments: Pt able to follow exercise prescription today without complaint.  Will continue to monitor for progression.    Dr. Oneil Pinal is Medical Director for [facility] Cardiac Rehabilitation.  Dr. Fuad Aleskerov is Medical Director for [facility] Pulmonary Rehabilitation.

## 2024-06-02 ENCOUNTER — Ambulatory Visit: Admitting: Occupational Therapy

## 2024-06-02 ENCOUNTER — Encounter

## 2024-06-02 DIAGNOSIS — I10 Essential (primary) hypertension: Secondary | ICD-10-CM | POA: Diagnosis not present

## 2024-06-02 DIAGNOSIS — F418 Other specified anxiety disorders: Secondary | ICD-10-CM | POA: Diagnosis not present

## 2024-06-02 DIAGNOSIS — I700 Atherosclerosis of aorta: Secondary | ICD-10-CM | POA: Diagnosis not present

## 2024-06-02 DIAGNOSIS — T466X5A Adverse effect of antihyperlipidemic and antiarteriosclerotic drugs, initial encounter: Secondary | ICD-10-CM | POA: Diagnosis not present

## 2024-06-02 DIAGNOSIS — Z0000 Encounter for general adult medical examination without abnormal findings: Secondary | ICD-10-CM | POA: Diagnosis not present

## 2024-06-02 DIAGNOSIS — G8929 Other chronic pain: Secondary | ICD-10-CM | POA: Diagnosis not present

## 2024-06-02 DIAGNOSIS — M791 Myalgia, unspecified site: Secondary | ICD-10-CM | POA: Diagnosis not present

## 2024-06-02 DIAGNOSIS — E119 Type 2 diabetes mellitus without complications: Secondary | ICD-10-CM | POA: Diagnosis not present

## 2024-06-02 DIAGNOSIS — G4733 Obstructive sleep apnea (adult) (pediatric): Secondary | ICD-10-CM | POA: Diagnosis not present

## 2024-06-02 DIAGNOSIS — E785 Hyperlipidemia, unspecified: Secondary | ICD-10-CM | POA: Diagnosis not present

## 2024-06-02 DIAGNOSIS — M5442 Lumbago with sciatica, left side: Secondary | ICD-10-CM | POA: Diagnosis not present

## 2024-06-02 DIAGNOSIS — I25119 Atherosclerotic heart disease of native coronary artery with unspecified angina pectoris: Secondary | ICD-10-CM | POA: Diagnosis not present

## 2024-06-06 ENCOUNTER — Encounter: Payer: Self-pay | Admitting: Surgery

## 2024-06-06 ENCOUNTER — Ambulatory Visit: Admitting: Occupational Therapy

## 2024-06-06 DIAGNOSIS — M79641 Pain in right hand: Secondary | ICD-10-CM

## 2024-06-06 DIAGNOSIS — M6281 Muscle weakness (generalized): Secondary | ICD-10-CM

## 2024-06-06 DIAGNOSIS — M25641 Stiffness of right hand, not elsewhere classified: Secondary | ICD-10-CM

## 2024-06-06 DIAGNOSIS — L905 Scar conditions and fibrosis of skin: Secondary | ICD-10-CM

## 2024-06-06 NOTE — Therapy (Signed)
 OUTPATIENT OCCUPATIONAL THERAPY ORTHO TREATMENT  Patient Name: Marcus Huang MRN: [id number] DOB:04-11-1951, 73 y.o., male Today's Date: 06/06/2024  PCP: Dr Kip MART PROVIDER: Kip PA  END OF SESSION:  OT End of Session - 06/06/24 1035     Visit Number 2    Number of Visits 6    Date for Recertification  07/11/24    OT Start Time 1035    OT Stop Time 1108    OT Time Calculation (min) 33 min    Activity Tolerance Patient tolerated treatment well    Behavior During Therapy [facility] for tasks assessed/performed          Past Medical History:  Diagnosis Date   Carotid artery thrombosis    Left Carotid Artery   Coronary artery disease    Diabetes (HCC)    Diverticulosis    History of kidney stones    Hyperlipidemia    Hypertension    Renal mass    Sleep apnea    C-PAP   Past Surgical History:  Procedure Laterality Date   COLONOSCOPY WITH PROPOFOL  N/A 08/15/2019   Procedure: COLONOSCOPY WITH PROPOFOL ;  Surgeon: Jinny Carmine, MD;  Location: [facility];  Service: Endoscopy;  Laterality: N/A;  sleep apnea   CORONARY ARTERY BYPASS GRAFT N/A 12/10/2023   Procedure: CORONARY ARTERY BYPASS GRAFTING (CABG) xTHREE USING LEFT INTERNAL MAMMARY ARTERY AND ENDOSCOPICALLY HARVESTED RIGHT GREATER SAPPHENOUS VEIN;  Surgeon: Lucas Dorise POUR, MD;  Location: [facility];  Service: Open Heart Surgery;  Laterality: N/A;   CORONARY STENT INTERVENTION N/A 08/05/2022   Procedure: CORONARY STENT INTERVENTION;  Surgeon: Ammon Blunt, MD;  Location: [facility] INV[facility] CV [facility];  Service: Cardiovascular;  Laterality: N/A;   CORONARY STENT INTERVENTION Left 08/28/2022   Procedure: CORONARY STENT INTERVENTION;  Surgeon: Ammon Blunt, MD;  Location: [facility];  Service: Cardiovascular;  Laterality: Left;   EYE SURGERY Bilateral    LASIK EYE SURGERY   INCISION AND DRAINAGE OF WOUND Right 05/05/2024   Procedure: IRRIGATION AND DEBRIDEMENT WOUND RIGHT INDEX FINGER, EXPLORATION  WITH IRRIGATION AND DEBRIDEMENT OF FLEXOR SHEATH WITH RELEASE OF A-1 PULLEY;  Surgeon: Edie Norleen PARAS, MD;  Location: [facility] ORS;  Service: Orthopedics;  Laterality: Right;  flexor sheath right index   INGUINAL HERNIA REPAIR Bilateral 05/12/2017   Procedure: LAPAROSCOPIC BILATERAL INGUINAL HERNIA REPAIR, umbilical hernia repair;  Surgeon: Wonda Charlie BRAVO, MD;  Location: [facility] ORS;  Service: General;  Laterality: Bilateral;   INTRAOPERATIVE TRANSESOPHAGEAL ECHOCARDIOGRAM N/A 12/10/2023   Procedure: ECHOCARDIOGRAM, TRANSESOPHAGEAL, INTRAOPERATIVE;  Surgeon: Lucas Dorise POUR, MD;  Location: [facility];  Service: Open Heart Surgery;  Laterality: N/A;   LEFT HEART CATH AND CORONARY ANGIOGRAPHY N/A 08/05/2022   Procedure: LEFT HEART CATH AND CORONARY ANGIOGRAPHY;  Surgeon: Ammon Blunt, MD;  Location: [facility];  Service: Cardiovascular;  Laterality: N/A;   LEFT HEART CATH AND CORONARY ANGIOGRAPHY Left 11/11/2023   Procedure: LEFT HEART CATH AND CORONARY ANGIOGRAPHY;  Surgeon: Ammon Blunt, MD;  Location: [facility] CV LAB;  Service: Cardiovascular;  Laterality: Left;   TONSILLECTOMY     As a child   Patient Active Problem List   Diagnosis Date Noted   Cellulitis of finger of right hand 05/05/2024   Cellulitis 05/05/2024   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/12/2022   Lymphedema 08/24/2022   Raynaud's disease 08/24/2022   PAD (peripheral artery disease) (HCC) 08/24/2022   S/P drug eluting coronary  stent placement 08/15/2022   CAD (coronary artery disease) 08/05/2022   Screen for colon cancer 03/14/2020   Special screening for malignant neoplasms, colon    Peyronie's disease 07/16/2019   Renal mass 07/10/2019   Heart palpitations 06/27/2019   Carotid artery stenosis 06/02/2019   Umbilical hernia without obstruction and without gangrene 06/02/2019   Aortic atherosclerosis (HCC) 06/02/2019    Hypertensive urgency 05/31/2019   Non-recurrent bilateral inguinal hernia without obstruction or gangrene    Diabetes type 2, uncontrolled 02/14/2014   Hyperlipidemia, unspecified 02/14/2014   Malignant hypertension 02/14/2014   Type 2 diabetes mellitus without complication, without long-term current use of insulin  (HCC) 02/14/2014   Hyperlipidemia 02/14/2014   Hypertension, essential, benign 02/14/2014    ONSET DATE: 05/05/24  REFERRING DIAG: R hand cellulitis with I&D 2nd digit and palm   THERAPY DIAG:  Stiffness of right hand, not elsewhere classified  Scar tissue  Pain in right hand  Muscle weakness (generalized)  Rationale for Evaluation and Treatment: Rehabilitation  SUBJECTIVE:   SUBJECTIVE STATEMENT: I can tell the scar she is doing all better in my motion.  But I still cannot usually put pressure on the tendon like to play guitar.  It is very weak and tender. Pt accompanied by: self  PERTINENT HISTORY: 05/17/24 Ortho not :  The patient presents today with a dressing applied to the right hand. Dressing was removed at today's visit. Skin examination of the right hand demonstrates a well-healing incision at the base of the finger on where the entire A1 pulley was released. Sutures are intact without any evidence of loosening. The sutures were removed today without complication. Benzoin and Steri-Strips were applied. The patient has a well-healed stab type incision along the radial aspect of the distal phalanx without any erythema. The patient does still have some mild swelling throughout the finger however wrinkles are beginning to appear. He denies any significant tenderness to palpation along the volar or dorsal aspect of the finger at this time. He is able to fully extend the finger, limitations with flexion due to the continued swelling but no pain with passive flexion or extension of the finger. There is no purulent drainage coming from either incision site. He is intact to  light touch, he does report some slight loss of sensation along the distal aspect of the radial portion of the distal phalanx. Cap refills intact to each individual digit. Radial or pulse are intact at today's visit. A Band-Aid was applied over the stab incision along the radial aspect of this callus.  Imaging: None.  Impression: Suppurative tenosynovitis of flexor tendon of right hand [M65.141] Suppurative tenosynovitis of flexor tendon of right hand (primary encounter diagnosis) Infected foreign body of finger of right hand, sequela Felon of finger of right hand with lymphangitis  Plan:  1. Treatment options were discussed today with the patient. 2. Continue with doxycycline as prescribed. 3. The patient was instructed that he may begin to get the finger wet over the A1 pulley incision site. Sutures were removed at today's visit and benzoin and Steri-Strips were applied. He was instructed to keep the stab incision dry until Friday. 4. Begin to massage to the index finger and work on continued range of motion. Gradually increase activities as tolerated. Referral for occupational therapy was placed with the patient. 5. The patient will follow up with me in 2 weeks for repeat skin check of the right hand. 6. They can call the clinic they have any questions, new symptoms develop  or symptoms worsen.   PRECAUTIONS: None     WEIGHT BEARING RESTRICTIONS: No  PAIN:  Are you having pain?  Scar at the DIP of the second digit more weakness and tender.  Pain  FALLS: Has patient fallen in last 6 months? No  LIVING ENVIRONMENT: Lives with: Live with wife    PLOF: Had normal active range of motion and strength and right dominant hand.  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  PATIENT GOALS: Want the pain and scar tissue better as well as more motion to return to normal activity  NEXT MD VISIT: This week   OBJECTIVE:   Note: Objective measures were completed at Evaluation unless otherwise noted.  HAND DOMINANCE: Left  ADLs: Cannot grip tight objects as well as pinching  FUNCTIONAL OUTCOME MEASURES: Next session  UPPER EXTREMITY ROM:     Active ROM Right eval Left eval  Shoulder flexion    Shoulder abduction    Shoulder adduction    Shoulder extension    Shoulder internal rotation    Shoulder external rotation    Elbow flexion    Elbow extension    Wrist flexion    Wrist extension    Wrist ulnar deviation    Wrist radial deviation    Wrist pronation    Wrist supination    (Blank rows = not tested)  Active ROM Right eval Left eval R 05/06/24  Thumb MCP (0-60)     Thumb IP (0-80)     Thumb Radial abd/add (0-55)      Thumb Palmar abd/add (0-45)      Thumb Opposition to Small Finger      Index MCP (0-90) 80  85 80  Index PIP (0-100) 90  100 95  Index DIP (0-70)  60  75 70  Long MCP (0-90)       Long PIP (0-100)       Long DIP (0-70)       Ring MCP (0-90)       Ring PIP (0-100)       Ring DIP (0-70)       Little MCP (0-90)       Little PIP (0-100)       Little DIP (0-70)       (Blank rows = not tested)  )  HAND FUNCTION: Grip strength: Right:   lbs; Left:   lbs, Lateral pinch: Right: 21 lbs, Left: 27 lbs, and 3 point pinch: Right: 11 lbs, Left: 21 lbs 2 point pinch R 7 lbs and L 16 lbs  06/06/24 Grip strength: Right:   lbs; Left:   lbs, Lateral pinch: Right: 21 lbs, Left: 27 lbs, and 3 point pinch: Right: 16 lbs, Left: 21 lbs; 2 point pinch R 11 lbs and L 16 lbs  COORDINATION: Decrease - tenderness at DIP of 2nd with pinching   SENSATION: Some numbness around incision at lateral DIP   EDEMA: proximal phalanges-R 6.9 cm and L 6.5 cm  COGNITION: Overall cognitive status: Within functional limits for tasks assessed      TREATMENT DATE: 06/06/24  Patient arrives with increased motion in right second digit as well as increased 3 and2 point pinch.  Scar is improving but still tender and adhere over A1 pulley as well as DIP Report weakness in attempt of 3 point and 2 point pinch and precision pinch  Modalities: Fluidotherapy:  Time: Location: R wrist and hand  Decrease stiffness and tenderness prior to soft tissue massage and scar massage and motion  Patient do continue with contrast to decrease edema and pain and stiffness prior to home exercises to 3 times a day Handout provided and reviewed Thumb scar massage using Graston tool #6 for sweeping over volar second digit as well as brushing around volar scar.   Done mini massager over Cica -Care scar pad at both scars tolerating well with great response to scar adhesions  Reviewed with patient again scar massage -done educated on scar mobilization on palmar scar as well as DIP at evaluation and handout provided Provided patient with a new  Cica -Care scar pad to use at nighttime and for scar massage As well as new silicone compression sleeve for digit at nighttime  Followed by [facility] flexion with PIP extension 10 reps Gentle passive range of motion to DIP/PIP flexion 10 reps Followed by blocked intrinsic a fist 10 reps Followed by composite flexion 10 reps  Medium teal putty provided for patient reviewed with patient gripping as well as lateral pinch and 3-point pinch and precision pinch.  Tolerated well.  12-15 reps each twice a day.  Patient can increase in 3 days if pain-free to a second set and over the weekend to a third set pain-free. Reinforced with patient to not overdo putting keep it at twice a day. Tolerated well        PATIENT EDUCATION: Education details: findings of eval and HEP  Person educated: Patient Education method: Explanation, Demonstration, Tactile cues, Verbal cues, and Handouts Education comprehension: verbalized understanding, returned  demonstration, verbal cues required, and needs further education   GOALS: Goals reviewed with patient? Yes   LONG TERM GOALS: Target date: 6 wks  Patient be independent in home program to decrease scar tissue and tenderness and pain for patient to tolerate pressure with lateral pinch and 2 point pinch Baseline: 5/10 pain and tenderness over the scar with massage as well as lateral and precision pinch-scar tender and thick Goal status: INITIAL  2.  Right second digit flexion improved to within normal range compared to the left hand symptom-free for patient to play guitar type shoes do buttons Baseline: Patient has MP flexion at 80 PIP 90 and DIP 60 degrees with increased discomfort and tightness.  With pressure use pain can increase to 5/10 Goal status: INITIAL  3.  Right 3-point pinch and 2-point pinch improved to within normal range for patient to do buttons, play guitar open Ziploc bags or packages symptom-free Baseline: 3-point pinch right 11 pounds and left 21 pounds, 2-point pinch right 7 pounds left 16 pounds with discomfort increased to 5/10 Goal status: INITIAL  ASSESSMENT:  CLINICAL IMPRESSION: Patient seen today for occupational therapy evaluation for right second digit cellulitis with the irrigation and debridement on 05/05/2024 by Dr. Edie -patient with increased stiffness as well as increase scar tissue and tenderness that can increase to 5/10 especially with pressure on the DIP scar during 3 point and 2 point precision pinch.  Tenderness of the scar as well as thick scar tissue over the A1 pulley of the second and lateral DIP of second.  Patient is left-hand dominant.  NOW patient arrived with increased range of motion in the right second digit as well as lateral 3-point pinch.  Was able to upgrade patient to an active medium putty for strengthening.  Patient to continue with scar mobilization.  But scar tissue with increased pain and stiffness limiting patient's functional use  especially in small task like buttons small opening small packages, playing guitar, cutting with utensils.  Patient can benefit from skilled OT services to decrease edema ,scar tissue and pain and increase motion and strength to return to prior level of function.  PERFORMANCE DEFICITS: in functional skills including ADLs, IADLs, ROM, strength, pain, flexibility, decreased knowledge of use of DME, and UE functional use,   and psychosocial skills including environmental adaptation and routines and behaviors.   IMPAIRMENTS: are limiting patient from ADLs, IADLs, rest and sleep, play, leisure, and social participation.   COMORBIDITIES: has no other co-morbidities that affects occupational performance. Patient will benefit from skilled OT to address above impairments and improve overall function.  MODIFICATION OR ASSISTANCE TO COMPLETE EVALUATION: No modification of tasks or assist necessary to complete an evaluation.  OT OCCUPATIONAL PROFILE AND HISTORY: Problem focused assessment: Including review of records relating to presenting problem.  CLINICAL DECISION MAKING: LOW - limited treatment options, no task modification necessary  REHAB POTENTIAL: Good for goals  EVALUATION COMPLEXITY: Low    PLAN:  OT FREQUENCY: 1x/week  OT DURATION: 6 weeks  PLANNED INTERVENTIONS: 97168 OT Re-evaluation, 97535 self care/ADL training, 02889 therapeutic exercise, 97530 therapeutic activity, 97112 neuromuscular re-education, 97140 manual therapy, 97035 ultrasound, 97018 paraffin, 02960 fluidotherapy, 97034 contrast bath, 97760 Orthotic Initial, [id number] Orthotic/Prosthetic subsequent, scar mobilization, passive range of motion, patient/family education, and DME and/or AE instructions    CONSULTED AND AGREED WITH PLAN OF CARE: Patient     Ancel Peters, OTR/L,CLT 06/06/2024, 11:10 AM

## 2024-06-07 ENCOUNTER — Encounter

## 2024-06-07 DIAGNOSIS — Z951 Presence of aortocoronary bypass graft: Secondary | ICD-10-CM | POA: Diagnosis not present

## 2024-06-07 NOTE — Progress Notes (Signed)
 Daily Session Note  Patient Details  Name: Marcus Huang MRN: [id number] Date of Birth: [birth year] Referring Provider:   Flowsheet Row Cardiac Rehab from 01/20/2024 in [facility] Cardiac and Pulmonary Rehab  Referring Provider Dr. Marsa Dooms    Encounter Date: 06/07/2024  Check In:  Session Check In - 06/07/24 0914       Check-In   Supervising physician immediately available to respond to emergencies See telemetry face sheet for immediately available ER MD    Location [facility]-Cardiac & Pulmonary Rehab    Staff Present Burnard Davenport [facility] Peggi, RN, DNP, NE-BC;Maxon Burnell BS, Exercise Physiologist;Margaret Best, MS, Exercise Physiologist    Virtual Visit No    Medication changes reported     No    Fall or balance concerns reported    No    Tobacco Cessation No Change    Warm-up and Cool-down Performed on first and last piece of equipment    Resistance Training Performed Yes    VAD Patient? No    PAD/SET Patient? No      Pain Assessment   Currently in Pain? No/denies             Social History   Tobacco Use  Smoking Status Never  Smokeless Tobacco Never    Goals Met:  Proper associated with RPD/PD & O2 Sat Independence with exercise equipment Exercise tolerated well No report of concerns or symptoms today Strength training completed today  Goals Unmet:  Not Applicable  Comments: Pt able to follow exercise prescription today without complaint.  Will continue to monitor for progression.    Dr. Oneil Pinal is Medical Director for [facility] Cardiac Rehabilitation.  Dr. Fuad Aleskerov is Medical Director for [facility] Pulmonary Rehabilitation.

## 2024-06-08 DIAGNOSIS — Z951 Presence of aortocoronary bypass graft: Secondary | ICD-10-CM

## 2024-06-08 DIAGNOSIS — Z955 Presence of coronary angioplasty implant and graft: Secondary | ICD-10-CM

## 2024-06-08 NOTE — Progress Notes (Signed)
 Cardiac Individual Treatment Plan  Patient Details  Name: Marcus Huang MRN: [id number] Date of Birth: [birth year] Referring Provider:   Flowsheet Row Cardiac Rehab from 01/20/2024 in [facility] Cardiac and Pulmonary Rehab  Referring Provider Dr. 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 (Patient not taking: Reported on 05/05/2024), Disp: , Rfl:    amiodarone  (PACERONE ) 200 MG tablet, 200 mg., Disp: , Rfl:    amLODipine  (NORVASC ) 10 MG tablet, Take 1 tablet by mouth daily., Disp: , Rfl:    aspirin  EC 81 MG tablet, Take 81 mg by mouth daily. Swallow whole., Disp: , Rfl:    chlorhexidine  (HIBICLENS ) 4 % external liquid, Apply 15 mLs (1 Application total) topically as directed for 30 doses. Use as directed daily for 5 days every other week for 6 weeks., Disp: 946 mL, Rfl: 1   Continuous Glucose Sensor (FREESTYLE LIBRE 3 PLUS SENSOR) MISC, FOLLOW PACKAGE DIRECTIONS CHANGE EVERY 15 DAYS, Disp: , Rfl:    lisinopril  (ZESTRIL ) 5 MG tablet, Take 1 tablet (5 mg total) by mouth daily., Disp: 30 tablet, Rfl: 1   metoprolol  tartrate (LOPRESSOR ) 50 MG tablet, Take 1 tablet (50 mg total) by mouth 2 (two) times daily. (Patient taking differently: Take 25 mg by mouth 2 (two) times daily. Take half in the morning and half in the evening), Disp: 60 tablet, Rfl: 1   Omega-3 Fatty Acids (OMEGA 3 500 PO), Take 500 mg by mouth daily. (Patient not taking: Reported on 05/05/2024), Disp: , Rfl:    ONETOUCH ULTRA TEST test strip, 1 each 3 (three) times daily., Disp: , Rfl:    PREVIDENT 5000 SENSITIVE 1.1-5 % GEL, Place 1 Application  onto teeth 2 (two) times daily., Disp: , Rfl:    REPATHA SURECLICK 140 MG/ML SOAJ, Inject 1 mL into the skin every 14 (fourteen) days., Disp: , Rfl:   Past Medical History: Past Medical History:  Diagnosis Date   Carotid artery thrombosis    Left Carotid Artery   Coronary artery disease    Diabetes (HCC)    Diverticulosis    History of kidney stones    Hyperlipidemia    Hypertension    Renal mass    Sleep apnea    C-PAP    Tobacco Use: Social History   Tobacco Use  Smoking Status Never  Smokeless Tobacco Never    Labs: Review Flowsheet       Latest Ref Rng & Units 12/08/2023 12/10/2023 12/12/2023  Labs for ITP Cardiac and Pulmonary Rehab  Hemoglobin A1c 4.8 - 5.6 % 5.6  - -  PH, Arterial 7.35 - 7.45 - 7.317  7.356  7.386  7.419  7.416  7.298  7.387  7.376   PCO2 arterial 32 - 48 mmHg - 43.3  41.6  37.1  33.4  35.2  51.0  39.0  35.8   Bicarbonate 20.0 - 28.0 mmol/L - 22.0  23.2  22.7  21.6  22.6  23.1  25.0  23.5  20.9   TCO2 22 - 32 mmol/L - 23  24  24  21  23   23  24  25  24  27  25  24  25  22    Acid-base deficit 0.0 - 2.0 mmol/L - 4.0  2.0  3.0  3.0  2.0  3.0  2.0  1.0  4.0   O2 Saturation % - 93  93  97  100  100  87  100  100  91     Details       Multiple values from one day are sorted in reverse-chronological order          Exercise Target Goals: Exercise Program Goal: Individual exercise prescription set using results from initial 6 min walk test and THRR while considering  patient's activity barriers and safety.   Exercise Prescription Goal: Initial exercise prescription builds to 30-45 minutes a day of aerobic activity, 2-3 days per week.  Home exercise guidelines will be given to patient during program as part of exercise prescription that the participant will acknowledge.   Education: Aerobic Exercise: - Group verbal and visual presentation on the components of exercise prescription. Introduces F.I.T.T principle from ACSM for exercise  prescriptions.  Reviews F.I.T.T. principles of aerobic exercise including progression. Written material provided at class time. Flowsheet Row Cardiac Rehab from 03/31/2024 in [facility] Cardiac and Pulmonary Rehab  Education need identified 01/20/24    Education: Resistance Exercise: - Group verbal and visual presentation on the components of exercise prescription. Introduces F.I.T.T principle from ACSM for exercise prescriptions  Reviews F.I.T.T. principles of resistance exercise including progression. Written material provided at class time. Flowsheet Row Cardiac Rehab from 11/06/2022 in [facility] Cardiac and Pulmonary Rehab  Date 10/09/22  Educator [facility]  Instruction Review Code 1- Verbalizes Understanding     Education: Exercise & Equipment Safety: - Individual verbal instruction and demonstration of equipment use and safety with use of the equipment. Flowsheet Row Cardiac Rehab from 03/31/2024 in [facility] Cardiac and Pulmonary Rehab  Date 01/18/24  Educator jh  Instruction Review Code 1- Verbalizes Understanding    Education: Exercise Physiology & General Exercise Guidelines: - Group verbal and written instruction with models to review the exercise physiology of the cardiovascular system and associated critical values. Provides general exercise guidelines with specific guidelines to those with heart or lung disease. Written material provided at class time.   Education: Flexibility, Balance, Mind/Body Relaxation: - Group verbal and visual presentation with interactive activity on the components of exercise prescription. Introduces F.I.T.T principle from ACSM for exercise prescriptions. Reviews F.I.T.T. principles of flexibility and balance exercise training including progression. Also discusses the mind body connection.  Reviews various relaxation techniques to help reduce and manage stress (i.e. Deep breathing, progressive muscle relaxation, and visualization). Balance handout provided to take home.  Written material provided at class time. Flowsheet Row Cardiac Rehab from 11/06/2022 in [facility] Cardiac and Pulmonary Rehab  Date 10/16/22  Educator  [facility]  Instruction Review Code 1- Verbalizes Understanding    Activity Barriers & Risk Stratification:  Activity Barriers & Cardiac Risk Stratification - 01/20/24 9077       Activity Barriers & Cardiac Risk Stratification   Activity Barriers None;Other (comment)    Comments lifting restrictions <10 lb    Cardiac Risk Stratification High          6 Minute Walk:  6 Minute Walk     Row Name 01/20/24 0924 05/26/24 0928       6 Minute Walk   Phase Initial Discharge    Distance 1560 feet 1690 feet    Distance % Change -- 8.3 %  Distance Feet Change -- 130 ft    Walk Time 6 minutes 6 minutes    # of Rest Breaks 0 0    MPH 2.95 3.2    METS 3.32 3.51    RPE 8 10    Perceived Dyspnea  0 0    VO2 Peak 11.63 12.3    Symptoms No No    Resting HR 46 bpm 51 bpm    Resting BP 124/64 132/76    Resting Oxygen Saturation  100 % 96 %    Exercise Oxygen Saturation  during 6 min walk 99 % 93 %    Max Ex. HR 72 bpm 84 bpm    Max Ex. BP 148/66 138/68    2 Minute Post BP 140/70 --       Oxygen Initial Assessment:   Oxygen Re-Evaluation:   Oxygen Discharge (Final Oxygen Re-Evaluation):   Initial Exercise Prescription:  Initial Exercise Prescription - 01/20/24 0900       Date of Initial Exercise RX and Referring Provider   Date 01/20/24    Referring Provider Dr. Marsa Dooms      Oxygen   Maintain Oxygen Saturation 88% or higher      Treadmill   MPH 2.8    Grade 1    Minutes 15    METs 3.53      Elliptical   Level 1    Speed 3    Minutes 15    METs 3.32      REL-XR   Level 3    Speed 50    Minutes 15    METs 3.32      Prescription Details   Frequency (times per week) 2    Duration Progress to 30 minutes of continuous aerobic without signs/symptoms of physical distress      Intensity   THRR 40-80% of Max  Heartrate 87-129    Ratings of Perceived Exertion 11-13    Perceived Dyspnea 0-4      Progression   Progression Continue to progress workloads to maintain intensity without signs/symptoms of physical distress.      Resistance Training   Training Prescription Yes    Weight 4 lb    Reps 10-15          Perform Capillary Blood Glucose checks as needed.  Exercise Prescription Changes:   Exercise Prescription Changes     Row Name 01/20/24 0900 02/04/24 1400 02/18/24 1600 03/03/24 1600 03/08/24 0900     Response to Exercise   Blood Pressure (Admit) 124/64 110/60 122/62 118/62 --   Blood Pressure (Exercise) 148/66 136/64 144/76 148/60 --   Blood Pressure (Exit) 140/70 108/58 114/66 124/66 --   Heart Rate (Admit) 46 bpm 43 bpm 53 bpm 68 bpm --   Heart Rate (Exercise) 72 bpm 107 bpm 91 bpm 93 bpm --   Heart Rate (Exit) 48 bpm 50 bpm 62 bpm 50 bpm --   Oxygen Saturation (Admit) 100 % -- -- -- --   Oxygen Saturation (Exercise) 99 % -- -- -- --   Rating of Perceived Exertion (Exercise) 8 13 14 12  --   Perceived Dyspnea (Exercise) 0 0 -- -- --   Symptoms none none none none --   Comments Results first two weeks of exercise -- -- --   Duration -- Progress to 30 minutes of  aerobic without signs/symptoms of physical distress Continue with 30 min of aerobic exercise without signs/symptoms of physical distress. Continue with 30 min of  aerobic exercise without signs/symptoms of physical distress. Continue with 30 min of aerobic exercise without signs/symptoms of physical distress.   Intensity -- THRR unchanged THRR unchanged THRR unchanged THRR unchanged     Progression   Progression -- Continue to progress workloads to maintain intensity without signs/symptoms of physical distress. Continue to progress workloads to maintain intensity without signs/symptoms of physical distress. Continue to progress workloads to maintain intensity without signs/symptoms of physical distress. Continue to  progress workloads to maintain intensity without signs/symptoms of physical distress.   Average METs -- 4.02 4.27 4.47 4.47     Resistance Training   Training Prescription -- Yes Yes Yes Yes   Weight -- 4lb 4 lb 10lb 10lb   Reps -- 10-15 10-15 10-15 10-15     Interval Training   Interval Training -- No No No No     Treadmill   MPH -- 2.9 3 3 3    Grade -- 2 4.5 4.5 4.5   Minutes -- 15 15 15 15    METs -- 4.02 5.16 5.16 5.16     Elliptical   Level -- -- 1 1 1    Speed -- -- 3 -- --   Minutes -- -- 15 15 15    METs -- -- 3.1 4.2 4.2     REL-XR   Level -- 5 6 5 5    Minutes -- 15 15 15 15    METs -- -- -- 3.6 3.6     Home Exercise Plan   Plans to continue exercise at -- -- -- -- Home (comment)  walking and hand weights.   Frequency -- -- -- -- Add 2 additional days to program exercise sessions.   Initial Home Exercises Provided -- -- -- -- 03/08/24     Oxygen   Maintain Oxygen Saturation -- 88% or higher 88% or higher 88% or higher 88% or higher    Row Name 03/15/24 1300 03/30/24 1300 04/12/24 0800 04/28/24 0900 05/12/24 1000     Response to Exercise   Blood Pressure (Admit) 102/60 126/58 124/62 122/60 126/62   Blood Pressure (Exit) 108/62 110/58 124/64 118/62 128/80   Heart Rate (Admit) 64 bpm 74 bpm 61 bpm 72 bpm 58 bpm   Heart Rate (Exercise) 99 bpm 97 bpm 120 bpm 119 bpm 119 bpm   Heart Rate (Exit) 62 bpm 66 bpm 77 bpm 73 bpm 69 bpm   Rating of Perceived Exertion (Exercise) 11 12 13 12 13    Symptoms none none none none none   Duration Continue with 30 min of aerobic exercise without signs/symptoms of physical distress. Continue with 30 min of aerobic exercise without signs/symptoms of physical distress. Continue with 30 min of aerobic exercise without signs/symptoms of physical distress. Continue with 30 min of aerobic exercise without signs/symptoms of physical distress. Continue with 30 min of aerobic exercise without signs/symptoms of physical distress.   Intensity THRR  unchanged THRR unchanged THRR unchanged THRR unchanged THRR unchanged     Progression   Progression Continue to progress workloads to maintain intensity without signs/symptoms of physical distress. Continue to progress workloads to maintain intensity without signs/symptoms of physical distress. Continue to progress workloads to maintain intensity without signs/symptoms of physical distress. Continue to progress workloads to maintain intensity without signs/symptoms of physical distress. Continue to progress workloads to maintain intensity without signs/symptoms of physical distress.   Average METs 4.86 4.92 5.25 5.35 5.05     Resistance Training   Training Prescription Yes Yes Yes Yes Yes  Weight 10 lb 10 lb 10 lb 10 lb 10 lb   Reps 10-15 10-15 10-15 10-15 10-15     Interval Training   Interval Training No No No No No     Treadmill   MPH 3 3 3 3  3.1   Grade 7 6.5 7 8 6    Minutes 15 15 15 15 15    METs 6.19 5.99 6.19 6.61 5.94     Elliptical   Level 3 3 3 3 3    Speed 6 0 0 4 3.6   Minutes 15 15 15 15 15    METs 5.5 4.1 4.5 4.1 4.1     REL-XR   Level 7 -- -- -- --   Minutes 15 -- -- -- --   METs 3.7 -- -- -- --     Rower   Level -- 5 7 6 6    Watts -- 25 44 51 30   Minutes -- 15 15 15 15    METs -- 4.23 4.23 4.22 4.23     Home Exercise Plan   Plans to continue exercise at Home (comment)  walking and hand weights. Home (comment)  walking and hand weights. Home (comment)  walking and hand weights. Home (comment)  walking and hand weights. Home (comment)  walking and hand weights.   Frequency Add 2 additional days to program exercise sessions. Add 2 additional days to program exercise sessions. Add 2 additional days to program exercise sessions. Add 2 additional days to program exercise sessions. Add 2 additional days to program exercise sessions.   Initial Home Exercises Provided 03/08/24 03/08/24 03/08/24 03/08/24 03/08/24     Oxygen   Maintain Oxygen Saturation 88% or higher 88%  or higher 88% or higher 88% or higher 88% or higher    Row Name 05/23/24 1500             Response to Exercise   Blood Pressure (Admit) 120/66       Blood Pressure (Exit) 118/66       Heart Rate (Admit) 62 bpm       Heart Rate (Exercise) 97 bpm       Heart Rate (Exit) 61 bpm       Rating of Perceived Exertion (Exercise) 13       Symptoms none       Duration Continue with 30 min of aerobic exercise without signs/symptoms of physical distress.       Intensity THRR unchanged         Progression   Progression Continue to progress workloads to maintain intensity without signs/symptoms of physical distress.       Average METs 6.26         Resistance Training   Training Prescription Yes       Weight 10 lb       Reps 10-15         Interval Training   Interval Training No         Treadmill   MPH 3       Grade 8       Minutes 15       METs 6.61         Elliptical   Level 3       Speed 3.6       Minutes 15         Home Exercise Plan   Plans to continue exercise at Home (comment)  walking and hand weights.       Frequency  Add 2 additional days to program exercise sessions.       Initial Home Exercises Provided 03/08/24         Oxygen   Maintain Oxygen Saturation 88% or higher          Exercise Comments:   Exercise Comments     Row Name 01/28/24 1029           Exercise Comments irst full day of exercise!  Patient was oriented to gym and equipment including functions, settings, policies, and procedures.  Patient's individual exercise prescription and treatment plan were reviewed.  All starting workloads were established based on the results of the 6 minute walk test done at initial orientation visit.  The plan for exercise progression was also introduced and progression will be customized based on patient's performance and goals.          Exercise Goals and Review:   Exercise Goals     Row Name 01/20/24 0923             Exercise Goals   Increase Physical  Activity Yes       Intervention Provide advice, education, support and counseling about physical activity/exercise needs.;Develop an individualized exercise prescription for aerobic and resistive training based on initial evaluation findings, risk stratification, comorbidities and participant's personal goals.       Expected Outcomes Short Term: Attend rehab on a regular basis to increase amount of physical activity.;Long Term: Add in home exercise to make exercise part of routine and to increase amount of physical activity.;Long Term: Exercising regularly at least 3-5 days a week.       Increase Strength and Stamina Yes       Intervention Provide advice, education, support and counseling about physical activity/exercise needs.;Develop an individualized exercise prescription for aerobic and resistive training based on initial evaluation findings, risk stratification, comorbidities and participant's personal goals.       Expected Outcomes Short Term: Increase workloads from initial exercise prescription for resistance, speed, and METs.;Short Term: Perform resistance training exercises routinely during rehab and add in resistance training at home;Long Term: Improve cardiorespiratory fitness, muscular endurance and strength as measured by increased METs and functional capacity ( )       Able to understand and use rate of perceived exertion (RPE) scale Yes       Intervention Provide education and explanation on how to use RPE scale       Expected Outcomes Short Term: Able to use RPE daily in rehab to express subjective intensity level;Long Term:  Able to use RPE to guide intensity level when exercising independently       Able to understand and use Dyspnea scale Yes       Intervention Provide education and explanation on how to use Dyspnea scale       Expected Outcomes Short Term: Able to use Dyspnea scale daily in rehab to express subjective sense of shortness of breath during exertion;Long Term: Able to  use Dyspnea scale to guide intensity level when exercising independently       Knowledge and understanding of Target Heart Rate Range (THRR) Yes       Intervention Provide education and explanation of THRR including how the numbers were predicted and where they are located for reference       Expected Outcomes Short Term: Able to state/look up THRR;Long Term: Able to use THRR to govern intensity when exercising independently;Short Term: Able to use daily as guideline for intensity in rehab  Able to check pulse independently Yes       Intervention Provide education and demonstration on how to check pulse in carotid and radial arteries.;Review the importance of being able to check your own pulse for safety during independent exercise       Expected Outcomes Short Term: Able to explain why pulse checking is important during independent exercise       Understanding of Exercise Prescription Yes       Intervention Provide education, explanation, and written materials on patient's individual exercise prescription       Expected Outcomes Short Term: Able to explain program exercise prescription;Long Term: Able to explain home exercise prescription to exercise independently          Exercise Goals Re-Evaluation :  Exercise Goals Re-Evaluation     Row Name 01/28/24 1029 02/04/24 1424 02/11/24 0941 02/18/24 1609 02/23/24 0939     Exercise Goal Re-Evaluation   Exercise Goals Review -- Increase Physical Activity;Increase Strength and Stamina;Understanding of Exercise Prescription Increase Physical Activity;Increase Strength and Stamina;Understanding of Exercise Prescription Increase Physical Activity;Increase Strength and Stamina;Understanding of Exercise Prescription Increase Physical Activity;Increase Strength and Stamina;Understanding of Exercise Prescription   Comments Reviewed RPE and dyspnea scale, THR and program prescription with pt today.  Pt voiced understanding and was given a copy of goals to  take home. Russell is off to a good start in the program. He was able to attend his first and only session during this review period. During his session he was able to use the treadmill at a speed of 2.9 mph and an incline of 2%, as well as the XR at level 5. We will continue to monitor his progress in the program. Alireza is doing well at rehab, he is increasing workload as able. At home he is doing some hand weight exercise and walking 3-5 times per day Vivan continues to do well in rehab. He increased his treadmill worload to a speed of 3 mph with an incline of 4.5%. He also began using the elliptical at level 1 and improved to level 6 on the XR. We will continue to monitor his progress in the program. Hue is doing well at rehab, he is increasing his workload during visits. He says the consistentcy of attending rehab has helped him improve in stamina. He is walking and using handweights most days at home.   Expected Outcomes Short: Use RPE daily to regulate intensity. Long: Follow program prescription in THR. Short: Continue to follow exercise prescription. Long: Continue exercise to improve strength and stamina. STG: Increase workload as able. LTG: Continue exercise to improve strength and stamina. Short: Continue to progressively increase treadmill workload. Long: Continue exercise to improve strength and stamina. STG: Continue to attend rehab and exercise at home. Continue exercise to improve strength and stamina.    Row Name 03/03/24 1635 03/08/24 0943 03/15/24 1332 03/30/24 1329 04/12/24 0805     Exercise Goal Re-Evaluation   Exercise Goals Review Increase Physical Activity;Increase Strength and Stamina;Understanding of Exercise Prescription Increase Physical Activity;Able to understand and use rate of perceived exertion (RPE) scale;Knowledge and understanding of Target Heart Rate Range (THRR);Understanding of Exercise Prescription;Increase Strength and Stamina;Able to understand and use Dyspnea  scale;Able to check pulse independently Increase Physical Activity;Increase Strength and Stamina;Understanding of Exercise Prescription Increase Physical Activity;Increase Strength and Stamina;Understanding of Exercise Prescription Increase Physical Activity;Increase Strength and Stamina;Understanding of Exercise Prescription   Comments Burnette continues to do well in rehab. He was able to maintain his workload  on the treadmill at and 4.5% incline. He was also able to maintain level 1 on the elliptical. We will continue to encourage and monitor his progress in the program. Reviewed home exercise with pt today.  Pt plans to walk and use hand weights for exercise.  Reviewed THR, pulse, RPE, sign and symptoms, pulse oximetery and when to call 911 or MD.  Also discussed weather considerations and indoor options.  Pt voiced understanding. Gustavo continues to do well in rehab. He recently increased his treadmill workload by increasing his incline to 7% while maintaining his speed at 3 mph. He also improved to level 3 on the elliptical and level 7 on the XR. We will continue to monitor his progress in the program. Bryndan is doing well in rehab. He has been able to maintain his workload on the treadmill at a speed of and 6.5% incline. He was recently able to add the rowing machine to his exercise prescription at level 5. We will continue to monitor his progress in the program. Raheel continues to do well in rehab. He increased his treadmill workload back up to an incline of 7% while maintaining a speed of 3 mph. He also improved to level 7 on the rower machine. We will continue to monitor his progress in the program.   Expected Outcomes Short: Increase treadmill incline to 5%. Long: Continue exercise to imptove strength and stamina. Short: add 1-2 days a week of exercies at home on off days of cardiac rehab. Long: maintain independent exercise routine upon graduation from cardiac rehab. Short: Continue to progressively  increase treadmill workload. Long: Continue exercise to improve strength and stamina. Short: Continue to progressively increase treadmill workload. Long: Continue exercise to improve strength and stamina. Short: Continue to progressively increase treadmill workload. Long: Continue exercise to improve strength and stamina.    Row Name 04/12/24 1000 04/28/24 0935 05/12/24 1040 05/23/24 1547 06/07/24 1029     Exercise Goal Re-Evaluation   Exercise Goals Review Increase Physical Activity;Increase Strength and Stamina;Understanding of Exercise Prescription Increase Physical Activity;Increase Strength and Stamina;Understanding of Exercise Prescription Increase Physical Activity;Increase Strength and Stamina;Understanding of Exercise Prescription Increase Physical Activity;Increase Strength and Stamina;Understanding of Exercise Prescription Increase Physical Activity;Increase Strength and Stamina;Understanding of Exercise Prescription   Comments Durand continues to exercise at home. He has a walking pad at home that he uses everyday that he doesnt attend the program. He also has access to handweights at home that he uses, and tries to do the same workouts as in the program. Trentin continues to do well in the program. He recently increased his treadmill incline to 8% while maintaining his speed at 3 mph. He also continues to work at level 3 on the elliptical and level 6 on the rower. We will continue to monitor his progress in the program. Xhaiden continues to do well in the program and he only attended 2 sessions in this review. He increased his speed on the treadmill to 3.1 mph and had an incline of 6%. He maintained level 3 on the elliptical and level 6 on the rower. We will continue to monitor his progress in the program. Zechariah only attended two sessions since the last review. He continues to do well on the elliptical at level 3 and the treadmill at a speed of 3 mph with an 8% incline. He is also due for his post and  will look to improve on it. We will continue to monitor his progress in the program.  Theophil is doing well with his exercise at home. He is walking daily around 2 miles. He also does handweights for 3-4 days a week. He is graduating very soon and plans to rejoin [employer] after graduation.   Expected Outcomes Short: Continue to exercise at home. Long: Exercise in the program and at home to improve strength and stamina. Short: Continue to progressively increase treadmill workload. Long: Continue exercise to improve strength and stamina. Short: Attend more consistently and continue to progressively increase treadmill and elliptical workloads. Long: Continue exercise to improve strength and stamina. Short: Improve on post . Long: Continue to increase overall METs and stamina. Short: Keep up home exercise and graduate from program. Long: Continue exercise independently.      Discharge Exercise Prescription (Final Exercise Prescription Changes):  Exercise Prescription Changes - 05/23/24 1500       Response to Exercise   Blood Pressure (Admit) 120/66    Blood Pressure (Exit) 118/66    Heart Rate (Admit) 62 bpm    Heart Rate (Exercise) 97 bpm    Heart Rate (Exit) 61 bpm    Rating of Perceived Exertion (Exercise) 13    Symptoms none    Duration Continue with 30 min of aerobic exercise without signs/symptoms of physical distress.    Intensity THRR unchanged      Progression   Progression Continue to progress workloads to maintain intensity without signs/symptoms of physical distress.    Average METs 6.26      Resistance Training   Training Prescription Yes    Weight 10 lb    Reps 10-15      Interval Training   Interval Training No      Treadmill   MPH 3    Grade 8    Minutes 15    METs 6.61      Elliptical   Level 3    Speed 3.6    Minutes 15      Home Exercise Plan   Plans to continue exercise at Home (comment)   walking and hand weights.   Frequency Add 2 additional days  to program exercise sessions.    Initial Home Exercises Provided 03/08/24      Oxygen   Maintain Oxygen Saturation 88% or higher          Nutrition:  Target Goals: Understanding of nutrition guidelines, daily intake of sodium 1500mg , cholesterol 200mg , calories 30% from fat and 7% or less from saturated fats, daily to have 5 or more servings of fruits and vegetables.  Education: Nutrition 1 -Group instruction provided by verbal, written material, interactive activities, discussions, models, and posters to present general guidelines for heart healthy nutrition including macronutrients, label reading, and promoting whole foods over processed counterparts. Education serves as Pensions consultant of discussion of heart healthy eating for all. Written material provided at class time.    Education: Nutrition 2 -Group instruction provided by verbal, written material, interactive activities, discussions, models, and posters to present general guidelines for heart healthy nutrition including sodium, cholesterol, and saturated fat. Providing guidance of habit forming to improve blood pressure, cholesterol, and body weight. Written material provided at class time.     Biometrics:  Pre Biometrics - 01/20/24 0923       Pre Biometrics   Height 5' 7.5 (1.715 m)    Weight 162 lb 4.8 oz (73.6 kg)    Waist Circumference 35 inches    Hip Circumference 37.5 inches    Waist to Hip Ratio 0.93 %  BMI (Calculated) 25.03    Single Leg Stand 30 seconds          Post Biometrics - 05/26/24 0929        Post  Biometrics   Height 5' 7.5 (1.715 m)    Weight 167 lb 12.8 oz (76.1 kg)    Waist Circumference 37 inches    Hip Circumference 39 inches    Waist to Hip Ratio 0.95 %    BMI (Calculated) 25.88    Single Leg Stand 24.9 seconds          Nutrition Therapy Plan and Nutrition Goals:  Nutrition Therapy & Goals - 01/20/24 0920       Nutrition Therapy   RD appointment deferred Yes       Intervention Plan   Intervention Prescribe, educate and counsel regarding individualized specific dietary modifications aiming towards targeted core components such as weight, hypertension, lipid management, diabetes, heart failure and other comorbidities.    Expected Outcomes Short Term Goal: Understand basic principles of dietary content, such as calories, fat, sodium, cholesterol and nutrients.;Short Term Goal: A plan has been developed with personal nutrition goals set during dietitian appointment.;Long Term Goal: Adherence to prescribed nutrition plan.          Nutrition Assessments:  MEDIFICTS Score Key: >=70 Need to make dietary changes  40-70 Heart Healthy Diet <= 40 Therapeutic Level Cholesterol Diet  Flowsheet Row Cardiac Rehab from 01/20/2024 in [facility] Cardiac and Pulmonary Rehab  Picture Your Plate Total Score on Admission 64   Picture Your Plate Scores: <59 Unhealthy dietary pattern with much room for improvement. 41-50 Dietary pattern unlikely to meet recommendations for good health and room for improvement. 51-60 More healthful dietary pattern, with some room for improvement.  >60 Healthy dietary pattern, although there may be some specific behaviors that could be improved.    Nutrition Goals Re-Evaluation:  Nutrition Goals Re-Evaluation     Row Name 02/11/24 [phone] 02/23/24 0951 04/12/24 0950 06/07/24 1035       Goals   Current Weight -- -- 170 lb 9.6 oz (77.4 kg) --    Comment Arley deffered RD appointment Deferred Deferred Deferred       Nutrition Goals Discharge (Final Nutrition Goals Re-Evaluation):  Nutrition Goals Re-Evaluation - 06/07/24 1035       Goals   Comment Deferred          Psychosocial: Target Goals: Acknowledge presence or absence of significant depression and/or stress, maximize coping skills, provide positive support system. Participant is able to verbalize types and ability to use techniques and skills needed for reducing stress and  depression.   Education: Stress, Anxiety, and Depression - Group verbal and visual presentation to define topics covered.  Reviews how body is impacted by stress, anxiety, and depression.  Also discusses healthy ways to reduce stress and to treat/manage anxiety and depression. Written material provided at class time. Flowsheet Row Cardiac Rehab from 03/31/2024 in [facility] Cardiac and Pulmonary Rehab  Date 03/31/24  Educator SB  Instruction Review Code 1- [employer] Understanding    Education: Sleep Hygiene -Provides group verbal and written instruction about how sleep can affect your health.  Define sleep hygiene, discuss sleep cycles and impact of sleep habits. Review good sleep hygiene tips.   Initial Review & Psychosocial Screening:  Initial Psych Review & Screening - 01/18/24 1340       Initial Review   Current issues with None Identified      Family Dynamics   Good  Support System? Yes    Comments He can look to his friends and family for support. 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      Barriers   Psychosocial barriers to participate in program There are no identifiable barriers or psychosocial needs.;The patient should benefit from training in stress management and relaxation.      Screening Interventions   Interventions Encouraged to exercise;Provide feedback about the scores to participant;To provide support and resources with identified psychosocial needs    Expected Outcomes Short Term goal: Utilizing psychosocial counselor, staff and physician to assist with identification of specific Stressors or current issues interfering with healing process. Setting desired goal for each stressor or current issue identified.;Long Term Goal: Stressors or current issues are controlled or eliminated.;Long Term goal: The participant improves quality of Life and PHQ9 Scores as seen by post scores and/or verbalization of changes;Short Term goal:  Identification and review with participant of any Quality of Life or Depression concerns found by scoring the questionnaire.          Quality of Life Scores:   Quality of Life - 01/20/24 0919       Quality of Life   Select Quality of Life      Quality of Life Scores   Health/Function Pre 17.5 %    Socioeconomic Pre 21.63 %    Psych/Spiritual Pre 22.5 %    Family Pre 19.6 %    GLOBAL Pre 19.74 %         Scores of 19 and below usually indicate a poorer quality of life in these areas.  A difference of  2-3 points is a clinically meaningful difference.  A difference of 2-3 points in the total score of the Quality of Life Index has been associated with significant improvement in overall quality of life, self-image, physical symptoms, and general health in studies assessing change in quality of life.  PHQ-9: Review Flowsheet  More data exists      06/07/2024 02/23/2024 01/20/2024 11/06/2022 10/09/2022  Depression screen PHQ 2/9  Decreased Interest 0 1 1 0 0  Down, Depressed, Hopeless 1 1 1  0 1  PHQ - 2 Score 1 2 2  0 1  Altered sleeping 2 2 2  0 1  Tired, decreased energy 2 2 2 1 1   Change in appetite 1 0 0 0 0  Feeling bad or failure about yourself  1 0 0 0 1  Trouble concentrating 1 0 1 1 1   Moving slowly or fidgety/restless 0 0 0 1 0  Suicidal thoughts 0 0 0 0 0  PHQ-9 Score 8 6 7 3 5   Difficult doing work/chores Not difficult at all Somewhat difficult Not difficult at all Not difficult at all Somewhat difficult   Interpretation of Total Score  Total Score Depression Severity:  1-4 = Minimal depression, 5-9 = Mild depression, 10-14 = Moderate depression, 15-19 = Moderately severe depression, 20-27 = Severe depression   Psychosocial Evaluation and Intervention:  Psychosocial Evaluation - 01/18/24 1341       Psychosocial Evaluation & Interventions   Interventions Encouraged to exercise with the program and follow exercise prescription;Relaxation education;Stress management  education    Comments He can look to his friends and family for support. 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    Expected Outcomes Short: Start [facility] to help with mood. Long: Maintain a healthy mental state  Continue Psychosocial Services  Follow up required by staff          Psychosocial Re-Evaluation:  Psychosocial Re-Evaluation     Row Name 02/11/24 [phone] 02/23/24 0943 04/12/24 0946 06/07/24 1031       Psychosocial Re-Evaluation   Current issues with Current Sleep Concerns Current Sleep Concerns Current Sleep Concerns None Identified    Comments Jabaree struggles to stay asleep and wakes up 4-5 times per night. Reports he gets 2-7hrs of sleep. He has been working with his Dr to improve it. Marco denies any stress, depression, or anxiety. He is still struggling with sleep. Has a apnea machine but reports it doesn't help. Zong is not dealing with any stressors at this time. He reports that rehab has drastically improved his sleep habits. He has returned to sleeping in his bed as opposed to the recliner. Trong is not dealing with any current stressors. The only instance of a stressor would be around his heart health as he has been through cardiac rehab twice and is nervous whenever he notices something off related to his heart. He does not have any stress with work or home and is semi-retired. He has noticed major improvements in his sleeping, but it is not where he wants it to be. He aims for 7 hours a night and starts out in the recliner for 2 hours and gets up and goes to bed for the other 5 hours. He has a strong support system with his wife and daughter and many friends here. His PHQ score reassessed today was 8.    Expected Outcomes STG: focus on good sleep hygiene. LTG: Achieve and maintain a positive outlook on health and daily life STG: Focus on good sleep hygiene. LTG: Achieve and maintain a positive outlook on health and daily life Short:  Continue to improve sleeping habits, manage stressors in healthy means. Long: Continue to attend rehab to help manage stressors. Short: Continue to improve sleeping habits and manage any stressors that arise. Long: Continue to mange any stressors that arise    Interventions Encouraged to attend Cardiac Rehabilitation for the exercise Encouraged to attend Cardiac Rehabilitation for the exercise Encouraged to attend Cardiac Rehabilitation for the exercise Encouraged to attend Cardiac Rehabilitation for the exercise    Continue Psychosocial Services  Follow up required by staff Follow up required by staff Follow up required by staff Follow up required by staff       Psychosocial Discharge (Final Psychosocial Re-Evaluation):  Psychosocial Re-Evaluation - 06/07/24 1031       Psychosocial Re-Evaluation   Current issues with None Identified    Comments Rigel is not dealing with any current stressors. The only instance of a stressor would be around his heart health as he has been through cardiac rehab twice and is nervous whenever he notices something off related to his heart. He does not have any stress with work or home and is semi-retired. He has noticed major improvements in his sleeping, but it is not where he wants it to be. He aims for 7 hours a night and starts out in the recliner for 2 hours and gets up and goes to bed for the other 5 hours. He has a strong support system with his wife and daughter and many friends here. His PHQ score reassessed today was 8.    Expected Outcomes Short: Continue to improve sleeping habits and manage any stressors that arise. Long: Continue to mange any stressors that arise    Interventions  Encouraged to attend Cardiac Rehabilitation for the exercise    Continue Psychosocial Services  Follow up required by staff          Vocational Rehabilitation: Provide vocational rehab assistance to qualifying candidates.   Vocational Rehab Evaluation &  Intervention:   Education: Education Goals: Education classes will be provided on a variety of topics geared toward better understanding of heart health and risk factor modification. Participant will state understanding/return demonstration of topics presented as noted by education test scores.  Learning Barriers/Preferences:  Learning Barriers/Preferences - 01/18/24 1340       Learning Barriers/Preferences   Learning Barriers None    Learning Preferences None          General Cardiac Education Topics:  AED/CPR: - Group verbal and written instruction with the use of models to demonstrate the basic use of the AED with the basic ABC's of resuscitation.   Test and Procedures: - Group verbal and visual presentation and models provide information about basic cardiac anatomy and function. Reviews the testing methods done to diagnose heart disease and the outcomes of the test results. Describes the treatment choices: Medical Management, Angioplasty, or Coronary Bypass Surgery for treating various heart conditions including Myocardial Infarction, Angina, Valve Disease, and Cardiac Arrhythmias. Written material provided at class time. Flowsheet Row Cardiac Rehab from 03/31/2024 in [facility] Cardiac and Pulmonary Rehab  Education need identified 01/20/24    Medication Safety: - Group verbal and visual instruction to review commonly prescribed medications for heart and lung disease. Reviews the medication, class of the drug, and side effects. Includes the steps to properly store meds and maintain the prescription regimen. Written material provided at class time. Flowsheet Row Cardiac Rehab from 11/06/2022 in [facility] Cardiac and Pulmonary Rehab  Date 11/06/22  Educator [facility]  Instruction Review Code 1- Verbalizes Understanding    Intimacy: - Group verbal instruction through game format to discuss how heart and lung disease can affect sexual intimacy. Written material provided at class time.   Know  Your Numbers and Heart Failure: - Group verbal and visual instruction to discuss disease risk factors for cardiac and pulmonary disease and treatment options.  Reviews associated critical values for Overweight/Obesity, Hypertension, Cholesterol, and Diabetes.  Discusses basics of heart failure: signs/symptoms and treatments.  Introduces Heart Failure Zone chart for action plan for heart failure. Written material provided at class time. Flowsheet Row Cardiac Rehab from 03/31/2024 in [facility] Cardiac and Pulmonary Rehab  Education need identified 01/20/24    Infection Prevention: - Provides verbal and written material to individual with discussion of infection control including proper hand washing and proper equipment cleaning during exercise session. Flowsheet Row Cardiac Rehab from 03/31/2024 in [facility] Cardiac and Pulmonary Rehab  Date 01/18/24  Educator jh  Instruction Review Code 1- Verbalizes Understanding    Falls Prevention: - Provides verbal and written material to individual with discussion of falls prevention and safety. Flowsheet Row Cardiac Rehab from 03/31/2024 in [facility] Cardiac and Pulmonary Rehab  Date 01/18/24  Educator jh  Instruction Review Code 1- Verbalizes Understanding    Other: -Provides group and verbal instruction on various topics (see comments) Flowsheet Row Cardiac Rehab from 11/06/2022 in [facility] Cardiac and Pulmonary Rehab  Date 10/30/22  Educator SB  Instruction Review Code 1- Verbalizes Understanding    Knowledge Questionnaire Score:  Knowledge Questionnaire Score - 01/20/24 0919       Knowledge Questionnaire Score   Pre Score 23/26          Core Components/Risk  Factors/Patient Goals at Admission:  Personal Goals and Risk Factors at Admission - 01/18/24 1339       Core Components/Risk Factors/Patient Goals on Admission    Weight Management Yes;Weight Maintenance    Intervention Weight Management: Develop a combined nutrition and exercise program designed  to reach desired caloric intake, while maintaining appropriate intake of nutrient and fiber, sodium and fats, and appropriate energy expenditure required for the weight goal.;Weight Management: Provide education and appropriate resources to help participant work on and attain dietary goals.;Weight Management/Obesity: Establish reasonable short term and long term weight goals.    Expected Outcomes Short Term: Continue to assess and modify interventions until short term weight is achieved;Weight Maintenance: Understanding of the daily nutrition guidelines, which includes 25-35% calories from fat, 7% or less cal from saturated fats, less than 200mg  cholesterol, less than 1.5gm of sodium, & 5 or more servings of fruits and vegetables daily;Understanding recommendations for meals to include 15-35% energy as protein, 25-35% energy from fat, 35-60% energy from carbohydrates, less than 200mg  of dietary cholesterol, 20-35 gm of total fiber daily;Understanding of distribution of calorie intake throughout the day with the consumption of 4-5 meals/snacks    Diabetes Yes    Intervention Provide education about signs/symptoms and action to take for hypo/hyperglycemia.;Provide education about proper nutrition, including hydration, and aerobic/resistive exercise prescription along with prescribed medications to achieve blood glucose in normal ranges: Fasting glucose 65-99 mg/dL    Expected Outcomes Short Term: Participant verbalizes understanding of the signs/symptoms and immediate care of hyper/hypoglycemia, proper foot care and importance of medication, aerobic/resistive exercise and nutrition plan for blood glucose control.;Long Term: Attainment of HbA1C < 7%.    Hypertension Yes    Intervention Provide education on lifestyle modifcations including regular physical activity/exercise, weight management, moderate sodium restriction and increased consumption of fresh fruit, vegetables, and low fat dairy, alcohol moderation,  and smoking cessation.;Monitor prescription use compliance.    Expected Outcomes Short Term: Continued assessment and intervention until BP is < 140/31mm HG in hypertensive participants. < 130/62mm HG in hypertensive participants with diabetes, heart failure or chronic kidney disease.;Long Term: Maintenance of blood pressure at goal levels.          Education:Diabetes - Individual verbal and written instruction to review signs/symptoms of diabetes, desired ranges of glucose level fasting, after meals and with exercise. Acknowledge that pre and post exercise glucose checks will be done for 3 sessions at entry of program. Flowsheet Row Cardiac Rehab from 03/31/2024 in [facility] Cardiac and Pulmonary Rehab  Date 01/18/24  Educator jh  Instruction Review Code 1- Verbalizes Understanding    Core Components/Risk Factors/Patient Goals Review:   Goals and Risk Factor Review     Row Name 02/11/24 0936 02/23/24 0951 04/12/24 0951 06/07/24 1036       Core Components/Risk Factors/Patient Goals Review   Personal Goals Review Hypertension Hypertension Hypertension Weight Management/Obesity;Hypertension;Diabetes    Review Abdulahi is checking his blood pressure at home, says it runs consistently around 110/60s. He reports it matches well with the readings here at rehab. Chasten reports he is checking his BP at home and runs around 110/60. says he feels better when its closer to 120/7o. Encouraged him to drink ~64oz of water , he says he is currently around ~32oz daily. Addison is taking his BP at home when he feels unusual. He states that the program has made him feel more in control of his BP, and has stayed around 110/60. Currie is doing well with maintaining his weight. He  has a strong diet that he is happy with. It involves lower carb, not eating late, good protein intake, lower fat when able, no sweets, and no processed foods. He states he is up a little in weight and could lose 5 lbs, but he is happy with where he is at.  He is checking his blood pressure regularly and is getting readings in the 110-130s for systolic and 60-70s for diastolic. He states his blood pressure meds are finally set right. He is still watching his A1c for his diabetes and it is currently at 6.1. His doctor was happy with this as he used to be at 8, but does not want his A1c to go any higher.    Expected Outcomes STG: Continue to check BP regularly. LTG: manage risk factors independently STG: Drink more water , check BP at home.  LTG: manage risk factors independently Short: Check BP at home more frequently. Long: Continue to monitor BP to manage hypertension. Short: Cotinue dietary habits, checking blood pressure, and watching his A1c levels. Long: Continue healthy diet and to manage hypertension and diabetes       Core Components/Risk Factors/Patient Goals at Discharge (Final Review):   Goals and Risk Factor Review - 06/07/24 1036       Core Components/Risk Factors/Patient Goals Review   Personal Goals Review Weight Management/Obesity;Hypertension;Diabetes    Review Kaelin is doing well with maintaining his weight. He has a strong diet that he is happy with. It involves lower carb, not eating late, good protein intake, lower fat when able, no sweets, and no processed foods. He states he is up a little in weight and could lose 5 lbs, but he is happy with where he is at. He is checking his blood pressure regularly and is getting readings in the 110-130s for systolic and 60-70s for diastolic. He states his blood pressure meds are finally set right. He is still watching his A1c for his diabetes and it is currently at 6.1. His doctor was happy with this as he used to be at 8, but does not want his A1c to go any higher.    Expected Outcomes Short: Cotinue dietary habits, checking blood pressure, and watching his A1c levels. Long: Continue healthy diet and to manage hypertension and diabetes          ITP Comments:  ITP Comments     Row Name  01/18/24 1339 01/20/24 0917 01/28/24 1029 02/17/24 1041 03/16/24 1035   ITP Comments Virtual Visit completed. Patient informed on EP and RD appointment and 6 Minute walk test. Patient also informed of patient health questionnaires on My Chart. Patient Verbalizes understanding. Visit diagnosis can be found in [facility] 12/10/2023. Completed and gym orientation for cardiac rehab. Initial ITP created and sent for review to Dr. Oneil Pinal, Medical Director. irst full day of exercise!  Patient was oriented to gym and equipment including functions, settings, policies, and procedures.  Patient's individual exercise prescription and treatment plan were reviewed.  All starting workloads were established based on the results of the 6 minute walk test done at initial orientation visit.  The plan for exercise progression was also introduced and progression will be customized based on patient's performance and goals. 30 Day review completed. Medical Director ITP review done, changes made as directed, and signed approval by Medical Director.    new to program 30 Day review completed. Medical Director ITP review done, changes made as directed, and signed approval by Medical Director.    Row  Name 04/13/24 0957 05/11/24 1006 06/08/24 1452       ITP Comments 30 Day review completed. Medical Director ITP review done, changes made as directed, and signed approval by Medical Director. 30 Day review completed. Medical Director ITP review done, changes made as directed, and signed approval by Medical Director. 30 Day review completed. Medical Director ITP review done, changes made as directed, and signed approval by Medical Director.        Comments: 30 day review

## 2024-06-09 ENCOUNTER — Encounter: Admitting: Emergency Medicine

## 2024-06-09 DIAGNOSIS — Z951 Presence of aortocoronary bypass graft: Secondary | ICD-10-CM

## 2024-06-09 NOTE — Progress Notes (Signed)
 Daily Session Note  Patient Details  Name: Marcus Huang MRN: [id number] Date of Birth: November 10, 1950 Referring Provider:   Flowsheet Row Cardiac Rehab from 01/20/2024 in [facility] Cardiac and Pulmonary Rehab  Referring Provider Dr. Marsa Dooms    Encounter Date: 06/09/2024  Check In:  Session Check In - 06/09/24 0931       Check-In   Supervising physician immediately available to respond to emergencies See telemetry face sheet for immediately available ER MD    Location [facility]-Cardiac & Pulmonary Rehab    Staff Present Selinda Pereyra RDN,LDN;Matasha Smigelski RN,BSN;Joseph Rolinda RCP,RRT,BSRT;Maxon Conetta BS, Exercise Physiologist    Virtual Visit No    Medication changes reported     No    Fall or balance concerns reported    No    Tobacco Cessation No Change    Warm-up and Cool-down Performed on first and last piece of equipment    Resistance Training Performed Yes    VAD Patient? No    PAD/SET Patient? No      Pain Assessment   Currently in Pain? No/denies             Social History   Tobacco Use  Smoking Status Never  Smokeless Tobacco Never    Goals Met:  Independence with exercise equipment Exercise tolerated well No report of concerns or symptoms today Strength training completed today  Goals Unmet:  Not Applicable  Comments: Pt able to follow exercise prescription today without complaint.  Will continue to monitor for progression.    Dr. Oneil Pinal is Medical Director for [facility] Cardiac Rehabilitation.  Dr. Fuad Aleskerov is Medical Director for [facility] Pulmonary Rehabilitation.

## 2024-06-10 ENCOUNTER — Encounter: Admitting: Occupational Therapy

## 2024-06-13 ENCOUNTER — Ambulatory Visit: Admitting: Occupational Therapy

## 2024-06-13 DIAGNOSIS — M25641 Stiffness of right hand, not elsewhere classified: Secondary | ICD-10-CM

## 2024-06-13 DIAGNOSIS — M6281 Muscle weakness (generalized): Secondary | ICD-10-CM

## 2024-06-13 DIAGNOSIS — L905 Scar conditions and fibrosis of skin: Secondary | ICD-10-CM

## 2024-06-13 DIAGNOSIS — M79641 Pain in right hand: Secondary | ICD-10-CM

## 2024-06-13 NOTE — Therapy (Signed)
 OUTPATIENT OCCUPATIONAL THERAPY ORTHO TREATMENT  Patient Name: Marcus Huang MRN: [id number] DOB:28-Jun-1951, 73 y.o., male Today's Date: 06/13/2024  PCP: Dr Kip MART PROVIDER: Kip PA  END OF SESSION:  OT End of Session - 06/13/24 1648     Visit Number 3    Number of Visits 6    Date for Recertification  07/11/24    OT Start Time 1648    OT Stop Time 1723    OT Time Calculation (min) 35 min    Activity Tolerance Patient tolerated treatment well    Behavior During Therapy [facility] for tasks assessed/performed          Past Medical History:  Diagnosis Date   Carotid artery thrombosis    Left Carotid Artery   Coronary artery disease    Diabetes (HCC)    Diverticulosis    History of kidney stones    Hyperlipidemia    Hypertension    Renal mass    Sleep apnea    C-PAP   Past Surgical History:  Procedure Laterality Date   COLONOSCOPY WITH PROPOFOL  N/A 08/15/2019   Procedure: COLONOSCOPY WITH PROPOFOL ;  Surgeon: Jinny Carmine, MD;  Location: [facility];  Service: Endoscopy;  Laterality: N/A;  sleep apnea   CORONARY ARTERY BYPASS GRAFT N/A 12/10/2023   Procedure: CORONARY ARTERY BYPASS GRAFTING (CABG) xTHREE USING LEFT INTERNAL MAMMARY ARTERY AND ENDOSCOPICALLY HARVESTED RIGHT GREATER SAPPHENOUS VEIN;  Surgeon: Lucas Dorise POUR, MD;  Location: [facility] OR;  Service: Open Heart Surgery;  Laterality: N/A;   CORONARY STENT INTERVENTION N/A 08/05/2022   Procedure: CORONARY STENT INTERVENTION;  Surgeon: Ammon Blunt, MD;  Location: [facility] INVASIVE CV LAB;  Service: Cardiovascular;  Laterality: N/A;   CORONARY STENT INTERVENTION Left 08/28/2022   Procedure: CORONARY STENT INTERVENTION;  Surgeon: Ammon Blunt, MD;  Location: [facility] INVASIVE CV LAB;  Service: Cardiovascular;  Laterality: Left;   EYE SURGERY Bilateral    LASIK EYE SURGERY   INCISION AND DRAINAGE OF WOUND Right 05/05/2024   Procedure: IRRIGATION AND DEBRIDEMENT WOUND RIGHT INDEX FINGER, EXPLORATION  WITH IRRIGATION AND DEBRIDEMENT OF FLEXOR SHEATH WITH RELEASE OF A-1 PULLEY;  Surgeon: Edie Norleen PARAS, MD;  Location: [facility] ORS;  Service: Orthopedics;  Laterality: Right;  flexor sheath right index   INGUINAL HERNIA REPAIR Bilateral 05/12/2017   Procedure: LAPAROSCOPIC BILATERAL INGUINAL HERNIA REPAIR, umbilical hernia repair;  Surgeon: Wonda Charlie BRAVO, MD;  Location: [facility] ORS;  Service: General;  Laterality: Bilateral;   INTRAOPERATIVE TRANSESOPHAGEAL ECHOCARDIOGRAM N/A 12/10/2023   Procedure: ECHOCARDIOGRAM, TRANSESOPHAGEAL, INTRAOPERATIVE;  Surgeon: Lucas Dorise POUR, MD;  Location: [facility] OR;  Service: Open Heart Surgery;  Laterality: N/A;   LEFT HEART CATH AND CORONARY ANGIOGRAPHY N/A 08/05/2022   Procedure: LEFT HEART CATH AND CORONARY ANGIOGRAPHY;  Surgeon: Ammon Blunt, MD;  Location: [facility] INVASIVE CV LAB;  Service: Cardiovascular;  Laterality: N/A;   LEFT HEART CATH AND CORONARY ANGIOGRAPHY Left 11/11/2023   Procedure: LEFT HEART CATH AND CORONARY ANGIOGRAPHY;  Surgeon: Ammon Blunt, MD;  Location: [facility] INVASIVE CV LAB;  Service: Cardiovascular;  Laterality: Left;   TONSILLECTOMY     As a child   Patient Active Problem List   Diagnosis Date Noted   Cellulitis of finger of right hand 05/05/2024   Cellulitis 05/05/2024   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   Atherosclerosis of native arteries of extremity with intermittent claudication 10/12/2022   Lymphedema 08/24/2022   Raynaud's disease 08/24/2022   PAD (peripheral artery disease) 08/24/2022   S/P drug eluting coronary stent placement  08/15/2022   CAD (coronary artery disease) 08/05/2022   Screen for colon cancer 03/14/2020   Special screening for malignant neoplasms, colon    Peyronie's disease 07/16/2019   Renal mass 07/10/2019   Heart palpitations 06/27/2019   Carotid artery stenosis 06/02/2019   Umbilical hernia without obstruction and without gangrene 06/02/2019   Aortic atherosclerosis 06/02/2019   Hypertensive urgency  05/31/2019   Non-recurrent bilateral inguinal hernia without obstruction or gangrene    Diabetes type 2, uncontrolled 02/14/2014   Hyperlipidemia, unspecified 02/14/2014   Malignant hypertension 02/14/2014   Type 2 diabetes mellitus without complication, without long-term current use of insulin  (HCC) 02/14/2014   Hyperlipidemia 02/14/2014   Hypertension, essential, benign 02/14/2014    ONSET DATE: 05/05/24  REFERRING DIAG: R hand cellulitis with I&D 2nd digit and palm   THERAPY DIAG:  Stiffness of right hand, not elsewhere classified  Scar tissue  Pain in right hand  Muscle weakness (generalized)  Rationale for Evaluation and Treatment: Rehabilitation  SUBJECTIVE:   SUBJECTIVE STATEMENT:  I can tell is is better - but cannot play with the pick yet or pressure on the side of the tip -and on scar - tender and weak Pt accompanied by: self  PERTINENT HISTORY: 05/17/24 Ortho not :  The patient presents today with a dressing applied to the right hand. Dressing was removed at today's visit. Skin examination of the right hand demonstrates a well-healing incision at the base of the finger on where the entire A1 pulley was released. Sutures are intact without any evidence of loosening. The sutures were removed today without complication. Benzoin and Steri-Strips were applied. The patient has a well-healed stab type incision along the radial aspect of the distal phalanx without any erythema. The patient does still have some mild swelling throughout the finger however wrinkles are beginning to appear. He denies any significant tenderness to palpation along the volar or dorsal aspect of the finger at this time. He is able to fully extend the finger, limitations with flexion due to the continued swelling but no pain with passive flexion or extension of the finger. There is no purulent drainage coming from either incision site. He is intact to light touch, he does report some slight loss of sensation  along the distal aspect of the radial portion of the distal phalanx. Cap refills intact to each individual digit. Radial or pulse are intact at today's visit. A Band-Aid was applied over the stab incision along the radial aspect of this callus.  Imaging: None.  Impression: Suppurative tenosynovitis of flexor tendon of right hand [M65.141] Suppurative tenosynovitis of flexor tendon of right hand (primary encounter diagnosis) Infected foreign body of finger of right hand, sequela Felon of finger of right hand with lymphangitis  Plan:  1. Treatment options were discussed today with the patient. 2. Continue with doxycycline as prescribed. 3. The patient was instructed that he may begin to get the finger wet over the A1 pulley incision site. Sutures were removed at today's visit and benzoin and Steri-Strips were applied. He was instructed to keep the stab incision dry until Friday. 4. Begin to massage to the index finger and work on continued range of motion. Gradually increase activities as tolerated. Referral for occupational therapy was placed with the patient. 5. The patient will follow up with me in 2 weeks for repeat skin check of the right hand. 6. They can call the clinic they have any questions, new symptoms develop or symptoms worsen.   PRECAUTIONS: None  WEIGHT BEARING RESTRICTIONS: No  PAIN:  Are you having pain?  Scar at the DIP of the second digit more weakness and tender. Over scar  FALLS: Has patient fallen in last 6 months? No  LIVING ENVIRONMENT: Lives with: Live with wife    PLOF: Had normal active range of motion and strength and right dominant hand.  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  PATIENT GOALS: Want the pain and scar tissue better as well as more motion to return to normal activity  NEXT MD VISIT: This week   OBJECTIVE:  Note: Objective measures were completed at  Evaluation unless otherwise noted.  HAND DOMINANCE: Left  ADLs: Cannot grip tight objects as well as pinching  FUNCTIONAL OUTCOME MEASURES: Next session  UPPER EXTREMITY ROM:     Active ROM Right eval Left eval  Shoulder flexion    Shoulder abduction    Shoulder adduction    Shoulder extension    Shoulder internal rotation    Shoulder external rotation    Elbow flexion    Elbow extension    Wrist flexion    Wrist extension    Wrist ulnar deviation    Wrist radial deviation    Wrist pronation    Wrist supination    (Blank rows = not tested)  Active ROM Right eval Left eval R 05/06/24  Thumb MCP (0-60)     Thumb IP (0-80)     Thumb Radial abd/add (0-55)      Thumb Palmar abd/add (0-45)      Thumb Opposition to Small Finger      Index MCP (0-90) 80  85 80  Index PIP (0-100) 90  100 95  Index DIP (0-70)  60  75 70  Long MCP (0-90)       Long PIP (0-100)       Long DIP (0-70)       Ring MCP (0-90)       Ring PIP (0-100)       Ring DIP (0-70)       Little MCP (0-90)       Little PIP (0-100)       Little DIP (0-70)       (Blank rows = not tested)  )  HAND FUNCTION: Grip strength: Right:   lbs; Left:   lbs, Lateral pinch: Right: 21 lbs, Left: 27 lbs, and 3 point pinch: Right: 11 lbs, Left: 21 lbs 2 point pinch R 7 lbs and L 16 lbs  06/06/24 Grip strength: Right:   lbs; Left:   lbs, Lateral pinch: Right: 21 lbs, Left: 27 lbs, and 3 point pinch: Right: 16 lbs, Left: 21 lbs; 2 point pinch R 11 lbs and L 16 lbs  COORDINATION: Decrease - tenderness at DIP of 2nd with pinching   SENSATION: Some numbness around incision at lateral DIP   EDEMA: proximal phalanges-R 6.9 cm and L 6.5 cm  COGNITION: Overall cognitive status: Within functional limits for tasks assessed      TREATMENT DATE: 06/13/24  Patient arrives with increased motion and  pinches increase by 1 lbs    Scar is improving but still tender and adhere over A1 pulley at ulnar side as well as  lateral DIP Report weakness in attempt of 2 point pinch and precision pinch side worse than pad/ on pad  Modalities: Fluidotherapy:  Time: Location: R wrist and hand  Decrease stiffness and tenderness prior to soft tissue massage and scar massage and motion  Patient do continue with contrast to decrease edema and pain and stiffness prior to home exercises to 3 times a day Handout provided and review  Focus on scar massage this date - used  mini massager  but then change to using coban for traction with scar mobs to  lateral DIP scar - all directions and circle As well as on ulnar side of palmar scar - great progress and response  Add to HEP but REINFORCE not to slide but keep traction while doing - 3 x day  Provided patient with a new  Cica -Care scar pad to use at nighttime   Can stop wearing silicone compression sleeve for digit at nighttime to see if decrease stiffness  Followed by [facility] Iv flexion with PIP extension 10 reps Gentle passive range of motion to DIP/PIP flexion 10 reps Followed by blocked intrinsic a fist 10 reps Followed by composite flexion 10 reps  Upgrade to med firm green putty provided for patient reviewed with patient gripping as well as lateral pinch and precision pinch.  Tolerated well.   2 to 3 sets over hte next week 12-15 reps  Need mod v/c for correct technique and pressure  Also provided pt for the week 8 lbs clothing pin for 2 point pinch  And rubber band for ext of digits around DIP and thumb - 12 reps  2-3 x day pain free       PATIENT EDUCATION: Education details: findings of eval and HEP  Person educated: Patient Education method: Explanation, Demonstration, Tactile cues, Verbal cues, and Handouts Education comprehension: verbalized understanding, returned demonstration, verbal cues required, and needs further education    GOALS: Goals reviewed with patient? Yes   LONG TERM GOALS: Target date: 6 wks  Patient be independent in home program to decrease scar tissue and tenderness and pain for patient to tolerate pressure with lateral pinch and 2 point pinch Baseline: 5/10 pain and tenderness over the scar with massage as well as lateral and precision pinch-scar tender and thick Goal status: INITIAL  2.  Right second digit flexion improved to within normal range compared to the left hand symptom-free for patient to play guitar type shoes do buttons Baseline: Patient has MP flexion at 80 PIP 90 and DIP 60 degrees with increased discomfort and tightness.  With pressure use pain can increase to 5/10 Goal status: INITIAL  3.  Right 3-point pinch and 2-point pinch improved to within normal range for patient to do buttons, play guitar open Ziploc bags or packages symptom-free Baseline: 3-point pinch right 11 pounds and left 21 pounds, 2-point pinch right 7 pounds left 16 pounds with discomfort increased to 5/10 Goal status: INITIAL  ASSESSMENT:  CLINICAL IMPRESSION: Patient seen today for occupational therapy evaluation for right second digit cellulitis with the irrigation and debridement on 05/05/2024 by Dr. Edie -patient with increased stiffness as well as increase scar tissue and tenderness that can increase to 5/10 especially with pressure on the DIP scar during 3 point and 2 point precision pinch.  Tenderness of  the scar as well as thick scar tissue over the A1 pulley of the second and lateral DIP of second.  Patient is left-hand dominant.  NOW patient to increase active range of motion of second digit.  Patient palmar scar improving.  Patient still tender and adhered at ulnar side of palmar scar as well as lateral DIP.  Change scar mobilization for patient to use piece of Coban.  Reinforced with him not to slide but keep it static and scar mobilization all directions.  Patient showed great progress in session.   Also upgrade patient to green for medium putty focusing on precision pinch with home exercises.  As well as extension of second digits. Pt cont to have increase scar tissue with increased pain and stiffness limiting patient's functional use especially in small task like buttons small opening small packages, playing guitar, cutting with utensils.  Patient can benefit from skilled OT services to decrease edema ,scar tissue and pain and increase motion and strength to return to prior level of function.  PERFORMANCE DEFICITS: in functional skills including ADLs, IADLs, ROM, strength, pain, flexibility, decreased knowledge of use of DME, and UE functional use,   and psychosocial skills including environmental adaptation and routines and behaviors.   IMPAIRMENTS: are limiting patient from ADLs, IADLs, rest and sleep, play, leisure, and social participation.   COMORBIDITIES: has no other co-morbidities that affects occupational performance. Patient will benefit from skilled OT to address above impairments and improve overall function.  MODIFICATION OR ASSISTANCE TO COMPLETE EVALUATION: No modification of tasks or assist necessary to complete an evaluation.  OT OCCUPATIONAL PROFILE AND HISTORY: Problem focused assessment: Including review of records relating to presenting problem.  CLINICAL DECISION MAKING: LOW - limited treatment options, no task modification necessary  REHAB POTENTIAL: Good for goals  EVALUATION COMPLEXITY: Low    PLAN:  OT FREQUENCY: 1x/week  OT DURATION: 6 weeks  PLANNED INTERVENTIONS: 97168 OT Re-evaluation, 97535 self care/ADL training, 02889 therapeutic exercise, 97530 therapeutic activity, 97112 neuromuscular re-education, 97140 manual therapy, 97035 ultrasound, 97018 paraffin, 02960 fluidotherapy, 97034 contrast bath, 97760 Orthotic Initial, [id number] Orthotic/Prosthetic subsequent, scar mobilization, passive range of motion, patient/family education, and DME and/or AE  instructions    CONSULTED AND AGREED WITH PLAN OF CARE: Patient     Ancel Peters, OTR/L,CLT 06/13/2024, 5:24 PM

## 2024-06-14 ENCOUNTER — Encounter

## 2024-06-14 DIAGNOSIS — Z951 Presence of aortocoronary bypass graft: Secondary | ICD-10-CM

## 2024-06-14 NOTE — Progress Notes (Signed)
 Daily Session Note  Patient Details  Name: Marcus Huang MRN: [id number] Date of Birth: 10-Oct-1950 Referring Provider:   Flowsheet Row Cardiac Rehab from 01/20/2024 in [facility] Cardiac and Pulmonary Rehab  Referring Provider Dr. Marsa Dooms    Encounter Date: 06/14/2024  Check In:  Session Check In - 06/14/24 0917       Check-In   Supervising physician immediately available to respond to emergencies See telemetry face sheet for immediately available ER MD    Location [facility]-Cardiac & Pulmonary Rehab    Staff Present Burnard Davenport RN,BSN,MPA;Maxon Conetta BS, Exercise Physiologist;Margaret Best, MS, Exercise Physiologist;Noah Tickle, BS, Exercise Physiologist;Jason Elnor RDN,LDN    Virtual Visit No    Medication changes reported     No    Fall or balance concerns reported    No    Tobacco Cessation No Change    Warm-up and Cool-down Performed on first and last piece of equipment    Resistance Training Performed Yes    VAD Patient? No    PAD/SET Patient? No      Pain Assessment   Currently in Pain? No/denies             Social History   Tobacco Use  Smoking Status Never  Smokeless Tobacco Never    Goals Met:  Proper associated with RPD/PD & O2 Sat Independence with exercise equipment Exercise tolerated well No report of concerns or symptoms today Strength training completed today  Goals Unmet:  Not Applicable  Comments: Pt able to follow exercise prescription today without complaint.  Will continue to monitor for progression.    Dr. Oneil Pinal is Medical Director for [facility] Cardiac Rehabilitation.  Dr. Fuad Aleskerov is Medical Director for [facility] Pulmonary Rehabilitation.

## 2024-06-16 ENCOUNTER — Encounter: Attending: Cardiology | Admitting: Emergency Medicine

## 2024-06-16 DIAGNOSIS — Z955 Presence of coronary angioplasty implant and graft: Secondary | ICD-10-CM | POA: Diagnosis not present

## 2024-06-16 DIAGNOSIS — Z951 Presence of aortocoronary bypass graft: Secondary | ICD-10-CM | POA: Diagnosis not present

## 2024-06-16 NOTE — Progress Notes (Signed)
 Daily Session Note  Patient Details  Name: Marcus Huang MRN: [id number] Date of Birth: [birth year] Referring Provider:   Flowsheet Row Cardiac Rehab from 01/20/2024 in [facility] Cardiac and Pulmonary Rehab  Referring Provider Dr. Marsa Dooms    Encounter Date: 06/16/2024  Check In:  Session Check In - 06/16/24 1002       Check-In   Supervising physician immediately available to respond to emergencies See telemetry face sheet for immediately available ER MD    Location [facility]-Cardiac & Pulmonary Rehab    Staff Present Rollene Paterson, MS, Exercise Physiologist;Henrik Orihuela RN,BSN;Joseph [employer] BS, Exercise Physiologist    Virtual Visit No    Medication changes reported     No    Fall or balance concerns reported    No    Tobacco Cessation No Change    Warm-up and Cool-down Performed on first and last piece of equipment    Resistance Training Performed Yes    VAD Patient? No    PAD/SET Patient? No      Pain Assessment   Currently in Pain? No/denies             Social History   Tobacco Use  Smoking Status Never  Smokeless Tobacco Never    Goals Met:  Independence with exercise equipment Exercise tolerated well No report of concerns or symptoms today Strength training completed today  Goals Unmet:  Not Applicable  Comments: Pt able to follow exercise prescription today without complaint.  Will continue to monitor for progression.    Dr. Oneil Pinal is Medical Director for [facility] Cardiac Rehabilitation.  Dr. Fuad Aleskerov is Medical Director for [facility] Pulmonary Rehabilitation.

## 2024-06-20 ENCOUNTER — Ambulatory Visit: Attending: Student | Admitting: Occupational Therapy

## 2024-06-20 DIAGNOSIS — M79641 Pain in right hand: Secondary | ICD-10-CM | POA: Diagnosis not present

## 2024-06-20 DIAGNOSIS — M25641 Stiffness of right hand, not elsewhere classified: Secondary | ICD-10-CM | POA: Diagnosis not present

## 2024-06-20 DIAGNOSIS — M6281 Muscle weakness (generalized): Secondary | ICD-10-CM | POA: Diagnosis not present

## 2024-06-20 DIAGNOSIS — L905 Scar conditions and fibrosis of skin: Secondary | ICD-10-CM | POA: Diagnosis not present

## 2024-06-20 NOTE — Therapy (Signed)
 OUTPATIENT OCCUPATIONAL THERAPY ORTHO TREATMENT  Patient Name: Marcus Huang MRN: [id number] DOB:July 05, 1951, 73 y.o., male Today's Date: 06/20/2024  PCP: Dr Kip MART PROVIDER: Kip PA  END OF SESSION:  OT End of Session - 06/20/24 0821     Visit Number 4    Number of Visits 6    Date for Recertification  07/11/24    OT Start Time 0821    OT Stop Time 0901    OT Time Calculation (min) 40 min    Activity Tolerance Patient tolerated treatment well    Behavior During Therapy [facility] for tasks assessed/performed          Past Medical History:  Diagnosis Date   Carotid artery thrombosis    Left Carotid Artery   Coronary artery disease    Diabetes (HCC)    Diverticulosis    History of kidney stones    Hyperlipidemia    Hypertension    Renal mass    Sleep apnea    C-PAP   Past Surgical History:  Procedure Laterality Date   COLONOSCOPY WITH PROPOFOL  N/A 08/15/2019   Procedure: COLONOSCOPY WITH PROPOFOL ;  Surgeon: Jinny Carmine, MD;  Location: [facility];  Service: Endoscopy;  Laterality: N/A;  sleep apnea   CORONARY ARTERY BYPASS GRAFT N/A 12/10/2023   Procedure: CORONARY ARTERY BYPASS GRAFTING (CABG) xTHREE USING LEFT INTERNAL MAMMARY ARTERY AND ENDOSCOPICALLY HARVESTED RIGHT GREATER SAPPHENOUS VEIN;  Surgeon: Lucas Dorise POUR, MD;  Location: [facility] OR;  Service: Open Heart Surgery;  Laterality: N/A;   CORONARY STENT INTERVENTION N/A 08/05/2022   Procedure: CORONARY STENT INTERVENTION;  Surgeon: Ammon Blunt, MD;  Location: [facility];  Service: Cardiovascular;  Laterality: N/A;   CORONARY STENT INTERVENTION Left 08/28/2022   Procedure: CORONARY STENT INTERVENTION;  Surgeon: Ammon Blunt, MD;  Location: [facility];  Service: Cardiovascular;  Laterality: Left;   EYE SURGERY Bilateral    LASIK EYE SURGERY   INCISION AND DRAINAGE OF WOUND Right 05/05/2024   Procedure: IRRIGATION AND DEBRIDEMENT WOUND RIGHT INDEX FINGER, EXPLORATION  WITH IRRIGATION AND DEBRIDEMENT OF FLEXOR SHEATH WITH RELEASE OF A-1 PULLEY;  Surgeon: Edie Norleen PARAS, MD;  Location: [facility] ORS;  Service: Orthopedics;  Laterality: Right;  flexor sheath right index   INGUINAL HERNIA REPAIR Bilateral 05/12/2017   Procedure: LAPAROSCOPIC BILATERAL INGUINAL HERNIA REPAIR, umbilical hernia repair;  Surgeon: Wonda Charlie BRAVO, MD;  Location: [facility] ORS;  Service: General;  Laterality: Bilateral;   INTRAOPERATIVE TRANSESOPHAGEAL ECHOCARDIOGRAM N/A 12/10/2023   Procedure: ECHOCARDIOGRAM, TRANSESOPHAGEAL, INTRAOPERATIVE;  Surgeon: Lucas Dorise POUR, MD;  Location: [facility];  Service: Open Heart Surgery;  Laterality: N/A;   LEFT HEART CATH AND CORONARY ANGIOGRAPHY N/A 08/05/2022   Procedure: LEFT HEART CATH AND CORONARY ANGIOGRAPHY;  Surgeon: Ammon Blunt, MD;  Location: [facility];  Service: Cardiovascular;  Laterality: N/A;   LEFT HEART CATH AND CORONARY ANGIOGRAPHY Left 11/11/2023   Procedure: LEFT HEART CATH AND CORONARY ANGIOGRAPHY;  Surgeon: Ammon Blunt, MD;  Location: [facility];  Service: Cardiovascular;  Laterality: Left;   TONSILLECTOMY     As a child   Patient Active Problem List   Diagnosis Date Noted   Cellulitis of finger of right hand 05/05/2024   Cellulitis 05/05/2024   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   Atherosclerosis of native arteries of extremity with intermittent claudication 10/12/2022   Lymphedema 08/24/2022   Raynaud's disease 08/24/2022   PAD (peripheral artery disease) 08/24/2022   S/P drug eluting coronary stent placement  08/15/2022   CAD (coronary artery disease) 08/05/2022   Screen for colon cancer 03/14/2020   Special screening for malignant neoplasms, colon    Peyronie's disease 07/16/2019   Renal mass 07/10/2019   Heart palpitations 06/27/2019   Carotid artery stenosis 06/02/2019   Umbilical hernia without obstruction and without gangrene 06/02/2019   Aortic atherosclerosis 06/02/2019   Hypertensive urgency  05/31/2019   Non-recurrent bilateral inguinal hernia without obstruction or gangrene    Diabetes type 2, uncontrolled 02/14/2014   Hyperlipidemia, unspecified 02/14/2014   Malignant hypertension 02/14/2014   Type 2 diabetes mellitus without complication, without long-term current use of insulin  (HCC) 02/14/2014   Hyperlipidemia 02/14/2014   Hypertension, essential, benign 02/14/2014    ONSET DATE: 05/05/24  REFERRING DIAG: R hand cellulitis with I&D 2nd digit and palm   THERAPY DIAG:  Stiffness of right hand, not elsewhere classified  Scar tissue  Pain in right hand  Muscle weakness (generalized)  Rationale for Evaluation and Treatment: Rehabilitation  SUBJECTIVE:   SUBJECTIVE STATEMENT: My fingers little more sore since have seen you as well as increased swelling of the finger.  I worked the scar.  A lot when I am just sitting during the day and thinking about it.  And then the putty I done. Pt accompanied by: self  PERTINENT HISTORY: 05/17/24 Ortho not :  The patient presents today with a dressing applied to the right hand. Dressing was removed at today's visit. Skin examination of the right hand demonstrates a well-healing incision at the base of the finger on where the entire A1 pulley was released. Sutures are intact without any evidence of loosening. The sutures were removed today without complication. Benzoin and Steri-Strips were applied. The patient has a well-healed stab type incision along the radial aspect of the distal phalanx without any erythema. The patient does still have some mild swelling throughout the finger however wrinkles are beginning to appear. He denies any significant tenderness to palpation along the volar or dorsal aspect of the finger at this time. He is able to fully extend the finger, limitations with flexion due to the continued swelling but no pain with passive flexion or extension of the finger. There is no purulent drainage coming from either incision  site. He is intact to light touch, he does report some slight loss of sensation along the distal aspect of the radial portion of the distal phalanx. Cap refills intact to each individual digit. Radial or pulse are intact at today's visit. A Band-Aid was applied over the stab incision along the radial aspect of this callus.  Imaging: None.  Impression: Suppurative tenosynovitis of flexor tendon of right hand [M65.141] Suppurative tenosynovitis of flexor tendon of right hand (primary encounter diagnosis) Infected foreign body of finger of right hand, sequela Felon of finger of right hand with lymphangitis  Plan:  1. Treatment options were discussed today with the patient. 2. Continue with doxycycline as prescribed. 3. The patient was instructed that he may begin to get the finger wet over the A1 pulley incision site. Sutures were removed at today's visit and benzoin and Steri-Strips were applied. He was instructed to keep the stab incision dry until Friday. 4. Begin to massage to the index finger and work on continued range of motion. Gradually increase activities as tolerated. Referral for occupational therapy was placed with the patient. 5. The patient will follow up with me in 2 weeks for repeat skin check of the right hand. 6. They can call the clinic they  have any questions, new symptoms develop or symptoms worsen.   PRECAUTIONS: None     WEIGHT BEARING RESTRICTIONS: No  PAIN:  Are you having pain?  3/10 pain with flexion of DIP and PIP as well as composite flexion at lateral DIP PIP  FALLS: Has patient fallen in last 6 months? No  LIVING ENVIRONMENT: Lives with: Live with wife    PLOF: Had normal active range of motion and strength and right dominant hand.  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  PATIENT GOALS: Want the pain and scar tissue better as well as more motion to return to normal  activity  NEXT MD VISIT: This week   OBJECTIVE:  Note: Objective measures were completed at Evaluation unless otherwise noted.  HAND DOMINANCE: Left  ADLs: Cannot grip tight objects as well as pinching  FUNCTIONAL OUTCOME MEASURES: Next session  UPPER EXTREMITY ROM:     Active ROM Right eval Left eval  Shoulder flexion    Shoulder abduction    Shoulder adduction    Shoulder extension    Shoulder internal rotation    Shoulder external rotation    Elbow flexion    Elbow extension    Wrist flexion    Wrist extension    Wrist ulnar deviation    Wrist radial deviation    Wrist pronation    Wrist supination    (Blank rows = not tested)  Active ROM Right eval Left eval R 05/06/24  Thumb MCP (0-60)     Thumb IP (0-80)     Thumb Radial abd/add (0-55)      Thumb Palmar abd/add (0-45)      Thumb Opposition to Small Finger      Index MCP (0-90) 80  85 80  Index PIP (0-100) 90  100 95  Index DIP (0-70)  60  75 70  Long MCP (0-90)       Long PIP (0-100)       Long DIP (0-70)       Ring MCP (0-90)       Ring PIP (0-100)       Ring DIP (0-70)       Little MCP (0-90)       Little PIP (0-100)       Little DIP (0-70)       (Blank rows = not tested)  )  HAND FUNCTION: Grip strength: Right:   lbs; Left:   lbs, Lateral pinch: Right: 21 lbs, Left: 27 lbs, and 3 point pinch: Right: 11 lbs, Left: 21 lbs 2 point pinch R 7 lbs and L 16 lbs  06/06/24 Grip strength: Right:   lbs; Left:   lbs, Lateral pinch: Right: 21 lbs, Left: 27 lbs, and 3 point pinch: Right: 16 lbs, Left: 21 lbs; 2 point pinch R 11 lbs and L 16 lbs  COORDINATION: Decrease - tenderness at DIP of 2nd with pinching   SENSATION: Some numbness around incision at lateral DIP   EDEMA: proximal phalanges-R 6.9 cm and L 6.5 cm  COGNITION: Overall cognitive status: Within functional limits for tasks assessed      TREATMENT DATE: 06/20/24  Patient arrived with increased pain and edema in second digit into the second metacarpal. Increased pain with DIP/PIP flexion composite flexion Scar adhesions improved greatly but increased tenderness. Reviewed with patient to only do scar mobilization 2-3 times a day for about 2 minutes. And to hold off on putty lateral pinch and 2-point pinch with green putty  Patient to focus in the next 24 to 48 hours on contrast as well as wearing silicone compression sleeves and gentle tendon glides  After which he can continue with scar mobilization but only to 3 times a day 2 minutes Tendon glides And use of light putty first for a day or 2 or until the end of this week and then switch back to harder putty Modalities: Fluidotherapy:  Time: Location: R wrist and hand  Use fluidotherapy heat for 3 rotations and ice 1 minutes in between for contrast to facilitate decreased pain and stiffness  Patient do continue with contrast to decrease edema and pain and stiffness prior to home exercises to 3 times a day Handout provided and review  Patient needed mod verbal cueing to focus on tendon glides-motion pain-free not forcing it Followed by [facility] flexion with PIP extension 10 reps Intrinsic a fist- DIP/PIP flexion 10 reps Followed by composite flexion 10 reps  Patient to start back end of the week pain-free with lighter putty and then Next week go to green putty again for gripping and 2-point pinch Can gradually after that initiate again 8 pounds closed independent for 3 point       PATIENT EDUCATION: Education details: findings of eval and HEP  Person educated: Patient Education method: Explanation, Demonstration, Tactile cues, Verbal cues, and Handouts Education comprehension: verbalized understanding, returned demonstration, verbal cues required, and needs further education   GOALS: Goals reviewed with patient? Yes   LONG TERM GOALS:  Target date: 6 wks  Patient be independent in home program to decrease scar tissue and tenderness and pain for patient to tolerate pressure with lateral pinch and 2 point pinch Baseline: 5/10 pain and tenderness over the scar with massage as well as lateral and precision pinch-scar tender and thick Goal status: INITIAL  2.  Right second digit flexion improved to within normal range compared to the left hand symptom-free for patient to play guitar type shoes do buttons Baseline: Patient has MP flexion at 80 PIP 90 and DIP 60 degrees with increased discomfort and tightness.  With pressure use pain can increase to 5/10 Goal status: INITIAL  3.  Right 3-point pinch and 2-point pinch improved to within normal range for patient to do buttons, play guitar open Ziploc bags or packages symptom-free Baseline: 3-point pinch right 11 pounds and left 21 pounds, 2-point pinch right 7 pounds left 16 pounds with discomfort increased to 5/10 Goal status: INITIAL  ASSESSMENT:  CLINICAL IMPRESSION: Patient seen today for occupational therapy evaluation for right second digit cellulitis with the irrigation and debridement on 05/05/2024 by Dr. Edie -patient with increased stiffness as well as increase scar tissue and tenderness that can increase to 5/10 especially with pressure on the DIP scar during 3 point and 2 point precision pinch.  Tenderness of the scar as well as thick scar tissue over the A1 pulley of the second and lateral DIP of second.  Patient is left-hand dominant.  NOW patient made great progress from start of care.  With increased edema second digit as well as pain and tenderness with intrinsic assist and composite with pain on the lateral  DIP PIP.  Reviewed with patient do not overdo his soft tissue and scar massage as well as putty.  Patient to focus the next 24 to 48 hours of decreasing pain and edema.  Scar tissue improved greatly.  Patient to hold off on strengthening until the end of the week  focusing on only tendon glides.  Can initiate end of the week with a lighter harder putty again.  Patient scar tissues improving.  Pt cont to have increase scar tissue with increased pain and stiffness limiting patient's functional use especially in small task like buttons small opening small packages, playing guitar, cutting with utensils.  Patient can benefit from skilled OT services to decrease edema ,scar tissue and pain and increase motion and strength to return to prior level of function.  PERFORMANCE DEFICITS: in functional skills including ADLs, IADLs, ROM, strength, pain, flexibility, decreased knowledge of use of DME, and UE functional use,   and psychosocial skills including environmental adaptation and routines and behaviors.   IMPAIRMENTS: are limiting patient from ADLs, IADLs, rest and sleep, play, leisure, and social participation.   COMORBIDITIES: has no other co-morbidities that affects occupational performance. Patient will benefit from skilled OT to address above impairments and improve overall function.  MODIFICATION OR ASSISTANCE TO COMPLETE EVALUATION: No modification of tasks or assist necessary to complete an evaluation.  OT OCCUPATIONAL PROFILE AND HISTORY: Problem focused assessment: Including review of records relating to presenting problem.  CLINICAL DECISION MAKING: LOW - limited treatment options, no task modification necessary  REHAB POTENTIAL: Good for goals  EVALUATION COMPLEXITY: Low    PLAN:  OT FREQUENCY: 1x/week  OT DURATION: 6 weeks  PLANNED INTERVENTIONS: 97168 OT Re-evaluation, 97535 self care/ADL training, 02889 therapeutic exercise, 97530 therapeutic activity, 97112 neuromuscular re-education, 97140 manual therapy, 97035 ultrasound, 97018 paraffin, 02960 fluidotherapy, 97034 contrast bath, 97760 Orthotic Initial, [id number] Orthotic/Prosthetic subsequent, scar mobilization, passive range of motion, patient/family education, and DME and/or AE  instructions    CONSULTED AND AGREED WITH PLAN OF CARE: Patient     Ancel Peters, OTR/L,CLT 06/20/2024, 1:31 PM

## 2024-06-21 ENCOUNTER — Encounter

## 2024-06-21 DIAGNOSIS — Z951 Presence of aortocoronary bypass graft: Secondary | ICD-10-CM

## 2024-06-21 DIAGNOSIS — Z955 Presence of coronary angioplasty implant and graft: Secondary | ICD-10-CM

## 2024-06-21 NOTE — Progress Notes (Signed)
 Discharge Summary Patient: Marcus Huang DOB: 1950/11/27  Marcus Huang graduated today from  rehab with 36 sessions completed.  Details of the patient's exercise prescription and what He needs to do in order to continue the prescription and progress were discussed with patient.  Patient was given a copy of prescription and goals.  Patient verbalized understanding. Marcus Huang plans to continue to exercise by walking and using hand weights at home.   6 Minute Walk     Row Name 01/20/24 0924 05/26/24 0928       6 Minute Walk   Phase Initial Discharge    Distance 1560 feet 1690 feet    Distance % Change -- 8.3 %    Distance Feet Change -- 130 ft    Walk Time 6 minutes 6 minutes    # of Rest Breaks 0 0    MPH 2.95 3.2    METS 3.32 3.51    RPE 8 10    Perceived Dyspnea  0 0    VO2 Peak 11.63 12.3    Symptoms No No    Resting HR 46 bpm 51 bpm    Resting BP 124/64 132/76    Resting Oxygen Saturation  100 % 96 %    Exercise Oxygen Saturation  during 6 min walk 99 % 93 %    Max Ex. HR 72 bpm 84 bpm    Max Ex. BP 148/66 138/68    2 Minute Post BP 140/70 --

## 2024-06-21 NOTE — Progress Notes (Signed)
 Cardiac Individual Treatment Plan  Patient Details  Name: Marcus Huang MRN: [id number] Date of Birth: Jul 09, 1951 Referring Provider:   Flowsheet Row Cardiac Rehab from 01/20/2024 in [facility] Cardiac and Pulmonary Rehab  Referring Provider Dr. 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 (Patient not taking: Reported on 05/05/2024), Disp: , Rfl:    amiodarone  (PACERONE ) 200 MG tablet, 200 mg., Disp: , Rfl:    amLODipine  (NORVASC ) 10 MG tablet, Take 1 tablet by mouth daily., Disp: , Rfl:    aspirin  EC 81 MG tablet, Take 81 mg by mouth daily. Swallow whole., Disp: , Rfl:    chlorhexidine  (HIBICLENS ) 4 % external liquid, Apply 15 mLs (1 Application total) topically as directed for 30 doses. Use as directed daily for 5 days every other week for 6 weeks., Disp: 946 mL, Rfl: 1   Continuous Glucose Sensor (FREESTYLE LIBRE 3 PLUS SENSOR) MISC, FOLLOW PACKAGE DIRECTIONS CHANGE EVERY 15 DAYS, Disp: , Rfl:    lisinopril  (ZESTRIL ) 5 MG tablet, Take 1 tablet (5 mg total) by mouth daily., Disp: 30 tablet, Rfl: 1   metoprolol  tartrate (LOPRESSOR ) 50 MG tablet, Take 1 tablet (50 mg total) by mouth 2 (two) times daily. (Patient taking differently: Take 25 mg by mouth 2 (two) times daily. Take half in the morning and half in the evening), Disp: 60 tablet, Rfl: 1   Omega-3 Fatty Acids (OMEGA 3 500 PO), Take 500 mg by mouth daily. (Patient not taking: Reported on 05/05/2024), Disp: , Rfl:    ONETOUCH ULTRA TEST test strip, 1 each 3 (three) times daily., Disp: , Rfl:    PREVIDENT 5000 SENSITIVE 1.1-5 % GEL, Place 1 Application  onto teeth 2 (two) times daily., Disp: , Rfl:    REPATHA SURECLICK 140 MG/ML SOAJ, Inject 1 mL into the skin every 14 (fourteen) days., Disp: , Rfl:   Past Medical History: Past Medical History:  Diagnosis Date   Carotid artery thrombosis    Left Carotid Artery   Coronary artery disease    Diabetes (HCC)    Diverticulosis    History of kidney stones    Hyperlipidemia    Hypertension    Renal mass    Sleep apnea    C-PAP    Tobacco Use: Social History   Tobacco Use  Smoking Status Never  Smokeless Tobacco Never    Labs: Review Flowsheet       Latest Ref Rng & Units 12/08/2023 12/10/2023 12/12/2023  Labs for ITP Cardiac and Pulmonary Rehab  Hemoglobin A1c 4.8 - 5.6 % 5.6  - -  PH, Arterial 7.35 - 7.45 - 7.317  7.356  7.386  7.419  7.416  7.298  7.387  7.376   PCO2 arterial 32 - 48 mmHg - 43.3  41.6  37.1  33.4  35.2  51.0  39.0  35.8   Bicarbonate 20.0 - 28.0 mmol/L - 22.0  23.2  22.7  21.6  22.6  23.1  25.0  23.5  20.9   TCO2 22 - 32 mmol/L - 23  24  24  21  23   23  24  25  24  27  25  24  25  22    Acid-base deficit 0.0 - 2.0 mmol/L - 4.0  2.0  3.0  3.0  2.0  3.0  2.0  1.0  4.0   O2 Saturation % - 93  93  97  100  100  87  100  100  91     Details       Multiple values from one day are sorted in reverse-chronological order          Exercise Target Goals: Exercise Program Goal: Individual exercise prescription set using results from initial 6 min walk test and THRR while considering  patient's activity barriers and safety.   Exercise Prescription Goal: Initial exercise prescription builds to 30-45 minutes a day of aerobic activity, 2-3 days per week.  Home exercise guidelines will be given to patient during program as part of exercise prescription that the participant will acknowledge.   Education: Aerobic Exercise: - Group verbal and visual presentation on the components of exercise prescription. Introduces F.I.T.T principle from ACSM for exercise  prescriptions.  Reviews F.I.T.T. principles of aerobic exercise including progression. Written material provided at class time. Flowsheet Row Cardiac Rehab from 03/31/2024 in [facility] Cardiac and Pulmonary Rehab  Education need identified 01/20/24    Education: Resistance Exercise: - Group verbal and visual presentation on the components of exercise prescription. Introduces F.I.T.T principle from ACSM for exercise prescriptions  Reviews F.I.T.T. principles of resistance exercise including progression. Written material provided at class time. Flowsheet Row Cardiac Rehab from 11/06/2022 in [facility] Cardiac and Pulmonary Rehab  Date 10/09/22  Educator [facility]  Instruction Review Code 1- Verbalizes Understanding     Education: Exercise & Equipment Safety: - Individual verbal instruction and demonstration of equipment use and safety with use of the equipment. Flowsheet Row Cardiac Rehab from 03/31/2024 in [facility] Cardiac and Pulmonary Rehab  Date 01/18/24  Educator jh  Instruction Review Code 1- Verbalizes Understanding    Education: Exercise Physiology & General Exercise Guidelines: - Group verbal and written instruction with models to review the exercise physiology of the cardiovascular system and associated critical values. Provides general exercise guidelines with specific guidelines to those with heart or lung disease. Written material provided at class time.   Education: Flexibility, Balance, Mind/Body Relaxation: - Group verbal and visual presentation with interactive activity on the components of exercise prescription. Introduces F.I.T.T principle from ACSM for exercise prescriptions. Reviews F.I.T.T. principles of flexibility and balance exercise training including progression. Also discusses the mind body connection.  Reviews various relaxation techniques to help reduce and manage stress (i.e. Deep breathing, progressive muscle relaxation, and visualization). Balance handout provided to take home.  Written material provided at class time. Flowsheet Row Cardiac Rehab from 11/06/2022 in [facility] Cardiac and Pulmonary Rehab  Date 10/16/22  Educator [facility]  Instruction Review Code 1- Verbalizes Understanding    Activity Barriers & Risk Stratification:  Activity Barriers & Cardiac Risk Stratification - 01/20/24 9077       Activity Barriers & Cardiac Risk Stratification   Activity Barriers None;Other (comment)    Comments lifting restrictions <10 lb    Cardiac Risk Stratification High          6 Minute Walk:  6 Minute Walk     Row Name 01/20/24 0924 05/26/24 0928       6 Minute Walk   Phase Initial Discharge    Distance 1560 feet 1690 feet    Distance % Change -- 8.3 %  Distance Feet Change -- 130 ft    Walk Time 6 minutes 6 minutes    # of Rest Breaks 0 0    MPH 2.95 3.2    METS 3.32 3.51    RPE 8 10    Perceived Dyspnea  0 0    VO2 Peak 11.63 12.3    Symptoms No No    Resting HR 46 bpm 51 bpm    Resting BP 124/64 132/76    Resting Oxygen Saturation  100 % 96 %    Exercise Oxygen Saturation  during 6 min walk 99 % 93 %    Max Ex. HR 72 bpm 84 bpm    Max Ex. BP 148/66 138/68    2 Minute Post BP 140/70 --       Oxygen Initial Assessment:   Oxygen Re-Evaluation:   Oxygen Discharge (Final Oxygen Re-Evaluation):   Initial Exercise Prescription:  Initial Exercise Prescription - 01/20/24 0900       Date of Initial Exercise RX and Referring Provider   Date 01/20/24    Referring Provider Dr. Marsa Dooms      Oxygen   Maintain Oxygen Saturation 88% or higher      Treadmill   MPH 2.8    Grade 1    Minutes 15    METs 3.53      Elliptical   Level 1    Speed 3    Minutes 15    METs 3.32      REL-XR   Level 3    Speed 50    Minutes 15    METs 3.32      Prescription Details   Frequency (times per week) 2    Duration Progress to 30 minutes of continuous aerobic without signs/symptoms of physical distress      Intensity   THRR 40-80% of Max  Heartrate 87-129    Ratings of Perceived Exertion 11-13    Perceived Dyspnea 0-4      Progression   Progression Continue to progress workloads to maintain intensity without signs/symptoms of physical distress.      Resistance Training   Training Prescription Yes    Weight 4 lb    Reps 10-15          Perform Capillary Blood Glucose checks as needed.  Exercise Prescription Changes:   Exercise Prescription Changes     Row Name 01/20/24 0900 02/04/24 1400 02/18/24 1600 03/03/24 1600 03/08/24 0900     Response to Exercise   Blood Pressure (Admit) 124/64 110/60 122/62 118/62 --   Blood Pressure (Exercise) 148/66 136/64 144/76 148/60 --   Blood Pressure (Exit) 140/70 108/58 114/66 124/66 --   Heart Rate (Admit) 46 bpm 43 bpm 53 bpm 68 bpm --   Heart Rate (Exercise) 72 bpm 107 bpm 91 bpm 93 bpm --   Heart Rate (Exit) 48 bpm 50 bpm 62 bpm 50 bpm --   Oxygen Saturation (Admit) 100 % -- -- -- --   Oxygen Saturation (Exercise) 99 % -- -- -- --   Rating of Perceived Exertion (Exercise) 8 13 14 12  --   Perceived Dyspnea (Exercise) 0 0 -- -- --   Symptoms none none none none --   Comments Results first two weeks of exercise -- -- --   Duration -- Progress to 30 minutes of  aerobic without signs/symptoms of physical distress Continue with 30 min of aerobic exercise without signs/symptoms of physical distress. Continue with 30 min of  aerobic exercise without signs/symptoms of physical distress. Continue with 30 min of aerobic exercise without signs/symptoms of physical distress.   Intensity -- THRR unchanged THRR unchanged THRR unchanged THRR unchanged     Progression   Progression -- Continue to progress workloads to maintain intensity without signs/symptoms of physical distress. Continue to progress workloads to maintain intensity without signs/symptoms of physical distress. Continue to progress workloads to maintain intensity without signs/symptoms of physical distress. Continue to  progress workloads to maintain intensity without signs/symptoms of physical distress.   Average METs -- 4.02 4.27 4.47 4.47     Resistance Training   Training Prescription -- Yes Yes Yes Yes   Weight -- 4lb 4 lb 10lb 10lb   Reps -- 10-15 10-15 10-15 10-15     Interval Training   Interval Training -- No No No No     Treadmill   MPH -- 2.9 3 3 3    Grade -- 2 4.5 4.5 4.5   Minutes -- 15 15 15 15    METs -- 4.02 5.16 5.16 5.16     Elliptical   Level -- -- 1 1 1    Speed -- -- 3 -- --   Minutes -- -- 15 15 15    METs -- -- 3.1 4.2 4.2     REL-XR   Level -- 5 6 5 5    Minutes -- 15 15 15 15    METs -- -- -- 3.6 3.6     Home Exercise Plan   Plans to continue exercise at -- -- -- -- Home (comment)  walking and hand weights.   Frequency -- -- -- -- Add 2 additional days to program exercise sessions.   Initial Home Exercises Provided -- -- -- -- 03/08/24     Oxygen   Maintain Oxygen Saturation -- 88% or higher 88% or higher 88% or higher 88% or higher    Row Name 03/15/24 1300 03/30/24 1300 04/12/24 0800 04/28/24 0900 05/12/24 1000     Response to Exercise   Blood Pressure (Admit) 102/60 126/58 124/62 122/60 126/62   Blood Pressure (Exit) 108/62 110/58 124/64 118/62 128/80   Heart Rate (Admit) 64 bpm 74 bpm 61 bpm 72 bpm 58 bpm   Heart Rate (Exercise) 99 bpm 97 bpm 120 bpm 119 bpm 119 bpm   Heart Rate (Exit) 62 bpm 66 bpm 77 bpm 73 bpm 69 bpm   Rating of Perceived Exertion (Exercise) 11 12 13 12 13    Symptoms none none none none none   Duration Continue with 30 min of aerobic exercise without signs/symptoms of physical distress. Continue with 30 min of aerobic exercise without signs/symptoms of physical distress. Continue with 30 min of aerobic exercise without signs/symptoms of physical distress. Continue with 30 min of aerobic exercise without signs/symptoms of physical distress. Continue with 30 min of aerobic exercise without signs/symptoms of physical distress.   Intensity THRR  unchanged THRR unchanged THRR unchanged THRR unchanged THRR unchanged     Progression   Progression Continue to progress workloads to maintain intensity without signs/symptoms of physical distress. Continue to progress workloads to maintain intensity without signs/symptoms of physical distress. Continue to progress workloads to maintain intensity without signs/symptoms of physical distress. Continue to progress workloads to maintain intensity without signs/symptoms of physical distress. Continue to progress workloads to maintain intensity without signs/symptoms of physical distress.   Average METs 4.86 4.92 5.25 5.35 5.05     Resistance Training   Training Prescription Yes Yes Yes Yes Yes  Weight 10 lb 10 lb 10 lb 10 lb 10 lb   Reps 10-15 10-15 10-15 10-15 10-15     Interval Training   Interval Training No No No No No     Treadmill   MPH 3 3 3 3  3.1   Grade 7 6.5 7 8 6    Minutes 15 15 15 15 15    METs 6.19 5.99 6.19 6.61 5.94     Elliptical   Level 3 3 3 3 3    Speed 6 0 0 4 3.6   Minutes 15 15 15 15 15    METs 5.5 4.1 4.5 4.1 4.1     REL-XR   Level 7 -- -- -- --   Minutes 15 -- -- -- --   METs 3.7 -- -- -- --     Rower   Level -- 5 7 6 6    Watts -- 25 44 51 30   Minutes -- 15 15 15 15    METs -- 4.23 4.23 4.22 4.23     Home Exercise Plan   Plans to continue exercise at Home (comment)  walking and hand weights. Home (comment)  walking and hand weights. Home (comment)  walking and hand weights. Home (comment)  walking and hand weights. Home (comment)  walking and hand weights.   Frequency Add 2 additional days to program exercise sessions. Add 2 additional days to program exercise sessions. Add 2 additional days to program exercise sessions. Add 2 additional days to program exercise sessions. Add 2 additional days to program exercise sessions.   Initial Home Exercises Provided 03/08/24 03/08/24 03/08/24 03/08/24 03/08/24     Oxygen   Maintain Oxygen Saturation 88% or higher 88%  or higher 88% or higher 88% or higher 88% or higher    Row Name 05/23/24 1500 06/09/24 1400           Response to Exercise   Blood Pressure (Admit) 120/66 122/64      Blood Pressure (Exit) 118/66 122/72      Heart Rate (Admit) 62 bpm 60 bpm      Heart Rate (Exercise) 97 bpm 111 bpm      Heart Rate (Exit) 61 bpm 70 bpm      Rating of Perceived Exertion (Exercise) 13 13      Symptoms none none      Duration Continue with 30 min of aerobic exercise without signs/symptoms of physical distress. Continue with 30 min of aerobic exercise without signs/symptoms of physical distress.      Intensity THRR unchanged THRR unchanged        Progression   Progression Continue to progress workloads to maintain intensity without signs/symptoms of physical distress. Continue to progress workloads to maintain intensity without signs/symptoms of physical distress.      Average METs 6.26 6.09        Resistance Training   Training Prescription Yes Yes      Weight 10 lb 10 lb      Reps 10-15 10-15        Interval Training   Interval Training No Yes      Equipment -- Elliptical      Comments -- 6 min intervals        Treadmill   MPH 3 3.2      Grade 8 7.5      Minutes 15 15      METs 6.61 6.76        Elliptical   Level 3 4  Speed 3.6 3.2      Minutes 15 15      METs -- 4.1        Home Exercise Plan   Plans to continue exercise at Home (comment)  walking and hand weights. Home (comment)  walking and hand weights.      Frequency Add 2 additional days to program exercise sessions. Add 2 additional days to program exercise sessions.      Initial Home Exercises Provided 03/08/24 03/08/24        Oxygen   Maintain Oxygen Saturation 88% or higher 88% or higher         Exercise Comments:   Exercise Comments     Row Name 01/28/24 1029 06/21/24 0916         Exercise Comments irst full day of exercise!  Patient was oriented to gym and equipment including functions, settings, policies, and  procedures.  Patient's individual exercise prescription and treatment plan were reviewed.  All starting workloads were established based on the results of the 6 minute walk test done at initial orientation visit.  The plan for exercise progression was also introduced and progression will be customized based on patient's performance and goals. Govind graduated today from  rehab with 36 sessions completed.  Details of the patient's exercise prescription and what He needs to do in order to continue the prescription and progress were discussed with patient.  Patient was given a copy of prescription and goals.  Patient verbalized understanding. Zaryan plans to continue to exercise by walking and using hand weights at home.         Exercise Goals and Review:   Exercise Goals     Row Name 01/20/24 0923             Exercise Goals   Increase Physical Activity Yes       Intervention Provide advice, education, support and counseling about physical activity/exercise needs.;Develop an individualized exercise prescription for aerobic and resistive training based on initial evaluation findings, risk stratification, comorbidities and participant's personal goals.       Expected Outcomes Short Term: Attend rehab on a regular basis to increase amount of physical activity.;Long Term: Add in home exercise to make exercise part of routine and to increase amount of physical activity.;Long Term: Exercising regularly at least 3-5 days a week.       Increase Strength and Stamina Yes       Intervention Provide advice, education, support and counseling about physical activity/exercise needs.;Develop an individualized exercise prescription for aerobic and resistive training based on initial evaluation findings, risk stratification, comorbidities and participant's personal goals.       Expected Outcomes Short Term: Increase workloads from initial exercise prescription for resistance, speed, and METs.;Short Term: Perform  resistance training exercises routinely during rehab and add in resistance training at home;Long Term: Improve cardiorespiratory fitness, muscular endurance and strength as measured by increased METs and functional capacity ( )       Able to understand and use rate of perceived exertion (RPE) scale Yes       Intervention Provide education and explanation on how to use RPE scale       Expected Outcomes Short Term: Able to use RPE daily in rehab to express subjective intensity level;Long Term:  Able to use RPE to guide intensity level when exercising independently       Able to understand and use Dyspnea scale Yes       Intervention Provide education and explanation on  how to use Dyspnea scale       Expected Outcomes Short Term: Able to use Dyspnea scale daily in rehab to express subjective sense of shortness of breath during exertion;Long Term: Able to use Dyspnea scale to guide intensity level when exercising independently       Knowledge and understanding of Target Heart Rate Range (THRR) Yes       Intervention Provide education and explanation of THRR including how the numbers were predicted and where they are located for reference       Expected Outcomes Short Term: Able to state/look up THRR;Long Term: Able to use THRR to govern intensity when exercising independently;Short Term: Able to use daily as guideline for intensity in rehab       Able to check pulse independently Yes       Intervention Provide education and demonstration on how to check pulse in carotid and radial arteries.;Review the importance of being able to check your own pulse for safety during independent exercise       Expected Outcomes Short Term: Able to explain why pulse checking is important during independent exercise       Understanding of Exercise Prescription Yes       Intervention Provide education, explanation, and written materials on patient's individual exercise prescription       Expected Outcomes Short Term: Able  to explain program exercise prescription;Long Term: Able to explain home exercise prescription to exercise independently          Exercise Goals Re-Evaluation :  Exercise Goals Re-Evaluation     Row Name 01/28/24 1029 02/04/24 1424 02/11/24 0941 02/18/24 1609 02/23/24 0939     Exercise Goal Re-Evaluation   Exercise Goals Review -- Increase Physical Activity;Increase Strength and Stamina;Understanding of Exercise Prescription Increase Physical Activity;Increase Strength and Stamina;Understanding of Exercise Prescription Increase Physical Activity;Increase Strength and Stamina;Understanding of Exercise Prescription Increase Physical Activity;Increase Strength and Stamina;Understanding of Exercise Prescription   Comments Reviewed RPE and dyspnea scale, THR and program prescription with pt today.  Pt voiced understanding and was given a copy of goals to take home. Tae is off to a good start in the program. He was able to attend his first and only session during this review period. During his session he was able to use the treadmill at a speed of 2.9 mph and an incline of 2%, as well as the XR at level 5. We will continue to monitor his progress in the program. Momin is doing well at rehab, he is increasing workload as able. At home he is doing some hand weight exercise and walking 3-5 times per day Isac continues to do well in rehab. He increased his treadmill worload to a speed of 3 mph with an incline of 4.5%. He also began using the elliptical at level 1 and improved to level 6 on the XR. We will continue to monitor his progress in the program. Dodger is doing well at rehab, he is increasing his workload during visits. He says the consistentcy of attending rehab has helped him improve in stamina. He is walking and using handweights most days at home.   Expected Outcomes Short: Use RPE daily to regulate intensity. Long: Follow program prescription in THR. Short: Continue to follow exercise  prescription. Long: Continue exercise to improve strength and stamina. STG: Increase workload as able. LTG: Continue exercise to improve strength and stamina. Short: Continue to progressively increase treadmill workload. Long: Continue exercise to improve strength and stamina. STG: Continue  to attend rehab and exercise at home. Continue exercise to improve strength and stamina.    Row Name 03/03/24 1635 03/08/24 0943 03/15/24 1332 03/30/24 1329 04/12/24 0805     Exercise Goal Re-Evaluation   Exercise Goals Review Increase Physical Activity;Increase Strength and Stamina;Understanding of Exercise Prescription Increase Physical Activity;Able to understand and use rate of perceived exertion (RPE) scale;Knowledge and understanding of Target Heart Rate Range (THRR);Understanding of Exercise Prescription;Increase Strength and Stamina;Able to understand and use Dyspnea scale;Able to check pulse independently Increase Physical Activity;Increase Strength and Stamina;Understanding of Exercise Prescription Increase Physical Activity;Increase Strength and Stamina;Understanding of Exercise Prescription Increase Physical Activity;Increase Strength and Stamina;Understanding of Exercise Prescription   Comments Kaizen continues to do well in rehab. He was able to maintain his workload on the treadmill at and 4.5% incline. He was also able to maintain level 1 on the elliptical. We will continue to encourage and monitor his progress in the program. Reviewed home exercise with pt today.  Pt plans to walk and use hand weights for exercise.  Reviewed THR, pulse, RPE, sign and symptoms, pulse oximetery and when to call 911 or MD.  Also discussed weather considerations and indoor options.  Pt voiced understanding. Cledith continues to do well in rehab. He recently increased his treadmill workload by increasing his incline to 7% while maintaining his speed at 3 mph. He also improved to level 3 on the elliptical and level 7 on the XR.  We will continue to monitor his progress in the program. Jerian is doing well in rehab. He has been able to maintain his workload on the treadmill at a speed of and 6.5% incline. He was recently able to add the rowing machine to his exercise prescription at level 5. We will continue to monitor his progress in the program. Dayshawn continues to do well in rehab. He increased his treadmill workload back up to an incline of 7% while maintaining a speed of 3 mph. He also improved to level 7 on the rower machine. We will continue to monitor his progress in the program.   Expected Outcomes Short: Increase treadmill incline to 5%. Long: Continue exercise to imptove strength and stamina. Short: add 1-2 days a week of exercies at home on off days of cardiac rehab. Long: maintain independent exercise routine upon graduation from cardiac rehab. Short: Continue to progressively increase treadmill workload. Long: Continue exercise to improve strength and stamina. Short: Continue to progressively increase treadmill workload. Long: Continue exercise to improve strength and stamina. Short: Continue to progressively increase treadmill workload. Long: Continue exercise to improve strength and stamina.    Row Name 04/12/24 1000 04/28/24 0935 05/12/24 1040 05/23/24 1547 06/07/24 1029     Exercise Goal Re-Evaluation   Exercise Goals Review Increase Physical Activity;Increase Strength and Stamina;Understanding of Exercise Prescription Increase Physical Activity;Increase Strength and Stamina;Understanding of Exercise Prescription Increase Physical Activity;Increase Strength and Stamina;Understanding of Exercise Prescription Increase Physical Activity;Increase Strength and Stamina;Understanding of Exercise Prescription Increase Physical Activity;Increase Strength and Stamina;Understanding of Exercise Prescription   Comments Binyomin continues to exercise at home. He has a walking pad at home that he uses everyday that he doesnt attend  the program. He also has access to handweights at home that he uses, and tries to do the same workouts as in the program. Kyrie continues to do well in the program. He recently increased his treadmill incline to 8% while maintaining his speed at 3 mph. He also continues to work at level 3 on the  elliptical and level 6 on the rower. We will continue to monitor his progress in the program. Cadyn continues to do well in the program and he only attended 2 sessions in this review. He increased his speed on the treadmill to 3.1 mph and had an incline of 6%. He maintained level 3 on the elliptical and level 6 on the rower. We will continue to monitor his progress in the program. Malichi only attended two sessions since the last review. He continues to do well on the elliptical at level 3 and the treadmill at a speed of 3 mph with an 8% incline. He is also due for his post and will look to improve on it. We will continue to monitor his progress in the program. Cadell is doing well with his exercise at home. He is walking daily around 2 miles. He also does handweights for 3-4 days a week. He is graduating very soon and plans to rejoin [employer] after graduation.   Expected Outcomes Short: Continue to exercise at home. Long: Exercise in the program and at home to improve strength and stamina. Short: Continue to progressively increase treadmill workload. Long: Continue exercise to improve strength and stamina. Short: Attend more consistently and continue to progressively increase treadmill and elliptical workloads. Long: Continue exercise to improve strength and stamina. Short: Improve on post . Long: Continue to increase overall METs and stamina. Short: Keep up home exercise and graduate from program. Long: Continue exercise independently.    Row Name 06/09/24 1429             Exercise Goal Re-Evaluation   Exercise Goals Review Increase Physical Activity;Increase Strength and Stamina;Understanding of Exercise  Prescription       Comments Suresh continues to do well in rehab and will graduate very soon. He completed his post and improved by 8.3% with 1631ft. He increased his workload on the treadmill with a speed of 3. and incline of 7.5%. He also increased to level 4 on the elliptical and did 6 minute intervals on it. We will continue to monitor his progress in the program until graduation.       Expected Outcomes Short: Graduate. Long: Continue to exercise independently.          Discharge Exercise Prescription (Final Exercise Prescription Changes):  Exercise Prescription Changes - 06/09/24 1400       Response to Exercise   Blood Pressure (Admit) 122/64    Blood Pressure (Exit) 122/72    Heart Rate (Admit) 60 bpm    Heart Rate (Exercise) 111 bpm    Heart Rate (Exit) 70 bpm    Rating of Perceived Exertion (Exercise) 13    Symptoms none    Duration Continue with 30 min of aerobic exercise without signs/symptoms of physical distress.    Intensity THRR unchanged      Progression   Progression Continue to progress workloads to maintain intensity without signs/symptoms of physical distress.    Average METs 6.09      Resistance Training   Training Prescription Yes    Weight 10 lb    Reps 10-15      Interval Training   Interval Training Yes    Equipment Elliptical    Comments 6 min intervals      Treadmill   MPH 3.2    Grade 7.5    Minutes 15    METs 6.76      Elliptical   Level 4    Speed 3.2  Minutes 15    METs 4.1      Home Exercise Plan   Plans to continue exercise at Home (comment)   walking and hand weights.   Frequency Add 2 additional days to program exercise sessions.    Initial Home Exercises Provided 03/08/24      Oxygen   Maintain Oxygen Saturation 88% or higher          Nutrition:  Target Goals: Understanding of nutrition guidelines, daily intake of sodium 1500mg , cholesterol 200mg , calories 30% from fat and 7% or less from saturated fats, daily  to have 5 or more servings of fruits and vegetables.  Education: Nutrition 1 -Group instruction provided by verbal, written material, interactive activities, discussions, models, and posters to present general guidelines for heart healthy nutrition including macronutrients, label reading, and promoting whole foods over processed counterparts. Education serves as Pensions consultant of discussion of heart healthy eating for all. Written material provided at class time.    Education: Nutrition 2 -Group instruction provided by verbal, written material, interactive activities, discussions, models, and posters to present general guidelines for heart healthy nutrition including sodium, cholesterol, and saturated fat. Providing guidance of habit forming to improve blood pressure, cholesterol, and body weight. Written material provided at class time.     Biometrics:  Pre Biometrics - 01/20/24 0923       Pre Biometrics   Height 5' 7.5 (1.715 m)    Weight 162 lb 4.8 oz (73.6 kg)    Waist Circumference 35 inches    Hip Circumference 37.5 inches    Waist to Hip Ratio 0.93 %    BMI (Calculated) 25.03    Single Leg Stand 30 seconds          Post Biometrics - 05/26/24 0929        Post  Biometrics   Height 5' 7.5 (1.715 m)    Weight 167 lb 12.8 oz (76.1 kg)    Waist Circumference 37 inches    Hip Circumference 39 inches    Waist to Hip Ratio 0.95 %    BMI (Calculated) 25.88    Single Leg Stand 24.9 seconds          Nutrition Therapy Plan and Nutrition Goals:  Nutrition Therapy & Goals - 01/20/24 0920       Nutrition Therapy   RD appointment deferred Yes      Intervention Plan   Intervention Prescribe, educate and counsel regarding individualized specific dietary modifications aiming towards targeted core components such as weight, hypertension, lipid management, diabetes, heart failure and other comorbidities.    Expected Outcomes Short Term Goal: Understand basic principles of dietary  content, such as calories, fat, sodium, cholesterol and nutrients.;Short Term Goal: A plan has been developed with personal nutrition goals set during dietitian appointment.;Long Term Goal: Adherence to prescribed nutrition plan.          Nutrition Assessments:  MEDIFICTS Score Key: >=70 Need to make dietary changes  40-70 Heart Healthy Diet <= 40 Therapeutic Level Cholesterol Diet  Flowsheet Row Cardiac Rehab from 06/14/2024 in [facility] Cardiac and Pulmonary Rehab  Picture Your Plate Total Score on Admission 64  Picture Your Plate Total Score on Discharge 68   Picture Your Plate Scores: <59 Unhealthy dietary pattern with much room for improvement. 41-50 Dietary pattern unlikely to meet recommendations for good health and room for improvement. 51-60 More healthful dietary pattern, with some room for improvement.  >60 Healthy dietary pattern, although there may be some specific behaviors that  could be improved.    Nutrition Goals Re-Evaluation:  Nutrition Goals Re-Evaluation     Row Name 02/11/24 [phone] 02/23/24 0951 04/12/24 0950 06/07/24 1035       Goals   Current Weight -- -- 170 lb 9.6 oz (77.4 kg) --    Comment Arley deffered RD appointment Deferred Deferred Deferred       Nutrition Goals Discharge (Final Nutrition Goals Re-Evaluation):  Nutrition Goals Re-Evaluation - 06/07/24 1035       Goals   Comment Deferred          Psychosocial: Target Goals: Acknowledge presence or absence of significant depression and/or stress, maximize coping skills, provide positive support system. Participant is able to verbalize types and ability to use techniques and skills needed for reducing stress and depression.   Education: Stress, Anxiety, and Depression - Group verbal and visual presentation to define topics covered.  Reviews how body is impacted by stress, anxiety, and depression.  Also discusses healthy ways to reduce stress and to treat/manage anxiety and depression. Written  material provided at class time. Flowsheet Row Cardiac Rehab from 03/31/2024 in [facility] Cardiac and Pulmonary Rehab  Date 03/31/24  Educator SB  Instruction Review Code 1- [employer] Understanding    Education: Sleep Hygiene -Provides group verbal and written instruction about how sleep can affect your health.  Define sleep hygiene, discuss sleep cycles and impact of sleep habits. Review good sleep hygiene tips.   Initial Review & Psychosocial Screening:  Initial Psych Review & Screening - 01/18/24 1340       Initial Review   Current issues with None Identified      Family Dynamics   Good Support System? Yes    Comments He can look to his friends and family for support. 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      Barriers   Psychosocial barriers to participate in program There are no identifiable barriers or psychosocial needs.;The patient should benefit from training in stress management and relaxation.      Screening Interventions   Interventions Encouraged to exercise;Provide feedback about the scores to participant;To provide support and resources with identified psychosocial needs    Expected Outcomes Short Term goal: Utilizing psychosocial counselor, staff and physician to assist with identification of specific Stressors or current issues interfering with healing process. Setting desired goal for each stressor or current issue identified.;Long Term Goal: Stressors or current issues are controlled or eliminated.;Long Term goal: The participant improves quality of Life and PHQ9 Scores as seen by post scores and/or verbalization of changes;Short Term goal: Identification and review with participant of any Quality of Life or Depression concerns found by scoring the questionnaire.          Quality of Life Scores:   Quality of Life - 06/14/24 0934       Quality of Life Scores   Health/Function Pre 17.5 %    Health/Function Post 18.8 %     Health/Function % Change 7.43 %    Socioeconomic Pre 21.63 %    Socioeconomic Post 22.06 %    Socioeconomic % Change  1.99 %    Psych/Spiritual Pre 22.5 %    Psych/Spiritual Post 22.36 %    Psych/Spiritual % Change -0.62 %    Family Pre 19.6 %    Family Post 21.7 %    Family % Change 10.71 %    GLOBAL Pre 19.74 %    GLOBAL Post 20.67 %  GLOBAL % Change 4.71 %         Scores of 19 and below usually indicate a poorer quality of life in these areas.  A difference of  2-3 points is a clinically meaningful difference.  A difference of 2-3 points in the total score of the Quality of Life Index has been associated with significant improvement in overall quality of life, self-image, physical symptoms, and general health in studies assessing change in quality of life.  PHQ-9: Review Flowsheet  More data exists      06/14/2024 06/07/2024 02/23/2024 01/20/2024 11/06/2022  Depression screen PHQ 2/9  Decreased Interest 1 0 1 1 0  Down, Depressed, Hopeless 1 1 1 1  0  PHQ - 2 Score 2 1 2 2  0  Altered sleeping 2 2 2 2  0  Tired, decreased energy 2 2 2 2 1   Change in appetite 0 1 0 0 0  Feeling bad or failure about yourself  1 1 0 0 0  Trouble concentrating 2 1 0 1 1  Moving slowly or fidgety/restless 1 0 0 0 1  Suicidal thoughts 0 0 0 0 0  PHQ-9 Score 10 8 6 7 3   Difficult doing work/chores Not difficult at all Not difficult at all Somewhat difficult Not difficult at all Not difficult at all   Interpretation of Total Score  Total Score Depression Severity:  1-4 = Minimal depression, 5-9 = Mild depression, 10-14 = Moderate depression, 15-19 = Moderately severe depression, 20-27 = Severe depression   Psychosocial Evaluation and Intervention:  Psychosocial Evaluation - 01/18/24 1341       Psychosocial Evaluation & Interventions   Interventions Encouraged to exercise with the program and follow exercise prescription;Relaxation education;Stress management education    Comments He can look to  his friends and family for support. 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    Expected Outcomes Short: Start [facility] to help with mood. Long: Maintain a healthy mental state    Continue Psychosocial Services  Follow up required by staff          Psychosocial Re-Evaluation:  Psychosocial Re-Evaluation     Row Name 02/11/24 [phone] 02/23/24 0943 04/12/24 0946 06/07/24 1031       Psychosocial Re-Evaluation   Current issues with Current Sleep Concerns Current Sleep Concerns Current Sleep Concerns None Identified    Comments Jadis struggles to stay asleep and wakes up 4-5 times per night. Reports he gets 2-7hrs of sleep. He has been working with his Dr to improve it. Tahsin denies any stress, depression, or anxiety. He is still struggling with sleep. Has a apnea machine but reports it doesn't help. Harvard is not dealing with any stressors at this time. He reports that rehab has drastically improved his sleep habits. He has returned to sleeping in his bed as opposed to the recliner. Ragan is not dealing with any current stressors. The only instance of a stressor would be around his heart health as he has been through cardiac rehab twice and is nervous whenever he notices something off related to his heart. He does not have any stress with work or home and is semi-retired. He has noticed major improvements in his sleeping, but it is not where he wants it to be. He aims for 7 hours a night and starts out in the recliner for 2 hours and gets up and goes to bed for the other 5 hours. He has a strong support  system with his wife and daughter and many friends here. His PHQ score reassessed today was 8.    Expected Outcomes STG: focus on good sleep hygiene. LTG: Achieve and maintain a positive outlook on health and daily life STG: Focus on good sleep hygiene. LTG: Achieve and maintain a positive outlook on health and daily life Short: Continue to improve sleeping habits,  manage stressors in healthy means. Long: Continue to attend rehab to help manage stressors. Short: Continue to improve sleeping habits and manage any stressors that arise. Long: Continue to mange any stressors that arise    Interventions Encouraged to attend Cardiac Rehabilitation for the exercise Encouraged to attend Cardiac Rehabilitation for the exercise Encouraged to attend Cardiac Rehabilitation for the exercise Encouraged to attend Cardiac Rehabilitation for the exercise    Continue Psychosocial Services  Follow up required by staff Follow up required by staff Follow up required by staff Follow up required by staff       Psychosocial Discharge (Final Psychosocial Re-Evaluation):  Psychosocial Re-Evaluation - 06/07/24 1031       Psychosocial Re-Evaluation   Current issues with None Identified    Comments Heidi is not dealing with any current stressors. The only instance of a stressor would be around his heart health as he has been through cardiac rehab twice and is nervous whenever he notices something off related to his heart. He does not have any stress with work or home and is semi-retired. He has noticed major improvements in his sleeping, but it is not where he wants it to be. He aims for 7 hours a night and starts out in the recliner for 2 hours and gets up and goes to bed for the other 5 hours. He has a strong support system with his wife and daughter and many friends here. His PHQ score reassessed today was 8.    Expected Outcomes Short: Continue to improve sleeping habits and manage any stressors that arise. Long: Continue to mange any stressors that arise    Interventions Encouraged to attend Cardiac Rehabilitation for the exercise    Continue Psychosocial Services  Follow up required by staff          Vocational Rehabilitation: Provide vocational rehab assistance to qualifying candidates.   Vocational Rehab Evaluation & Intervention:   Education: Education Goals: Education  classes will be provided on a variety of topics geared toward better understanding of heart health and risk factor modification. Participant will state understanding/return demonstration of topics presented as noted by education test scores.  Learning Barriers/Preferences:  Learning Barriers/Preferences - 01/18/24 1340       Learning Barriers/Preferences   Learning Barriers None    Learning Preferences None          General Cardiac Education Topics:  AED/CPR: - Group verbal and written instruction with the use of models to demonstrate the basic use of the AED with the basic ABC's of resuscitation.   Test and Procedures: - Group verbal and visual presentation and models provide information about basic cardiac anatomy and function. Reviews the testing methods done to diagnose heart disease and the outcomes of the test results. Describes the treatment choices: Medical Management, Angioplasty, or Coronary Bypass Surgery for treating various heart conditions including Myocardial Infarction, Angina, Valve Disease, and Cardiac Arrhythmias. Written material provided at class time. Flowsheet Row Cardiac Rehab from 03/31/2024 in [facility] Cardiac and Pulmonary Rehab  Education need identified 01/20/24    Medication Safety: - Group verbal and visual instruction to review  commonly prescribed medications for heart and lung disease. Reviews the medication, class of the drug, and side effects. Includes the steps to properly store meds and maintain the prescription regimen. Written material provided at class time. Flowsheet Row Cardiac Rehab from 11/06/2022 in [facility] Cardiac and Pulmonary Rehab  Date 11/06/22  Educator [facility]  Instruction Review Code 1- Verbalizes Understanding    Intimacy: - Group verbal instruction through game format to discuss how heart and lung disease can affect sexual intimacy. Written material provided at class time.   Know Your Numbers and Heart Failure: - Group verbal and visual  instruction to discuss disease risk factors for cardiac and pulmonary disease and treatment options.  Reviews associated critical values for Overweight/Obesity, Hypertension, Cholesterol, and Diabetes.  Discusses basics of heart failure: signs/symptoms and treatments.  Introduces Heart Failure Zone chart for action plan for heart failure. Written material provided at class time. Flowsheet Row Cardiac Rehab from 03/31/2024 in [facility] Cardiac and Pulmonary Rehab  Education need identified 01/20/24    Infection Prevention: - Provides verbal and written material to individual with discussion of infection control including proper hand washing and proper equipment cleaning during exercise session. Flowsheet Row Cardiac Rehab from 03/31/2024 in [facility] Cardiac and Pulmonary Rehab  Date 01/18/24  Educator jh  Instruction Review Code 1- Verbalizes Understanding    Falls Prevention: - Provides verbal and written material to individual with discussion of falls prevention and safety. Flowsheet Row Cardiac Rehab from 03/31/2024 in [facility] Cardiac and Pulmonary Rehab  Date 01/18/24  Educator jh  Instruction Review Code 1- Verbalizes Understanding    Other: -Provides group and verbal instruction on various topics (see comments) Flowsheet Row Cardiac Rehab from 11/06/2022 in [facility] Cardiac and Pulmonary Rehab  Date 10/30/22  Educator SB  Instruction Review Code 1- Verbalizes Understanding    Knowledge Questionnaire Score:  Knowledge Questionnaire Score - 06/14/24 9061       Knowledge Questionnaire Score   Pre Score 23/26    Post Score 26/26          Core Components/Risk Factors/Patient Goals at Admission:  Personal Goals and Risk Factors at Admission - 01/18/24 1339       Core Components/Risk Factors/Patient Goals on Admission    Weight Management Yes;Weight Maintenance    Intervention Weight Management: Develop a combined nutrition and exercise program designed to reach desired caloric intake, while  maintaining appropriate intake of nutrient and fiber, sodium and fats, and appropriate energy expenditure required for the weight goal.;Weight Management: Provide education and appropriate resources to help participant work on and attain dietary goals.;Weight Management/Obesity: Establish reasonable short term and long term weight goals.    Expected Outcomes Short Term: Continue to assess and modify interventions until short term weight is achieved;Weight Maintenance: Understanding of the daily nutrition guidelines, which includes 25-35% calories from fat, 7% or less cal from saturated fats, less than 200mg  cholesterol, less than 1.5gm of sodium, & 5 or more servings of fruits and vegetables daily;Understanding recommendations for meals to include 15-35% energy as protein, 25-35% energy from fat, 35-60% energy from carbohydrates, less than 200mg  of dietary cholesterol, 20-35 gm of total fiber daily;Understanding of distribution of calorie intake throughout the day with the consumption of 4-5 meals/snacks    Diabetes Yes    Intervention Provide education about signs/symptoms and action to take for hypo/hyperglycemia.;Provide education about proper nutrition, including hydration, and aerobic/resistive exercise prescription along with prescribed medications to achieve blood glucose in normal ranges: Fasting glucose 65-99 mg/dL  Expected Outcomes Short Term: Participant verbalizes understanding of the signs/symptoms and immediate care of hyper/hypoglycemia, proper foot care and importance of medication, aerobic/resistive exercise and nutrition plan for blood glucose control.;Long Term: Attainment of HbA1C < 7%.    Hypertension Yes    Intervention Provide education on lifestyle modifcations including regular physical activity/exercise, weight management, moderate sodium restriction and increased consumption of fresh fruit, vegetables, and low fat dairy, alcohol moderation, and smoking cessation.;Monitor  prescription use compliance.    Expected Outcomes Short Term: Continued assessment and intervention until BP is < 140/43mm HG in hypertensive participants. < 130/10mm HG in hypertensive participants with diabetes, heart failure or chronic kidney disease.;Long Term: Maintenance of blood pressure at goal levels.          Education:Diabetes - Individual verbal and written instruction to review signs/symptoms of diabetes, desired ranges of glucose level fasting, after meals and with exercise. Acknowledge that pre and post exercise glucose checks will be done for 3 sessions at entry of program. Flowsheet Row Cardiac Rehab from 03/31/2024 in [facility] Cardiac and Pulmonary Rehab  Date 01/18/24  Educator jh  Instruction Review Code 1- Verbalizes Understanding    Core Components/Risk Factors/Patient Goals Review:   Goals and Risk Factor Review     Row Name 02/11/24 0936 02/23/24 0951 04/12/24 0951 06/07/24 1036       Core Components/Risk Factors/Patient Goals Review   Personal Goals Review Hypertension Hypertension Hypertension Weight Management/Obesity;Hypertension;Diabetes    Review Karis is checking his blood pressure at home, says it runs consistently around 110/60s. He reports it matches well with the readings here at rehab. Atanacio reports he is checking his BP at home and runs around 110/60. says he feels better when its closer to 120/7o. Encouraged him to drink ~64oz of water , he says he is currently around ~32oz daily. Kaj is taking his BP at home when he feels unusual. He states that the program has made him feel more in control of his BP, and has stayed around 110/60. Delos is doing well with maintaining his weight. He has a strong diet that he is happy with. It involves lower carb, not eating late, good protein intake, lower fat when able, no sweets, and no processed foods. He states he is up a little in weight and could lose 5 lbs, but he is happy with where he is at. He is checking his blood  pressure regularly and is getting readings in the 110-130s for systolic and 60-70s for diastolic. He states his blood pressure meds are finally set right. He is still watching his A1c for his diabetes and it is currently at 6.1. His doctor was happy with this as he used to be at 8, but does not want his A1c to go any higher.    Expected Outcomes STG: Continue to check BP regularly. LTG: manage risk factors independently STG: Drink more water , check BP at home.  LTG: manage risk factors independently Short: Check BP at home more frequently. Long: Continue to monitor BP to manage hypertension. Short: Cotinue dietary habits, checking blood pressure, and watching his A1c levels. Long: Continue healthy diet and to manage hypertension and diabetes       Core Components/Risk Factors/Patient Goals at Discharge (Final Review):   Goals and Risk Factor Review - 06/07/24 1036       Core Components/Risk Factors/Patient Goals Review   Personal Goals Review Weight Management/Obesity;Hypertension;Diabetes    Review Wayden is doing well with maintaining his weight. He has a strong diet  that he is happy with. It involves lower carb, not eating late, good protein intake, lower fat when able, no sweets, and no processed foods. He states he is up a little in weight and could lose 5 lbs, but he is happy with where he is at. He is checking his blood pressure regularly and is getting readings in the 110-130s for systolic and 60-70s for diastolic. He states his blood pressure meds are finally set right. He is still watching his A1c for his diabetes and it is currently at 6.1. His doctor was happy with this as he used to be at 8, but does not want his A1c to go any higher.    Expected Outcomes Short: Cotinue dietary habits, checking blood pressure, and watching his A1c levels. Long: Continue healthy diet and to manage hypertension and diabetes          ITP Comments:  ITP Comments     Row Name 01/18/24 1339 01/20/24 0917  01/28/24 1029 02/17/24 1041 03/16/24 1035   ITP Comments Virtual Visit completed. Patient informed on EP and RD appointment and 6 Minute walk test. Patient also informed of patient health questionnaires on My Chart. Patient Verbalizes understanding. Visit diagnosis can be found in [facility] 12/10/2023. Completed and gym orientation for cardiac rehab. Initial ITP created and sent for review to Dr. Oneil Pinal, Medical Director. irst full day of exercise!  Patient was oriented to gym and equipment including functions, settings, policies, and procedures.  Patient's individual exercise prescription and treatment plan were reviewed.  All starting workloads were established based on the results of the 6 minute walk test done at initial orientation visit.  The plan for exercise progression was also introduced and progression will be customized based on patient's performance and goals. 30 Day review completed. Medical Director ITP review done, changes made as directed, and signed approval by Medical Director.    new to program 30 Day review completed. Medical Director ITP review done, changes made as directed, and signed approval by Medical Director.    Row Name 04/13/24 0957 05/11/24 1006 06/08/24 1452 06/21/24 0917     ITP Comments 30 Day review completed. Medical Director ITP review done, changes made as directed, and signed approval by Medical Director. 30 Day review completed. Medical Director ITP review done, changes made as directed, and signed approval by Medical Director. 30 Day review completed. Medical Director ITP review done, changes made as directed, and signed approval by Medical Director. Teegan graduated today from  rehab with 36 sessions completed.  Details of the patient's exercise prescription and what He needs to do in order to continue the prescription and progress were discussed with patient.  Patient was given a copy of prescription and goals.  Patient verbalized understanding. Rishab plans to  continue to exercise by walking and using hand weights at home.       Comments: discharge ITP

## 2024-06-21 NOTE — Progress Notes (Signed)
 Daily Session Note  Patient Details  Name: Marcus Huang MRN: [id number] Date of Birth: [birth year] Referring Provider:   Flowsheet Row Cardiac Rehab from 01/20/2024 in [facility]  Referring Provider Dr. Marsa Dooms    Encounter Date: 06/21/2024  Check In:  Session Check In - 06/21/24 0915       Check-In   Supervising physician immediately available to respond to emergencies See telemetry face sheet for immediately available ER MD    Location [facility]-Cardiac & Pulmonary Rehab    Staff Present Burnard Davenport RN,BSN,MPA;Maxon Conetta BS, Exercise Physiologist;Margaret Best, MS, Exercise Physiologist;Jason Elnor RDN,LDN    Virtual Visit No    Medication changes reported     No    Fall or balance concerns reported    No    Tobacco Cessation No Change    Warm-up and Cool-down Performed on first and last piece of equipment    Resistance Training Performed Yes    VAD Patient? No    PAD/SET Patient? No      Pain Assessment   Currently in Pain? No/denies             Social History   Tobacco Use  Smoking Status Never  Smokeless Tobacco Never    Goals Met:  Proper associated with RPD/PD & O2 Sat Independence with exercise equipment Exercise tolerated well No report of concerns or symptoms today Strength training completed today  Goals Unmet:  Not Applicable  Comments:  Marcus Huang graduated today from  rehab with 36 sessions completed.  Details of the patient's exercise prescription and what He needs to do in order to continue the prescription and progress were discussed with patient.  Patient was given a copy of prescription and goals.  Patient verbalized understanding. Marcus Huang plans to continue to exercise by walking and using hand weights at home.    Dr. Oneil Pinal is Medical Director for [facility] Cardiac Rehabilitation.  Dr. Fuad Aleskerov is Medical Director for [facility] Pulmonary Rehabilitation.

## 2024-06-23 ENCOUNTER — Encounter

## 2024-07-01 ENCOUNTER — Ambulatory Visit: Admitting: Occupational Therapy

## 2024-07-01 DIAGNOSIS — M25641 Stiffness of right hand, not elsewhere classified: Secondary | ICD-10-CM

## 2024-07-01 DIAGNOSIS — M79641 Pain in right hand: Secondary | ICD-10-CM

## 2024-07-01 DIAGNOSIS — M6281 Muscle weakness (generalized): Secondary | ICD-10-CM

## 2024-07-01 DIAGNOSIS — L905 Scar conditions and fibrosis of skin: Secondary | ICD-10-CM

## 2024-07-01 NOTE — Therapy (Signed)
 OUTPATIENT OCCUPATIONAL THERAPY ORTHO TREATMENT  Patient Name: Marcus Huang MRN: [id number] DOB:1950-12-06, 73 y.o., male Today's Date: 07/01/2024  PCP: Dr Kip MART PROVIDER: Kip PA  END OF SESSION:  OT End of Session - 07/01/24 0828     Visit Number 5    Number of Visits 6    Date for Recertification  07/11/24    OT Start Time 9182    Activity Tolerance Patient tolerated treatment well    Behavior During Therapy [facility] for tasks assessed/performed          Past Medical History:  Diagnosis Date   Carotid artery thrombosis    Left Carotid Artery   Coronary artery disease    Diabetes (HCC)    Diverticulosis    History of kidney stones    Hyperlipidemia    Hypertension    Renal mass    Sleep apnea    C-PAP   Past Surgical History:  Procedure Laterality Date   COLONOSCOPY WITH PROPOFOL  N/A 08/15/2019   Procedure: COLONOSCOPY WITH PROPOFOL ;  Surgeon: Jinny Carmine, MD;  Location: [facility];  Service: Endoscopy;  Laterality: N/A;  sleep apnea   CORONARY ARTERY BYPASS GRAFT N/A 12/10/2023   Procedure: CORONARY ARTERY BYPASS GRAFTING (CABG) xTHREE USING LEFT INTERNAL MAMMARY ARTERY AND ENDOSCOPICALLY HARVESTED RIGHT GREATER SAPPHENOUS VEIN;  Surgeon: Lucas Dorise POUR, MD;  Location: [facility] OR;  Service: Open Heart Surgery;  Laterality: N/A;   CORONARY STENT INTERVENTION N/A 08/05/2022   Procedure: CORONARY STENT INTERVENTION;  Surgeon: Ammon Blunt, MD;  Location: [facility] INVASIVE CV LAB;  Service: Cardiovascular;  Laterality: N/A;   CORONARY STENT INTERVENTION Left 08/28/2022   Procedure: CORONARY STENT INTERVENTION;  Surgeon: Ammon Blunt, MD;  Location: [facility] INVAS[facility] CV LAB;  Service: Cardiovascular;  Laterality: Left;   EYE SURGERY Bilateral    LASIK EYE SURGERY   INCISION AND DRAINAGE OF WOUND Right 05/05/2024   Procedure: IRRIGATION AND DEBRIDEMENT WOUND RIGHT INDEX FINGER, EXPLORATION WITH IRRIGATION AND DEBRIDEMENT OF FLEXOR SHEATH WITH  RELEASE OF A-1 PULLEY;  Surgeon: Edie Norleen PARAS, MD;  Location: [facility] ORS;  Service: Orthopedics;  Laterality: Right;  flexor sheath right index   INGUINAL HERNIA REPAIR Bilateral 05/12/2017   Procedure: LAPAROSCOPIC BILATERAL INGUINAL HERNIA REPAIR, umbilical hernia repair;  Surgeon: Wonda Charlie BRAVO, MD;  Location: [facility] ORS;  Service: General;  Laterality: Bilateral;   INTRAOPERATIVE TRANSESOPHAGEAL ECHOCARDIOGRAM N/A 12/10/2023   Procedure: ECHOCARDIOGRAM, TRANSESOPHAGEAL, INTRAOPERATIVE;  Surgeon: Lucas Dorise POUR, MD;  Location: [facility] OR;  Service: Open Heart Surgery;  Laterality: N/A;   LEFT HEART CATH AND CORONARY ANGIOGRAPHY N/A 08/05/2022   Procedure: LEFT HEART CATH AND CORONARY ANGIOGRAPHY;  Surgeon: Ammon Blunt, MD;  Location: [facility] INV[facility]IVE CV LAB;  Service: Cardiovascular;  Laterality: N/A;   LEFT HEART CATH AND CORONARY ANGIOGRAPHY Left 11/11/2023   Procedure: LEFT HEART CATH AND CORONARY ANGIOGRAPHY;  Surgeon: Ammon Blunt, MD;  Location: [facility] INVASIVE CV LAB;  Service: Cardiovascular;  Laterality: Left;   TONSILLECTOMY     As a child   Patient Active Problem List   Diagnosis Date Noted   Cellulitis of finger of right hand 05/05/2024   Cellulitis 05/05/2024   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   Atherosclerosis of native arteries of extremity with intermittent claudication 10/12/2022   Lymphedema 08/24/2022   Raynaud's disease 08/24/2022   PAD (peripheral artery disease) 08/24/2022   S/P drug eluting coronary stent placement 08/15/2022   CAD (coronary artery disease) 08/05/2022   Screen for colon cancer 03/14/2020  Special screening for malignant neoplasms, colon    Peyronie's disease 07/16/2019   Renal mass 07/10/2019   Heart palpitations 06/27/2019   Carotid artery stenosis 06/02/2019   Umbilical hernia without obstruction and without gangrene 06/02/2019   Aortic atherosclerosis 06/02/2019   Hypertensive urgency 05/31/2019   Non-recurrent bilateral inguinal hernia  without obstruction or gangrene    Diabetes type 2, uncontrolled 02/14/2014   Hyperlipidemia, unspecified 02/14/2014   Malignant hypertension 02/14/2014   Type 2 diabetes mellitus without complication, without long-term current use of insulin  (HCC) 02/14/2014   Hyperlipidemia 02/14/2014   Hypertension, essential, benign 02/14/2014    ONSET DATE: 05/05/24  REFERRING DIAG: R hand cellulitis with I&D 2nd digit and palm   THERAPY DIAG:  Stiffness of right hand, not elsewhere classified  Scar tissue  Pain in right hand  Muscle weakness (generalized)  Rationale for Evaluation and Treatment: Rehabilitation  SUBJECTIVE:   SUBJECTIVE STATEMENT: My finger is better than last time.  I backed off on the massaging.  The area that still bothers me the most is that scar on the side of the tip.  And that is what I need to play the guitar with my pick.  And then it still feels just stiff especially in the morning Pt accompanied by: self  PERTINENT HISTORY: 05/17/24 Ortho not :  The patient presents today with a dressing applied to the right hand. Dressing was removed at today's visit. Skin examination of the right hand demonstrates a well-healing incision at the base of the finger on where the entire A1 pulley was released. Sutures are intact without any evidence of loosening. The sutures were removed today without complication. Benzoin and Steri-Strips were applied. The patient has a well-healed stab type incision along the radial aspect of the distal phalanx without any erythema. The patient does still have some mild swelling throughout the finger however wrinkles are beginning to appear. He denies any significant tenderness to palpation along the volar or dorsal aspect of the finger at this time. He is able to fully extend the finger, limitations with flexion due to the continued swelling but no pain with passive flexion or extension of the finger. There is no purulent drainage coming from either  incision site. He is intact to light touch, he does report some slight loss of sensation along the distal aspect of the radial portion of the distal phalanx. Cap refills intact to each individual digit. Radial or pulse are intact at today's visit. A Band-Aid was applied over the stab incision along the radial aspect of this callus.  Imaging: None.  Impression: Suppurative tenosynovitis of flexor tendon of right hand [M65.141] Suppurative tenosynovitis of flexor tendon of right hand (primary encounter diagnosis) Infected foreign body of finger of right hand, sequela Felon of finger of right hand with lymphangitis  Plan:  1. Treatment options were discussed today with the patient. 2. Continue with doxycycline as prescribed. 3. The patient was instructed that he may begin to get the finger wet over the A1 pulley incision site. Sutures were removed at today's visit and benzoin and Steri-Strips were applied. He was instructed to keep the stab incision dry until Friday. 4. Begin to massage to the index finger and work on continued range of motion. Gradually increase activities as tolerated. Referral for occupational therapy was placed with the patient. 5. The patient will follow up with me in 2 weeks for repeat skin check of the right hand. 6. They can call the clinic they have any questions,  new symptoms develop or symptoms worsen.   PRECAUTIONS: None     WEIGHT BEARING RESTRICTIONS: No  PAIN:  Are you having pain?  3/10 pain with flexion of DIP/PIP   FALLS: Has patient fallen in last 6 months? No  LIVING ENVIRONMENT: Lives with: Live with wife    PLOF: Had normal active range of motion and strength and right dominant hand.  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  PATIENT GOALS: Want the pain and scar tissue better as well as more motion to return to normal activity  NEXT MD VISIT: This week   OBJECTIVE:   Note: Objective measures were completed at Evaluation unless otherwise noted.  HAND DOMINANCE: Left  ADLs: Cannot grip tight objects as well as pinching  FUNCTIONAL OUTCOME MEASURES: Next session  UPPER EXTREMITY ROM:     Active ROM Right eval Left eval  Shoulder flexion    Shoulder abduction    Shoulder adduction    Shoulder extension    Shoulder internal rotation    Shoulder external rotation    Elbow flexion    Elbow extension    Wrist flexion    Wrist extension    Wrist ulnar deviation    Wrist radial deviation    Wrist pronation    Wrist supination    (Blank rows = not tested)  Active ROM Right eval Left eval R 05/06/24 R 07/01/24  Thumb MCP (0-60)      Thumb IP (0-80)      Thumb Radial abd/add (0-55)       Thumb Palmar abd/add (0-45)       Thumb Opposition to Small Finger       Index MCP (0-90) 80  85 80 80  Index PIP (0-100) 90  100 95 100  Index DIP (0-70)  60  75 70 70  Long MCP (0-90)        Long PIP (0-100)        Long DIP (0-70)        Ring MCP (0-90)        Ring PIP (0-100)        Ring DIP (0-70)        Little MCP (0-90)        Little PIP (0-100)        Little DIP (0-70)        (Blank rows = not tested)  )  HAND FUNCTION: Grip strength: Right:   lbs; Left:   lbs, Lateral pinch: Right: 21 lbs, Left: 27 lbs, and 3 point pinch: Right: 11 lbs, Left: 21 lbs 2 point pinch R 7 lbs and L 16 lbs  06/06/24 Grip strength: Right:   lbs; Left:   lbs, Lateral pinch: Right: 21 lbs, Left: 27 lbs, and 3 point pinch: Right: 16 lbs, Left: 21 lbs; 2 point pinch R 11 lbs and L 16 lbs 07/01/24 Grip strength: Right:   lbs; Left:   lbs, Lateral pinch: Right: 23 lbs, Left: 27 lbs, and 3 point pinch: Right: 16 lbs, Left: 21 lbs; 2 point pinch R 13 lbs and L 16 lbs COORDINATION: Decrease - tenderness at DIP of 2nd with pinching   SENSATION: Some numbness around incision at lateral DIP   EDEMA: proximal phalanges-R 6.9 cm and L 6.5 cm  COGNITION: Overall  cognitive status: Within functional limits for tasks assessed      TREATMENT DATE: 07/01/24  Patient show increased range of motion and strength every session.  Except last session.   Increase prehension strength.   Patient continues to show great motion within normal range but still continues stiffness especially in the morning Patient is limited in bilateral second digit MCP flexion to 80 degrees.   Patient continues to have some edema.  With a tight feeling with DIP/PIP flexion  Per remind patient will take a few months.   Scar tissue and scar adhesions improving  Patient to decrease amount of scar mobilization that resulted in decrease pain.    Modalities: Paraffin Time: Location: R wrist and hand  Decrease stiffness and decrease scar tissue prior to soft tissue mobilization  Patient do continue with contrast to decrease edema and pain and stiffness prior to home exercises to 3 times a day Or can do moist heat if not increase edema but need flexibility  Patient to continue with tendon glides not forcing second MC to 90.  Normal range compared to the left is 80 degrees Followed by [facility] flexion with PIP extension 10 reps Intrinsic a fist- DIP/PIP flexion 10 reps Followed by composite flexion 10 reps Not to force or squeeze forceful  Continue with scar mobilization 3 times a day Can continue with green putty again for gripping and 2-point pinch Continue with putty but not to a point of increased pain and edema.      PATIENT EDUCATION: Education details: findings of eval and HEP  Person educated: Patient Education method: Explanation, Demonstration, Tactile cues, Verbal cues, and Handouts Education comprehension: verbalized understanding, returned demonstration, verbal cues required, and needs further education   GOALS: Goals reviewed with  patient? Yes   LONG TERM GOALS: Target date: 6 wks  Patient be independent in home program to decrease scar tissue and tenderness and pain for patient to tolerate pressure with lateral pinch and 2 point pinch Baseline: 5/10 pain and tenderness over the scar with massage as well as lateral and precision pinch-scar tender and thick Goal status: Progressing  2.  Right second digit flexion improved to within normal range compared to the left hand symptom-free for patient to play guitar type shoes do buttons Baseline: Patient has MP flexion at 80 PIP 90 and DIP 60 degrees with increased discomfort and tightness.  With pressure use pain can increase to 5/10 Goal status: Progressing  3.  Right 3-point pinch and 2-point pinch improved to within normal range for patient to do buttons, play guitar open Ziploc bags or packages symptom-free Baseline: 3-point pinch right 11 pounds and left 21 pounds, 2-point pinch right 7 pounds left 16 pounds with discomfort increased to 5/10 Goal status: Progressing  ASSESSMENT:  CLINICAL IMPRESSION: Patient seen today for occupational therapy evaluation for right second digit cellulitis with the irrigation and debridement on 05/05/2024 by Dr. Edie -patient with increased stiffness as well as increase scar tissue and tenderness that can increase to 5/10 especially with pressure on the DIP scar during 3 point and 2 point precision pinch.  Tenderness of the scar as well as thick scar tissue over the A1 pulley of the second and lateral DIP of second.  Patient is left-hand dominant.  NOW patient made great progress from start of care.  Patient active range of motion within normal limits but continues to feel stiffness tightness because of scar tissue and edema.  Remind patient again he is less than 12 weeks postsurgery.  Patient motion improved greatly from start of care as well as prehension strength  and pinches.  Patient to continue with scar mobilization as well as tendon  glides without causing lingering pain and edema.  Patient can continue with home program for 3 weeks.  Only thing that patient still has difficulty with is playing with a pick guitar and stiffness tightness in the morning.  Patient can benefit from skilled OT services to decrease edema ,scar tissue and pain and increase motion and strength to return to prior level of function.  PERFORMANCE DEFICITS: in functional skills including ADLs, IADLs, ROM, strength, pain, flexibility, decreased knowledge of use of DME, and UE functional use,   and psychosocial skills including environmental adaptation and routines and behaviors.   IMPAIRMENTS: are limiting patient from ADLs, IADLs, rest and sleep, play, leisure, and social participation.   COMORBIDITIES: has no other co-morbidities that affects occupational performance. Patient will benefit from skilled OT to address above impairments and improve overall function.  MODIFICATION OR ASSISTANCE TO COMPLETE EVALUATION: No modification of tasks or assist necessary to complete an evaluation.  OT OCCUPATIONAL PROFILE AND HISTORY: Problem focused assessment: Including review of records relating to presenting problem.  CLINICAL DECISION MAKING: LOW - limited treatment options, no task modification necessary  REHAB POTENTIAL: Good for goals  EVALUATION COMPLEXITY: Low    PLAN:  OT FREQUENCY: 1x/week  OT DURATION: 6 weeks  PLANNED INTERVENTIONS: 97168 OT Re-evaluation, 97535 self care/ADL training, 02889 therapeutic exercise, 97530 therapeutic activity, 97112 neuromuscular re-education, 97140 manual therapy, 97035 ultrasound, 97018 paraffin, 02960 fluidotherapy, 97034 contrast bath, 97760 Orthotic Initial, [id number] Orthotic/Prosthetic subsequent, scar mobilization, passive range of motion, patient/family education, and DME and/or AE instructions    CONSULTED AND AGREED WITH PLAN OF CARE: Patient     Ancel Peters, OTR/L,CLT 07/01/2024, 8:28 AM

## 2024-07-11 ENCOUNTER — Ambulatory Visit: Admitting: Occupational Therapy

## 2024-07-18 ENCOUNTER — Ambulatory Visit: Attending: Student | Admitting: Occupational Therapy

## 2024-07-18 DIAGNOSIS — M79641 Pain in right hand: Secondary | ICD-10-CM | POA: Insufficient documentation

## 2024-07-18 DIAGNOSIS — L905 Scar conditions and fibrosis of skin: Secondary | ICD-10-CM | POA: Insufficient documentation

## 2024-07-18 DIAGNOSIS — M25641 Stiffness of right hand, not elsewhere classified: Secondary | ICD-10-CM | POA: Insufficient documentation

## 2024-07-18 DIAGNOSIS — M6281 Muscle weakness (generalized): Secondary | ICD-10-CM | POA: Insufficient documentation

## 2024-07-18 NOTE — Therapy (Signed)
 OUTPATIENT OCCUPATIONAL THERAPY ORTHO TREATMENT/RECERT/DISCHARGE  Patient Name: Marcus Huang MRN: [id number] DOB:[birth year], 73 y.o., male Today's Date: 07/18/2024  PCP: Dr Kip MART PROVIDER: Kip PA  END OF SESSION:  OT End of Session - 07/18/24 0818     Visit Number 6    Number of Visits 6    Date for Recertification  07/18/24    OT Start Time 9180    Activity Tolerance Patient tolerated treatment well    Behavior During Therapy [facility] for tasks assessed/performed          Past Medical History:  Diagnosis Date   Carotid artery thrombosis    Left Carotid Artery   Coronary artery disease    Diabetes (HCC)    Diverticulosis    History of kidney stones    Hyperlipidemia    Hypertension    Renal mass    Sleep apnea    C-PAP   Past Surgical History:  Procedure Laterality Date   COLONOSCOPY WITH PROPOFOL  N/A 08/15/2019   Procedure: COLONOSCOPY WITH PROPOFOL ;  Surgeon: Jinny Carmine, MD;  Location: [facility];  Service: Endoscopy;  Laterality: N/A;  sleep apnea   CORONARY ARTERY BYPASS GRAFT N/A 12/10/2023   Procedure: CORONARY ARTERY BYPASS GRAFTING (CABG) xTHREE USING LEFT INTERNAL MAMMARY ARTERY AND ENDOSCOPICALLY HARVESTED RIGHT GREATER SAPPHENOUS VEIN;  Surgeon: Lucas Dorise POUR, MD;  Location: [facility] OR;  Service: Open Heart Surgery;  Laterality: N/A;   CORONARY STENT INTERVENTION N/A 08/05/2022   Procedure: CORONARY STENT INTERVENTION;  Surgeon: Ammon Blunt, MD;  Location: [facility] INV[facility]IVE CV [facility];  Service: Cardiovascular;  Laterality: N/A;   CORONARY STENT INTERVENTION Left 08/28/2022   Procedure: CORONARY STENT INTERVENTION;  Surgeon: Ammon Blunt, MD;  Location: [facility] INV[facility]IVE CV LAB;  Service: Cardiovascular;  Laterality: Left;   EYE SURGERY Bilateral    LASIK EYE SURGERY   INCISION AND DRAINAGE OF WOUND Right 05/05/2024   Procedure: IRRIGATION AND DEBRIDEMENT WOUND RIGHT INDEX FINGER, EXPLORATION WITH IRRIGATION AND DEBRIDEMENT OF FLEXOR  SHEATH WITH RELEASE OF A-1 PULLEY;  Surgeon: Edie Norleen PARAS, MD;  Location: [facility] ORS;  Service: Orthopedics;  Laterality: Right;  flexor sheath right index   INGUINAL HERNIA REPAIR Bilateral 05/12/2017   Procedure: LAPAROSCOPIC BILATERAL INGUINAL HERNIA REPAIR, umbilical hernia repair;  Surgeon: Wonda Charlie BRAVO, MD;  Location: [facility] ORS;  Service: General;  Laterality: Bilateral;   INTRAOPERATIVE TRANSESOPHAGEAL ECHOCARDIOGRAM N/A 12/10/2023   Procedure: ECHOCARDIOGRAM, TRANSESOPHAGEAL, INTRAOPERATIVE;  Surgeon: Lucas Dorise POUR, MD;  Location: [facility] OR;  Service: Open Heart Surgery;  Laterality: N/A;   LEFT HEART CATH AND CORONARY ANGIOGRAPHY N/A 08/05/2022   Procedure: LEFT HEART CATH AND CORONARY ANGIOGRAPHY;  Surgeon: Ammon Blunt, MD;  Location: [facility] INV[facility] CV [facility];  Service: Cardiovascular;  Laterality: N/A;   LEFT HEART CATH AND CORONARY ANGIOGRAPHY Left 11/11/2023   Procedure: LEFT HEART CATH AND CORONARY ANGIOGRAPHY;  Surgeon: Ammon Blunt, MD;  Location: [facility] INV[facility] CV [facility];  Service: Cardiovascular;  Laterality: Left;   TONSILLECTOMY     As a child   Patient Active Problem List   Diagnosis Date Noted   Cellulitis of finger of right hand 05/05/2024   Cellulitis 05/05/2024   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   Atherosclerosis of native arteries of extremity with intermittent claudication 10/12/2022   Lymphedema 08/24/2022   Raynaud's disease 08/24/2022   PAD (peripheral artery disease) 08/24/2022   S/P drug eluting coronary stent placement 08/15/2022   CAD (coronary artery disease) 08/05/2022   Screen for colon cancer 03/14/2020  Special screening for malignant neoplasms, colon    Peyronie's disease 07/16/2019   Renal mass 07/10/2019   Heart palpitations 06/27/2019   Carotid artery stenosis 06/02/2019   Umbilical hernia without obstruction and without gangrene 06/02/2019   Aortic atherosclerosis 06/02/2019   Hypertensive urgency 05/31/2019   Non-recurrent bilateral  inguinal hernia without obstruction or gangrene    Diabetes type 2, uncontrolled 02/14/2014   Hyperlipidemia, unspecified 02/14/2014   Malignant hypertension 02/14/2014   Type 2 diabetes mellitus without complication, without long-term current use of insulin  (HCC) 02/14/2014   Hyperlipidemia 02/14/2014   Hypertension, essential, benign 02/14/2014    ONSET DATE: 05/05/24  REFERRING DIAG: R hand cellulitis with I&D 2nd digit and palm   THERAPY DIAG:  Stiffness of right hand, not elsewhere classified  Scar tissue  Pain in right hand  Muscle weakness (generalized)  Rationale for Evaluation and Treatment: Rehabilitation  SUBJECTIVE:   SUBJECTIVE STATEMENT: I am doing much better than 2 wks ago -my fingertip.  I can play the guitar now.  And the tenderness and the motion is better.  He is just stiff in the morning. Pt accompanied by: self  PERTINENT HISTORY: 05/17/24 Ortho not :  The patient presents today with a dressing applied to the right hand. Dressing was removed at today's visit. Skin examination of the right hand demonstrates a well-healing incision at the base of the finger on where the entire A1 pulley was released. Sutures are intact without any evidence of loosening. The sutures were removed today without complication. Benzoin and Steri-Strips were applied. The patient has a well-healed stab type incision along the radial aspect of the distal phalanx without any erythema. The patient does still have some mild swelling throughout the finger however wrinkles are beginning to appear. He denies any significant tenderness to palpation along the volar or dorsal aspect of the finger at this time. He is able to fully extend the finger, limitations with flexion due to the continued swelling but no pain with passive flexion or extension of the finger. There is no purulent drainage coming from either incision site. He is intact to light touch, he does report some slight loss of sensation along  the distal aspect of the radial portion of the distal phalanx. Cap refills intact to each individual digit. Radial or pulse are intact at today's visit. A Band-Aid was applied over the stab incision along the radial aspect of this callus.  Imaging: None.  Impression: Suppurative tenosynovitis of flexor tendon of right hand [M65.141] Suppurative tenosynovitis of flexor tendon of right hand (primary encounter diagnosis) Infected foreign body of finger of right hand, sequela Felon of finger of right hand with lymphangitis  Plan:  1. Treatment options were discussed today with the patient. 2. Continue with doxycycline as prescribed. 3. The patient was instructed that he may begin to get the finger wet over the A1 pulley incision site. Sutures were removed at today's visit and benzoin and Steri-Strips were applied. He was instructed to keep the stab incision dry until Friday. 4. Begin to massage to the index finger and work on continued range of motion. Gradually increase activities as tolerated. Referral for occupational therapy was placed with the patient. 5. The patient will follow up with me in 2 weeks for repeat skin check of the right hand. 6. They can call the clinic they have any questions, new symptoms develop or symptoms worsen.   PRECAUTIONS: None     WEIGHT BEARING RESTRICTIONS: No  PAIN:  Are you  having pain?  Denies pain more stiffness  FALLS: Has patient fallen in last 6 months? No  LIVING ENVIRONMENT: Lives with: Live with wife    PLOF: Had normal active range of motion and strength and right dominant hand.  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  PATIENT GOALS: Want the pain and scar tissue better as well as more motion to return to normal activity  NEXT MD VISIT: This week   OBJECTIVE:  Note: Objective measures were completed at Evaluation unless otherwise noted.  HAND DOMINANCE:  Left  ADLs: Cannot grip tight objects as well as pinching  FUNCTIONAL OUTCOME MEASURES: Next session  UPPER EXTREMITY ROM:     Active ROM Right eval Left eval  Shoulder flexion    Shoulder abduction    Shoulder adduction    Shoulder extension    Shoulder internal rotation    Shoulder external rotation    Elbow flexion    Elbow extension    Wrist flexion    Wrist extension    Wrist ulnar deviation    Wrist radial deviation    Wrist pronation    Wrist supination    (Blank rows = not tested)  Active ROM Right eval Left eval R 05/06/24 R 07/01/24 R 07/18/24  Thumb MCP (0-60)       Thumb IP (0-80)       Thumb Radial abd/add (0-55)        Thumb Palmar abd/add (0-45)        Thumb Opposition to Small Finger        Index MCP (0-90) 80  85 80 80 85  Index PIP (0-100) 90  100 95 100 95  Index DIP (0-70)  60  75 70 70 70  Long MCP (0-90)         Long PIP (0-100)         Long DIP (0-70)         Ring MCP (0-90)         Ring PIP (0-100)         Ring DIP (0-70)         Little MCP (0-90)         Little PIP (0-100)         Little DIP (0-70)         (Blank rows = not tested)  )  HAND FUNCTION: Grip strength: Right:   lbs; Left:   lbs, Lateral pinch: Right: 21 lbs, Left: 27 lbs, and 3 point pinch: Right: 11 lbs, Left: 21 lbs 2 point pinch R 7 lbs and L 16 lbs  06/06/24 Grip strength: Right:   lbs; Left:   lbs, Lateral pinch: Right: 21 lbs, Left: 27 lbs, and 3 point pinch: Right: 16 lbs, Left: 21 lbs; 2 point pinch R 11 lbs and L 16 lbs 07/01/24 Grip strength: Right:   lbs; Left:   lbs, Lateral pinch: Right: 23 lbs, Left: 27 lbs, and 3 point pinch: Right: 16 lbs, Left: 21 lbs; 2 point pinch R 13 lbs and L 16 lbs 07/18/24 Grip strength: Right: 76  lbs; Left: 80  lbs, Lateral pinch: Right: 22 lbs, Left: 25 lbs, and 3 point pinch: Right: 20 lbs, Left: 21 lbs; 2 point pinch R 13 lbs and L 16 lbs COORDINATION: Decrease - tenderness at DIP of 2nd with pinching   SENSATION: Some  numbness around incision at lateral DIP  EDEMA: proximal phalanges-R 6.9 cm and L 6.5 cm  COGNITION: Overall cognitive status: Within functional limits for tasks assessed      TREATMENT DATE: 07/18/24                                                                                                                            Patient arrived with less tenderness and pain on the lateral DIP scar.  Denies pain.  Reports increased use and less discomfort. Reports more strength. Palmar scar still a little thick but responding great to treatment.  Not limiting motion. Assess range of motion.  As well as grip and prehension. Great improvement in 3-point pinch.  And patient report increased strength and stability with 2 point precision pinch. See flowsheet.  Modalities: Paraffin Time: Location: R wrist and hand  Decrease stiffness and decrease scar tissue prior to soft tissue mobilization  Scar mobilization done by OT focusing on palmar scar with mini massager.  Response great. Provided patient with silicone sleeve for compression and scar at DIP to wear for another 2 weeks. Patient can continue with tendon glides not forcing second MC to 90.  Normal range compared to the left is 80 degrees Followed by [facility] flexion with PIP extension 10 reps Intrinsic a fist- DIP/PIP flexion 10 reps Followed by composite flexion 10 reps Not to force or squeeze forceful  Continue with scar mobilization 3 times a day Can continue with green putty again for gripping and 2-point pinch Continue with putty but not to a point of increased pain and edema.      PATIENT EDUCATION: Education details: findings of eval and HEP  Person educated: Patient Education method: Explanation, Demonstration, Tactile cues, Verbal cues, and Handouts Education comprehension: verbalized understanding, returned demonstration, verbal cues required, and needs further education   GOALS: Goals reviewed with patient?  Yes   LONG TERM GOALS: Target date: 6 wks  Patient be independent in home program to decrease scar tissue and tenderness and pain for patient to tolerate pressure with lateral pinch and 2 point pinch Baseline: 5/10 pain and tenderness over the scar with massage as well as lateral and precision pinch-scar tender and thick Goal status: Met  2.  Right second digit flexion improved to within normal range compared to the left hand symptom-free for patient to play guitar type shoes do buttons Baseline: Patient has MP flexion at 80 PIP 90 and DIP 60 degrees with increased discomfort and tightness.  With pressure use pain can increase to 5/10 Goal status: Met  3.  Right 3-point pinch and 2-point pinch improved to within normal range for patient to do buttons, play guitar open Ziploc bags or packages symptom-free Baseline: 3-point pinch right 11 pounds and left 21 pounds, 2-point pinch right 7 pounds left 16 pounds with discomfort increased to 5/10 Goal status: Met  ASSESSMENT:  CLINICAL IMPRESSION: Patient seen for occupational therapy evaluation for right second digit cellulitis with the irrigation and debridement  on 05/05/2024 by Dr. Edie -patient with increased stiffness as well as increase scar tissue and tenderness that can increase to 5/10 especially with pressure on the DIP scar during 3 point and 2 point precision pinch.  Tenderness of the scar as well as thick scar tissue over the A1 pulley of the second and lateral DIP of second.  Patient is left-hand dominant.  NOW patient made great progress from start of care.  Increase active range of motion in second digit.  Close to normal limits.  Increase grip and prehension strength.  As well as improvement in scar tissue and tenderness.  Patient denies pain more just stiffness in the morning.  Patient able to perform all activities using right hand normally.  Patient discharged with home program.  Met all goals. PERFORMANCE DEFICITS: in functional  skills including ADLs, IADLs, ROM, strength, pain, flexibility, decreased knowledge of use of DME, and UE functional use,   and psychosocial skills including environmental adaptation and routines and behaviors.   IMPAIRMENTS: are limiting patient from ADLs, IADLs, rest and sleep, play, leisure, and social participation.   COMORBIDITIES: has no other co-morbidities that affects occupational performance. Patient will benefit from skilled OT to address above impairments and improve overall function.  MODIFICATION OR ASSISTANCE TO COMPLETE EVALUATION: No modification of tasks or assist necessary to complete an evaluation.  OT OCCUPATIONAL PROFILE AND HISTORY: Problem focused assessment: Including review of records relating to presenting problem.  CLINICAL DECISION MAKING: LOW - limited treatment options, no task modification necessary  REHAB POTENTIAL: Good for goals  EVALUATION COMPLEXITY: Low    PLAN:  OT FREQUENCY: 1x/week  OT DURATION: 1 week  PLANNED INTERVENTIONS: 97168 OT Re-evaluation, 97535 self care/ADL training, 02889 therapeutic exercise, 97530 therapeutic activity, 97112 neuromuscular re-education, 97140 manual therapy, 97035 ultrasound, 97018 paraffin, 02960 fluidotherapy, 97034 contrast bath, 97760 Orthotic Initial, [id number] Orthotic/Prosthetic subsequent, scar mobilization, passive range of motion, patient/family education, and DME and/or AE instructions    CONSULTED AND AGREED WITH PLAN OF CARE: Patient     Ancel Peters, OTR/L,CLT 07/18/2024, 8:19 AM

## 2024-09-21 NOTE — Progress Notes (Signed)
 Marcus Huang                                          MRN: [id number]   09/21/2024   The [facility] Quality Team Specialist reviewed this patient medical record for the purposes of chart review for care gap closure. The following were reviewed: abstraction for care gap closure-glycemic status assessment.    [facility] Quality Team
